# Patient Record
Sex: Male | Born: 1960 | Race: White | Hispanic: No | Marital: Married | Smoking: Never smoker
Health system: Southern US, Community
[De-identification: ages and names within clinical notes are randomized; demographics above are authoritative.]

## PROBLEM LIST (undated history)

## (undated) DIAGNOSIS — M8700 Idiopathic aseptic necrosis of unspecified bone: Secondary | ICD-10-CM

## (undated) DIAGNOSIS — M1990 Unspecified osteoarthritis, unspecified site: Secondary | ICD-10-CM

## (undated) DIAGNOSIS — M47816 Spondylosis without myelopathy or radiculopathy, lumbar region: Secondary | ICD-10-CM

## (undated) DIAGNOSIS — S92002A Unspecified fracture of left calcaneus, initial encounter for closed fracture: Secondary | ICD-10-CM

## (undated) DIAGNOSIS — C801 Malignant (primary) neoplasm, unspecified: Secondary | ICD-10-CM

## (undated) DIAGNOSIS — K56609 Unspecified intestinal obstruction, unspecified as to partial versus complete obstruction: Secondary | ICD-10-CM

## (undated) DIAGNOSIS — K219 Gastro-esophageal reflux disease without esophagitis: Secondary | ICD-10-CM

## (undated) DIAGNOSIS — T7840XA Allergy, unspecified, initial encounter: Secondary | ICD-10-CM

## (undated) HISTORY — PX: VASECTOMY: SHX75

## (undated) HISTORY — PX: HAND SURGERY: SHX662

## (undated) HISTORY — PX: WRIST SURGERY: SHX841

## (undated) HISTORY — DX: Unspecified osteoarthritis, unspecified site: M19.90

## (undated) HISTORY — DX: Allergy, unspecified, initial encounter: T78.40XA

## (undated) HISTORY — PX: FOOT SURGERY: SHX648

---

## 2002-10-10 ENCOUNTER — Encounter: Payer: Self-pay | Admitting: Emergency Medicine

## 2002-10-10 ENCOUNTER — Observation Stay (HOSPITAL_COMMUNITY): Admission: EM | Admit: 2002-10-10 | Discharge: 2002-10-12 | Payer: Self-pay | Admitting: Emergency Medicine

## 2003-09-30 ENCOUNTER — Emergency Department (HOSPITAL_COMMUNITY): Admission: EM | Admit: 2003-09-30 | Discharge: 2003-09-30 | Payer: Self-pay | Admitting: Emergency Medicine

## 2003-11-11 ENCOUNTER — Emergency Department (HOSPITAL_COMMUNITY): Admission: EM | Admit: 2003-11-11 | Discharge: 2003-11-12 | Payer: Self-pay | Admitting: *Deleted

## 2003-11-12 ENCOUNTER — Ambulatory Visit (HOSPITAL_COMMUNITY): Admission: RE | Admit: 2003-11-12 | Discharge: 2003-11-12 | Payer: Self-pay | Admitting: Family Medicine

## 2003-12-03 ENCOUNTER — Inpatient Hospital Stay (HOSPITAL_COMMUNITY): Admission: EM | Admit: 2003-12-03 | Discharge: 2003-12-05 | Payer: Self-pay | Admitting: Internal Medicine

## 2003-12-10 ENCOUNTER — Ambulatory Visit (HOSPITAL_COMMUNITY): Admission: RE | Admit: 2003-12-10 | Discharge: 2003-12-10 | Payer: Self-pay | Admitting: Orthopaedic Surgery

## 2005-07-25 ENCOUNTER — Emergency Department (HOSPITAL_COMMUNITY): Admission: EM | Admit: 2005-07-25 | Discharge: 2005-07-25 | Payer: Self-pay | Admitting: Emergency Medicine

## 2006-05-11 ENCOUNTER — Ambulatory Visit (HOSPITAL_COMMUNITY): Admission: RE | Admit: 2006-05-11 | Discharge: 2006-05-11 | Payer: Self-pay | Admitting: Family Medicine

## 2006-08-20 ENCOUNTER — Inpatient Hospital Stay (HOSPITAL_COMMUNITY): Admission: AD | Admit: 2006-08-20 | Discharge: 2006-08-23 | Payer: Self-pay | Admitting: Family Medicine

## 2007-05-22 ENCOUNTER — Ambulatory Visit (HOSPITAL_COMMUNITY): Admission: RE | Admit: 2007-05-22 | Discharge: 2007-05-22 | Payer: Self-pay | Admitting: Family Medicine

## 2007-07-13 ENCOUNTER — Ambulatory Visit (HOSPITAL_COMMUNITY): Admission: RE | Admit: 2007-07-13 | Discharge: 2007-07-13 | Payer: Self-pay | Admitting: Family Medicine

## 2008-07-03 ENCOUNTER — Ambulatory Visit (HOSPITAL_BASED_OUTPATIENT_CLINIC_OR_DEPARTMENT_OTHER): Admission: RE | Admit: 2008-07-03 | Discharge: 2008-07-04 | Payer: Self-pay | Admitting: *Deleted

## 2009-01-20 ENCOUNTER — Ambulatory Visit (HOSPITAL_COMMUNITY): Admission: RE | Admit: 2009-01-20 | Discharge: 2009-01-20 | Payer: Self-pay | Admitting: Family Medicine

## 2010-01-03 ENCOUNTER — Observation Stay (HOSPITAL_COMMUNITY): Admission: EM | Admit: 2010-01-03 | Discharge: 2010-01-05 | Payer: Self-pay | Admitting: Emergency Medicine

## 2010-06-22 ENCOUNTER — Ambulatory Visit (HOSPITAL_COMMUNITY): Admission: RE | Admit: 2010-06-22 | Discharge: 2010-06-22 | Payer: Self-pay | Admitting: Family Medicine

## 2010-07-06 ENCOUNTER — Ambulatory Visit (HOSPITAL_COMMUNITY): Admission: RE | Admit: 2010-07-06 | Discharge: 2010-07-06 | Payer: Self-pay | Admitting: Family Medicine

## 2010-08-07 ENCOUNTER — Encounter: Admission: RE | Admit: 2010-08-07 | Discharge: 2010-08-07 | Payer: Self-pay | Admitting: Family Medicine

## 2010-09-01 ENCOUNTER — Emergency Department (HOSPITAL_COMMUNITY): Admission: EM | Admit: 2010-09-01 | Discharge: 2010-09-02 | Payer: Self-pay | Admitting: Emergency Medicine

## 2010-09-02 ENCOUNTER — Ambulatory Visit (HOSPITAL_COMMUNITY): Admission: RE | Admit: 2010-09-02 | Discharge: 2010-09-02 | Payer: Self-pay | Admitting: Internal Medicine

## 2010-09-02 ENCOUNTER — Ambulatory Visit: Payer: Self-pay | Admitting: Internal Medicine

## 2010-09-08 ENCOUNTER — Encounter: Payer: Self-pay | Admitting: Internal Medicine

## 2010-09-10 ENCOUNTER — Encounter: Payer: Self-pay | Admitting: Internal Medicine

## 2010-09-11 ENCOUNTER — Encounter: Payer: Self-pay | Admitting: Internal Medicine

## 2010-09-29 ENCOUNTER — Ambulatory Visit (HOSPITAL_COMMUNITY): Admission: RE | Admit: 2010-09-29 | Discharge: 2010-09-29 | Payer: Self-pay | Admitting: Internal Medicine

## 2010-09-29 ENCOUNTER — Ambulatory Visit: Payer: Self-pay | Admitting: Internal Medicine

## 2010-10-04 ENCOUNTER — Encounter: Payer: Self-pay | Admitting: Internal Medicine

## 2011-01-05 NOTE — Letter (Signed)
Summary: Patient Notice, Colon Biopsy Results  [facility]  [address]   [place], [place] [address]   Phone: [phone]  Fax: [phone]       October 04, 2010   [name] [address] [place], [place]  [address] Aug 23, 1961    Dear [name],  I am pleased to inform you that the biopsies taken during your recent colonoscopy did not show any evidence of cancer upon pathologic examination.  Additional information/recommendations:  You should have a repeat colonoscopy examination  in 5 years.  Please call us if you are having persistent problems or have questions about your condition that have not been fully answered at this time.  Sincerely,    R. Roetta Sessions MD, FACP [facility] Ph: [phone]    Fax: [phone]   Appended Document: Patient Notice, Colon Biopsy Results Letter mailed to pt.  Appended Document: Patient Notice, Colon Biopsy Results reminder in computer

## 2011-01-05 NOTE — Letter (Signed)
Summary: TRIAGE ORDER  TRIAGE ORDER   Imported By: Ave Filter 09/11/2010 12:43:33  _____________________________________________________________________  External Attachment:    Type:   Image     Comment:   External Document

## 2011-01-05 NOTE — Letter (Signed)
Summary: Internal Other Domingo Dimes  Internal Other Domingo Dimes   Imported By: Cloria Spring LPN 09/81/1914 78:29:56  _____________________________________________________________________  External Attachment:    Type:   Image     Comment:   External Document

## 2011-01-05 NOTE — Letter (Signed)
Summary: Patient Notice, Endo Biopsy Results  [facility]  [address]   [place], [place] [address]   Phone: [phone]  Fax: [phone]       September 10, 2010   [name] [address] [place], [place]  [address] [birth year]    Dear [name],  I am pleased to inform you that the biopsies taken during your recent endoscopic examination did not show any evidence of cancer, inflammation or infection upon pathologic examination.  Additional information/recommendations:  We will plan for a colonoscopy in the next few weeks.  Please call us if you are having persistent problems or have questions about your condition that have not been fully answered at this time.  Sincerely,    R. Roetta Sessions MD, FACP [facility] Ph: [phone]   Fax: [phone]   Appended Document: Patient Notice, Endo Biopsy Results Lm for pt to call back to schedule tcs.

## 2011-02-18 LAB — URINALYSIS, ROUTINE W REFLEX MICROSCOPIC
Nitrite: NEGATIVE
Specific Gravity, Urine: 1.02 (ref 1.005–1.030)
Urobilinogen, UA: 0.2 mg/dL (ref 0.0–1.0)

## 2011-02-18 LAB — URINE MICROSCOPIC-ADD ON

## 2011-02-18 LAB — BASIC METABOLIC PANEL
BUN: 10 mg/dL (ref 6–23)
CO2: 29 mEq/L (ref 19–32)
Calcium: 9.2 mg/dL (ref 8.4–10.5)
Chloride: 102 mEq/L (ref 96–112)
Creatinine, Ser: 0.94 mg/dL (ref 0.4–1.5)
GFR calc Af Amer: >60 mL/min (ref >60)
GFR calc non Af Amer: >60 mL/min (ref >60)
Glucose, Bld: 143 mg/dL — ABNORMAL HIGH (ref 70–99)
Potassium: 2.8 mEq/L — ABNORMAL LOW (ref 3.5–5.1)
Sodium: 140 mEq/L (ref 135–145)

## 2011-02-18 LAB — CBC
HCT: 49.6 % (ref 39.0–52.0)
Hemoglobin: 17 g/dL (ref 13.0–17.0)
MCH: 33.7 pg (ref 26.0–34.0)
MCHC: 34.2 g/dL (ref 30.0–36.0)
MCV: 98.4 fL (ref 78.0–100.0)
Platelets: 266 10*3/uL (ref 150–400)
RBC: 5.04 MIL/uL (ref 4.22–5.81)
RDW: 12.8 % (ref 11.5–15.5)
WBC: 11.5 10*3/uL — ABNORMAL HIGH (ref 4.0–10.5)

## 2011-02-18 LAB — DIFFERENTIAL
Basophils Absolute: 0.1 10*3/uL (ref 0.0–0.1)
Lymphocytes Relative: 13 % (ref 12–46)
Neutro Abs: 9.1 10*3/uL — ABNORMAL HIGH (ref 1.7–7.7)

## 2011-02-18 LAB — LIPASE, BLOOD: Lipase: 28 U/L (ref 11–59)

## 2011-02-21 LAB — DIFFERENTIAL
Basophils Absolute: 0.1 10*3/uL (ref 0.0–0.1)
Eosinophils Relative: 1 % (ref 0–5)
Lymphocytes Relative: 24 % (ref 12–46)

## 2011-02-21 LAB — CBC
HCT: 46.9 % (ref 39.0–52.0)
Platelets: 286 10*3/uL (ref 150–400)
RDW: 12.6 % (ref 11.5–15.5)

## 2011-02-21 LAB — BASIC METABOLIC PANEL
BUN: 11 mg/dL (ref 6–23)
Calcium: 9.3 mg/dL (ref 8.4–10.5)
GFR calc non Af Amer: >60 mL/min (ref >60)
Glucose, Bld: 108 mg/dL — ABNORMAL HIGH (ref 70–99)
Potassium: 4.2 mEq/L (ref 3.5–5.1)

## 2011-04-20 NOTE — Op Note (Signed)
NAME:  KIREN, MCISAAC NO.:  [id number]   MEDICAL RECORD NO.:  [id number]          PATIENT TYPE:  AMB   LOCATION:  DSC                          FACILITY:  [facility]   PHYSICIAN:  Tennis Must Meyerdierks, M.D.DATE OF BIRTH:  1961/08/26   DATE OF PROCEDURE:  07/03/2008  DATE OF DISCHARGE:                               OPERATIVE REPORT   PREOPERATIVE DIAGNOSIS:  Fracture, right distal radius.   POSTOPERATIVE DIAGNOSIS:  Fracture, right distal radius.   PROCEDURE:  Open reduction and internal fixation, right distal radius  fracture.   SURGEON:  Lowell Bouton, MD.   ANESTHESIA:  Axillary block.   OPERATIVE FINDINGS:  The patient had a comminuted distal radius fracture  that did not appear to extend into the joint.   PROCEDURE:  Under axillary block anesthesia with a tourniquet on the  right arm, the right hand was prepped and draped in the usual fashion.  After  exsanguinating the limb, the tourniquet was inflated to 250 mmHg.  Longitudinal traction of 10 pounds was applied across the end of the arm  board using finger traps on the index and long fingers.  A longitudinal  incision was made over the volar aspect of the distal forearm in line  with the FCR tendon.  It was carried down through the subcutaneous  tissues, and bleeding points were coagulated.  Sharp dissection was  carried down to the FCR sheath and this was incised with a scissors.  The FCR tendon was then retracted ulnarly, and the floor of the sheath  was incised with the scissors.  Blunt dissection was carried down just  radial to the FPL and this was retracted ulnarly.  The pronator  quadratus was identified and was sharply released from its attachment to  the radius.  A Freer elevator was used to elevate the pronator quadratus  and to identify the fracture site.  Weitlaner retractors were inserted.  Care was taken to protect the median nerve.  The fracture site was then  explored, and  there was muscle in the fracture site.  This was removed  with a rongeur.  A Freer elevator was used to elevate the fracture site  and cleaned it out with irrigation.  The fracture was then reduced and  held in position with the elevator while a DVR plate, medium length was  inserted.  A right-sided plate was placed and the 3.5 mm sliding screw  was inserted.  X-rays showed good position of the plate and the  fracture.  Multiple threaded and non-threaded pegs were inserted  distally using a total of 7 pegs.  Another cortical screw was placed  proximally and did not have good cortical fixation dorsally, so it was  removed.  Two more screws were inserted proximally, so there were 3  cortical screws in the longitudinal portion of the plate and 7 pegs in  the transverse buttress portion of the plate.  X-rays showed good  alignment and traction had been released prior to inserting all the  screws.  The wound was irrigated copiously with saline.  The pronator  quadratus  was reattached with 4-0 Vicryl.  A TLS drain was inserted for  drainage.  Subcutaneous tissue was closed with 4-0 Vicryl.  Skin was closed with 3-  0 subcuticular Prolene.  Steri-Strips were applied followed by sterile  dressings and volar wrist splint.  Tourniquet was released with good  circulation of the hand.  The patient went to the recovery room awake  and stable and in good condition.      Lowell Bouton, M.D.  Electronically Signed     EMM/MEDQ  D:  07/03/2008  T:  07/04/2008  Job:  [id number]   cc:   Teola Bradley, M.D.

## 2011-04-23 NOTE — H&P (Signed)
NAME:  [name], [name]                         ACCOUNT NO.:  [id number]   MEDICAL RECORD NO.:  [id number]                   PATIENT TYPE:  EMS   LOCATION:  ED                                   FACILITY:  [facility]   PHYSICIAN:  Burnice Logan, M.D.               DATE OF BIRTH:  01/11/1961   DATE OF ADMISSION:  10/10/2002  DATE OF DISCHARGE:                                HISTORY & PHYSICAL   CHIEF COMPLAINT:  Chest pain.   HISTORY OF PRESENT ILLNESS:  This is a 50 year old white gentleman who  started experiencing precordial chest pain at about 12 noon today.  The pain  was associated with shortness of breath and diaphoresis.  The pain lingered  until about 9 p.m. or 10 p.m. this evening.  The paramedics were called to  bring him to the emergency room and he received nitro in the ambulance en  route to the emergency room.  In the emergency room the patient was given a  shot of morphine and shortly after was given a dose of Benadryl because he  reported previous allergic reaction to MORPHINE.  He currently is pain free  and has no complaints.   PAST MEDICAL HISTORY:  Gastroesophageal reflux disease.   CURRENT MEDICATIONS:  Prevacid 30 mg p.o. q.d.   MEDICATION ALLERGIES:  MORPHINE and IBUPROFEN.  He breaks out in hives when  he takes these medications.   FAMILY HISTORY:  The patient's sister is due for open heart surgery.  His  daddy has a heart murmur.  There are no premature deaths from coronary  artery disease in the family.   SOCIAL HISTORY:  The patient is single and works in a [employer].  He does  not smoke and does not drink alcohol.   REVIEW OF SYSTEMS:  The patient stated that he has been well and healthy and  has not seen his primary doctor in a while.  He does not know his  cholesterol status.  He denies any previous surgeries.  He has had no fever  or chills.  Denies any weight loss.  NEUROLOGIC:  Has no headaches, no  visual disturbances.  ENT:  No sore throat,  no earache.  CARDIORESPIRATORY:  Chest pain as stated above.  The patient also has occasional nonproductive  cough.  GASTROINTESTINAL:  No abdominal pain, nausea, or vomiting.  GENITOURINARY:  No dysuria or hematuria.  ENDOCRINE:  Negative for diabetes  mellitus.  MUSCULOSKELETAL:  No complaints.  All other systems reviewed are  negative.   PHYSICAL EXAMINATION:  GENERAL:  This is a young white gentleman of average  build in no acute distress.  VITAL SIGNS:  Temperature 98.2, blood pressure 119/70, heart rate 89 beats  per minute, respiratory rate 20 per minute, O2 saturation 97% on room air.  HEENT:  Normocephalic and atraumatic.  Pupils are round and equal in size,  reactive to light.  Oropharynx exam is unremarkable.  NECK:  Supple.  No distended veins.  LUNGS:  Clear to auscultation.  Breathing is unlabored.  CARDIOVASCULAR:  Heart sounds 1 and 2 regular.  ABDOMEN:  Soft and nontender, normal bowel sounds.  NEUROLOGIC:  No focal deficits.  Cranial nerves II-XII are normal.  EXTREMITIES:  No pedal edema.   LABORATORY DATA:  EKG:  Sinus rhythm at 87 beats per minute; no ST segment  changes.   CK 64, MB fraction 1.6, troponin less than 0.01.  Hemoglobin 15.8,  hematocrit 44.6.  Potassium 3.2, BUN 6, creatinine 0.9.   ASSESSMENT AND PLAN:  Chest pain.  The patient will be admitted to the  telemetry unit for observation.  He will have serial cardiac enzymes done.  Will consult cardiology to schedule a stress test for tomorrow morning.  The  patient will have a fasting lipid panel checked and he will get potassium  supplementation for his mild hypokalemia.   Dr. Renard Matter will assume care of the patient in the morning.                                                Burnice Logan, M.D.    ES/MEDQ  D:  10/11/2002  T:  10/11/2002  Job:  [id number]

## 2011-04-23 NOTE — Discharge Summary (Signed)
NAME:  OLSEN, MCCUTCHAN               ACCOUNT NO.:  [id number]   MEDICAL RECORD NO.:  [id number]          PATIENT TYPE:  INP   LOCATION:  A330                          FACILITY:  [facility]   PHYSICIAN:  Angus G. Renard Matter, MD   DATE OF BIRTH:  1961/05/18   DATE OF ADMISSION:  08/19/2006  DATE OF DISCHARGE:  09/18/2007LH                                 DISCHARGE SUMMARY   DIAGNOSIS:  Small bowel obstruction, improved at the time of discharge.   This 50 year old white male presented to the office on the day of admission  with chief complaint being severe epigastric and upper abdominal pain  accompanied by episodes of vomiting and nausea.  He stated this had been  present since the previous evening and was rated at 8 to 10 in severity.  He  does have a history of hiatal hernia. He was given Mepergan in the office 2  mL and admitted to the hospital for further evaluation.   PHYSICAL EXAMINATION:  An uncomfortable white male complaining of abdominal  pain.  VITAL SIGNS:  Blood pressure 120/80, pulse 60, respirations 18, temperature  98.  HEENT: Eyes: PERRLA.  TM's negative.  Oropharynx benign.  NECK:  Supple.  No JVD or thyroid abnormalities.  HEART:  Regular rhythm.  No murmurs.  LUNGS:  Clear to P&A.  ABDOMEN:  Tenderness in epigastrium.  No palpable organs or masses.  EXTREMITIES:  Free of edema.   LABORATORY DATA:  Admission CBC; WBC 13,600 with hemoglobin 17.4, crit 50.3.  Subsequent CBC on August 21, 2006; WBC 11,100 with hemoglobin 16.8,  hematocrit 47.7.  Chemistries; sodium 138, potassium 3.7, chloride 100, CO2  28, glucose 107, BUN 10, creatinine 0.8, calcium 9.0.  Subsequent  chemistries on August 21, 2006; sodium 140, potassium 4.3, chloride 104,  CO2 31, glucose 111, BUN 10, creatinine one, calcium 9.0.  Liver enzymes;  SGOT 28, SGPT 48, alkaline phosphatase 74, bilirubin 1.3, amylase 60, lipase  17.  Urinalysis negative.   X-RAYS:  CT of pelvis:  Distended loops of small  bowel in the upper pelvis  compatible with small-bowel obstruction.  Chest x-ray and abdominal film;  atelectasis and scarring of right lung, chronic bronchitic changes, air  filled loops of small bowel in mid abdomen.  Electrocardiogram; normal sinus  rhythm, normal EKG.   HOSPITAL COURSE:  The patient at the time of his admission and was  complaining of severe epigastric pain.  He had x-ray evidence of small-bowel  obstruction.  He was placed on intravenous fluids, half-normal saline at 100  mL an hour.  Vital signs were monitored q.i.d. [facility] standing orders. IV  Protonix was started 40 mg every 12 hours.  He was given IV Dilaudid 1-2 mg  every 3-4 hours p.r.n. for pain, IV Zofran 4 mg every 8 hours p.r.n. for  nausea and started on Levbid one b.i.d.  He was seen in consultation by Dr.  Lovell Sheehan. NG tube was inserted to moderate suction.  Levbid was discontinued.  The patient gradually improved with conservative treatment.  Ice chips were  started.  His diet was progressed.  His x-rays improved and the patient  improved.  His diet was advanced.  Dr. Lovell Sheehan did not  feel that he needed surgical intervention.  The patient gradually improved  during his hospital stay which was 3 days and he was able be discharged on  the third day to be followed as an outpatient.  He was instructed to  continue Prevacid 30 mg b.i.d. and Maalox 1 tablespoon p.r.n. and instructed  to return to the physician's office for follow-up.      Angus G. Renard Matter, MD  Electronically Signed     AGM/MEDQ  D:  09/26/2006  T:  09/26/2006  Job:  [id number]

## 2011-04-23 NOTE — Group Therapy Note (Signed)
NAME:  [name], [name]               ACCOUNT NO.:  [id number]   MEDICAL RECORD NO.:  [id number]          PATIENT TYPE:  INP   LOCATION:  A330                          FACILITY:  [facility]   PHYSICIAN:  Angus G. Renard Matter, MD   DATE OF BIRTH:  1961/05/28   DATE OF PROCEDURE:  DATE OF DISCHARGE:                                   PROGRESS NOTE   This patient was admitted with severe epigastric and upper abdominal pain.  He had evidence of small bowel obstruction by CT with distended loops of  small bowel at upper pelvis.  Nondistended colon and lower small bowel.  He  remains on IV fluids.  He remains on NG suction.   OBJECTIVE:  VITAL SIGNS:  Blood pressure 124/82, respirations 20, pulse 88,  temp 98;.2.  Lab shows white count 11,100.  Chemistry is sodium 140, potassium 4.3,  chloride 104, glucose 111, BUN 10, creatinine 1.  LUNGS:  Clear.  HEART:  Regular rhythm.  ABDOMEN:  Slight tenderness in upper abdomen.   ASSESSMENT:  Patient was admitted with a small bowel obstruction.  Plan to  continue current regimen.  Patient will be seen again today by surgery, Dr.  Lovell Sheehan, who will decided if NG suction should be discontinued.      Angus G. Renard Matter, MD  Electronically Signed     AGM/MEDQ  D:  08/21/2006  T:  08/22/2006  Job:  [id number]

## 2011-04-23 NOTE — Group Therapy Note (Signed)
   NAME:  [name], [name] NO.:  [id number]   MEDICAL RECORD NO.:  [id number]                  PATIENT TYPE:   LOCATION:                                       FACILITY:   PHYSICIAN:  Angus G. Renard Matter, M.D.              DATE OF BIRTH:   DATE OF PROCEDURE:  10/11/2002  DATE OF DISCHARGE:                                   PROGRESS NOTE   SUBJECTIVE:  This patient was admitted with left chest pain.  His cardiac  enzymes today have been normal.   OBJECTIVE:  VITAL SIGNS: Blood pressure 110/59, respirations 24, pulse 73,  temperature 98.  LUNGS: Clear to percussion and auscultation.  HEART:  Regular rhythm.  ABDOMEN: No palpable organs or masses.   ASSESSMENT:  The patient was admitted with left chest pain, to rule out  coronary artery disease.   PLAN:  Plan to obtain cardiology consult today for possible stress test.                                               Angus G. Renard Matter, M.D.    AGM/MEDQ  D:  10/11/2002  T:  10/12/2002  Job:  [id number]

## 2011-04-23 NOTE — Group Therapy Note (Signed)
   NAME:  [name], [name]                           ACCOUNT NO.:  [id number]   MEDICAL RECORD NO.:  [id number]                  PATIENT TYPE:   LOCATION:                                       FACILITY:   PHYSICIAN:  Angus G. Renard Matter, M.D.              DATE OF BIRTH:   DATE OF PROCEDURE:  10/12/2002  DATE OF DISCHARGE:                                   PROGRESS NOTE   SUBJECTIVE:  This patient was admitted with chest pain.  His cardiac  enzymes, CPK, CPK/MB, and troponin remain normal.   OBJECTIVE:  VITAL SIGNS: Blood pressure 114/73, respirations 20, pulse 68,  temperature 97.4.  LUNGS: Clear to percussion and auscultation. HEART:  Regular rhythm.   ASSESSMENT:  The patient was admitted with chest pain to rule out coronary  artery disease.   PLAN:  Plan is to proceed with stress test today.                                               Angus G. Renard Matter, M.D.    AGM/MEDQ  D:  10/12/2002  T:  10/12/2002  Job:  [id number]

## 2011-04-23 NOTE — Procedures (Signed)
   NAME:  [name], [name]                         ACCOUNT NO.:  [id number]   MEDICAL RECORD NO.:  [id number]                   PATIENT TYPE:  OBV   LOCATION:  A202                                 FACILITY:  [facility]   PHYSICIAN:  [place] Bing, M.D. [facility]           DATE OF BIRTH:  1961/03/25   DATE OF PROCEDURE:  10/12/2002  DATE OF DISCHARGE:                                    STRESS TEST   EXERCISE CARDIOLITE:   BRIEF HISTORY:  The patient is a pleasant 50 year old male who was admitted  to [facility] on October 10, 2002 for evaluation of chest pain.  He  had had prolonged chest pain prior to admission however, his cardiac enzymes  were negative.  He has a history of gastroesophageal reflux disease but  otherwise has been healthy.  He was noted to be hypokalemic at time of  admission; this was supplemented and resolved.  The patient was scheduled  for an exercise Cardiolite to further evaluate his symptoms.   Prior to the study, the patient had no complaints.  His baseline EKG showed  normal sinus rhythm, rate 72 beats per minute without any abnormal changes,  his blood pressure was 112/74, his target heart rate was 153 beats per  minute.   The patient was able to exercise 12 minutes reaching a maximum heart rate of  188 beats per minute.  He was injected with the Cardiolite at 5 minutes and  10 seconds into the study at which time his heart rate was 152.   The patient experienced no chest pain.  He did have some shortness of  breath.  There were no EKG changes.  He had excellent exercise tolerance.  The final images are pending at time of this dictation.     Delton See, P.A. [facility]                  [place] Bing, M.D. [facility]    DR/MEDQ  D:  10/12/2002  T:  10/13/2002  Job:  [id number]   cc:   Angus G. Renard Matter, M.D.

## 2011-04-23 NOTE — Discharge Summary (Signed)
NAMEESTEN, DOLLAR                         ACCOUNT NO.:  [id number]   MEDICAL RECORD NO.:  [id number]                   PATIENT TYPE:  INP   LOCATION:  A339                                 FACILITY:  [facility]   PHYSICIAN:  Candace Cruise, P.A.                  DATE OF BIRTH:  1961/08/23   DATE OF ADMISSION:  12/03/2003  DATE OF DISCHARGE:  12/05/2003                                 DISCHARGE SUMMARY   DISCHARGE DIAGNOSIS:  Compression fracture of L1, acute.   OPERATION:  None.   DISPOSITION:  The patient was discharged home, is to follow up in the office  on December 09, 2003, at 9 a.m., x-rays of his back.   CONDITION ON DISCHARGE:  Improved.   PROGNOSIS:  Good.   DISCHARGE MEDICATIONS:  Tylox one tablet q.4h. p.r.n. pain.   BRIEF HISTORY AND PHYSICAL:  Please refer to the type written History and  Physical on patient's chart.   HOSPITAL COURSE:  The patient was admitted to this institution on December 03, 2003, after falling some 15 feet and injuring his back.  Initially, it  was felt that he did not have an acute fracture by the CT of the lumbar  spine.  However, a bone scan was ordered, and it did show an acute fracture  of L1.  He was treated by means of physical therapy and pain control.  He  was placed in a CASH brace and given instructions how to use this.   While hospitalized, he remained afebrile in stable condition.  As his pain  decreased, he was then discharged home on December 05, 2003, afebrile, in  satisfactory condition.   DISCHARGE INSTRUCTIONS:  He is to continue the CASH brace while up and  about.  No bending or stooping.  Call with any neurovascular changes of his  lower extremities.     ___________________________________________                                         Candace Cruise, P.A.   BB/MEDQ  D:  12/19/2003  T:  12/19/2003  Job:  [id number]

## 2011-04-23 NOTE — Group Therapy Note (Signed)
NAME:  [name], [name]               ACCOUNT NO.:  [id number]   MEDICAL RECORD NO.:  [id number]          PATIENT TYPE:  INP   LOCATION:  A330                          FACILITY:  [facility]   PHYSICIAN:  Angus G. Renard Matter, MD   DATE OF BIRTH:  Sep 21, 1961   DATE OF PROCEDURE:  08/22/2006  DATE OF DISCHARGE:                                   PROGRESS NOTE   This patient was admitted to the hospital with moderate upper abdominal  pain.  On CT she was noted to have evidence of small-bowel obstruction.  She  is being seen by Dr. Lovell Sheehan.  His NG tube was removed and he was placed on  a full-liquid diet.   OBJECTIVE:  VITAL SIGNS:  Blood pressure 103/64, respirations 20, pulse 90,  temperature 98.5. The patient continues to have cramping intermittent  abdominal pain.  He is tolerating the liquids in a satisfactory manner.  His  chemistries remain normal.  LUNGS:  Clear. ABDOMEN:  No palpable organs.  HEART:  Regular rhythm.   ASSESSMENT:  The patient was admitted with abdominal pain, partial small  bowel obstruction.   PLAN:  To continue current regimen.  Will obtain a KUB and erect.      Angus G. Renard Matter, MD  Electronically Signed     AGM/MEDQ  D:  08/22/2006  T:  08/22/2006  Job:  [id number]

## 2011-04-23 NOTE — Discharge Summary (Signed)
NAME:  [name], [name]                         ACCOUNT NO.:  [id number]   MEDICAL RECORD NO.:  [id number]                   PATIENT TYPE:  OBV   LOCATION:  A202                                 FACILITY:  [facility]   PHYSICIAN:  Angus G. Renard Matter, M.D.              DATE OF BIRTH:  08-29-1961   DATE OF ADMISSION:  10/10/2002  DATE OF DISCHARGE:  10/12/2002                                 DISCHARGE SUMMARY   DIAGNOSES:  1. Precordial chest pain.  2. Bronchial asthma.   CONDITION ON DISCHARGE:  Condition stable and improved at time of discharge.   SUBJECTIVE:  A 50 year old male developed precordial chest pain associated  with shortness of breath and diaphoresis.  Paramedics were called.  He was  brought to the emergency room and received nitroglycerin en route.  Given  morphine in the emergency room.   OBJECTIVE:  VITAL SIGNS:  Blood pressure 119/70, pulse 89, respirations 20.  HEENT:  Eyes:  PERRLA.  TMs negative.  Oropharynx benign.  NECK:  Supple.  No JVD or thyroid abnormality.  LUNGS:  Clear to P&A.  HEART:  Regular rhythm.  ABDOMEN:  No palpable organs or masses.  NEUROLOGIC:  No focal deficits.  EXTREMITIES:  Free of edema.   LABORATORY DATA:  Admission CBC:  WBC 9300, hemoglobin 15.8, hematocrit  44.6.  Chemistries:  Sodium 138, potassium 3.2, chloride 107, CO2 30,  glucose 117, BUN 6, creatinine 0.9, calcium 8.7, protein 5.9, albumin 3.4.  Subsequent chemistries on October 12, 2002:  Sodium 133, potassium 3.6,  chloride 101, CO2 26.  Liver enzymes:  SGOT 19, SGPT 26, alkaline  phosphatase 73, bilirubin 0.6.  CPK on admission 64, CPK-MB 1.6, troponin  less than 0.01.  Subsequent CPK October 11, 2002:  CK 51, CK-MB 1.  Lipid  profile:  Cholesterol 177, triglycerides 133, HDL 34, LDL cholesterol 116.  X-ray of chest:  No acute abnormality.  Stress Cardiolite negative for  ischemia or infarction.   HOSPITAL COURSE:  The patient at the time of admission was brought to the  emergency room, given nitroglycerin en route.  Was admitted with chest pain  to rule out coronary artery disease.  His cardiac enzymes remained normal.  He was seen in consultation by [facility] who doubted that there was  coronary artery disease.  His cardiac stress test subsequently was negative.  The patient was placed on aspirin 325 mg daily, K-Dur 20 mEq.  Showed  progressive improvement.  Was able to be discharged at the end of two days'  hospitalization to follow as an outpatient.  Home medicines:  Aspirin 81 mg  daily.                                              Angus G. McInnis,  M.D.   AGM/MEDQ  D:  12/04/2002  T:  12/04/2002  Job:  [id number]

## 2011-04-23 NOTE — Group Therapy Note (Signed)
NAME:  [name], [name]               ACCOUNT NO.:  [id number]   MEDICAL RECORD NO.:  [id number]          PATIENT TYPE:  INP   LOCATION:  A330                          FACILITY:  [facility]   PHYSICIAN:  Angus G. Renard Matter, MD   DATE OF BIRTH:  10-15-1961   DATE OF PROCEDURE:  DATE OF DISCHARGE:                                   PROGRESS NOTE   This patient was admitted with severe epigastric pain and nausea.  He had an  abdominal CT which showed evidence of small bowel obstruction with mild to  moderately distended loops of small bowel in the upper abdomen compared to  normal caliber of small bowel loops in the pelvis.  The patient was put on  intravenous fluids and NG suction and is more comfortable.   OBJECTIVE:  VITAL SIGNS:  Blood pressure 118/73, respirations 20, pulse 94,  temperature 97.5.  LUNGS:  Clear.  ABDOMEN:  Some slight tenderness in upper abdomen.  HEART:  Regular rhythm.   LABORATORY DATA:  Lab data shows a white count 13,600, 91 neutrophils.  Chemistries remain normal with the exception of slightly elevated glucose of  107.  SGOT 48, bilirubin 1.3.   ASSESSMENT:  The patient admitted with upper abdominal pain, probable small  bowel obstruction.   PLAN:  Plan to continue current IV fluids.  Will monitor electrolytes,  continue NG suction.  The patient will be seen today by Dr. Lovell Sheehan as a  surgical consult.      Angus G. Renard Matter, MD  Electronically Signed     AGM/MEDQ  D:  08/20/2006  T:  08/22/2006  Job:  [id number]

## 2011-04-23 NOTE — H&P (Signed)
NAME:  [name], [name] NO.:  [id number]   MEDICAL RECORD NO.:  [id number]          PATIENT TYPE:   LOCATION:                                 FACILITY:   PHYSICIAN:  Angus G. Renard Matter, MD   DATE OF BIRTH:  [birth year]   DATE OF ADMISSION:  08/19/2006  DATE OF DISCHARGE:  LH                                HISTORY & PHYSICAL   HISTORY:  This 50 year old white male presented to the office on day of  admission with the chief complaint being severe epigastric and upper  abdominal pain accompanied by episodes of vomiting and nausea.  He states  that the pain had been present since last evening and was rated at 8-10 in  severity.  The patient states he has a history of hiatal hernia.  He was  given Mepergan 2 mL in the office; and subsequently admitted to observation  at [facility].   SOCIAL HISTORY:  The patient does not smoke.  He drinks occasionally  socially.   FAMILY HISTORY:  Positive for heart disease.  No diabetes.   PAST MEDICAL HISTORY:  1. The patient has history of hiatal hernia.  2. He was admitted several years ago to rule out coronary artery disease.   ALLERGIES:  He has no known allergies   MEDICATIONS:  He takes no medication on a regular basis.   REVIEW OF SYSTEMS:  HEENT: Negative.  CARDIOPULMONARY: No cough or dyspnea.  GI: Bouts of vomiting and nausea, epigastric pain, burning type pain which  radiates posteriorly.  GU: No dysuria, hematuria.   PHYSICAL EXAMINATION:  GENERAL:  An uncomfortable white male complaining of  abdominal pain.  VITAL SIGNS:  Blood pressure 120/80, pulse 60, respirations 18, temperature  98.  HEENT:  Eyes: PERRLA.  TMs negative.  Oropharynx benign.  NECK:  Supple.  No JVD or thyroid abnormalities.  HEART:  Regular rhythm.  No murmurs.  LUNGS:  Clear to P&A.  ABDOMEN:  Tenderness in the epigastrium.  No palpable organs or masses.  GENITALIA:  Normal  EXTREMITIES:  Free of edema.  NEUROLOGIC:  No focal  deficit.   DIAGNOSIS:  Epigastric pain of undetermined etiology.  Possible acute peptic  ulcer versus acute cholecystitis.   PLAN:  To admit the patient to the hospital for intravenous fluids, IV  medication for pain; and will obtain further lab work, CBC, amylase, lipase  and BMET, and also will obtain CT of the abdomen.      Angus G. Renard Matter, MD  Electronically Signed     AGM/MEDQ  D:  08/19/2006  T:  08/19/2006  Job:  [id number]

## 2011-04-23 NOTE — H&P (Signed)
[name], PARENTEAU                         ACCOUNT NO.:  [id number]   MEDICAL RECORD NO.:  [id number]                   PATIENT TYPE:  INP   LOCATION:  A339                                 FACILITY:  [facility]   PHYSICIAN:  J. Darreld Mclean, M.D.              DATE OF BIRTH:  01/04/1961   DATE OF ADMISSION:  12/03/2003  DATE OF DISCHARGE:                                HISTORY & PHYSICAL   CHIEF COMPLAINT:  I fell and hurt my back.   HISTORY OF PRESENT ILLNESS:  The patient fell over 15 feet and hurt his back  tonight.  Landed on his heels.  He has significant back pain.  X-rays show a  compression fracture of L1.  No other injuries sustained.  CT scan is  pending.   PAST MEDICAL HISTORY:  None.  Denies any other serious medical problems.   PAST SURGICAL HISTORY:  None.   ALLERGIES:  IBUPROFEN makes his eyes go shut and MORPHINE for which he has  difficulty breathing.   CURRENT MEDICATIONS:  Prevacid.   SOCIAL HISTORY:  Patient lives in [place] and is accompanied tonight by  his father and his uncle.  His uncle saw him fall.   PHYSICAL EXAMINATION:  VITAL SIGNS:  Normal limits.  HEENT:  Negative.  NECK:  Full range of motion.  CHEST:  Clear.  HEART:  Regular rhythm, no murmur.  ABDOMEN:  Soft, no masses.  BACK:  Some tenderness in the midback around L1.  No spasm but he is tight,  very painful, very tender.  NEUROLOGIC:  Intact.  Reflexes normal.  Sensation intact.  CNS intact.  SKIN:  Intact.  EXTREMITIES:  Negative.  There is no tattoo on his left arm.   IMPRESSION:  Compression fracture, L1.   PLAN:  Bed rest, clear liquids, IV and then cast brace in the morning,  physical therapy.  The patient advanced to a regular diet assuming he does  not develop an ileus.  Pain control.    ___________________________________________                                         Teola Bradley, M.D.   JWK/MEDQ  D:  12/03/2003  T:  12/03/2003  Job:  [id number]

## 2011-09-03 LAB — POCT HEMOGLOBIN-HEMACUE: Hemoglobin: 17.2 — ABNORMAL HIGH

## 2012-06-30 ENCOUNTER — Emergency Department (HOSPITAL_COMMUNITY)
Admission: EM | Admit: 2012-06-30 | Discharge: 2012-07-01 | Disposition: A | Discharge status: Home / Self Care | Payer: BC Managed Care – PPO | Attending: Emergency Medicine | Admitting: Emergency Medicine

## 2012-06-30 ENCOUNTER — Emergency Department (HOSPITAL_COMMUNITY): Payer: BC Managed Care – PPO

## 2012-06-30 ENCOUNTER — Encounter (HOSPITAL_COMMUNITY): Payer: Self-pay | Admitting: Emergency Medicine

## 2012-06-30 DIAGNOSIS — R209 Unspecified disturbances of skin sensation: Secondary | ICD-10-CM | POA: Insufficient documentation

## 2012-06-30 DIAGNOSIS — IMO0002 Reserved for concepts with insufficient information to code with codable children: Secondary | ICD-10-CM | POA: Insufficient documentation

## 2012-06-30 DIAGNOSIS — K219 Gastro-esophageal reflux disease without esophagitis: Secondary | ICD-10-CM | POA: Insufficient documentation

## 2012-06-30 DIAGNOSIS — M542 Cervicalgia: Secondary | ICD-10-CM | POA: Insufficient documentation

## 2012-06-30 HISTORY — DX: Gastro-esophageal reflux disease without esophagitis: K21.9

## 2012-06-30 MED ORDER — METHYLPREDNISOLONE SODIUM SUCC 125 MG IJ SOLR
125.0000 mg | Freq: Once | INTRAMUSCULAR | Status: AC
  Administered 2012-07-01: 125 mg via INTRAVENOUS
  Filled 2012-06-30: qty 2

## 2012-06-30 NOTE — ED Notes (Signed)
Patient complaining of neck pain with history of same. States has been evaluated and diagnosed with a degenerating disc. States at times his hand or feet will go numb.

## 2012-07-01 MED ORDER — LORAZEPAM 1 MG PO TABS: ORAL_TABLET | ORAL | Status: DC

## 2012-07-01 MED ORDER — PREDNISONE 10 MG PO TABS: 20.0000 mg | ORAL_TABLET | Freq: Every day | ORAL | Status: DC

## 2012-07-01 NOTE — ED Notes (Signed)
Discharge instructions given and reviewed with patient.  Prescriptions given for Prednisone and Ativan x1 (for MRI on Monday); effects and use explained.  Patient verbalized understanding to take medication as prescribed and to return on Monday for MRI.  Patient given time for MRI by radiology tech.  Patient ambulatory; discharged home in good condition.  Friend accompanied discharge to drive home.

## 2012-07-01 NOTE — ED Provider Notes (Signed)
History     CSN: [id number]  Arrival date & time 06/30/12  2227   First MD Initiated Contact with Patient 06/30/12 2341      Chief Complaint  Patient presents with  . Neck Pain    (Consider location/radiation/quality/duration/timing/severity/associated sxs/prior treatment) Patient is a 51 y.o. male presenting with neck pain. The history is provided by the patient (the pt complains of neck pain and numbness in left foot and shoulder). No language interpreter was used.  Neck Pain  This is a new problem. The current episode started 3 to 5 hours ago. The problem occurs constantly. The problem has not changed since onset.The pain is associated with nothing. There has been no fever. The pain is present in the generalized neck. The quality of the pain is described as stabbing. The pain radiates to the left shoulder. The pain is at a severity of 2/10. The pain is mild. Exacerbated by: nothing. Associated symptoms include numbness. Pertinent negatives include no chest pain and no headaches.    Past Medical History  Diagnosis Date  . Degenerative disc disease   . GERD (gastroesophageal reflux disease)     Past Surgical History  Procedure Date  . Wrist surgery     History reviewed. No pertinent family history.  History  Substance Use Topics  . Smoking status: Never Smoker   . Smokeless tobacco: Not on file  . Alcohol Use: No      Review of Systems  Constitutional: Negative for fatigue.  HENT: Positive for neck pain. Negative for congestion, sinus pressure and ear discharge.   Eyes: Negative for discharge.  Respiratory: Negative for cough.   Cardiovascular: Negative for chest pain.  Gastrointestinal: Negative for abdominal pain and diarrhea.  Genitourinary: Negative for frequency and hematuria.  Musculoskeletal: Negative for back pain.  Skin: Negative for rash.  Neurological: Positive for numbness. Negative for seizures and headaches.  Hematological: Negative.     Psychiatric/Behavioral: Negative for hallucinations.    Allergies  Ibuprofen and Morphine and related  Home Medications   Current Outpatient Rx  Name Route Sig Dispense Refill  . PREDNISONE 10 MG PO TABS Oral Take 2 tablets (20 mg total) by mouth daily. 15 tablet 0    BP 138/87  Pulse 92  Temp 97.7 F (36.5 C) (Oral)  Resp 14  Ht 5\' 5"  (1.651 m)  Wt 180 lb (81.647 kg)  BMI 29.95 kg/m2  SpO2 98%  Physical Exam  Constitutional: He is oriented to person, place, and time. He appears well-developed.  HENT:  Head: Normocephalic and atraumatic.  Eyes: Conjunctivae and EOM are normal. No scleral icterus.  Neck: Neck supple. No thyromegaly present.  Cardiovascular: Normal rate and regular rhythm.  Exam reveals no gallop and no friction rub.   No murmur heard. Pulmonary/Chest: No stridor. He has no wheezes. He has no rales. He exhibits no tenderness.  Abdominal: He exhibits no distension. There is no tenderness. There is no rebound.  Musculoskeletal: Normal range of motion. He exhibits no edema.  Lymphadenopathy:    He has no cervical adenopathy.  Neurological: He is oriented to person, place, and time. Coordination normal.       Mild decrease sensation left foot  Skin: No rash noted. No erythema.  Psychiatric: He has a normal mood and affect. His behavior is normal.    ED Course  Procedures (including critical care time)  Labs Reviewed - No data to display Ct Head Wo Contrast  07/01/2012  *RADIOLOGY REPORT*  Clinical Data:  Neck pain  CT HEAD WITHOUT CONTRAST,CT CERVICAL SPINE WITHOUT CONTRAST  Technique:  Contiguous axial images were obtained from the base of the skull through the vertex without contrast.,Technique: Multidetector CT imaging of the cervical spine was performed. Multiplanar CT image reconstructions were also generated.  Comparison: 07/06/2010 MRI  Findings:  Head: Mild prominence of the sulci, cisterns, and ventricles, in keeping with diffuse cerebral volume  loss. There is no evidence for acute hemorrhage, hydrocephalus, mass lesion, or abnormal extra- axial fluid collection.  No definite CT evidence for acute infarction.  The visualized paranasal sinuses and mastoid air cells are predominately clear.  Cervical spine: Loss of normal cervical lordosis is similar to comparison MRI. Degenerative disc disease at C6-7 with disc osteophyte complex resulting in mild central canal narrowing and moderate left neural foraminal narrowing This appearance is similar to the comparison MRI. No acute fracture or dislocation.  No aggressive osseous lesion.  Lung apices are clear.  Paravertebral soft tissues within normal limits.  IMPRESSION: No acute intracranial abnormality.  C6-7 degenerative disc disease is similar to prior.  No acute osseous finding.  Original Report Authenticated By: Waneta Martins, M.D.   Ct Cervical Spine Wo Contrast  07/01/2012  *RADIOLOGY REPORT*  Clinical Data: Neck pain  CT HEAD WITHOUT CONTRAST,CT CERVICAL SPINE WITHOUT CONTRAST  Technique:  Contiguous axial images were obtained from the base of the skull through the vertex without contrast.,Technique: Multidetector CT imaging of the cervical spine was performed. Multiplanar CT image reconstructions were also generated.  Comparison: 07/06/2010 MRI  Findings:  Head: Mild prominence of the sulci, cisterns, and ventricles, in keeping with diffuse cerebral volume loss. There is no evidence for acute hemorrhage, hydrocephalus, mass lesion, or abnormal extra- axial fluid collection.  No definite CT evidence for acute infarction.  The visualized paranasal sinuses and mastoid air cells are predominately clear.  Cervical spine: Loss of normal cervical lordosis is similar to comparison MRI. Degenerative disc disease at C6-7 with disc osteophyte complex resulting in mild central canal narrowing and moderate left neural foraminal narrowing This appearance is similar to the comparison MRI. No acute fracture or  dislocation.  No aggressive osseous lesion.  Lung apices are clear.  Paravertebral soft tissues within normal limits.  IMPRESSION: No acute intracranial abnormality.  C6-7 degenerative disc disease is similar to prior.  No acute osseous finding.  Original Report Authenticated By: Waneta Martins, M.D.     1. Neck pain     Pt to get mri monday  MDM          [name], MD 07/01/12 [phone]

## 2012-07-03 ENCOUNTER — Inpatient Hospital Stay (HOSPITAL_COMMUNITY): Admit: 2012-07-03 | Payer: BC Managed Care – PPO

## 2013-01-03 ENCOUNTER — Encounter (HOSPITAL_COMMUNITY): Payer: Self-pay | Admitting: Emergency Medicine

## 2013-01-03 ENCOUNTER — Emergency Department (HOSPITAL_COMMUNITY): Payer: BC Managed Care – PPO

## 2013-01-03 ENCOUNTER — Emergency Department (HOSPITAL_COMMUNITY)
Admission: EM | Admit: 2013-01-03 | Discharge: 2013-01-03 | Disposition: A | Discharge status: Home / Self Care | Payer: BC Managed Care – PPO | Attending: Emergency Medicine | Admitting: Emergency Medicine

## 2013-01-03 DIAGNOSIS — Z79899 Other long term (current) drug therapy: Secondary | ICD-10-CM | POA: Insufficient documentation

## 2013-01-03 DIAGNOSIS — K219 Gastro-esophageal reflux disease without esophagitis: Secondary | ICD-10-CM | POA: Insufficient documentation

## 2013-01-03 DIAGNOSIS — N201 Calculus of ureter: Secondary | ICD-10-CM

## 2013-01-03 LAB — CBC WITH DIFFERENTIAL/PLATELET
Basophils Absolute: 0 10*3/uL (ref 0.0–0.1)
Basophils Relative: 0 % (ref 0–1)
Hemoglobin: 17.9 g/dL — ABNORMAL HIGH (ref 13.0–17.0)
MCHC: 36.1 g/dL — ABNORMAL HIGH (ref 30.0–36.0)
Neutro Abs: 7.1 10*3/uL (ref 1.7–7.7)
Neutrophils Relative %: 79 % — ABNORMAL HIGH (ref 43–77)
Platelets: 243 10*3/uL (ref 150–400)
RDW: 12.2 % (ref 11.5–15.5)

## 2013-01-03 LAB — URINALYSIS, ROUTINE W REFLEX MICROSCOPIC
Glucose, UA: NEGATIVE mg/dL
Ketones, ur: NEGATIVE mg/dL
Protein, ur: 30 mg/dL — AB
pH: 5.5 (ref 5.0–8.0)

## 2013-01-03 LAB — COMPREHENSIVE METABOLIC PANEL
AST: 30 U/L (ref 0–37)
Albumin: 4.1 g/dL (ref 3.5–5.2)
Alkaline Phosphatase: 78 U/L (ref 39–117)
Chloride: 102 mEq/L (ref 96–112)
Potassium: 3.4 mEq/L — ABNORMAL LOW (ref 3.5–5.1)
Sodium: 139 mEq/L (ref 135–145)
Total Bilirubin: 0.5 mg/dL (ref 0.3–1.2)

## 2013-01-03 LAB — URINE MICROSCOPIC-ADD ON

## 2013-01-03 MED ORDER — SODIUM CHLORIDE 0.9 % IV SOLN
INTRAVENOUS | Status: DC
  Administered 2013-01-03: 18:00:00 via INTRAVENOUS

## 2013-01-03 MED ORDER — HYDROMORPHONE HCL PF 1 MG/ML IJ SOLN
1.0000 mg | Freq: Once | INTRAMUSCULAR | Status: AC
  Administered 2013-01-03: 1 mg via INTRAVENOUS
  Filled 2013-01-03: qty 1

## 2013-01-03 MED ORDER — FENTANYL CITRATE 0.05 MG/ML IJ SOLN
50.0000 ug | Freq: Once | INTRAMUSCULAR | Status: AC
  Administered 2013-01-03: 50 ug via INTRAVENOUS
  Filled 2013-01-03: qty 2

## 2013-01-03 MED ORDER — TAMSULOSIN HCL 0.4 MG PO CAPS
0.4000 mg | ORAL_CAPSULE | Freq: Once | ORAL | Status: AC
  Administered 2013-01-03: 0.4 mg via ORAL
  Filled 2013-01-03: qty 1

## 2013-01-03 MED ORDER — IOHEXOL 300 MG/ML  SOLN
100.0000 mL | Freq: Once | INTRAMUSCULAR | Status: AC | PRN
  Administered 2013-01-03: 100 mL via INTRAVENOUS

## 2013-01-03 MED ORDER — OXYCODONE-ACETAMINOPHEN 5-325 MG PO TABS: ORAL_TABLET | ORAL | Status: DC

## 2013-01-03 MED ORDER — TAMSULOSIN HCL 0.4 MG PO CAPS
ORAL_CAPSULE | ORAL | Status: AC
  Filled 2013-01-03: qty 1

## 2013-01-03 MED ORDER — ONDANSETRON 8 MG PO TBDP: 8.0000 mg | ORAL_TABLET | Freq: Three times a day (TID) | ORAL | Status: DC | PRN

## 2013-01-03 MED ORDER — TAMSULOSIN HCL 0.4 MG PO CAPS: ORAL_CAPSULE | ORAL | Status: DC

## 2013-01-03 MED ORDER — IOHEXOL 300 MG/ML  SOLN
50.0000 mL | Freq: Once | INTRAMUSCULAR | Status: AC | PRN
  Administered 2013-01-03: 50 mL via ORAL

## 2013-01-03 MED ORDER — ONDANSETRON HCL 4 MG/2ML IJ SOLN
4.0000 mg | Freq: Once | INTRAMUSCULAR | Status: AC
  Administered 2013-01-03: 4 mg via INTRAVENOUS
  Filled 2013-01-03: qty 2

## 2013-01-03 MED ORDER — PERCOCET 5-325 MG PO TABS: ORAL_TABLET | ORAL | Status: DC

## 2013-01-03 NOTE — ED Notes (Signed)
Occult blood card screening was completed and results were negative. Results have not crossed over from machine to computer system. Lynelle Doctor, MD has been notified of results to screening due to technical failure.

## 2013-01-03 NOTE — ED Notes (Signed)
LLQ cramping since yesterday. States last normal bm today. Normal color. States did have bm yesterday and was dark black. Denies n/v/d.

## 2013-01-03 NOTE — ED Notes (Addendum)
Color wnl.  Pt states it feels like he has to urinate but cant

## 2013-01-03 NOTE — ED Provider Notes (Addendum)
History     CSN: [id number]  Arrival date & time 01/03/13  1702   First MD Initiated Contact with Patient 01/03/13 1711      Chief Complaint  Patient presents with  . Abdominal Cramping    (Consider location/radiation/quality/duration/timing/severity/associated sxs/prior treatment) HPI  Patient states yesterday he started getting a constant left lower quadrant cramping abdominal pain that sometimes is stabbing. He states he feels like he needs to have diarrhea but does. He states his last bowel movement was today and he had 2 normal bowel movements. He denies having constipation or hard stools. He states his stools were black yesterday. He states he did take Pepto-Bismol today.he denies nausea, vomiting or diarrhea. He states nothing he does makes it feel worse, nothing he does makes it feel better. He denies fever. He states he's had a normal appetite. He states he had similar pain before with "twisted intestine" at that time however he was vomiting blood.  PCP Dr Renard Matter  Past Medical History  Diagnosis Date  . GERD (gastroesophageal reflux disease)     Past Surgical History  Procedure Date  . Wrist surgery     History reviewed. No pertinent family history.  History  Substance Use Topics  . Smoking status: Never Smoker   . Smokeless tobacco: Not on file  . Alcohol Use: Yes     Comment: occ   Employed   Review of Systems  Genitourinary: Negative for flank pain.  All other systems reviewed and are negative.    Allergies  Ibuprofen and Morphine and related  Home Medications   Current Outpatient Rx  Name  Route  Sig  Dispense  Refill  . ESOMEPRAZOLE MAGNESIUM 40 MG PO CPDR   Oral   Take 40 mg by mouth daily.           BP 128/94  Pulse 80  Temp 97.8 F (36.6 C) (Oral)  Resp 18  Ht 5\' 5"  (1.651 m)  Wt 189 lb 7 oz (85.928 kg)  BMI 31.52 kg/m2  SpO2 94%  Vital signs normal    Physical Exam  Nursing note and vitals reviewed. Constitutional: He  is oriented to person, place, and time. He appears well-developed and well-nourished.  Non-toxic appearance. He does not appear ill. No distress.  HENT:  Head: Normocephalic and atraumatic.  Right Ear: External ear normal.  Left Ear: External ear normal.  Nose: Nose normal. No mucosal edema or rhinorrhea.  Mouth/Throat: Oropharynx is clear and moist and mucous membranes are normal. No dental abscesses or uvula swelling.  Eyes: Conjunctivae normal and EOM are normal. Pupils are equal, round, and reactive to light.  Neck: Normal range of motion and full passive range of motion without pain. Neck supple.  Cardiovascular: Normal rate, regular rhythm and normal heart sounds.  Exam reveals no gallop and no friction rub.   No murmur heard. Pulmonary/Chest: Effort normal and breath sounds normal. No respiratory distress. He has no wheezes. He has no rhonchi. He has no rales. He exhibits no tenderness and no crepitus.  Abdominal: Soft. Normal appearance and bowel sounds are normal. He exhibits no distension. There is no tenderness. There is no rebound and no guarding.    Genitourinary:       Normal rectum, no hemorrhoids, minimal yellow/brown stool present  Musculoskeletal: Normal range of motion. He exhibits no edema and no tenderness.       Moves all extremities well.   Neurological: He is alert and oriented to person, place, and  time. He has normal strength. No cranial nerve deficit.  Skin: Skin is warm, dry and intact. No rash noted. No erythema. No pallor.  Psychiatric: He has a normal mood and affect. His speech is normal and behavior is normal. His mood appears not anxious.    ED Course  Procedures (including critical care time)   Medications  Tamsulosin HCl (FLOMAX) 0.4 MG CAPS (not administered)  PERCOCET 5-325 MG per tablet (not administered)  ondansetron (ZOFRAN ODT) 8 MG disintegrating tablet (not administered)  oxyCODONE-acetaminophen (PERCOCET/ROXICET) 5-325 MG per tablet (not  administered)  ondansetron (ZOFRAN) injection 4 mg (4 mg Intravenous Given 01/03/13 1751)  fentaNYL (SUBLIMAZE) injection 50 mcg (50 mcg Intravenous Given 01/03/13 1752)  iohexol (OMNIPAQUE) 300 MG/ML solution 50 mL (50 mL Oral Contrast Given 01/03/13 1839)  iohexol (OMNIPAQUE) 300 MG/ML solution 100 mL (100 mL Intravenous Contrast Given 01/03/13 1843)  fentaNYL (SUBLIMAZE) injection 50 mcg (50 mcg Intravenous Given 01/03/13 1921)  HYDROmorphone (DILAUDID) injection 1 mg (1 mg Intravenous Given 01/03/13 2010)  Tamsulosin HCl (FLOMAX) capsule 0.4 mg (0.4 mg Oral Given 01/03/13 2102)   Pt given results of his CT scan. Denies having any flank pain, states he has chronic back pain and "wouldn't be able to tell".   Results for orders placed during the hospital encounter of 01/03/13  CBC WITH DIFFERENTIAL      Component Value Range   WBC 9.0  4.0 - 10.5 K/uL   RBC 5.25  4.22 - 5.81 MIL/uL   Hemoglobin 17.9 (*) 13.0 - 17.0 g/dL   HCT 95.6  21.3 - 08.6 %   MCV 94.5  78.0 - 100.0 fL   MCH 34.1 (*) 26.0 - 34.0 pg   MCHC 36.1 (*) 30.0 - 36.0 g/dL   RDW 57.8  46.9 - 62.9 %   Platelets 243  150 - 400 K/uL   Neutrophils Relative 79 (*) 43 - 77 %   Neutro Abs 7.1  1.7 - 7.7 K/uL   Lymphocytes Relative 16  12 - 46 %   Lymphs Abs 1.4  0.7 - 4.0 K/uL   Monocytes Relative 5  3 - 12 %   Monocytes Absolute 0.5  0.1 - 1.0 K/uL   Eosinophils Relative 0  0 - 5 %   Eosinophils Absolute 0.0  0.0 - 0.7 K/uL   Basophils Relative 0  0 - 1 %   Basophils Absolute 0.0  0.0 - 0.1 K/uL  COMPREHENSIVE METABOLIC PANEL      Component Value Range   Sodium 139  135 - 145 mEq/L   Potassium 3.4 (*) 3.5 - 5.1 mEq/L   Chloride 102  96 - 112 mEq/L   CO2 26  19 - 32 mEq/L   Glucose, Bld 135 (*) 70 - 99 mg/dL   BUN 9  6 - 23 mg/dL   Creatinine, Ser 5.28  0.50 - 1.35 mg/dL   Calcium 9.4  8.4 - 41.3 mg/dL   Total Protein 7.6  6.0 - 8.3 g/dL   Albumin 4.1  3.5 - 5.2 g/dL   AST 30  0 - 37 U/L   ALT 55 (*) 0 - 53 U/L    Alkaline Phosphatase 78  39 - 117 U/L   Total Bilirubin 0.5  0.3 - 1.2 mg/dL   GFR calc non Af Amer >90  >90 mL/min   GFR calc Af Amer >90  >90 mL/min  URINALYSIS, ROUTINE W REFLEX MICROSCOPIC      Component Value Range  Color, Urine YELLOW  YELLOW   APPearance CLEAR  CLEAR   Specific Gravity, Urine >1.030 (*) 1.005 - 1.030   pH 5.5  5.0 - 8.0   Glucose, UA NEGATIVE  NEGATIVE mg/dL   Hgb urine dipstick LARGE (*) NEGATIVE   Bilirubin Urine NEGATIVE  NEGATIVE   Ketones, ur NEGATIVE  NEGATIVE mg/dL   Protein, ur 30 (*) NEGATIVE mg/dL   Urobilinogen, UA 0.2  0.0 - 1.0 mg/dL   Nitrite NEGATIVE  NEGATIVE   Leukocytes, UA NEGATIVE  NEGATIVE  URINE MICROSCOPIC-ADD ON      Component Value Range   Squamous Epithelial / LPF RARE  RARE   WBC, UA 0-2  <3 WBC/hpf   RBC / HPF 21-50  <3 RBC/hpf   Urine-Other MUCOUS PRESENT     Laboratory interpretation all normal except mild hypokalemia, concentrated urine, hematuria  Hemoccult neg  Ct Abdomen Pelvis W Contrast  01/03/2013  *RADIOLOGY REPORT*  Clinical Data: 52 year old male with left abdominal and pelvic pain.  CT ABDOMEN AND PELVIS WITH CONTRAST  Technique:  Multidetector CT imaging of the abdomen and pelvis was performed following the standard protocol during bolus administration of intravenous contrast.  Contrast: OMNIPAQUE IOHEXOL 300 MG/ML  SOLN  Comparison: 09/02/2010 CT  Findings: Mild fatty infiltration of the liver is noted. The gallbladder, spleen, pancreas, and adrenal glands are unremarkable.  A 4 mm left UVJ calculus causes mild left hydronephrosis. Bilateral renal cysts are present.  No free fluid, enlarged lymph nodes, biliary dilation or abdominal aortic aneurysm identified.  A few scattered colonic diverticula are present without diverticulitis. The appendix is normal.  Moderate bilateral inguinal hernias containing fat are noted.  No acute or suspicious bony abnormalities are identified.  IMPRESSION: 4 mm left UVJ calculus  causing mild left hydronephrosis.  Mild fatty infiltration of the liver.  Moderate bilateral inguinal hernias containing fat.   Original Report Authenticated By: Harmon Pier, M.D.      1. Ureteral stone    New Prescriptions   ONDANSETRON (ZOFRAN ODT) 8 MG DISINTEGRATING TABLET    Take 1 tablet (8 mg total) by mouth every 8 (eight) hours as needed for nausea.   OXYCODONE-ACETAMINOPHEN (PERCOCET/ROXICET) 5-325 MG PER TABLET    Take 1 or 2 po Q 6hrs for pain   PERCOCET 5-325 MG PER TABLET    Take 1 or 2 po Q 6hrs for pain   TAMSULOSIN HCL (FLOMAX) 0.4 MG CAPS    Take 1 po QD until you pass the stone.    Plan discharge  Devoria Albe, MD, FACEP    MDM          Ward Givens, MD 01/03/13 2111  Ward Givens, MD 01/03/13 2156

## 2013-01-04 LAB — OCCULT BLOOD, POC DEVICE: Fecal Occult Bld: NEGATIVE

## 2013-01-08 MED FILL — Oxycodone w/ Acetaminophen Tab 5-325 MG: ORAL | Qty: 6 | Status: AC

## 2013-06-04 ENCOUNTER — Encounter (HOSPITAL_COMMUNITY): Payer: Self-pay | Admitting: *Deleted

## 2013-06-04 ENCOUNTER — Emergency Department (HOSPITAL_COMMUNITY)
Admission: EM | Admit: 2013-06-04 | Discharge: 2013-06-04 | Disposition: A | Discharge status: Home / Self Care | Payer: BC Managed Care – PPO | Attending: Emergency Medicine | Admitting: Emergency Medicine

## 2013-06-04 DIAGNOSIS — T7840XA Allergy, unspecified, initial encounter: Secondary | ICD-10-CM

## 2013-06-04 DIAGNOSIS — R220 Localized swelling, mass and lump, head: Secondary | ICD-10-CM | POA: Insufficient documentation

## 2013-06-04 DIAGNOSIS — T63461A Toxic effect of venom of wasps, accidental (unintentional), initial encounter: Secondary | ICD-10-CM | POA: Insufficient documentation

## 2013-06-04 DIAGNOSIS — L299 Pruritus, unspecified: Secondary | ICD-10-CM | POA: Insufficient documentation

## 2013-06-04 DIAGNOSIS — Y939 Activity, unspecified: Secondary | ICD-10-CM | POA: Insufficient documentation

## 2013-06-04 DIAGNOSIS — K219 Gastro-esophageal reflux disease without esophagitis: Secondary | ICD-10-CM | POA: Insufficient documentation

## 2013-06-04 DIAGNOSIS — Z79899 Other long term (current) drug therapy: Secondary | ICD-10-CM | POA: Insufficient documentation

## 2013-06-04 DIAGNOSIS — R4182 Altered mental status, unspecified: Secondary | ICD-10-CM | POA: Insufficient documentation

## 2013-06-04 DIAGNOSIS — R42 Dizziness and giddiness: Secondary | ICD-10-CM | POA: Insufficient documentation

## 2013-06-04 DIAGNOSIS — Y929 Unspecified place or not applicable: Secondary | ICD-10-CM | POA: Insufficient documentation

## 2013-06-04 DIAGNOSIS — T6391XA Toxic effect of contact with unspecified venomous animal, accidental (unintentional), initial encounter: Secondary | ICD-10-CM | POA: Insufficient documentation

## 2013-06-04 DIAGNOSIS — R221 Localized swelling, mass and lump, neck: Secondary | ICD-10-CM | POA: Insufficient documentation

## 2013-06-04 NOTE — ED Notes (Addendum)
Patient allergic to bee stings, stung by yellow jacket on bottom L lop 30 minutes ago.  Reports slight difficltly breathing.  States bee stings make him break out in rash.  Swelling noted to bottom lip.  Lungs clear bilaterally, BP stable.

## 2013-06-04 NOTE — ED Provider Notes (Signed)
History     This chart was scribed for [name], MD, MD by Smitty Pluck, ED Scribe. The patient was seen in room APA05/APA05 and the patient's care was started at 4:19 PM.  CSN: [id number] Arrival date & time 06/04/13  1531  Chief Complaint  Patient presents with  . Allergic Reaction  . Shortness of Breath    Patient is a 52 y.o. male presenting with allergic reaction and shortness of breath. The history is provided by the patient and medical records. No language interpreter was used.  Allergic Reaction Presenting symptoms: itching and swelling   Presenting symptoms: no difficulty swallowing and no rash   Itching:    Location:  Mouth   Severity:  Mild   Onset quality:  Sudden   Duration:  1 hour   Timing:  Constant   Progression:  Resolved Swelling:    Location:  Mouth   Onset quality:  Sudden   Duration:  1 hour   Timing:  Constant   Progression:  Resolved   Chronicity:  New Severity:  Mild Shortness of Breath Associated symptoms: no abdominal pain, no chest pain, no cough, no headaches and no rash    HPI Comments: [name] is a 52 y.o. male who presents to the Emergency Department complaining of bee sting to lower lip causing mild swelling of lower lip and itching sensation to face onset today. Pt reports that he had mild SOB, mild weakness and dizziness but symptoms have subsided. Pt took benadryl with minor relief. 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 Pt denies tongue swelling, fever, chills, nausea, vomiting, diarrhea, cough, SOB and any other pain.   Past Medical History  Diagnosis Date  . GERD (gastroesophageal reflux disease)    Past Surgical History  Procedure Laterality Date  . Wrist surgery     History reviewed. No pertinent family history. History  Substance Use Topics  . Smoking status: Never Smoker   . Smokeless tobacco: Not on file  . Alcohol Use: Yes     Comment: occ    Review of Systems  Constitutional: Negative for  appetite change and fatigue.  HENT: Positive for facial swelling. Negative for congestion, trouble swallowing, voice change, sinus pressure and ear discharge.   Eyes: Negative for discharge.  Respiratory: Positive for shortness of breath. Negative for cough.   Cardiovascular: Negative for chest pain.  Gastrointestinal: Negative for abdominal pain and diarrhea.  Genitourinary: Negative for frequency and hematuria.  Musculoskeletal: Negative for back pain.  Skin: Positive for itching. Negative for rash.  Neurological: Positive for dizziness and weakness. Negative for seizures and headaches.  Psychiatric/Behavioral: Negative for hallucinations.    Allergies  Ibuprofen; Morphine and related; and Bee venom  Home Medications   Current Outpatient Rx  Name  Route  Sig  Dispense  Refill  . diphenhydrAMINE (BENADRYL) 25 MG tablet   Oral   Take 25 mg by mouth every 6 (six) hours as needed for itching.         . esomeprazole (NEXIUM) 40 MG capsule   Oral   Take 40 mg by mouth daily.          BP 147/91  Pulse 95  Temp(Src) 97.7 F (36.5 C) (Oral)  Wt 189 lb (85.73 kg)  BMI 31.45 kg/m2  SpO2 95% Physical Exam  Nursing note and vitals reviewed. Constitutional: He is oriented to person, place, and time. He appears well-developed.  HENT:  Head: Normocephalic.  Eyes: Conjunctivae  and EOM are normal. No scleral icterus.  Neck: Neck supple. No thyromegaly present.  Cardiovascular: Normal rate and regular rhythm.  Exam reveals no gallop and no friction rub.   No murmur heard. Pulmonary/Chest: No stridor. He has no wheezes. He has no rales. He exhibits no tenderness.  Abdominal: He exhibits no distension. There is no tenderness. There is no rebound.  Musculoskeletal: Normal range of motion. He exhibits no edema.  Lymphadenopathy:    He has no cervical adenopathy.  Neurological: He is oriented to person, place, and time. Coordination normal.  Skin: No rash noted. No erythema.   Psychiatric: He has a normal mood and affect. His behavior is normal.    ED Course  Procedures (including critical care time) DIAGNOSTIC STUDIES: Oxygen Saturation is 95% on room air, adequate by my interpretation.    COORDINATION OF CARE: 4:21 PM Discussed ED treatment with pt and pt agrees.       Labs Reviewed - No data to display No results found. No diagnosis found.  MDM     The chart was scribed for me under my direct supervision.  I personally performed the history, physical, and medical decision making and all procedures in the evaluation of this patient.[name], MD 06/04/13 [phone]

## 2013-06-04 NOTE — ED Notes (Signed)
Lip swelling has gone down.  Patient states his breathing is fine.

## 2013-11-16 ENCOUNTER — Emergency Department (HOSPITAL_COMMUNITY)
Admission: EM | Admit: 2013-11-16 | Discharge: 2013-11-16 | Disposition: A | Discharge status: Home / Self Care | Payer: BC Managed Care – PPO | Attending: Emergency Medicine | Admitting: Emergency Medicine

## 2013-11-16 ENCOUNTER — Encounter (HOSPITAL_COMMUNITY): Payer: Self-pay | Admitting: Emergency Medicine

## 2013-11-16 DIAGNOSIS — K219 Gastro-esophageal reflux disease without esophagitis: Secondary | ICD-10-CM | POA: Insufficient documentation

## 2013-11-16 DIAGNOSIS — Z79899 Other long term (current) drug therapy: Secondary | ICD-10-CM | POA: Insufficient documentation

## 2013-11-16 DIAGNOSIS — K4090 Unilateral inguinal hernia, without obstruction or gangrene, not specified as recurrent: Secondary | ICD-10-CM | POA: Insufficient documentation

## 2013-11-16 MED ORDER — OXYCODONE-ACETAMINOPHEN 5-325 MG PO TABS: 1.0000 | ORAL_TABLET | ORAL | Status: DC | PRN

## 2013-11-16 NOTE — ED Notes (Signed)
Patient asking for something for "the burning pain"  Dr Rosalia Hammers informed

## 2013-11-16 NOTE — ED Notes (Signed)
Swollen area rt groin for 3 weeks , painful

## 2013-11-16 NOTE — ED Provider Notes (Signed)
CSN: [id number]     Arrival date & time 11/16/13  2115 History  This chart was scribed for Nathan Quarry, MD by Quintella Reichert, ED scribe.  This patient was seen in room APA03/APA03 and the patient's care was started at 9:40 PM.   No chief complaint on file.   Patient is a 52 y.o. male presenting with groin pain. The history is provided by the patient. No language interpreter was used.  Groin Pain This is a new problem. The current episode started more than 1 week ago. Episode frequency: Intermittently. The problem has been gradually worsening. Pertinent negatives include no chest pain, no abdominal pain, no headaches and no shortness of breath. Associated symptoms comments: swelling. Exacerbated by: lifting. Nothing relieves the symptoms. He has tried nothing for the symptoms.    HPI Comments: [name] is a 52 y.o. male who presents to the Emergency Department complaining of a swollen "knot" to the right groin that he first noticed 2 weeks ago.  Pt states that the knot "comes and goes" particularly with lifting.  Yesterday with activity it came on again with associated burning pain.  It has persisted since then.  Pt admits to h/o GERD and medicates regularly with Nexium.  He denies any other chronic medical conditions or regular medication usage.  He does not smoke and drinks alcohol on occasion.    Past Medical History  Diagnosis Date  . GERD (gastroesophageal reflux disease)     Past Surgical History  Procedure Laterality Date  . Wrist surgery    . Hand surgery      History reviewed. No pertinent family history.   History  Substance Use Topics  . Smoking status: Never Smoker   . Smokeless tobacco: Not on file  . Alcohol Use: Yes     Comment: occ     Review of Systems  Respiratory: Negative for shortness of breath.   Cardiovascular: Negative for chest pain.  Gastrointestinal: Negative for abdominal pain.  Genitourinary:       Swollen painful knot to right  groin  Neurological: Negative for headaches.  All other systems reviewed and are negative.     Allergies  Ibuprofen; Morphine and related; and Bee venom  Home Medications   Current Outpatient Rx  Name  Route  Sig  Dispense  Refill  . diphenhydrAMINE (BENADRYL) 25 MG tablet   Oral   Take 25 mg by mouth every 6 (six) hours as needed for itching.         . esomeprazole (NEXIUM) 40 MG capsule   Oral   Take 40 mg by mouth daily.          BP 140/93  Pulse 95  Temp(Src) 98.2 F (36.8 C) (Oral)  Resp 20  Ht 5\' 5"  (1.651 m)  Wt 185 lb (83.915 kg)  BMI 30.79 kg/m2  SpO2 100%  Physical Exam  Nursing note and vitals reviewed. Constitutional: He is oriented to person, place, and time. He appears well-developed and well-nourished.  HENT:  Head: Normocephalic and atraumatic.  Right Ear: External ear normal.  Left Ear: External ear normal.  Nose: Nose normal.  Mouth/Throat: Oropharynx is clear and moist.  Eyes: Conjunctivae and EOM are normal. Pupils are equal, round, and reactive to light.  Neck: Normal range of motion. Neck supple.  Cardiovascular: Normal rate, regular rhythm, normal heart sounds and intact distal pulses.   Pulmonary/Chest: Effort normal and breath sounds normal. No respiratory distress. He has no wheezes. He exhibits  no tenderness.  Abdominal: Soft. Bowel sounds are normal. He exhibits mass. He exhibits no distension. There is no tenderness. There is no guarding.  Right inguinal defect palpated with some swelling.  Easily reduced.  Musculoskeletal: Normal range of motion.  Neurological: He is alert and oriented to person, place, and time. He has normal reflexes. He exhibits normal muscle tone. Coordination normal.  Skin: Skin is warm and dry.  Psychiatric: He has a normal mood and affect. His behavior is normal. Judgment and thought content normal.    ED Course  Procedures (including critical care time)  DIAGNOSTIC STUDIES: Oxygen Saturation is 100%  on room air, normal by my interpretation.    COORDINATION OF CARE: 9:45 PM-Informed pt that symptoms are likely due to a hernia and do not indicate urgent medical intervention.  Discussed treatment plan which includes rest referral to surgery for repair if needed.  Advised return precautions.  Pt expressed understanding and agreed with plan.    Labs Review Labs Reviewed - No data to display  Imaging Review No results found.  EKG Interpretation   None       MDM  No diagnosis found. History and physical exam c.w. Right inguinal hernia.  No obstruction noted.  Patient advised to follow up with surgeon and return precautions given.   I personally performed the services described in this documentation, which was scribed in my presence. The recorded information has been reviewed and considered.    Nathan Quarry, MD 11/17/13 [phone]

## 2013-11-19 ENCOUNTER — Encounter (HOSPITAL_COMMUNITY): Payer: Self-pay | Admitting: Pharmacy Technician

## 2013-11-20 MED FILL — Oxycodone w/ Acetaminophen Tab 5-325 MG: ORAL | Qty: 6 | Status: AC

## 2013-11-20 NOTE — Patient Instructions (Signed)
Your procedure is scheduled on: 11/23/2013  Report to [facility] at  6:15   AM.  Call this number if you have problems the morning of surgery: [phone]   Remember:   Do not drink or eat food:After Midnight.  :  Take these medicines the morning of surgery with A SIP OF WATER: Nexium   Do not wear jewelry, make-up or nail polish.  Do not wear lotions, powders, or perfumes. You may wear deodorant.  Do not shave 48 hours prior to surgery. Men may shave face and neck.  Do not bring valuables to the hospital.  Contacts, dentures or bridgework may not be worn into surgery.  Leave suitcase in the car. After surgery it may be brought to your room.  For patients admitted to the hospital, checkout time is 11:00 AM the day of discharge.   Patients discharged the day of surgery will not be allowed to drive home.    Special Instructions: Shower using CHG 2 nights before surgery and the night before surgery.  If you shower the day of surgery use CHG.  Use special wash - you have one bottle of CHG for all showers.  You should use approximately 1/3 of the bottle for each shower.   Please read over the following fact sheets that you were given: Pain Booklet, MRSA Information, Surgical Site Infection Prevention and Care and Recovery After Surgery  Hernia Repair Care After These instructions give you information on caring for yourself after your procedure. Your doctor may also give you more specific instructions. Call your doctor if you have any problems or questions after your procedure. HOME CARE   You may have changes in your poops (bowel movements).  You may have loose or watery poop (diarrhea).  You may be not able to poop.  Your bowels will slowly get back to normal.  Do not eat any food that makes you sick to your stomach (nauseous). Eat small meals 4 to 6 times a day instead of 3 large ones.  Do not drink pop. It will give you gas.  Do not drink alcohol.  Do not lift anything heavier  than 10 pounds. This is about the weight of a gallon of milk.  Do not do anything that makes you very tired for at least 6 weeks.  Do not get your wound wet for 2 days.  You may take a sponge bath during this time.  After 2 days you may take a shower. Gently pat your surgical cut (incision) dry with a towel. Do not rub it.  For men: You may have been given an athletic supporter (scrotal support) before you left the hospital. It holds your scrotum and testicles closer to your body so there is no strain on your wound. Wear the supporter until your doctor tells you that you do not need it anymore. GET HELP RIGHT AWAY IF:  You have watery poop, or cannot poop for more than 3 days.  You feel sick to your stomach or throw up (vomit) more than 2 or 3 times.  You have temperature by mouth above 102 F (38.9 C).  You see redness or puffiness (swelling) around your wound.  You see yellowish white fluid (pus) coming from your wound.  You see a bulge or bump in your lower belly (abdomen) or near your groin.  You develop a rash, trouble breathing, or any other symptoms from medicines taken. MAKE SURE YOU:  Understand these instructions.  Will watch your condition.  Will get help right away if your are not doing well or get worse. Document Released: 11/04/2008 Document Revised: 02/14/2012 Document Reviewed: 11/04/2008 [facility] Patient Information 2014 [place], [place].

## 2013-11-20 NOTE — Consult Note (Signed)
NAME:  [name], [name] NO.:  [id number]  MEDICAL RECORD NO.:  [id number]  LOCATION:  PERIO                         FACILITY:  [facility]  PHYSICIAN:  Barbaraann Barthel, M.D. DATE OF BIRTH:  02/16/1961  DATE OF CONSULTATION:  11/19/2013 DATE OF DISCHARGE:                                CONSULTATION   NOTE:  This is a 52 year old white male who was referred for a right inguinal hernia.  He was seen recently in the emergency room by Dr. Rosalia Hammers. He was complaining of burning and pain in his right groin area.  He has had this hernia for at least he thinks at least 5 years.  Other medical problems include GERD for which he takes Nexium.  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  He did suffer a fall in 2005, this did not require surgery but he has had chronic back pain since then.  MEDICATIONS:  See medication list.  ALLERGIES:  He is allergic to MORPHINE and IBUPROFEN.  SOCIAL HISTORY:  He does not drink or smoke.  PHYSICAL EXAMINATION:  VITAL SIGNS:  He is 5 feet 8 inches, weighs 185 pounds.  His temperature is 99, pulse rate is 96, respirations 12, blood pressure 120/80, O2 sat is 98%. HEENT:  Head is normocephalic.  Eyes, extraocular movements are intact. Pupils are round and reactive to light and accommodation.  There is no conjunctival, pallor or scleral injection.  The sclera has a normal tincture.  Nose and oral mucosa are moist. NECK:  Supple and cylindrical without jugular vein distention, thyromegaly, tracheal deviation, or cervical adenopathy, or thyromegaly. No bruits are appreciated. CHEST:  Clear both anterior and posterior auscultation. HEART:  Regular rhythm. ABDOMEN:  Soft.  The patient has an obvious right inguinal hernia, this is reducible.  This is quite large in size but reducible.  He does not have a left inguinal hernia  that is appreciated. RECTAL:  Deferred. EXTREMITIES:  Within normal limits without clubbing, cyanosis, or varicosities.  REVIEW OF SYSTEMS:  NEURO:  No history of migraines or seizures.  He has no lateralizing neurological findings.  ENDOCRINE:  No history of diabetes, thyroid disease, or adrenal problems. CARDIOPULMONARY:  Within normal limits.  MUSCULOSKELETAL:  Chronic aches and pains regarding his right upper extremity from previous surgery and chronic back pain. GI:  No history of hepatitis, constipation, diarrhea, bright red rectal bleeding, melena or history of inflammatory bowel disease or irritable bowel syndrome.  No unexplained weight loss, past colonoscopy was in 2012.  GU:  No history of frequency, dysuria.  He has however had a history of kidney stones.  Review of history and physical therefore Mr. [name] is a 52 year old white male who has had a long time right inguinal hernia.  We will plan to do elective repair of this.  I discussed complications not limited to but including bleeding, infection, and recurrence and informed consent was obtained.  We have made arrangements with the outpatient department for surgery.     Barbaraann Barthel, M.D.     WB/MEDQ  D:  11/19/2013  T:  11/20/2013  Job:  [id number]  cc:   Angus G. Renard Matter, MD Fax: [phone]  Margarita Grizzle, MD Fax: [phone]

## 2013-11-21 ENCOUNTER — Encounter (HOSPITAL_COMMUNITY): Payer: Self-pay

## 2013-11-21 ENCOUNTER — Encounter (HOSPITAL_COMMUNITY)
Admission: RE | Admit: 2013-11-21 | Discharge: 2013-11-21 | Disposition: A | Discharge status: Home / Self Care | Payer: BC Managed Care – PPO | Source: Ambulatory Visit | Attending: Adult Health | Admitting: Adult Health

## 2013-11-21 ENCOUNTER — Other Ambulatory Visit: Payer: Self-pay

## 2013-11-21 LAB — CBC WITH DIFFERENTIAL/PLATELET
Basophils Absolute: 0 10*3/uL (ref 0.0–0.1)
Eosinophils Relative: 1 % (ref 0–5)
HCT: 47.5 % (ref 39.0–52.0)
Hemoglobin: 16.9 g/dL (ref 13.0–17.0)
Lymphocytes Relative: 25 % (ref 12–46)
Lymphs Abs: 1.9 10*3/uL (ref 0.7–4.0)
MCV: 95.4 fL (ref 78.0–100.0)
Monocytes Absolute: 0.5 10*3/uL (ref 0.1–1.0)
Monocytes Relative: 7 % (ref 3–12)
Neutro Abs: 4.9 10*3/uL (ref 1.7–7.7)
RBC: 4.98 MIL/uL (ref 4.22–5.81)
RDW: 12.1 % (ref 11.5–15.5)
WBC: 7.4 10*3/uL (ref 4.0–10.5)

## 2013-11-21 LAB — BASIC METABOLIC PANEL
BUN: 10 mg/dL (ref 6–23)
CO2: 25 mEq/L (ref 19–32)
Calcium: 9.1 mg/dL (ref 8.4–10.5)
Chloride: 102 mEq/L (ref 96–112)
Creatinine, Ser: 0.84 mg/dL (ref 0.50–1.35)
Glucose, Bld: 129 mg/dL — ABNORMAL HIGH (ref 70–99)

## 2013-11-23 ENCOUNTER — Encounter (HOSPITAL_COMMUNITY)
Admission: RE | Disposition: A | Discharge status: Home / Self Care | Payer: Self-pay | Source: Ambulatory Visit | Attending: General Surgery

## 2013-11-23 ENCOUNTER — Ambulatory Visit (HOSPITAL_COMMUNITY)
Admission: RE | Admit: 2013-11-23 | Discharge: 2013-11-24 | Disposition: A | Discharge status: Home / Self Care | Payer: BC Managed Care – PPO | Source: Ambulatory Visit | Attending: General Surgery | Admitting: General Surgery

## 2013-11-23 ENCOUNTER — Encounter (HOSPITAL_COMMUNITY): Payer: BC Managed Care – PPO | Admitting: Anesthesiology

## 2013-11-23 ENCOUNTER — Ambulatory Visit (HOSPITAL_COMMUNITY): Payer: BC Managed Care – PPO | Admitting: Anesthesiology

## 2013-11-23 ENCOUNTER — Encounter (HOSPITAL_COMMUNITY): Payer: Self-pay | Admitting: *Deleted

## 2013-11-23 DIAGNOSIS — Z01812 Encounter for preprocedural laboratory examination: Secondary | ICD-10-CM | POA: Insufficient documentation

## 2013-11-23 DIAGNOSIS — K4090 Unilateral inguinal hernia, without obstruction or gangrene, not specified as recurrent: Secondary | ICD-10-CM | POA: Diagnosis present

## 2013-11-23 HISTORY — PX: INGUINAL HERNIA REPAIR: SHX194

## 2013-11-23 SURGERY — REPAIR, HERNIA, INGUINAL, ADULT
Anesthesia: General | Laterality: Right

## 2013-11-23 MED ORDER — GLYCOPYRROLATE 0.2 MG/ML IJ SOLN
INTRAMUSCULAR | Status: DC | PRN
  Administered 2013-11-23: .6 mg via INTRAVENOUS

## 2013-11-23 MED ORDER — NEOSTIGMINE METHYLSULFATE 1 MG/ML IJ SOLN
INTRAMUSCULAR | Status: DC | PRN
  Administered 2013-11-23: 3 mg via INTRAVENOUS

## 2013-11-23 MED ORDER — BUPIVACAINE HCL (PF) 0.5 % IJ SOLN
INTRAMUSCULAR | Status: AC
  Filled 2013-11-23: qty 30

## 2013-11-23 MED ORDER — GLYCOPYRROLATE 0.2 MG/ML IJ SOLN
INTRAMUSCULAR | Status: AC
  Filled 2013-11-23: qty 2

## 2013-11-23 MED ORDER — MIDAZOLAM HCL 2 MG/2ML IJ SOLN
INTRAMUSCULAR | Status: AC
  Filled 2013-11-23: qty 2

## 2013-11-23 MED ORDER — BUPIVACAINE HCL (PF) 0.5 % IJ SOLN
INTRAMUSCULAR | Status: DC | PRN
  Administered 2013-11-23 (×2): 10 mL

## 2013-11-23 MED ORDER — FENTANYL CITRATE 0.05 MG/ML IJ SOLN
INTRAMUSCULAR | Status: AC
  Filled 2013-11-23: qty 5

## 2013-11-23 MED ORDER — FENTANYL CITRATE 0.05 MG/ML IJ SOLN
INTRAMUSCULAR | Status: DC | PRN
  Administered 2013-11-23 (×5): 50 ug via INTRAVENOUS

## 2013-11-23 MED ORDER — PROPOFOL 10 MG/ML IV BOLUS
INTRAVENOUS | Status: AC
  Filled 2013-11-23: qty 20

## 2013-11-23 MED ORDER — LABETALOL HCL 5 MG/ML IV SOLN
10.0000 mg | Freq: Once | INTRAVENOUS | Status: AC
  Administered 2013-11-23: 10 mg via INTRAVENOUS

## 2013-11-23 MED ORDER — PANTOPRAZOLE SODIUM 40 MG PO TBEC
40.0000 mg | DELAYED_RELEASE_TABLET | Freq: Every day | ORAL | Status: DC
  Administered 2013-11-23 – 2013-11-24 (×2): 40 mg via ORAL
  Filled 2013-11-23 (×2): qty 1

## 2013-11-23 MED ORDER — OXYCODONE-ACETAMINOPHEN 5-325 MG PO TABS
1.0000 | ORAL_TABLET | ORAL | Status: DC | PRN
  Administered 2013-11-23: 1 via ORAL
  Filled 2013-11-23: qty 1

## 2013-11-23 MED ORDER — ONDANSETRON HCL 4 MG/2ML IJ SOLN: 4.0000 mg | Freq: Four times a day (QID) | INTRAMUSCULAR | Status: DC | PRN

## 2013-11-23 MED ORDER — LABETALOL HCL 5 MG/ML IV SOLN
INTRAVENOUS | Status: AC
  Filled 2013-11-23: qty 4

## 2013-11-23 MED ORDER — ONDANSETRON HCL 4 MG PO TABS: 4.0000 mg | ORAL_TABLET | Freq: Four times a day (QID) | ORAL | Status: DC | PRN

## 2013-11-23 MED ORDER — DEXAMETHASONE SODIUM PHOSPHATE 4 MG/ML IJ SOLN
INTRAMUSCULAR | Status: AC
  Filled 2013-11-23: qty 1

## 2013-11-23 MED ORDER — LORAZEPAM 1 MG PO TABS
1.0000 mg | ORAL_TABLET | Freq: Every day | ORAL | Status: DC
  Administered 2013-11-23: 1 mg via ORAL
  Filled 2013-11-23: qty 1

## 2013-11-23 MED ORDER — FENTANYL CITRATE 0.05 MG/ML IJ SOLN
INTRAMUSCULAR | Status: AC
  Filled 2013-11-23: qty 2

## 2013-11-23 MED ORDER — ONDANSETRON HCL 4 MG/2ML IJ SOLN
4.0000 mg | Freq: Once | INTRAMUSCULAR | Status: AC
  Administered 2013-11-23: 4 mg via INTRAVENOUS

## 2013-11-23 MED ORDER — ROCURONIUM BROMIDE 50 MG/5ML IV SOLN
INTRAVENOUS | Status: AC
  Filled 2013-11-23: qty 1

## 2013-11-23 MED ORDER — ROCURONIUM BROMIDE 100 MG/10ML IV SOLN
INTRAVENOUS | Status: DC | PRN
  Administered 2013-11-23: 35 mg via INTRAVENOUS
  Administered 2013-11-23: 5 mg via INTRAVENOUS
  Administered 2013-11-23 (×2): 10 mg via INTRAVENOUS

## 2013-11-23 MED ORDER — 0.9 % SODIUM CHLORIDE (POUR BTL) OPTIME
TOPICAL | Status: DC | PRN
  Administered 2013-11-23: 1000 mL

## 2013-11-23 MED ORDER — FENTANYL CITRATE 0.05 MG/ML IJ SOLN
25.0000 ug | INTRAMUSCULAR | Status: DC | PRN
  Administered 2013-11-23 (×2): 50 ug via INTRAVENOUS

## 2013-11-23 MED ORDER — PROPOFOL 10 MG/ML IV BOLUS
INTRAVENOUS | Status: DC | PRN
  Administered 2013-11-23: 150 mg via INTRAVENOUS

## 2013-11-23 MED ORDER — DEXAMETHASONE SODIUM PHOSPHATE 4 MG/ML IJ SOLN
4.0000 mg | Freq: Once | INTRAMUSCULAR | Status: AC
  Administered 2013-11-23: 4 mg via INTRAVENOUS

## 2013-11-23 MED ORDER — ONDANSETRON HCL 4 MG/2ML IJ SOLN: 4.0000 mg | Freq: Once | INTRAMUSCULAR | Status: DC | PRN

## 2013-11-23 MED ORDER — LACTATED RINGERS IV SOLN
INTRAVENOUS | Status: DC
  Administered 2013-11-23: 1000 mL via INTRAVENOUS
  Administered 2013-11-23: 09:00:00 via INTRAVENOUS

## 2013-11-23 MED ORDER — GLYCOPYRROLATE 0.2 MG/ML IJ SOLN
INTRAMUSCULAR | Status: AC
  Filled 2013-11-23: qty 1

## 2013-11-23 MED ORDER — STERILE WATER FOR IRRIGATION IR SOLN
Status: DC | PRN
  Administered 2013-11-23: 2000 mL

## 2013-11-23 MED ORDER — HYDROMORPHONE HCL PF 1 MG/ML IJ SOLN
1.0000 mg | INTRAMUSCULAR | Status: DC | PRN
  Administered 2013-11-23 (×2): 1 mg via INTRAVENOUS
  Filled 2013-11-23 (×2): qty 1

## 2013-11-23 MED ORDER — MELATONIN 300 MCG PO TABS: 2.0000 | ORAL_TABLET | Freq: Every evening | ORAL | Status: DC | PRN

## 2013-11-23 MED ORDER — POTASSIUM CHLORIDE IN NACL 20-0.9 MEQ/L-% IV SOLN
INTRAVENOUS | Status: DC
  Administered 2013-11-23: 75 mL/h via INTRAVENOUS
  Administered 2013-11-24: 02:00:00 via INTRAVENOUS

## 2013-11-23 MED ORDER — LIDOCAINE HCL 1 % IJ SOLN
INTRAMUSCULAR | Status: DC | PRN
  Administered 2013-11-23: 30 mg via INTRADERMAL

## 2013-11-23 MED ORDER — ONDANSETRON HCL 4 MG/2ML IJ SOLN
INTRAMUSCULAR | Status: AC
  Filled 2013-11-23: qty 2

## 2013-11-23 MED ORDER — DOCUSATE SODIUM 100 MG PO CAPS
100.0000 mg | ORAL_CAPSULE | Freq: Every day | ORAL | Status: DC
  Administered 2013-11-23 – 2013-11-24 (×2): 100 mg via ORAL
  Filled 2013-11-23 (×2): qty 1

## 2013-11-23 MED ORDER — CEFAZOLIN SODIUM-DEXTROSE 2-3 GM-% IV SOLR
2.0000 g | Freq: Once | INTRAVENOUS | Status: AC
  Administered 2013-11-23: 2 g via INTRAVENOUS
  Filled 2013-11-23: qty 50

## 2013-11-23 MED ORDER — MIDAZOLAM HCL 2 MG/2ML IJ SOLN
1.0000 mg | INTRAMUSCULAR | Status: DC | PRN
  Administered 2013-11-23: 2 mg via INTRAVENOUS

## 2013-11-23 MED ORDER — LIDOCAINE HCL (PF) 1 % IJ SOLN
INTRAMUSCULAR | Status: AC
  Filled 2013-11-23: qty 5

## 2013-11-23 SURGICAL SUPPLY — 46 items
ATTRACTOMAT 16X20 MAGNETIC DRP (DRAPES) ×2 IMPLANT
BAG HAMPER (MISCELLANEOUS) ×2 IMPLANT
CLOTH BEACON ORANGE TIMEOUT ST (SAFETY) ×2 IMPLANT
COVER LIGHT HANDLE STERIS (MISCELLANEOUS) ×4 IMPLANT
DECANTER SPIKE VIAL GLASS SM (MISCELLANEOUS) ×2 IMPLANT
DRAIN PENROSE 12X.25 LTX STRL (MISCELLANEOUS) ×2 IMPLANT
DRSG MEPILEX BORDER 4X8 (GAUZE/BANDAGES/DRESSINGS) IMPLANT
ELECT REM PT RETURN 9FT ADLT (ELECTROSURGICAL) ×2
ELECTRODE REM PT RTRN 9FT ADLT (ELECTROSURGICAL) ×1 IMPLANT
FORMALIN 10 PREFIL 120ML (MISCELLANEOUS) ×2 IMPLANT
GLOVE BIOGEL PI IND STRL 7.0 (GLOVE) IMPLANT
GLOVE BIOGEL PI INDICATOR 7.0 (GLOVE) ×2
GLOVE ECLIPSE 6.5 STRL STRAW (GLOVE) ×1 IMPLANT
GLOVE EXAM NITRILE MD LF STRL (GLOVE) ×1 IMPLANT
GLOVE OPTIFIT SS 6.5 STRL BRWN (GLOVE) ×1 IMPLANT
GLOVE SKINSENSE NS SZ7.0 (GLOVE) ×1
GLOVE SKINSENSE STRL SZ7.0 (GLOVE) ×1 IMPLANT
GOWN STRL REIN XL XLG (GOWN DISPOSABLE) ×6 IMPLANT
INST SET MINOR GENERAL (KITS) ×2 IMPLANT
KIT ROOM TURNOVER APOR (KITS) ×2 IMPLANT
MANIFOLD NEPTUNE II (INSTRUMENTS) ×2 IMPLANT
NDL HYPO 25X1 1.5 SAFETY (NEEDLE) ×1 IMPLANT
NEEDLE HYPO 25X1 1.5 SAFETY (NEEDLE) ×2 IMPLANT
NS IRRIG 1000ML POUR BTL (IV SOLUTION) ×2 IMPLANT
PACK MINOR (CUSTOM PROCEDURE TRAY) ×2 IMPLANT
PAD ARMBOARD 7.5X6 YLW CONV (MISCELLANEOUS) ×2 IMPLANT
SET BASIN LINEN APH (SET/KITS/TRAYS/PACK) ×2 IMPLANT
SOL PREP PROV IODINE SCRUB 4OZ (MISCELLANEOUS) ×2 IMPLANT
SPONGE GAUZE 4X4 12PLY (GAUZE/BANDAGES/DRESSINGS) ×2 IMPLANT
SPONGE INTESTINAL PEANUT (DISPOSABLE) ×2 IMPLANT
SPONGE LAP 18X18 X RAY DECT (DISPOSABLE) ×2 IMPLANT
STAPLER VISISTAT 35W (STAPLE) ×2 IMPLANT
SUT NOVA NAB GS-22 2 2-0 T-19 (SUTURE) IMPLANT
SUT NUROLON NAB CT 2 2-0 18IN (SUTURE) ×1 IMPLANT
SUT PROLENE 0 CT 1 CR/8 (SUTURE) IMPLANT
SUT SILK 2 0 (SUTURE) ×2
SUT SILK 2 0 SH (SUTURE) ×1 IMPLANT
SUT SILK 2-0 18XBRD TIE 12 (SUTURE) ×1 IMPLANT
SUT VIC AB 3-0 SH 27 (SUTURE) ×2
SUT VIC AB 3-0 SH 27X BRD (SUTURE) ×1 IMPLANT
SUT VICRYL AB 3 0 TIES (SUTURE) ×2 IMPLANT
SYR BULB IRRIGATION 50ML (SYRINGE) ×2 IMPLANT
SYR CONTROL 10ML LL (SYRINGE) ×2 IMPLANT
TAPE CLOTH SURG 4X10 WHT LF (GAUZE/BANDAGES/DRESSINGS) ×1 IMPLANT
TRAY FOLEY CATH 16FR SILVER (SET/KITS/TRAYS/PACK) ×2 IMPLANT
WATER STERILE IRR 1000ML POUR (IV SOLUTION) ×4 IMPLANT

## 2013-11-23 NOTE — Anesthesia Preprocedure Evaluation (Signed)
Anesthesia Evaluation  Patient identified by MRN, date of birth, ID band Patient awake    Reviewed: Allergy & Precautions, H&P , NPO status , Patient's Chart, lab work & pertinent test results  Airway Mallampati: II TM Distance: >3 FB Neck ROM: Full    Dental  (+) Teeth Intact   Pulmonary neg pulmonary ROS,  breath sounds clear to auscultation        Cardiovascular negative cardio ROS  Rhythm:Regular Rate:Normal     Neuro/Psych    GI/Hepatic GERD-  Medicated and Controlled,  Endo/Other    Renal/GU      Musculoskeletal   Abdominal   Peds  Hematology   Anesthesia Other Findings   Reproductive/Obstetrics                           Anesthesia Physical Anesthesia Plan  ASA: II  Anesthesia Plan: General   Post-op Pain Management:    Induction: Intravenous, Rapid sequence and Cricoid pressure planned  Airway Management Planned: Oral ETT  Additional Equipment:   Intra-op Plan:   Post-operative Plan: Extubation in OR  Informed Consent: I have reviewed the patients History and Physical, chart, labs and discussed the procedure including the risks, benefits and alternatives for the proposed anesthesia with the patient or authorized representative who has indicated his/her understanding and acceptance.     Plan Discussed with:   Anesthesia Plan Comments:         Anesthesia Quick Evaluation

## 2013-11-23 NOTE — Progress Notes (Signed)
Post OP Check  Pt awake and alert.  Dressings dry and in tact. Has voided and has considerable incisional pain, but this is under control with current meds.  Filed Vitals:   11/23/13 1132  BP: 122/81  Pulse: 89  Temp: 98.5 F (36.9 C)  Resp: 18

## 2013-11-23 NOTE — Brief Op Note (Signed)
11/23/2013  9:33 AM  PATIENT:  [name]  52 y.o. male  PRE-OPERATIVE DIAGNOSIS:  right inguinal hernia  POST-OPERATIVE DIAGNOSIS:  right inguinal hernia  PROCEDURE:  Procedure(s) with comments: HERNIA REPAIR INGUINAL ADULT (Right) - site-inguinal area  SURGEON:  Surgeon(s) and Role:    * Marlane Hatcher, MD - Primary  PHYSICIAN ASSISTANT:   ASSISTANTS: none   ANESTHESIA:   general  EBL:  Total I/O In: 1000 [I.V.:1000] Out: -   BLOOD ADMINISTERED:none  DRAINS: none   LOCAL MEDICATIONS USED:  MARCAINE  0.5% ~ 20 cc SPECIMEN:  Source of Specimen:  Right inguinal hernia sac. lipoma of cord.  DISPOSITION OF SPECIMEN:  PATHOLOGY  COUNTS:  YES  TOURNIQUET:  * No tourniquets in log *  DICTATION: .Other Dictation: Dictation Number OR dict. # [id number].  PLAN OF CARE: Admit for overnight observation  PATIENT DISPOSITION:  PACU - hemodynamically stable.   Delay start of Pharmacological VTE agent (>24hrs) due to surgical blood loss or risk of bleeding: not applicable

## 2013-11-23 NOTE — Anesthesia Procedure Notes (Signed)
Procedure Name: Intubation Date/Time: 11/23/2013 7:45 AM Performed by: Glynn Octave E Pre-anesthesia Checklist: Patient identified, Patient being monitored, Timeout performed, Emergency Drugs available and Suction available Patient Re-evaluated:Patient Re-evaluated prior to inductionOxygen Delivery Method: Circle System Utilized Preoxygenation: Pre-oxygenation with 100% oxygen Intubation Type: IV induction, Rapid sequence and Cricoid Pressure applied Ventilation: Mask ventilation without difficulty Laryngoscope Size: Mac and 3 Grade View: Grade I Tube type: Oral Tube size: 7.0 mm Number of attempts: 1 Airway Equipment and Method: stylet Placement Confirmation: ETT inserted through vocal cords under direct vision,  positive ETCO2 and breath sounds checked- equal and bilateral Secured at: 21 cm Tube secured with: Tape Dental Injury: Teeth and Oropharynx as per pre-operative assessment

## 2013-11-23 NOTE — Progress Notes (Signed)

## 2013-11-23 NOTE — Progress Notes (Signed)
Admit 52 yr. Old W. Male for elective repair of right inguinal hernia.  Procedure explained and risks addressed and consent obtained.  Labs reviewed and surgical site marked.  No clinical change since H&P.  Dict. # [id number].  Filed Vitals:   11/23/13 0715  BP: 129/90  Pulse:   Temp:   Resp: 20  pulse 77/min, temp. 98.2, O2 sat 98%.

## 2013-11-23 NOTE — Transfer of Care (Signed)
Immediate Anesthesia Transfer of Care Note  Patient: [name]  Procedure(s) Performed: Procedure(s) with comments: HERNIA REPAIR INGUINAL ADULT (Right) - site-inguinal area  Patient Location: PACU  Anesthesia Type:General  Level of Consciousness: awake  Airway & Oxygen Therapy: Patient Spontanous Breathing and Patient connected to face mask oxygen  Post-op Assessment: Report given to PACU RN  Post vital signs: Reviewed  Complications: No apparent anesthesia complications

## 2013-11-23 NOTE — Anesthesia Postprocedure Evaluation (Signed)
  Anesthesia Post-op Note  Patient: [name]  Procedure(s) Performed: Procedure(s) with comments: HERNIA REPAIR INGUINAL ADULT (Right) - site-inguinal area  Patient Location: PACU  Anesthesia Type:General  Level of Consciousness: awake, alert  and oriented  Airway and Oxygen Therapy: Patient Spontanous Breathing  Post-op Pain: none  Post-op Assessment: Post-op Vital signs reviewed, Patient's Cardiovascular Status Stable, Respiratory Function Stable, Patent Airway and No signs of Nausea or vomiting  Post-op Vital Signs: Reviewed and stable  Complications: No apparent anesthesia complications

## 2013-11-24 MED ORDER — OXYCODONE-ACETAMINOPHEN 5-325 MG PO TABS: 1.0000 | ORAL_TABLET | ORAL | Status: DC | PRN

## 2013-11-24 MED ORDER — DSS 100 MG PO CAPS: 100.0000 mg | ORAL_CAPSULE | Freq: Every day | ORAL | Status: DC

## 2013-11-24 NOTE — Op Note (Signed)
NAMEGASPER, HOPES NO.:  [id number]  MEDICAL RECORD NO.:  [id number]  LOCATION:  A305                          FACILITY:  [facility]  PHYSICIAN:  Barbaraann Barthel, M.D. DATE OF BIRTH:  [birth year]  DATE OF PROCEDURE:  11/23/2013 DATE OF DISCHARGE:                              OPERATIVE REPORT   DIAGNOSIS:  Right inguinal hernia (direct and indirect component).  PROCEDURE:  Right inguinal herniorrhaphy without mesh using (modified McVay repair).  NOTE:  This is a 52 year old white male who presented with a long-term groin discomfort that had been increasing in size and clinically he had a rather large, non-incarcerated right inguinal hernia.  We discussed the need for repair, discussing complications, not limited to, but including bleeding, infection, and recurrence.  Informed consent was obtained.  SPECIMEN:  Right inguinal hernia sac with lipomatous tissue from the cord and pro-peritoneum.  TECHNIQUE:  The patient was placed in the supine position after the adequate administration of general anesthesia via endotracheal intubation, his entire abdomen was prepped with Betadine solution and draped in the usual manner.  Prior to this, a Foley catheter was aseptically inserted.  An incision was carried out between the anterior- superior iliac spine and the pubic tubercle through skin, subcutaneous tissue, down to the external oblique, which was opened through the external ring.  The cord structures were dissected free from attenuated hernia sac with a great deal of fatty inclusions.  This was dissected free.  The hernias sac was closed with a purse-string suture and oversewn with 2-0 silk as well.  The direct defect was then closed with suturing transversus abdominis and transversalis fascia to Cooper ligament and Poupart ligament with interrupted 2-0 Bralon sutures.  A relaxing incision was carried out prior to cinching these tightly. After this was accomplished,  we then used approximately 20 mL to help with postoperative discomfort with 0.5% Sensorcaine.  The cord structures were returned to their anatomic position, and the external oblique was repaired over them using 3-0 Polysorb.  The skin was approximated with stapling device.  Prior to closure, all sponge, needle, and instrument counts found to be correct.  Estimated blood loss was minimal.  The patient received about 1100 mL of crystalloids intraoperatively.  There were no complications.     Barbaraann Barthel, M.D.     WB/MEDQ  D:  11/23/2013  T:  11/24/2013  Job:  [id number]  cc:   Angus G. Renard Matter, MD Fax: [phone]

## 2013-11-24 NOTE — Progress Notes (Signed)
POD #1  Filed Vitals:   11/24/13 0604  BP: 131/87  Pulse: 79  Temp: 98.2 F (36.8 C)  Resp: 18   Wound clean and redressed.  Pt with some incisional pain but controled well.  Voiding without dysuria.  No leg pain or shortness of breath or chest pain.  Discharge and follow up arranged.  Discharge dict. # [id number].

## 2013-11-24 NOTE — Anesthesia Postprocedure Evaluation (Signed)
  Anesthesia Post-op Note  Patient: [name]  Procedure(s) Performed: Procedure(s) with comments: HERNIA REPAIR INGUINAL ADULT (Right) - site-inguinal area  Patient Location: room 305  Anesthesia Type:General  Level of Consciousness: awake, alert , oriented and patient cooperative  Airway and Oxygen Therapy: Patient Spontanous Breathing  Post-op Pain: none  Post-op Assessment: Post-op Vital signs reviewed, Patient's Cardiovascular Status Stable, Respiratory Function Stable, Patent Airway and Pain level controlled  Post-op Vital Signs: Reviewed and stable  Complications: No apparent anesthesia complications

## 2013-11-24 NOTE — Progress Notes (Signed)
Pt discharged with instructions, prescriptions, and care notes.  The patient verbalized understanding.  The pt left the floor via w/c with staff and family in stable condition.

## 2013-11-25 NOTE — Discharge Summary (Signed)
NAMEALVESTER, EADS NO.:  [id number]  MEDICAL RECORD NO.:  [id number]  LOCATION:  A305                          FACILITY:  [facility]  PHYSICIAN:  Barbaraann Barthel, M.D. DATE OF BIRTH:  03-Dec-1961  DATE OF ADMISSION:  11/23/2013 DATE OF DISCHARGE:  12/20/2014LH                              DISCHARGE SUMMARY   DIAGNOSIS:  Right inguinal hernia.  PROCEDURE:  On November 23, 2013, right inguinal herniorrhaphy (modified McVay repair without mesh).  NOTE:  This is a 52 year old white male who was admitted via the outpatient department for an elective right inguinal herniorrhaphy. This was carried out uneventfully via the outpatient department.  This was quite a large defect, so we kept him overnight to make sure he was able to void and had no problems with his incision or any other problems and his hospital course was otherwise uneventful.  He was discharged on the first postoperative day at which time, his wound was clean.  He was voiding without dysuria.  He had no leg pain shortness of breath or chest pain.  We made followup arrangements to see him perioperatively after which he is to return to Dr. Renard Matter for any problems he may have in the future.     Barbaraann Barthel, M.D.     WB/MEDQ  D:  11/24/2013  T:  11/25/2013  Job:  [id number]  cc:   Angus G. Renard Matter, MD Fax: [phone]

## 2013-11-26 ENCOUNTER — Encounter (HOSPITAL_COMMUNITY): Payer: Self-pay | Admitting: General Surgery

## 2014-01-11 ENCOUNTER — Encounter (HOSPITAL_COMMUNITY): Payer: Self-pay | Admitting: General Surgery

## 2014-06-02 ENCOUNTER — Encounter (HOSPITAL_COMMUNITY): Payer: Self-pay | Admitting: Emergency Medicine

## 2014-06-02 ENCOUNTER — Emergency Department (HOSPITAL_COMMUNITY)
Admission: EM | Admit: 2014-06-02 | Discharge: 2014-06-02 | Disposition: A | Discharge status: Home / Self Care | Payer: BC Managed Care – PPO | Attending: Emergency Medicine | Admitting: Emergency Medicine

## 2014-06-02 DIAGNOSIS — Z79899 Other long term (current) drug therapy: Secondary | ICD-10-CM | POA: Insufficient documentation

## 2014-06-02 DIAGNOSIS — R42 Dizziness and giddiness: Secondary | ICD-10-CM

## 2014-06-02 DIAGNOSIS — K219 Gastro-esophageal reflux disease without esophagitis: Secondary | ICD-10-CM | POA: Insufficient documentation

## 2014-06-02 LAB — CBC WITH DIFFERENTIAL/PLATELET
BASOS PCT: 0 % (ref 0–1)
Basophils Absolute: 0 10*3/uL (ref 0.0–0.1)
Eosinophils Absolute: 0 10*3/uL (ref 0.0–0.7)
Eosinophils Relative: 0 % (ref 0–5)
HEMATOCRIT: 48.6 % (ref 39.0–52.0)
HEMOGLOBIN: 17.6 g/dL — AB (ref 13.0–17.0)
LYMPHS PCT: 19 % (ref 12–46)
Lymphs Abs: 1.5 10*3/uL (ref 0.7–4.0)
MCH: 34.4 pg — ABNORMAL HIGH (ref 26.0–34.0)
MCHC: 36.2 g/dL — AB (ref 30.0–36.0)
MCV: 94.9 fL (ref 78.0–100.0)
MONO ABS: 0.5 10*3/uL (ref 0.1–1.0)
Monocytes Relative: 6 % (ref 3–12)
NEUTROS ABS: 5.6 10*3/uL (ref 1.7–7.7)
NEUTROS PCT: 75 % (ref 43–77)
Platelets: 215 10*3/uL (ref 150–400)
RBC: 5.12 MIL/uL (ref 4.22–5.81)
RDW: 12.1 % (ref 11.5–15.5)
WBC: 7.5 10*3/uL (ref 4.0–10.5)

## 2014-06-02 LAB — COMPREHENSIVE METABOLIC PANEL
ALBUMIN: 4 g/dL (ref 3.5–5.2)
ALK PHOS: 86 U/L (ref 39–117)
ALT: 29 U/L (ref 0–53)
AST: 26 U/L (ref 0–37)
BILIRUBIN TOTAL: 0.8 mg/dL (ref 0.3–1.2)
BUN: 11 mg/dL (ref 6–23)
CHLORIDE: 98 meq/L (ref 96–112)
CO2: 27 meq/L (ref 19–32)
CREATININE: 0.78 mg/dL (ref 0.50–1.35)
Calcium: 9.1 mg/dL (ref 8.4–10.5)
GFR calc Af Amer: >90 mL/min (ref >90)
Glucose, Bld: 99 mg/dL (ref 70–99)
POTASSIUM: 3.8 meq/L (ref 3.7–5.3)
Sodium: 137 mEq/L (ref 137–147)
Total Protein: 7.3 g/dL (ref 6.0–8.3)

## 2014-06-02 MED ORDER — MECLIZINE HCL 12.5 MG PO TABS
25.0000 mg | ORAL_TABLET | Freq: Once | ORAL | Status: AC
  Administered 2014-06-02: 25 mg via ORAL
  Filled 2014-06-02: qty 2

## 2014-06-02 MED ORDER — SODIUM CHLORIDE 0.9 % IV BOLUS (SEPSIS)
1000.0000 mL | Freq: Once | INTRAVENOUS | Status: AC
  Administered 2014-06-02: 1000 mL via INTRAVENOUS

## 2014-06-02 MED ORDER — LORAZEPAM 2 MG/ML IJ SOLN
0.5000 mg | Freq: Once | INTRAMUSCULAR | Status: AC
  Administered 2014-06-02: 0.5 mg via INTRAVENOUS
  Filled 2014-06-02: qty 1

## 2014-06-02 MED ORDER — LORAZEPAM 1 MG PO TABS: 1.0000 mg | ORAL_TABLET | Freq: Three times a day (TID) | ORAL | Status: DC | PRN

## 2014-06-02 MED ORDER — MECLIZINE HCL 25 MG PO TABS: 25.0000 mg | ORAL_TABLET | Freq: Three times a day (TID) | ORAL | Status: AC

## 2014-06-02 NOTE — ED Notes (Signed)
Pt states that when he bends forward or stands up he feels dizzy or light headed.

## 2014-06-02 NOTE — ED Provider Notes (Signed)
CSN: [id number]     Arrival date & time 06/02/14  1919 History   First MD Initiated Contact with Patient 06/02/14 1947     Chief Complaint  Patient presents with  . Dizziness     (Consider location/radiation/quality/duration/timing/severity/associated sxs/prior Treatment) HPI Patient presents with new dizziness. Symptoms began without clear precipitant approximately 4 hours prior to my evaluation.  Patient notes the sudden onset of disequilibrium, nausea, with no vomiting, no falling, no confusion, disorientation, asymmetric weakness. Patient is over the past few days he has had mild sensation of disequilibrium, though nothing as severe as today. Patient denies ongoing pain, near syncope, syncope, chest pain.  Past Medical History  Diagnosis Date  . GERD (gastroesophageal reflux disease)    Past Surgical History  Procedure Laterality Date  . Wrist surgery Right     otif  . Hand surgery Right     contracture of hand  . Inguinal hernia repair Right 11/23/2013    Procedure: HERNIA REPAIR INGUINAL ADULT;  Surgeon: Scherry Ran, MD;  Location: [facility];  Service: General;  Laterality: Right;  site-inguinal area   History reviewed. No pertinent family history. History  Substance Use Topics  . Smoking status: Never Smoker   . Smokeless tobacco: Not on file  . Alcohol Use: Yes     Comment: occ    Review of Systems  Constitutional:       Per HPI, otherwise negative  HENT:       Per HPI, otherwise negative  Respiratory:       Per HPI, otherwise negative  Cardiovascular:       Per HPI, otherwise negative  Gastrointestinal: Negative for vomiting.  Endocrine:       Negative aside from HPI  Genitourinary:       Neg aside from HPI   Musculoskeletal:       Per HPI, otherwise negative  Skin: Negative.   Neurological: Negative for syncope.      Allergies  Ibuprofen; Morphine and related; and Bee venom  Home Medications   Prior to Admission medications   Medication  Sig Start Date End Date Taking? Authorizing Sreeja Spies  docusate sodium 100 MG CAPS Take 100 mg by mouth daily. 11/24/13   Scherry Ran, MD  esomeprazole (NEXIUM) 40 MG capsule Take 40 mg by mouth daily.    Historical Alegandra Sommers, MD  Melatonin 300 MCG TABS Take 2 tablets by mouth at bedtime as needed (sleep).     Historical Kailana Benninger, MD  oxyCODONE-acetaminophen (PERCOCET/ROXICET) 5-325 MG per tablet Take 1 tablet by mouth every 4 (four) hours as needed for severe pain. 11/24/13   Scherry Ran, MD   BP 127/84  Pulse 72  Temp(Src) 98 F (36.7 C)  Resp 20  Ht 5\' 5"  (1.651 m)  Wt 180 lb (81.647 kg)  BMI 29.95 kg/m2  SpO2 95% Physical Exam  Nursing note and vitals reviewed. Constitutional: He is oriented to person, place, and time. He appears well-developed. No distress.  HENT:  Head: Normocephalic and atraumatic.  Eyes: Conjunctivae and EOM are normal.  Cardiovascular: Normal rate and regular rhythm.   Pulmonary/Chest: Effort normal. No stridor. No respiratory distress.  Abdominal: He exhibits no distension.  Musculoskeletal: He exhibits no edema.  Neurological: He is alert and oriented to person, place, and time. No cranial nerve deficit. He exhibits normal muscle tone. Coordination normal.  No cerebellar deficits, no nystagmus, no facial asymmetry, speech deficits.  Skin: Skin is warm and dry.  Psychiatric: He has  a normal mood and affect.    ED Course  Procedures (including critical care time) Labs Review All labs reviewed  9:35 PM Patient states " I feel like it's passing."   No new complaints, no new concerns.  Return precautions, follow up instructions discussed MDM     Patient presents with dizziness, no neurologic deficits, no other focal neurologic complaints, and on exam he is awake, alert, hemodynamically stable, and in no distress. Patient's no evidence of central nervous system pathology. With return of labs, improvement of symptoms, he was discharged  to follow up with primary care after initiation of therapy.  Carmin Muskrat, MD 06/02/14 2135

## 2014-06-02 NOTE — Discharge Instructions (Signed)
As discussed, your symptoms are likely due to vertigo.  It is important to take all medication as directed, and follow up with her primary care physician for appropriate ongoing evaluation and management of your condition.  Return here for concerning changes in your condition.   Dizziness Dizziness is a common problem. It is a feeling of unsteadiness or light-headedness. You may feel like you are about to faint. Dizziness can lead to injury if you stumble or fall. A person of any age group can suffer from dizziness, but dizziness is more common in older adults. CAUSES  Dizziness can be caused by many different things, including:  Middle ear problems.  Standing for too long.  Infections.  An allergic reaction.  Aging.  An emotional response to something, such as the sight of blood.  Side effects of medicines.  Tiredness.  Problems with circulation or blood pressure.  Excessive use of alcohol or medicines, or illegal drug use.  Breathing too fast (hyperventilation).  An irregular heart rhythm (arrhythmia).  A low red blood cell count (anemia).  Pregnancy.  Vomiting, diarrhea, fever, or other illnesses that cause body fluid loss (dehydration).  Diseases or conditions such as Parkinson's disease, high blood pressure (hypertension), diabetes, and thyroid problems.  Exposure to extreme heat. DIAGNOSIS  Your health care provider will ask about your symptoms, perform a physical exam, and perform an electrocardiogram (ECG) to record the electrical activity of your heart. Your health care provider may also perform other heart or blood tests to determine the cause of your dizziness. These may include:  Transthoracic echocardiogram (TTE). During echocardiography, sound waves are used to evaluate how blood flows through your heart.  Transesophageal echocardiogram (TEE).  Cardiac monitoring. This allows your health care provider to monitor your heart rate and rhythm in real  time.  Holter monitor. This is a portable device that records your heartbeat and can help diagnose heart arrhythmias. It allows your health care provider to track your heart activity for several days if needed.  Stress tests by exercise or by giving medicine that makes the heart beat faster. TREATMENT  Treatment of dizziness depends on the cause of your symptoms and can vary greatly. HOME CARE INSTRUCTIONS   Drink enough fluids to keep your urine clear or pale yellow. This is especially important in very hot weather. In older adults, it is also important in cold weather.  Take your medicine exactly as directed if your dizziness is caused by medicines. When taking blood pressure medicines, it is especially important to get up slowly.  Rise slowly from chairs and steady yourself until you feel okay.  In the morning, first sit up on the side of the bed. When you feel okay, stand slowly while holding onto something until you know your balance is fine.  Move your legs often if you need to stand in one place for a long time. Tighten and relax your muscles in your legs while standing.  Have someone stay with you for 1-2 days if dizziness continues to be a problem. Do this until you feel you are well enough to stay alone. Have the person call your health care provider if he or she notices changes in you that are concerning.  Do not drive or use heavy machinery if you feel dizzy.  Do not drink alcohol. SEEK IMMEDIATE MEDICAL CARE IF:   Your dizziness or light-headedness gets worse.  You feel nauseous or vomit.  You have problems talking, walking, or using your arms, hands, or  legs.  You feel weak.  You are not thinking clearly or you have trouble forming sentences. It may take a friend or family member to notice this.  You have chest pain, abdominal pain, shortness of breath, or sweating.  Your vision changes.  You notice any bleeding.  You have side effects from medicine that seems  to be getting worse rather than better. MAKE SURE YOU:   Understand these instructions.  Will watch your condition.  Will get help right away if you are not doing well or get worse. Document Released: 05/18/2001 Document Revised: 11/27/2013 Document Reviewed: 06/11/2011 [facility] Patient Information 2015 [place], [place]. This information is not intended to replace advice given to you by your health care provider. Make sure you discuss any questions you have with your health care provider.

## 2014-06-05 ENCOUNTER — Emergency Department (HOSPITAL_COMMUNITY): Payer: BC Managed Care – PPO

## 2014-06-05 ENCOUNTER — Encounter (HOSPITAL_COMMUNITY): Payer: Self-pay | Admitting: Emergency Medicine

## 2014-06-05 ENCOUNTER — Emergency Department (HOSPITAL_COMMUNITY)
Admission: EM | Admit: 2014-06-05 | Discharge: 2014-06-06 | Disposition: A | Discharge status: Home / Self Care | Payer: BC Managed Care – PPO | Attending: Emergency Medicine | Admitting: Emergency Medicine

## 2014-06-05 DIAGNOSIS — Z7982 Long term (current) use of aspirin: Secondary | ICD-10-CM | POA: Insufficient documentation

## 2014-06-05 DIAGNOSIS — R55 Syncope and collapse: Secondary | ICD-10-CM | POA: Insufficient documentation

## 2014-06-05 DIAGNOSIS — K219 Gastro-esophageal reflux disease without esophagitis: Secondary | ICD-10-CM | POA: Insufficient documentation

## 2014-06-05 DIAGNOSIS — M6281 Muscle weakness (generalized): Secondary | ICD-10-CM | POA: Insufficient documentation

## 2014-06-05 DIAGNOSIS — Z79899 Other long term (current) drug therapy: Secondary | ICD-10-CM | POA: Insufficient documentation

## 2014-06-05 DIAGNOSIS — R29898 Other symptoms and signs involving the musculoskeletal system: Secondary | ICD-10-CM

## 2014-06-05 LAB — CBC WITH DIFFERENTIAL/PLATELET
Basophils Absolute: 0 10*3/uL (ref 0.0–0.1)
Basophils Relative: 0 % (ref 0–1)
Eosinophils Absolute: 0 10*3/uL (ref 0.0–0.7)
Eosinophils Relative: 1 % (ref 0–5)
HCT: 47.5 % (ref 39.0–52.0)
HEMOGLOBIN: 17.3 g/dL — AB (ref 13.0–17.0)
LYMPHS ABS: 2.4 10*3/uL (ref 0.7–4.0)
Lymphocytes Relative: 27 % (ref 12–46)
MCH: 34.4 pg — ABNORMAL HIGH (ref 26.0–34.0)
MCHC: 36.4 g/dL — AB (ref 30.0–36.0)
MCV: 94.4 fL (ref 78.0–100.0)
MONOS PCT: 7 % (ref 3–12)
Monocytes Absolute: 0.6 10*3/uL (ref 0.1–1.0)
NEUTROS ABS: 5.7 10*3/uL (ref 1.7–7.7)
Neutrophils Relative %: 65 % (ref 43–77)
Platelets: 227 10*3/uL (ref 150–400)
RBC: 5.03 MIL/uL (ref 4.22–5.81)
RDW: 12.3 % (ref 11.5–15.5)
WBC: 8.7 10*3/uL (ref 4.0–10.5)

## 2014-06-05 NOTE — ED Notes (Signed)
Pt c/o having syncopal episode at work tonight passing out at his [employer]. Pt states he was recently diagnosed with vertigo.

## 2014-06-05 NOTE — ED Provider Notes (Signed)
CSN: [id number]     Arrival date & time 06/05/14  2302 History  This chart was scribed for Hoy Morn, MD by Lowella Petties, ED Scribe. The patient was seen in room APA14/AP[facility]14. Patient's care was started at 11:15 PM.     Chief Complaint  Patient presents with  . Loss of Consciousness   The history is provided by the patient. No language interpreter was used.   HPI Comments: [name] is a 53 y.o. male who presents to the Emergency Department complaining of a loss of consciousness earlier tonight. He states that he went to work feeling normal, but he experienced gradually worsening neck pain until he fell to the floor. He states that he awoke on his back. He denies memory of the fall. He reports that he is still experiencing neck pain, and reports weakness in his left upper and lower extremeites. He reports numbness in his LLE onset 7pm. He repots a history of these symptoms and was seen here for similar symptoms in 2013. He reports being seen here for dizziness 4 days ago. He denies HA, chest pain, nausea, vomiting, and diarrhea. He denies history of stroke, DM, high cholesterol, HTN. He reports having an epidural for pain at [facility] in 2011.  Past Medical History  Diagnosis Date  . GERD (gastroesophageal reflux disease)    Past Surgical History  Procedure Laterality Date  . Wrist surgery Right     otif  . Hand surgery Right     contracture of hand  . Inguinal hernia repair Right 11/23/2013    Procedure: HERNIA REPAIR INGUINAL ADULT;  Surgeon: Scherry Ran, MD;  Location: [facility];  Service: General;  Laterality: Right;  site-inguinal area   No family history on file. History  Substance Use Topics  . Smoking status: Never Smoker   . Smokeless tobacco: Not on file  . Alcohol Use: Yes     Comment: occ    Review of Systems A complete 10 system review of systems was obtained and all systems are negative except as noted in the HPI and PMH.     Allergies   Ibuprofen; Morphine and related; and Bee venom  Home Medications   Prior to Admission medications   Medication Sig Start Date End Date Taking? Authorizing Provider  aspirin 81 MG chewable tablet Chew 1 tablet (81 mg total) by mouth once. 06/06/14   Hoy Morn, MD  esomeprazole (NEXIUM) 40 MG capsule Take 40 mg by mouth every morning.     Historical Provider, MD  LORazepam (ATIVAN) 1 MG tablet Take 1 tablet (1 mg total) by mouth 3 (three) times daily as needed (nausea / vomiting / dizziness). 06/02/14   Carmin Muskrat, MD  meclizine (ANTIVERT) 25 MG tablet Take 1 tablet (25 mg total) by mouth 3 (three) times daily. 06/02/14 06/09/14  Carmin Muskrat, MD   Triage Vitals: BP 126/95  Pulse 94  Temp(Src) 98.1 F (36.7 C) (Oral)  Resp 18  Ht 5\' 5"  (1.651 m)  Wt 180 lb (81.647 kg)  BMI 29.95 kg/m2  SpO2 95% Physical Exam  Nursing note and vitals reviewed. Constitutional: He is oriented to person, place, and time. He appears well-developed and well-nourished.  HENT:  Head: Normocephalic and atraumatic.  Eyes: EOM are normal.  Neck: Normal range of motion.  Cardiovascular: Normal rate, regular rhythm, normal heart sounds and intact distal pulses.   Pulmonary/Chest: Effort normal and breath sounds normal. No respiratory distress.  Abdominal: Soft. He  exhibits no distension. There is no tenderness.  Musculoskeletal: Normal range of motion.  Neurological: He is alert and oriented to person, place, and time.  5/5 strength bilateral upper extremity major muscle groups.   5-/5 left lower extremity major muscle groups.  5/5 right lower extremity major muscle groups.  Norma finger to nose bilaterally.   Skin: Skin is warm and dry.  Psychiatric: He has a normal mood and affect. Judgment normal.    ED Course  Procedures (including critical care time) Labs Review DIAGNOSTIC STUDIES: Oxygen Saturation is 95% on room air, adequate by my interpretation.    COORDINATION OF CARE: 11:20  PM-Discussed treatment plan which includes CT scan with pt at bedside and pt agreed to plan.   Labs Reviewed  CBC WITH DIFFERENTIAL - Abnormal; Notable for the following:    Hemoglobin 17.3 (*)    MCH 34.4 (*)    MCHC 36.4 (*)    All other components within normal limits  BASIC METABOLIC PANEL  TROPONIN I    Imaging Review Ct Head Wo Contrast  06/06/2014   CLINICAL DATA:  Neck pain.  EXAM: CT HEAD WITHOUT CONTRAST  TECHNIQUE: Contiguous axial images were obtained from the base of the skull through the vertex without intravenous contrast.  COMPARISON:  07/01/2012  FINDINGS: Prominence of the sulci and ventricles identified compatible with brain atrophy. No acute cortical infarct, hemorrhage, or mass lesion ispresent. Ventricles are of normal size. No significant extra-axial fluid collection is present. The paranasal sinuses andmastoid air cells are clear. The osseous skull is intact.  IMPRESSION: 1. No acute intracranial abnormalities. 2. Brain atrophy.   Electronically Signed   By: Kerby Moors M.D.   On: 06/06/2014 01:30     EKG Interpretation   Date/Time:  Wednesday June 05 2014 23:47:27 EDT Ventricular Rate:  84 PR Interval:  135 QRS Duration: 94 QT Interval:  371 QTC Calculation: 438 R Axis:   33 Text Interpretation:  Sinus rhythm Abnormal R-wave progression, early  transition No significant change was found Confirmed by Yenny Kosa  MD, Cataleya Cristina  ([id number]) on 06/06/2014 1:08:08 AM      MDM   Final diagnoses:  Syncope, unspecified syncope type  Transient weakness of left leg    Resolution of left leg weakness.  This could represent TIA.  The rather bizarre presentation given the fact that his major issue on arrival with syncope.  My suspicion for malignant arrhythmia is low.  Patient was placed on the monitor in the emergency department and his telemetry was reviewed.  His had no ectopy.  Labs, EKG normal.  CT head negative.  Patient later on emergency department by difficulty.  He  states his mild numbness or weakness of his left leg resolved.  Patient be placed on 81 mg aspirin.  He'll followup with his primary care physician and with neurology.  Patient understands to return to the ER for new or worsening symptoms  I personally performed the services described in this documentation, which was scribed in my presence. The recorded information has been reviewed and is accurate.      Hoy Morn, MD 06/06/14 [phone]

## 2014-06-06 ENCOUNTER — Encounter (HOSPITAL_COMMUNITY): Payer: Self-pay | Admitting: Emergency Medicine

## 2014-06-06 LAB — TROPONIN I

## 2014-06-06 LAB — BASIC METABOLIC PANEL
Anion gap: 14 (ref 5–15)
BUN: 7 mg/dL (ref 6–23)
CHLORIDE: 101 meq/L (ref 96–112)
CO2: 25 mEq/L (ref 19–32)
Calcium: 9.1 mg/dL (ref 8.4–10.5)
Creatinine, Ser: 0.77 mg/dL (ref 0.50–1.35)
GFR calc Af Amer: >90 mL/min (ref >90)
GFR calc non Af Amer: >90 mL/min (ref >90)
Glucose, Bld: 91 mg/dL (ref 70–99)
Potassium: 3.8 mEq/L (ref 3.7–5.3)
SODIUM: 140 meq/L (ref 137–147)

## 2014-06-06 MED ORDER — ASPIRIN 81 MG PO CHEW
81.0000 mg | CHEWABLE_TABLET | Freq: Once | ORAL | Status: AC
  Administered 2014-06-06: 81 mg via ORAL
  Filled 2014-06-06: qty 1

## 2014-06-06 MED ORDER — ASPIRIN 81 MG PO CHEW: 81.0000 mg | CHEWABLE_TABLET | Freq: Once | ORAL | Status: DC

## 2014-06-06 NOTE — ED Notes (Signed)
Ambulated easily. Denies any sob,dizziness,chest pain or unsteady gait

## 2014-06-06 NOTE — Discharge Instructions (Signed)
Syncope °Syncope is a medical term for fainting or passing out. This means you lose consciousness and drop to the ground. People are generally unconscious for less than 5 minutes. You may have some muscle twitches for up to 15 seconds before waking up and returning to normal. Syncope occurs more often in older adults, but it can happen to anyone. While most causes of syncope are not dangerous, syncope can be a sign of a serious medical problem. It is important to seek medical care.  °CAUSES  °Syncope is caused by a sudden drop in blood flow to the brain. The specific cause is often not determined. Factors that can bring on syncope include: °· Taking medicines that lower blood pressure. °· Sudden changes in posture, such as standing up quickly. °· Taking more medicine than prescribed. °· Standing in one place for too long. °· Seizure disorders. °· Dehydration and excessive exposure to heat. °· Low blood sugar (hypoglycemia). °· Straining to have a bowel movement. °· Heart disease, irregular heartbeat, or other circulatory problems. °· Fear, emotional distress, seeing blood, or severe pain. °SYMPTOMS  °Right before fainting, you may: °· Feel dizzy or light-headed. °· Feel nauseous. °· See all white or all black in your field of vision. °· Have cold, clammy skin. °DIAGNOSIS  °Your health care provider will ask about your symptoms, perform a physical exam, and perform an electrocardiogram (ECG) to record the electrical activity of your heart. Your health care provider may also perform other heart or blood tests to determine the cause of your syncope which may include: °· Transthoracic echocardiogram (TTE). During echocardiography, sound waves are used to evaluate how blood flows through your heart. °· Transesophageal echocardiogram (TEE). °· Cardiac monitoring. This allows your health care provider to monitor your heart rate and rhythm in real time. °· Holter monitor. This is a portable device that records your  heartbeat and can help diagnose heart arrhythmias. It allows your health care provider to track your heart activity for several days, if needed. °· Stress tests by exercise or by giving medicine that makes the heart beat faster. °TREATMENT  °In most cases, no treatment is needed. Depending on the cause of your syncope, your health care provider may recommend changing or stopping some of your medicines. °HOME CARE INSTRUCTIONS °· Have someone stay with you until you feel stable. °· Do not drive, use machinery, or play sports until your health care provider says it is okay. °· Keep all follow-up appointments as directed by your health care provider. °· Lie down right away if you start feeling like you might faint. Breathe deeply and steadily. Wait until all the symptoms have passed. °· Drink enough fluids to keep your urine clear or pale yellow. °· If you are taking blood pressure or heart medicine, get up slowly and take several minutes to sit and then stand. This can reduce dizziness. °SEEK IMMEDIATE MEDICAL CARE IF:  °· You have a severe headache. °· You have unusual pain in the chest, abdomen, or back. °· You are bleeding from your mouth or rectum, or you have black or tarry stool. °· You have an irregular or very fast heartbeat. °· You have pain with breathing. °· You have repeated fainting or seizure-like jerking during an episode. °· You faint when sitting or lying down. °· You have confusion. °· You have trouble walking. °· You have severe weakness. °· You have vision problems. °If you fainted, call your local emergency services (911 in [place] Do not drive   yourself to the hospital.  °MAKE SURE YOU: °· Understand these instructions. °· Will watch your condition. °· Will get help right away if you are not doing well or get worse. °Document Released: 11/22/2005 Document Revised: 11/27/2013 Document Reviewed: 01/21/2012 °ExitCare® Patient Information ©2015 [employer]. This information is not intended to replace  advice given to you by your health care provider. Make sure you discuss any questions you have with your health care provider. ° °

## 2014-09-18 ENCOUNTER — Ambulatory Visit (HOSPITAL_COMMUNITY)
Admission: RE | Admit: 2014-09-18 | Discharge: 2014-09-18 | Disposition: A | Discharge status: Home / Self Care | Payer: BC Managed Care – PPO | Source: Ambulatory Visit | Attending: Family Medicine | Admitting: Family Medicine

## 2014-09-18 ENCOUNTER — Other Ambulatory Visit (HOSPITAL_COMMUNITY): Payer: Self-pay | Admitting: Family Medicine

## 2014-09-18 DIAGNOSIS — G8929 Other chronic pain: Secondary | ICD-10-CM

## 2014-09-18 DIAGNOSIS — M25461 Effusion, right knee: Secondary | ICD-10-CM | POA: Insufficient documentation

## 2014-09-18 DIAGNOSIS — M25561 Pain in right knee: Secondary | ICD-10-CM | POA: Diagnosis present

## 2014-09-23 ENCOUNTER — Other Ambulatory Visit (HOSPITAL_COMMUNITY): Payer: Self-pay | Admitting: Family Medicine

## 2014-09-23 DIAGNOSIS — G8929 Other chronic pain: Secondary | ICD-10-CM

## 2014-09-27 ENCOUNTER — Ambulatory Visit (HOSPITAL_COMMUNITY)
Admission: RE | Admit: 2014-09-27 | Discharge: 2014-09-27 | Disposition: A | Discharge status: Home / Self Care | Payer: BC Managed Care – PPO | Source: Ambulatory Visit | Attending: Family Medicine | Admitting: Family Medicine

## 2014-09-27 ENCOUNTER — Encounter (HOSPITAL_COMMUNITY): Payer: Self-pay

## 2014-09-27 DIAGNOSIS — M7121 Synovial cyst of popliteal space [Baker], right knee: Secondary | ICD-10-CM | POA: Diagnosis not present

## 2014-09-27 DIAGNOSIS — M25461 Effusion, right knee: Secondary | ICD-10-CM | POA: Insufficient documentation

## 2014-09-27 DIAGNOSIS — M25561 Pain in right knee: Secondary | ICD-10-CM | POA: Insufficient documentation

## 2014-09-27 DIAGNOSIS — G8929 Other chronic pain: Secondary | ICD-10-CM

## 2014-09-27 DIAGNOSIS — M94261 Chondromalacia, right knee: Secondary | ICD-10-CM | POA: Insufficient documentation

## 2014-10-22 ENCOUNTER — Encounter: Payer: Self-pay | Admitting: Orthopedic Surgery

## 2014-10-22 ENCOUNTER — Ambulatory Visit (INDEPENDENT_AMBULATORY_CARE_PROVIDER_SITE_OTHER): Payer: BC Managed Care – PPO | Admitting: Orthopedic Surgery

## 2014-10-22 VITALS — BP 153/84 | Ht 65.0 in | Wt 180.0 lb

## 2014-10-22 DIAGNOSIS — M1711 Unilateral primary osteoarthritis, right knee: Secondary | ICD-10-CM | POA: Insufficient documentation

## 2014-10-22 NOTE — Progress Notes (Signed)
Patient ID: [name], male   DOB: [birth year], 53 y.o.   MRN: [id number] Chief Complaint  Patient presents with  . Knee Pain    Right knee pain s/p fall 2 + months ago, referred by [facility]   Patient ID: [name], male   DOB: May 17, 1961, 53 y.o.   MRN: [id number]  Chief Complaint  Patient presents with  . Knee Pain    Right knee pain s/p fall 2 + months ago, referred by [facility]    HPI [name] is a 53 y.o. male.53 year old male had remote trauma to his knee falling off of a roof every years ago no significant surgical treatment required. [place] off of a ladder couple months ago complains of pain swelling despite being on Modicon Ultracet. His pain is 2 out of 10 occasionally reaching 8 out of 10 never reaches 10 out of 10 no instability or mechanical symptoms noted. He had an MRI of his knee and it showed basically chondromalacia medial femoral condyle effusion with Baker's cyst some edema in the soft tissues linear fluid signal in the posterior superior periphery of the posterior horn of the medial meniscus without advancing to the level of significant tear. Plain films are noted as well and a small effusion was noted no evidence of significant joint space loss but mild tibial spine spurring and minimal patella spurring indicating arthritis.    HPI  Past Medical History  Diagnosis Date  . GERD (gastroesophageal reflux disease)     Past Surgical History  Procedure Laterality Date  . Wrist surgery Right     otif  . Hand surgery Right     contracture of hand  . Inguinal hernia repair Right 11/23/2013    Procedure: HERNIA REPAIR INGUINAL ADULT;  Surgeon: Scherry Ran, MD;  Location: [facility];  Service: General;  Laterality: Right;  site-inguinal area    No family history on file.  Social History History  Substance Use Topics  . Smoking status: Never Smoker   . Smokeless tobacco: Not on file  . Alcohol Use: Yes     Comment: occ    Allergies  Allergen  Reactions  . Ibuprofen Swelling  . Morphine And Related Swelling  . Bee Venom Rash    Current Outpatient Prescriptions  Medication Sig Dispense Refill  . esomeprazole (NEXIUM) 40 MG capsule Take 40 mg by mouth every morning.     Marland Kitchen aspirin 81 MG chewable tablet Chew 1 tablet (81 mg total) by mouth once. 30 tablet 0  . LORazepam (ATIVAN) 1 MG tablet Take 1 tablet (1 mg total) by mouth 3 (three) times daily as needed (nausea / vomiting / dizziness). 15 tablet 0   No current facility-administered medications for this visit.    Review of Systems Review of Systems Review of systems he reports some night sweats ringing in his ears hearing loss vision problems, sinus problems numbness tingling burning pain in his legs dizziness joint pain limb pain back pain otherwise all systems are reviewed were negative   Blood pressure 153/84, height 5\' 5"  (1.651 m), weight 180 lb (81.647 kg).  Physical Exam Physical Exam He is well-developed nourished grooming and hygiene are normal is oriented 3 his mood and affect is normal his gait and station are normal. He has no palpable tenderness on either side of the joint range of motion full ligament stable motor exam normal skin intact McMurray sign negative normal distal pulses normal sensation in the right lower extremity  Left  lower extremity normal as well Data Reviewed Independent interpretation of his knee x-ray shows mild insignificant degenerative changes. MRI interpretation no meniscal tear old anterior cruciate ligament degenerative type signal without tear  Assessment    Encounter Diagnosis  Name Primary?  . Primary osteoarthritis of right knee Yes        Plan    I recommend we continue Modic and inject his knee.  Procedure note right knee injection verbal consent was obtained to inject right knee joint  Timeout was completed to confirm the site of injection  The medications used were 40 mg of Depo-Medrol and 1% lidocaine 3  cc  Anesthesia was provided by ethyl chloride and the skin was prepped with alcohol.  After cleaning the skin with alcohol a 20-gauge needle was used to inject the right knee joint. There were no complications. A sterile bandage was applied.          Arther Abbott 10/22/2014, 9:47 AM

## 2014-10-29 ENCOUNTER — Encounter (HOSPITAL_COMMUNITY): Payer: Self-pay | Admitting: Emergency Medicine

## 2014-10-29 ENCOUNTER — Emergency Department (HOSPITAL_COMMUNITY): Payer: BC Managed Care – PPO

## 2014-10-29 ENCOUNTER — Inpatient Hospital Stay (HOSPITAL_COMMUNITY)
Admission: EM | Admit: 2014-10-29 | Discharge: 2014-10-31 | DRG: 390 | Disposition: A | Discharge status: Home / Self Care | Payer: BC Managed Care – PPO | Attending: Family Medicine | Admitting: Family Medicine

## 2014-10-29 DIAGNOSIS — Z79899 Other long term (current) drug therapy: Secondary | ICD-10-CM | POA: Diagnosis not present

## 2014-10-29 DIAGNOSIS — K5669 Other intestinal obstruction: Secondary | ICD-10-CM

## 2014-10-29 DIAGNOSIS — K219 Gastro-esophageal reflux disease without esophagitis: Secondary | ICD-10-CM

## 2014-10-29 DIAGNOSIS — M549 Dorsalgia, unspecified: Secondary | ICD-10-CM

## 2014-10-29 DIAGNOSIS — K5660 Unspecified intestinal obstruction: Secondary | ICD-10-CM | POA: Diagnosis present

## 2014-10-29 DIAGNOSIS — K56609 Unspecified intestinal obstruction, unspecified as to partial versus complete obstruction: Secondary | ICD-10-CM | POA: Diagnosis present

## 2014-10-29 DIAGNOSIS — M545 Low back pain: Secondary | ICD-10-CM | POA: Diagnosis present

## 2014-10-29 LAB — COMPREHENSIVE METABOLIC PANEL
ALT: 39 U/L (ref 0–53)
AST: 28 U/L (ref 0–37)
Albumin: 4.7 g/dL (ref 3.5–5.2)
Alkaline Phosphatase: 93 U/L (ref 39–117)
Anion gap: 15 (ref 5–15)
BUN: 11 mg/dL (ref 6–23)
CALCIUM: 9.9 mg/dL (ref 8.4–10.5)
CHLORIDE: 100 meq/L (ref 96–112)
CO2: 27 mEq/L (ref 19–32)
Creatinine, Ser: 0.84 mg/dL (ref 0.50–1.35)
GFR calc Af Amer: >90 mL/min (ref >90)
GFR calc non Af Amer: >90 mL/min (ref >90)
GLUCOSE: 104 mg/dL — AB (ref 70–99)
POTASSIUM: 3.7 meq/L (ref 3.7–5.3)
SODIUM: 142 meq/L (ref 137–147)
Total Bilirubin: 1.1 mg/dL (ref 0.3–1.2)
Total Protein: 8.2 g/dL (ref 6.0–8.3)

## 2014-10-29 LAB — URINALYSIS, ROUTINE W REFLEX MICROSCOPIC
GLUCOSE, UA: NEGATIVE mg/dL
Hgb urine dipstick: NEGATIVE
Leukocytes, UA: NEGATIVE
Nitrite: NEGATIVE
PROTEIN: 30 mg/dL — AB
Specific Gravity, Urine: 1.02 (ref 1.005–1.030)
Urobilinogen, UA: 1 mg/dL (ref 0.0–1.0)
pH: 7.5 (ref 5.0–8.0)

## 2014-10-29 LAB — CBC WITH DIFFERENTIAL/PLATELET
BASOS ABS: 0 10*3/uL (ref 0.0–0.1)
Basophils Relative: 0 % (ref 0–1)
EOS PCT: 0 % (ref 0–5)
Eosinophils Absolute: 0 10*3/uL (ref 0.0–0.7)
HCT: 52 % (ref 39.0–52.0)
Hemoglobin: 18.5 g/dL — ABNORMAL HIGH (ref 13.0–17.0)
LYMPHS PCT: 11 % — AB (ref 12–46)
Lymphs Abs: 1.3 10*3/uL (ref 0.7–4.0)
MCH: 33.9 pg (ref 26.0–34.0)
MCHC: 35.6 g/dL (ref 30.0–36.0)
MCV: 95.2 fL (ref 78.0–100.0)
Monocytes Absolute: 0.9 10*3/uL (ref 0.1–1.0)
Monocytes Relative: 7 % (ref 3–12)
NEUTROS ABS: 10.5 10*3/uL — AB (ref 1.7–7.7)
NEUTROS PCT: 82 % — AB (ref 43–77)
Platelets: 255 10*3/uL (ref 150–400)
RBC: 5.46 MIL/uL (ref 4.22–5.81)
RDW: 12.5 % (ref 11.5–15.5)
WBC: 12.7 10*3/uL — AB (ref 4.0–10.5)

## 2014-10-29 LAB — URINE MICROSCOPIC-ADD ON

## 2014-10-29 LAB — LACTIC ACID, PLASMA: LACTIC ACID, VENOUS: 1.2 mmol/L (ref 0.5–2.2)

## 2014-10-29 LAB — LIPASE, BLOOD: LIPASE: 15 U/L (ref 11–59)

## 2014-10-29 MED ORDER — HYDROMORPHONE HCL 1 MG/ML IJ SOLN: 1.0000 mg | INTRAMUSCULAR | Status: DC | PRN

## 2014-10-29 MED ORDER — ONDANSETRON HCL 4 MG/2ML IJ SOLN
4.0000 mg | Freq: Four times a day (QID) | INTRAMUSCULAR | Status: DC | PRN
  Administered 2014-10-29: 4 mg via INTRAVENOUS
  Filled 2014-10-29: qty 2

## 2014-10-29 MED ORDER — ONDANSETRON 8 MG PO TBDP
8.0000 mg | ORAL_TABLET | Freq: Once | ORAL | Status: AC
  Administered 2014-10-29: 8 mg via ORAL
  Filled 2014-10-29: qty 1

## 2014-10-29 MED ORDER — PANTOPRAZOLE SODIUM 40 MG IV SOLR
80.0000 mg | Freq: Every day | INTRAVENOUS | Status: DC
  Administered 2014-10-30 (×2): 80 mg via INTRAVENOUS
  Filled 2014-10-29 (×3): qty 80

## 2014-10-29 MED ORDER — ONDANSETRON HCL 4 MG/2ML IJ SOLN: 4.0000 mg | Freq: Three times a day (TID) | INTRAMUSCULAR | Status: DC | PRN

## 2014-10-29 MED ORDER — HYDROMORPHONE HCL 1 MG/ML IJ SOLN
0.5000 mg | INTRAMUSCULAR | Status: DC | PRN
  Administered 2014-10-29 – 2014-10-30 (×3): 0.5 mg via INTRAVENOUS
  Filled 2014-10-29 (×3): qty 1

## 2014-10-29 MED ORDER — SODIUM CHLORIDE 0.45 % IV SOLN
INTRAVENOUS | Status: DC
  Administered 2014-10-29 – 2014-10-30 (×2): via INTRAVENOUS

## 2014-10-29 MED ORDER — ONDANSETRON HCL 4 MG PO TABS: 4.0000 mg | ORAL_TABLET | Freq: Four times a day (QID) | ORAL | Status: DC | PRN

## 2014-10-29 MED ORDER — HEPARIN SODIUM (PORCINE) 5000 UNIT/ML IJ SOLN
5000.0000 [IU] | Freq: Three times a day (TID) | INTRAMUSCULAR | Status: DC
  Administered 2014-10-29 – 2014-10-31 (×6): 5000 [IU] via SUBCUTANEOUS
  Filled 2014-10-29 (×6): qty 1

## 2014-10-29 MED ORDER — HYDROMORPHONE HCL 1 MG/ML IJ SOLN
1.0000 mg | Freq: Once | INTRAMUSCULAR | Status: AC
  Administered 2014-10-29: 1 mg via INTRAMUSCULAR
  Filled 2014-10-29: qty 1

## 2014-10-29 MED ORDER — SENNA 8.6 MG PO TABS
1.0000 | ORAL_TABLET | Freq: Two times a day (BID) | ORAL | Status: DC
  Administered 2014-10-29 – 2014-10-31 (×4): 8.6 mg via ORAL
  Filled 2014-10-29 (×4): qty 1

## 2014-10-29 NOTE — ED Notes (Signed)
Pt c/o lower back pain x 2 weeks. Pt denies any injury.

## 2014-10-29 NOTE — ED Provider Notes (Signed)
CSN: [id number]     Arrival date & time 10/29/14  1858 History   First MD Initiated Contact with Patient 10/29/14 1934     Chief Complaint  Patient presents with  . Back Pain     (Consider location/radiation/quality/duration/timing/severity/associated sxs/prior Treatment) The history is provided by the patient and the spouse.   [name] is a 53 y.o. male presenting with a 2 week history of left lower back pain which has been intermittent, sometimes sharp, sometimes aching pain along with increased urinary frequency but no hematuria, dysuria or urgency.  He reports a history of kidney stones, last occuring 2 years ago and was passed spontaneously.  Today he has also gradually developed upper abdominal pain along with 2 episodes of vomiting.  He denies fevers or chills, diarrhea or constipation, stating his last bm was last night and normal.  He has had no medicines for his symptoms prior to arrival and has found no alleviators.  His back pain is worsened with movement and palpation.  He denies injury, but does have occasional low back pain.  He denies weakness, dizziness, chest pain and shortness of breath.  Past medical history is significant for GERD and he had an inguinal hernia repair last year.   Past Medical History  Diagnosis Date  . GERD (gastroesophageal reflux disease)    Past Surgical History  Procedure Laterality Date  . Wrist surgery Right     otif  . Hand surgery Right     contracture of hand  . Inguinal hernia repair Right 11/23/2013    Procedure: HERNIA REPAIR INGUINAL ADULT;  Surgeon: Scherry Ran, MD;  Location: [facility];  Service: General;  Laterality: Right;  site-inguinal area   History reviewed. No pertinent family history. History  Substance Use Topics  . Smoking status: Never Smoker   . Smokeless tobacco: Not on file  . Alcohol Use: Yes     Comment: occ    Review of Systems  Constitutional: Negative for fever and chills.  HENT: Negative for  congestion and sore throat.   Eyes: Negative.   Respiratory: Negative for chest tightness and shortness of breath.   Cardiovascular: Negative for chest pain.  Gastrointestinal: Positive for nausea, vomiting and abdominal pain. Negative for diarrhea and constipation.  Genitourinary: Positive for frequency. Negative for dysuria and urgency.  Musculoskeletal: Positive for back pain. Negative for joint swelling, arthralgias and neck pain.  Skin: Negative.  Negative for rash and wound.  Neurological: Negative for dizziness, weakness, light-headedness, numbness and headaches.  Psychiatric/Behavioral: Negative.       Allergies  Ibuprofen; Morphine and related; and Bee venom  Home Medications   Prior to Admission medications   Medication Sig Start Date End Date Taking? Authorizing Provider  esomeprazole (NEXIUM) 40 MG capsule Take 40 mg by mouth every morning.    Yes Historical Provider, MD  aspirin 81 MG chewable tablet Chew 1 tablet (81 mg total) by mouth once. Patient not taking: Reported on 10/29/2014 06/06/14   Hoy Morn, MD  LORazepam (ATIVAN) 1 MG tablet Take 1 tablet (1 mg total) by mouth 3 (three) times daily as needed (nausea / vomiting / dizziness). Patient not taking: Reported on 10/29/2014 06/02/14   Carmin Muskrat, MD   BP 139/97 mmHg  Pulse 87  Temp(Src) 99 F (37.2 C) (Oral)  Resp 18  Ht 5\' 5"  (1.651 m)  Wt 179 lb 7.3 oz (81.4 kg)  BMI 29.86 kg/m2  SpO2 95% Physical Exam  Constitutional: He  appears well-developed and well-nourished.  HENT:  Head: Normocephalic and atraumatic.  Eyes: Conjunctivae are normal.  Neck: Normal range of motion.  Cardiovascular: Normal rate, regular rhythm, normal heart sounds and intact distal pulses.   Pulmonary/Chest: Effort normal and breath sounds normal. He has no wheezes. He exhibits no tenderness.  Abdominal: Soft. Bowel sounds are normal. He exhibits distension. There is tenderness. There is CVA tenderness. There is no rebound  and no guarding.  Increased tympany to percussion.  BS normal.  Left cva ttp.  Musculoskeletal: Normal range of motion.  Neurological: He is alert.  Skin: Skin is warm and dry.  Psychiatric: He has a normal mood and affect.  Nursing note and vitals reviewed.   ED Course  Procedures (including critical care time) Labs Review Labs Reviewed  CBC WITH DIFFERENTIAL - Abnormal; Notable for the following:    WBC 12.7 (*)    Hemoglobin 18.5 (*)    Neutrophils Relative % 82 (*)    Neutro Abs 10.5 (*)    Lymphocytes Relative 11 (*)    All other components within normal limits  COMPREHENSIVE METABOLIC PANEL - Abnormal; Notable for the following:    Glucose, Bld 104 (*)    All other components within normal limits  URINALYSIS, ROUTINE W REFLEX MICROSCOPIC - Abnormal; Notable for the following:    Bilirubin Urine SMALL (*)    Ketones, ur TRACE (*)    Protein, ur 30 (*)    All other components within normal limits  URINE MICROSCOPIC-ADD ON - Abnormal; Notable for the following:    Squamous Epithelial / LPF FEW (*)    All other components within normal limits  LACTIC ACID, PLASMA  LIPASE, BLOOD  COMPREHENSIVE METABOLIC PANEL    Imaging Review Ct Abdomen Pelvis Wo Contrast  10/29/2014   CLINICAL DATA:  Low back pain for 2 weeks, history of renal stones  EXAM: CT ABDOMEN AND PELVIS WITHOUT CONTRAST  TECHNIQUE: Multidetector CT imaging of the abdomen and pelvis was performed following the standard protocol without IV contrast.  COMPARISON:  None.  FINDINGS: Study is markedly limited without IV and oral contrast. Small hiatal hernia is noted. Sagittal images of the spine shows degenerative changes lower thoracic and upper lumbar spine. Stable chronic mild compression deformity of L1 vertebral body.  There is significant gastric distension with fluid suspicious for ileus or gastroparesis. There is no evidence of gastric outlet obstruction.  Lung bases are unremarkable.  No calcified gallstones are  noted within gallbladder. Unenhanced liver, pancreas, spleen and adrenal glands are unremarkable. Unenhanced kidneys are symmetrical in size. No nephrolithiasis. No aortic aneurysm. No hydronephrosis or hydroureter. No calcified ureteral calculi are noted.  There is a low lying cecum. Abundant stool noted within cecum. No pericecal inflammation. Normal appendix is noted in mid upper posterior pelvis.  There are abnormal distended small bowel loops in left abdomen and left lower quadrant. Fecal like material noted in small bowel loops in left abdomen axial image 47 and 48. In axial image 37 there is transition point in caliber of small bowel in left lower quadrant. Findings are highly suspicious for partial small bowel obstruction. Less likely ileus or enteritis.  There is mild stranding of the mesenteric fat adjacent to left abdominal distended small bowel loops see coronal image 38. Findings suspicious for mild adjacent mesenteric inflammation or edema.  The distal small bowel is small caliber. There is a left inguinal scrotal canal hernia containing fat measures 3.6 cm without evidence of acute complication.  The transverse colon left colon and proximal sigmoid colon are empty collapsed. Few colonic diverticula are noted in sigmoid colon. No evidence of acute diverticulitis.  Prostate gland and seminal vesicles are unremarkable. No calcified calculi are noted within urinary bladder.  IMPRESSION: 1. There is abnormal segmental dilatation of small bowel in left lower abdomen with fecal like material and some air-fluid levels. Subtle mild stranding of surrounding mesenteric fat suspicious for mesenteric edema or inflammation. There is transition in small bowel caliber with distal small bowel being small caliber. Findings are highly suspicious for small bowel obstruction. Less likely ileus or enteritis 2. There is a low lying cecum with tip in mid pelvis just above the urinary bladder. Abundant stool noted within  cecum. Normal appendix. No pericecal inflammation. 3. Gastric distension with fluid suspicious for gastroparesis or gastric ileus. No evidence of gastric outlet obstruction. 4. No nephrolithiasis. No hydronephrosis or hydroureter. No calcified ureteral calculi. 5. No urinary bladder calcified calculi. 6. Left inguinal scrotal canal hernia containing fat measures 3.6 cm without evidence of acute complication.   Electronically Signed   By: Lahoma Crocker M.D.   On: 10/29/2014 21:13     EKG Interpretation None      MDM   Final diagnoses:  Back pain  Small bowel obstruction    Patients labs and/or radiological studies were viewed and considered during the medical decision making and disposition process.  Discussed findings with patient and wife.  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  He states this resulted in a sbo.   Pt has had no vomiting since arrival here.  Doubt need for ng tube at this time.  Discussed case with Dr Arnoldo Morale who will see pt in am.  In the interim,  Npo except for ice chips.  Will call hospitalists for admission.    Evalee Jefferson, PA-C 10/30/14 [place], MD 10/30/14 2033

## 2014-10-29 NOTE — H&P (Signed)
[facility] History and Physical  [name] ATF:[id number] DOB: 1961-08-21 DOA: 10/29/2014  Referring physician: Evalee Jefferson PA - [facility] PCP: Lanette Hampshire, MD   Chief Complaint: back pain:   HPI: [name] is a 53 y.o. male  Developed L lower back pain 2 wks ago. Intermittent. Sharp and achy in nature. Associated w/ frequency but no dysuria or urgency. H/o kidney stones. Today, pain now becoming more epigastric in location w/ 2 episodes of nausea adn non-bloody emesis.  meds  Inguinal hernia repair last year.  H/o SBO x2 ni the past    Review of Systems:  Constitutional: No weight loss, night sweats, Fevers, chills, fatigue.  HEENT:  No headaches, Difficulty swallowing,Tooth/dental problems,Sore throat,  No sneezing, itching, ear ache, nasal congestion, post nasal drip,  Cardio-vascular:  No chest pain, Orthopnea, PND, swelling in lower extremities, anasarca, dizziness, palpitations  GI:  Per hpi Resp:  No shortness of breath with exertion or at rest. No excess mucus, no productive cough, No non-productive cough, No coughing up of blood.No change in color of mucus.No wheezing.No chest wall deformity  Skin:  no rash or lesions.  GU:  Per hpi Musculoskeletal:  No joint pain or swelling. No decreased range of motion. No back pain.  Psych:  No change in mood or affect. No depression or anxiety. No memory loss.   Past Medical History  Diagnosis Date  . GERD (gastroesophageal reflux disease)    Past Surgical History  Procedure Laterality Date  . Wrist surgery Right     otif  . Hand surgery Right     contracture of hand  . Inguinal hernia repair Right 11/23/2013    Procedure: HERNIA REPAIR INGUINAL ADULT;  Surgeon: Scherry Ran, MD;  Location: [facility];  Service: General;  Laterality: Right;  site-inguinal area   Social History:  reports that he has never smoked. He does not have any smokeless tobacco history on file. He reports that he drinks  alcohol. He reports that he does not use illicit drugs.  Allergies  Allergen Reactions  . Ibuprofen Swelling  . Morphine And Related Swelling  . Bee Venom Rash    History reviewed. No pertinent family history.   Prior to Admission medications   Medication Sig Start Date End Date Taking? Authorizing Provider  esomeprazole (NEXIUM) 40 MG capsule Take 40 mg by mouth every morning.    Yes Historical Provider, MD  aspirin 81 MG chewable tablet Chew 1 tablet (81 mg total) by mouth once. Patient not taking: Reported on 10/29/2014 06/06/14   Hoy Morn, MD  LORazepam (ATIVAN) 1 MG tablet Take 1 tablet (1 mg total) by mouth 3 (three) times daily as needed (nausea / vomiting / dizziness). Patient not taking: Reported on 10/29/2014 06/02/14   Carmin Muskrat, MD   Physical Exam: Filed Vitals:   10/29/14 1920 10/29/14 2254 10/29/14 2307  BP: 129/95 139/97   Pulse: 92 87   Temp: 98 F (36.7 C) 99 F (37.2 C)   TempSrc: Oral Oral Oral  Resp: 18 18   Height: 5\' 5"  (1.651 m) 5\' 5"  (1.651 m)   Weight: 81.647 kg (180 lb) 81.4 kg (179 lb 7.3 oz)   SpO2: 96% 95%     Wt Readings from Last 3 Encounters:  10/29/14 81.4 kg (179 lb 7.3 oz)  10/22/14 81.647 kg (180 lb)  06/05/14 81.647 kg (180 lb)    General: Appears calm and comfortable Eyes:  PERRL, normal lids, irises & conjunctiva  ENT:  grossly normal hearing, lips & tongue Neck:  no LAD, masses or thyromegaly Cardiovascular:  RRR, no m/r/g. No LE edema. Telemetry:  SR, no arrhythmias  Respiratory:  CTA bilaterally, no w/r/r. Normal respiratory effort. Abdomen: hypoactive BS, soft, ntnd Skin:  no rash or induration seen on limited exam Musculoskeletal:  grossly normal tone BUE/BLE Psychiatric:  grossly normal mood and affect, speech fluent and appropriate Neurologic:  grossly non-focal.          Labs on Admission:  Basic Metabolic Panel:  Recent Labs Lab 10/29/14 2023  NA 142  K 3.7  CL 100  CO2 27  GLUCOSE 104*  BUN 11    CREATININE 0.84  CALCIUM 9.9   Liver Function Tests:  Recent Labs Lab 10/29/14 2023  AST 28  ALT 39  ALKPHOS 93  BILITOT 1.1  PROT 8.2  ALBUMIN 4.7    Recent Labs Lab 10/29/14 2204  LIPASE 15   No results for input(s): AMMONIA in the last 168 hours. CBC:  Recent Labs Lab 10/29/14 2023  WBC 12.7*  NEUTROABS 10.5*  HGB 18.5*  HCT 52.0  MCV 95.2  PLT 255   Cardiac Enzymes: No results for input(s): CKTOTAL, CKMB, CKMBINDEX, TROPONINI in the last 168 hours.  BNP (last 3 results) No results for input(s): PROBNP in the last 8760 hours. CBG: No results for input(s): GLUCAP in the last 168 hours.  Radiological Exams on Admission: Ct Abdomen Pelvis Wo Contrast  10/29/2014   CLINICAL DATA:  Low back pain for 2 weeks, history of renal stones  EXAM: CT ABDOMEN AND PELVIS WITHOUT CONTRAST  TECHNIQUE: Multidetector CT imaging of the abdomen and pelvis was performed following the standard protocol without IV contrast.  COMPARISON:  None.  FINDINGS: Study is markedly limited without IV and oral contrast. Small hiatal hernia is noted. Sagittal images of the spine shows degenerative changes lower thoracic and upper lumbar spine. Stable chronic mild compression deformity of L1 vertebral body.  There is significant gastric distension with fluid suspicious for ileus or gastroparesis. There is no evidence of gastric outlet obstruction.  Lung bases are unremarkable.  No calcified gallstones are noted within gallbladder. Unenhanced liver, pancreas, spleen and adrenal glands are unremarkable. Unenhanced kidneys are symmetrical in size. No nephrolithiasis. No aortic aneurysm. No hydronephrosis or hydroureter. No calcified ureteral calculi are noted.  There is a low lying cecum. Abundant stool noted within cecum. No pericecal inflammation. Normal appendix is noted in mid upper posterior pelvis.  There are abnormal distended small bowel loops in left abdomen and left lower quadrant. Fecal like  material noted in small bowel loops in left abdomen axial image 47 and 48. In axial image 37 there is transition point in caliber of small bowel in left lower quadrant. Findings are highly suspicious for partial small bowel obstruction. Less likely ileus or enteritis.  There is mild stranding of the mesenteric fat adjacent to left abdominal distended small bowel loops see coronal image 38. Findings suspicious for mild adjacent mesenteric inflammation or edema.  The distal small bowel is small caliber. There is a left inguinal scrotal canal hernia containing fat measures 3.6 cm without evidence of acute complication.  The transverse colon left colon and proximal sigmoid colon are empty collapsed. Few colonic diverticula are noted in sigmoid colon. No evidence of acute diverticulitis.  Prostate gland and seminal vesicles are unremarkable. No calcified calculi are noted within urinary bladder.  IMPRESSION: 1. There is abnormal segmental dilatation of small bowel in  left lower abdomen with fecal like material and some air-fluid levels. Subtle mild stranding of surrounding mesenteric fat suspicious for mesenteric edema or inflammation. There is transition in small bowel caliber with distal small bowel being small caliber. Findings are highly suspicious for small bowel obstruction. Less likely ileus or enteritis 2. There is a low lying cecum with tip in mid pelvis just above the urinary bladder. Abundant stool noted within cecum. Normal appendix. No pericecal inflammation. 3. Gastric distension with fluid suspicious for gastroparesis or gastric ileus. No evidence of gastric outlet obstruction. 4. No nephrolithiasis. No hydronephrosis or hydroureter. No calcified ureteral calculi. 5. No urinary bladder calcified calculi. 6. Left inguinal scrotal canal hernia containing fat measures 3.6 cm without evidence of acute complication.   Electronically Signed   By: Lahoma Crocker M.D.   On: 10/29/2014 21:13    EKG: Independently  reviewed. Not performed  Assessment/Plan Principal Problem:   Small bowel obstruction Active Problems:   Esophageal reflux  Small bowel obstruction: mild. 2 previous episodes. Per pt previous episodes started by food poisoning. Abd surgery less than 1 year ago. Pt refusing NG tube at this time. Informed that if emesi continues, will need to have the tube placed. ED consulted Dr. Arnoldo Morale who wants pt admitted. WBC >12 likely stress related from SBO vs early infection. - Admit obs - Dr Arnoldo Morale to see in the am - Insert NG tube if develops further emesis - 1/2NS 144ml/hr - Zofran  GERD:  Continue home PPI  Code Status: FULL DVT Prophylaxis: Hep Family Communication: none Disposition Plan: pending evaluation by Dr. Arnoldo Morale and clinical improvement   Time spent: >50 min in direct pt care and coordination  [place] [place] [facility] [email or web address] Password [email or web address]

## 2014-10-30 LAB — COMPREHENSIVE METABOLIC PANEL
ALBUMIN: 3.9 g/dL (ref 3.5–5.2)
ALT: 30 U/L (ref 0–53)
ANION GAP: 12 (ref 5–15)
AST: 20 U/L (ref 0–37)
Alkaline Phosphatase: 78 U/L (ref 39–117)
BUN: 16 mg/dL (ref 6–23)
CALCIUM: 9 mg/dL (ref 8.4–10.5)
CO2: 28 mEq/L (ref 19–32)
CREATININE: 0.98 mg/dL (ref 0.50–1.35)
Chloride: 100 mEq/L (ref 96–112)
GFR calc non Af Amer: >90 mL/min (ref >90)
Glucose, Bld: 172 mg/dL — ABNORMAL HIGH (ref 70–99)
Potassium: 3.8 mEq/L (ref 3.7–5.3)
Sodium: 140 mEq/L (ref 137–147)
TOTAL PROTEIN: 7.1 g/dL (ref 6.0–8.3)
Total Bilirubin: 1 mg/dL (ref 0.3–1.2)

## 2014-10-30 MED ORDER — PANTOPRAZOLE SODIUM 40 MG IV SOLR
INTRAVENOUS | Status: AC
  Filled 2014-10-30: qty 80

## 2014-10-30 MED ORDER — MILK AND MOLASSES ENEMA
1.0000 | Freq: Once | RECTAL | Status: AC
  Administered 2014-10-30: 250 mL via RECTAL

## 2014-10-30 MED ORDER — KCL IN DEXTROSE-NACL 20-5-0.45 MEQ/L-%-% IV SOLN
INTRAVENOUS | Status: DC
  Administered 2014-10-30 – 2014-10-31 (×2): via INTRAVENOUS

## 2014-10-30 MED ORDER — PROMETHAZINE HCL 25 MG/ML IJ SOLN
12.5000 mg | Freq: Four times a day (QID) | INTRAMUSCULAR | Status: DC | PRN
Start: 2014-10-30 — End: 2014-10-31
  Administered 2014-10-30: 12.5 mg via INTRAVENOUS
  Filled 2014-10-30: qty 1

## 2014-10-30 NOTE — Plan of Care (Signed)
Problem: Phase I Progression Outcomes Goal: Pain controlled with appropriate interventions Outcome: Completed/Met Date Met:  10/30/14     

## 2014-10-30 NOTE — Progress Notes (Signed)
UR chart review completed.  

## 2014-10-30 NOTE — Progress Notes (Signed)
[facility] PROGRESS NOTE  [name] YCX:[id number] DOB: December 14, 1960 DOA: 10/29/2014 PCP: Lanette Hampshire, MD  Assessment/Plan:  Principal Problem:   Small bowel obstruction - Gen. surgery on board - Continue nothing by mouth status - Place patient on maintenance IV fluids  Active Problems:    Esophageal reflux - Pt to continue Protonix, stable  Code Status: Full Family Communication: Discussed with patient and spouse at bedside  Disposition Plan: With improvement in condition   Consultants:  General surgery  Procedures:  None  Antibiotics:  None  HPI/Subjective: Pt has no new complaints.  Objective: Filed Vitals:   10/30/14 0610  BP:   Pulse: 83  Temp: 98.5 F (36.9 C)  Resp: 18    Intake/Output Summary (Last 24 hours) at 10/30/14 1433 Last data filed at 10/30/14 1200  Gross per 24 hour  Intake 823.33 ml  Output      0 ml  Net 823.33 ml   Filed Weights   10/29/14 1920 10/29/14 2254  Weight: 81.647 kg (180 lb) 81.4 kg (179 lb 7.3 oz)    Exam:   General:  Pt in nad, alert awake  Cardiovascular: rrr, no mrg  Respiratory: cta bl, no wheezes  Abdomen: soft, NT, no guarding  Musculoskeletal: no cyanosis   Data Reviewed: Basic Metabolic Panel:  Recent Labs Lab 10/29/14 2023 10/30/14 0535  NA 142 140  K 3.7 3.8  CL 100 100  CO2 27 28  GLUCOSE 104* 172*  BUN 11 16  CREATININE 0.84 0.98  CALCIUM 9.9 9.0   Liver Function Tests:  Recent Labs Lab 10/29/14 2023 10/30/14 0535  AST 28 20  ALT 39 30  ALKPHOS 93 78  BILITOT 1.1 1.0  PROT 8.2 7.1  ALBUMIN 4.7 3.9    Recent Labs Lab 10/29/14 2204  LIPASE 15   No results for input(s): AMMONIA in the last 168 hours. CBC:  Recent Labs Lab 10/29/14 2023  WBC 12.7*  NEUTROABS 10.5*  HGB 18.5*  HCT 52.0  MCV 95.2  PLT 255   Cardiac Enzymes: No results for input(s): CKTOTAL, CKMB, CKMBINDEX, TROPONINI in the last 168 hours. BNP (last 3 results) No results for  input(s): PROBNP in the last 8760 hours. CBG: No results for input(s): GLUCAP in the last 168 hours.  No results found for this or any previous visit (from the past 240 hour(s)).   Studies: Ct Abdomen Pelvis Wo Contrast  10/29/2014   CLINICAL DATA:  Low back pain for 2 weeks, history of renal stones  EXAM: CT ABDOMEN AND PELVIS WITHOUT CONTRAST  TECHNIQUE: Multidetector CT imaging of the abdomen and pelvis was performed following the standard protocol without IV contrast.  COMPARISON:  None.  FINDINGS: Study is markedly limited without IV and oral contrast. Small hiatal hernia is noted. Sagittal images of the spine shows degenerative changes lower thoracic and upper lumbar spine. Stable chronic mild compression deformity of L1 vertebral body.  There is significant gastric distension with fluid suspicious for ileus or gastroparesis. There is no evidence of gastric outlet obstruction.  Lung bases are unremarkable.  No calcified gallstones are noted within gallbladder. Unenhanced liver, pancreas, spleen and adrenal glands are unremarkable. Unenhanced kidneys are symmetrical in size. No nephrolithiasis. No aortic aneurysm. No hydronephrosis or hydroureter. No calcified ureteral calculi are noted.  There is a low lying cecum. Abundant stool noted within cecum. No pericecal inflammation. Normal appendix is noted in mid upper posterior pelvis.  There are abnormal distended small bowel loops in  left abdomen and left lower quadrant. Fecal like material noted in small bowel loops in left abdomen axial image 47 and 48. In axial image 37 there is transition point in caliber of small bowel in left lower quadrant. Findings are highly suspicious for partial small bowel obstruction. Less likely ileus or enteritis.  There is mild stranding of the mesenteric fat adjacent to left abdominal distended small bowel loops see coronal image 38. Findings suspicious for mild adjacent mesenteric inflammation or edema.  The distal  small bowel is small caliber. There is a left inguinal scrotal canal hernia containing fat measures 3.6 cm without evidence of acute complication.  The transverse colon left colon and proximal sigmoid colon are empty collapsed. Few colonic diverticula are noted in sigmoid colon. No evidence of acute diverticulitis.  Prostate gland and seminal vesicles are unremarkable. No calcified calculi are noted within urinary bladder.  IMPRESSION: 1. There is abnormal segmental dilatation of small bowel in left lower abdomen with fecal like material and some air-fluid levels. Subtle mild stranding of surrounding mesenteric fat suspicious for mesenteric edema or inflammation. There is transition in small bowel caliber with distal small bowel being small caliber. Findings are highly suspicious for small bowel obstruction. Less likely ileus or enteritis 2. There is a low lying cecum with tip in mid pelvis just above the urinary bladder. Abundant stool noted within cecum. Normal appendix. No pericecal inflammation. 3. Gastric distension with fluid suspicious for gastroparesis or gastric ileus. No evidence of gastric outlet obstruction. 4. No nephrolithiasis. No hydronephrosis or hydroureter. No calcified ureteral calculi. 5. No urinary bladder calcified calculi. 6. Left inguinal scrotal canal hernia containing fat measures 3.6 cm without evidence of acute complication.   Electronically Signed   By: Lahoma Crocker M.D.   On: 10/29/2014 21:13    Scheduled Meds: . heparin  5,000 Units Subcutaneous 3 times per day  . pantoprazole (PROTONIX) IV  80 mg Intravenous QHS  . senna  1 tablet Oral BID   Continuous Infusions: . sodium chloride 100 mL/hr at 10/30/14 1230     Time spent: > 35 minutes    Velvet Bathe  [facility] Pager [phone] If 7PM-7AM, please contact night-coverage at [email or web address], password [email or web address] 10/30/2014, 2:33 PM  LOS: 1 day

## 2014-10-30 NOTE — Progress Notes (Signed)
Pt given milk and molasses enema. Pt tolerated well. Enema was successful.

## 2014-10-30 NOTE — Care Management Note (Signed)
    Page 1 of 1   10/30/2014     12:39:30 PM CARE MANAGEMENT NOTE 10/30/2014  Patient:  [name], [name]   Account Number:  [id number]  Date Initiated:  10/30/2014  Documentation initiated by:  Theophilus Kinds  Subjective/Objective Assessment:   Pt admitted from home with SBO. Pt lives with his wife and will return home at discharge. Pt is independent with ADL's.     Action/Plan:   No CM needs noted.   Anticipated DC Date:  11/04/2014   Anticipated DC Plan:  [place]  CM consult      Choice offered to / List presented to:             Status of service:  Completed, signed off Medicare Important Message given?   (If response is "NO", the following Medicare IM given date fields will be blank) Date Medicare IM given:   Medicare IM given by:   Date Additional Medicare IM given:   Additional Medicare IM given by:    Discharge Disposition:  HOME/SELF CARE  Per UR Regulation:    If discussed at Long Length of Stay Meetings, dates discussed:    Comments:  10/30/14 [place], RN BSN CM

## 2014-10-30 NOTE — Consult Note (Signed)
Reason for Consult: Bowel obstruction Referring Physician: Ruthvik Barnaby is an 53 y.o. male.  HPI: Patient is a 53 year old white male who presented with a 24-hour history of worsening abdominal cramping, nausea, vomiting. He states he has never had this in the past. No one else is sick at home. He is passing flatus but has not had a bowel movement in the past 24-48 hrs. He denies any intra-abdominal surgery. Currently, he states his abdomen feels slightly better.  Past Medical History  Diagnosis Date  . GERD (gastroesophageal reflux disease)     Past Surgical History  Procedure Laterality Date  . Wrist surgery Right     otif  . Hand surgery Right     contracture of hand  . Inguinal hernia repair Right 11/23/2013    Procedure: HERNIA REPAIR INGUINAL ADULT;  Surgeon: Scherry Ran, MD;  Location: [facility];  Service: General;  Laterality: Right;  site-inguinal area    History reviewed. No pertinent family history.  Social History:  reports that he has never smoked. He does not have any smokeless tobacco history on file. He reports that he drinks alcohol. He reports that he does not use illicit drugs.  Allergies:  Allergies  Allergen Reactions  . Ibuprofen Swelling  . Morphine And Related Swelling  . Bee Venom Rash    Medications: I have reviewed the patient's current medications.  Results for orders placed or performed during the hospital encounter of 10/29/14 (from the past 48 hour(s))  CBC with Differential     Status: Abnormal   Collection Time: 10/29/14  8:23 PM  Result Value Ref Range   WBC 12.7 (H) 4.0 - 10.5 K/uL   RBC 5.46 4.22 - 5.81 MIL/uL   Hemoglobin 18.5 (H) 13.0 - 17.0 g/dL   HCT 52.0 39.0 - 52.0 %   MCV 95.2 78.0 - 100.0 fL   MCH 33.9 26.0 - 34.0 pg   MCHC 35.6 30.0 - 36.0 g/dL   RDW 12.5 11.5 - 15.5 %   Platelets 255 150 - 400 K/uL   Neutrophils Relative % 82 (H) 43 - 77 %   Neutro Abs 10.5 (H) 1.7 - 7.7 K/uL   Lymphocytes Relative 11 (L)  12 - 46 %   Lymphs Abs 1.3 0.7 - 4.0 K/uL   Monocytes Relative 7 3 - 12 %   Monocytes Absolute 0.9 0.1 - 1.0 K/uL   Eosinophils Relative 0 0 - 5 %   Eosinophils Absolute 0.0 0.0 - 0.7 K/uL   Basophils Relative 0 0 - 1 %   Basophils Absolute 0.0 0.0 - 0.1 K/uL  Comprehensive metabolic panel     Status: Abnormal   Collection Time: 10/29/14  8:23 PM  Result Value Ref Range   Sodium 142 137 - 147 mEq/L   Potassium 3.7 3.7 - 5.3 mEq/L   Chloride 100 96 - 112 mEq/L   CO2 27 19 - 32 mEq/L   Glucose, Bld 104 (H) 70 - 99 mg/dL   BUN 11 6 - 23 mg/dL   Creatinine, Ser 0.84 0.50 - 1.35 mg/dL   Calcium 9.9 8.4 - 10.5 mg/dL   Total Protein 8.2 6.0 - 8.3 g/dL   Albumin 4.7 3.5 - 5.2 g/dL   AST 28 0 - 37 U/L   ALT 39 0 - 53 U/L   Alkaline Phosphatase 93 39 - 117 U/L   Total Bilirubin 1.1 0.3 - 1.2 mg/dL   GFR calc non Af Amer >90 >90  mL/min   GFR calc Af Amer >90 >90 mL/min    Comment: (NOTE) The eGFR has been calculated using the CKD EPI equation. This calculation has not been validated in all clinical situations. eGFR's persistently <90 mL/min signify possible Chronic Kidney Disease.    Anion gap 15 5 - 15  Urinalysis, Routine w reflex microscopic     Status: Abnormal   Collection Time: 10/29/14  9:38 PM  Result Value Ref Range   Color, Urine YELLOW YELLOW   APPearance CLEAR CLEAR   Specific Gravity, Urine 1.020 1.005 - 1.030   pH 7.5 5.0 - 8.0   Glucose, UA NEGATIVE NEGATIVE mg/dL   Hgb urine dipstick NEGATIVE NEGATIVE   Bilirubin Urine SMALL (A) NEGATIVE   Ketones, ur TRACE (A) NEGATIVE mg/dL   Protein, ur 30 (A) NEGATIVE mg/dL   Urobilinogen, UA 1.0 0.0 - 1.0 mg/dL   Nitrite NEGATIVE NEGATIVE   Leukocytes, UA NEGATIVE NEGATIVE  Urine microscopic-add on     Status: Abnormal   Collection Time: 10/29/14  9:38 PM  Result Value Ref Range   Squamous Epithelial / LPF FEW (A) RARE   WBC, UA 0-2 <3 WBC/hpf   RBC / HPF 0-2 <3 RBC/hpf   Bacteria, UA RARE RARE  Lipase, blood      Status: None   Collection Time: 10/29/14 10:04 PM  Result Value Ref Range   Lipase 15 11 - 59 U/L  Lactic acid, plasma     Status: None   Collection Time: 10/29/14 10:06 PM  Result Value Ref Range   Lactic Acid, Venous 1.2 0.5 - 2.2 mmol/L  Comprehensive metabolic panel     Status: Abnormal   Collection Time: 10/30/14  5:35 AM  Result Value Ref Range   Sodium 140 137 - 147 mEq/L   Potassium 3.8 3.7 - 5.3 mEq/L   Chloride 100 96 - 112 mEq/L   CO2 28 19 - 32 mEq/L   Glucose, Bld 172 (H) 70 - 99 mg/dL   BUN 16 6 - 23 mg/dL   Creatinine, Ser 0.98 0.50 - 1.35 mg/dL   Calcium 9.0 8.4 - 10.5 mg/dL   Total Protein 7.1 6.0 - 8.3 g/dL   Albumin 3.9 3.5 - 5.2 g/dL   AST 20 0 - 37 U/L   ALT 30 0 - 53 U/L   Alkaline Phosphatase 78 39 - 117 U/L   Total Bilirubin 1.0 0.3 - 1.2 mg/dL   GFR calc non Af Amer >90 >90 mL/min   GFR calc Af Amer >90 >90 mL/min    Comment: (NOTE) The eGFR has been calculated using the CKD EPI equation. This calculation has not been validated in all clinical situations. eGFR's persistently <90 mL/min signify possible Chronic Kidney Disease.    Anion gap 12 5 - 15    Ct Abdomen Pelvis Wo Contrast  10/29/2014   CLINICAL DATA:  Low back pain for 2 weeks, history of renal stones  EXAM: CT ABDOMEN AND PELVIS WITHOUT CONTRAST  TECHNIQUE: Multidetector CT imaging of the abdomen and pelvis was performed following the standard protocol without IV contrast.  COMPARISON:  None.  FINDINGS: Study is markedly limited without IV and oral contrast. Small hiatal hernia is noted. Sagittal images of the spine shows degenerative changes lower thoracic and upper lumbar spine. Stable chronic mild compression deformity of L1 vertebral body.  There is significant gastric distension with fluid suspicious for ileus or gastroparesis. There is no evidence of gastric outlet obstruction.  Lung bases are  unremarkable.  No calcified gallstones are noted within gallbladder. Unenhanced liver, pancreas,  spleen and adrenal glands are unremarkable. Unenhanced kidneys are symmetrical in size. No nephrolithiasis. No aortic aneurysm. No hydronephrosis or hydroureter. No calcified ureteral calculi are noted.  There is a low lying cecum. Abundant stool noted within cecum. No pericecal inflammation. Normal appendix is noted in mid upper posterior pelvis.  There are abnormal distended small bowel loops in left abdomen and left lower quadrant. Fecal like material noted in small bowel loops in left abdomen axial image 47 and 48. In axial image 37 there is transition point in caliber of small bowel in left lower quadrant. Findings are highly suspicious for partial small bowel obstruction. Less likely ileus or enteritis.  There is mild stranding of the mesenteric fat adjacent to left abdominal distended small bowel loops see coronal image 38. Findings suspicious for mild adjacent mesenteric inflammation or edema.  The distal small bowel is small caliber. There is a left inguinal scrotal canal hernia containing fat measures 3.6 cm without evidence of acute complication.  The transverse colon left colon and proximal sigmoid colon are empty collapsed. Few colonic diverticula are noted in sigmoid colon. No evidence of acute diverticulitis.  Prostate gland and seminal vesicles are unremarkable. No calcified calculi are noted within urinary bladder.  IMPRESSION: 1. There is abnormal segmental dilatation of small bowel in left lower abdomen with fecal like material and some air-fluid levels. Subtle mild stranding of surrounding mesenteric fat suspicious for mesenteric edema or inflammation. There is transition in small bowel caliber with distal small bowel being small caliber. Findings are highly suspicious for small bowel obstruction. Less likely ileus or enteritis 2. There is a low lying cecum with tip in mid pelvis just above the urinary bladder. Abundant stool noted within cecum. Normal appendix. No pericecal inflammation. 3.  Gastric distension with fluid suspicious for gastroparesis or gastric ileus. No evidence of gastric outlet obstruction. 4. No nephrolithiasis. No hydronephrosis or hydroureter. No calcified ureteral calculi. 5. No urinary bladder calcified calculi. 6. Left inguinal scrotal canal hernia containing fat measures 3.6 cm without evidence of acute complication.   Electronically Signed   By: Lahoma Crocker M.D.   On: 10/29/2014 21:13    ROS: See chart Blood pressure 139/97, pulse 83, temperature 98.5 F (36.9 C), temperature source Oral, resp. rate 18, height $RemoveBe'5\' 5"'SzbQjkyka$  (1.651 m), weight 81.4 kg (179 lb 7.3 oz), SpO2 95 %. Physical Exam: Pleasant white male in no acute distress. Abdomen is soft with minimal distention noted. No rigidity is noted. No hepatosplenomegaly or masses noted. Rectal examination was deferred at this time.  Assessment/Plan: Impression: Bowel obstruction of unknown etiology versus ileus Plan: Will order enema to stool load in right-sided colon. If patient develops multiple episodes of emesis, NG tube would be indicated. We'll follow with you. No need for acute surgical intervention at this time.  Cornie Herrington A 10/30/2014, 8:54 AM

## 2014-10-30 NOTE — Progress Notes (Signed)
Subjective: The patient is fairly comfortable this morning. His had no further vomiting and minimal abdominal pain. He states he has not had a bowel movement in approximately 3 days. He presented to the emergency department with a history of nausea and vomiting and epigastric pain. CT of the abdomen does show evidence of small bowel obstruction  Objective: Vital signs in last 24 hours: Temp:  [98 F (36.7 C)-99 F (37.2 C)] 98.5 F (36.9 C) (11/25 0610) Pulse Rate:  [83-92] 83 (11/25 0610) Resp:  [18] 18 (11/25 0610) BP: (129-139)/(95-97) 139/97 mmHg (11/24 2254) SpO2:  [95 %-96 %] 95 % (11/25 0610) Weight:  [81.4 kg (179 lb 7.3 oz)-81.647 kg (180 lb)] 81.4 kg (179 lb 7.3 oz) (11/24 2254) Weight change:  Last BM Date: 10/29/14  Intake/Output from previous day:   Intake/Output this shift:    Physical Exam: Gen. appearance patient is alert and comfortable  HEENT negative  Neck supple no JVD or thyroid abnormalities  Heart regular rhythm no murmurs  Lungs clear to P&A  Abdomen no palpable organs or masses no tenderness   Skin warm and dry   Recent Labs  10/29/14 2023  WBC 12.7*  HGB 18.5*  HCT 52.0  PLT 255   BMET  Recent Labs  10/29/14 2023 10/30/14 0535  NA 142 140  K 3.7 3.8  CL 100 100  CO2 27 28  GLUCOSE 104* 172*  BUN 11 16  CREATININE 0.84 0.98  CALCIUM 9.9 9.0    Studies/Results: Ct Abdomen Pelvis Wo Contrast  10/29/2014   CLINICAL DATA:  Low back pain for 2 weeks, history of renal stones  EXAM: CT ABDOMEN AND PELVIS WITHOUT CONTRAST  TECHNIQUE: Multidetector CT imaging of the abdomen and pelvis was performed following the standard protocol without IV contrast.  COMPARISON:  None.  FINDINGS: Study is markedly limited without IV and oral contrast. Small hiatal hernia is noted. Sagittal images of the spine shows degenerative changes lower thoracic and upper lumbar spine. Stable chronic mild compression deformity of L1 vertebral body.  There is  significant gastric distension with fluid suspicious for ileus or gastroparesis. There is no evidence of gastric outlet obstruction.  Lung bases are unremarkable.  No calcified gallstones are noted within gallbladder. Unenhanced liver, pancreas, spleen and adrenal glands are unremarkable. Unenhanced kidneys are symmetrical in size. No nephrolithiasis. No aortic aneurysm. No hydronephrosis or hydroureter. No calcified ureteral calculi are noted.  There is a low lying cecum. Abundant stool noted within cecum. No pericecal inflammation. Normal appendix is noted in mid upper posterior pelvis.  There are abnormal distended small bowel loops in left abdomen and left lower quadrant. Fecal like material noted in small bowel loops in left abdomen axial image 47 and 48. In axial image 37 there is transition point in caliber of small bowel in left lower quadrant. Findings are highly suspicious for partial small bowel obstruction. Less likely ileus or enteritis.  There is mild stranding of the mesenteric fat adjacent to left abdominal distended small bowel loops see coronal image 38. Findings suspicious for mild adjacent mesenteric inflammation or edema.  The distal small bowel is small caliber. There is a left inguinal scrotal canal hernia containing fat measures 3.6 cm without evidence of acute complication.  The transverse colon left colon and proximal sigmoid colon are empty collapsed. Few colonic diverticula are noted in sigmoid colon. No evidence of acute diverticulitis.  Prostate gland and seminal vesicles are unremarkable. No calcified calculi are noted within urinary bladder.  IMPRESSION: 1. There is abnormal segmental dilatation of small bowel in left lower abdomen with fecal like material and some air-fluid levels. Subtle mild stranding of surrounding mesenteric fat suspicious for mesenteric edema or inflammation. There is transition in small bowel caliber with distal small bowel being small caliber. Findings are  highly suspicious for small bowel obstruction. Less likely ileus or enteritis 2. There is a low lying cecum with tip in mid pelvis just above the urinary bladder. Abundant stool noted within cecum. Normal appendix. No pericecal inflammation. 3. Gastric distension with fluid suspicious for gastroparesis or gastric ileus. No evidence of gastric outlet obstruction. 4. No nephrolithiasis. No hydronephrosis or hydroureter. No calcified ureteral calculi. 5. No urinary bladder calcified calculi. 6. Left inguinal scrotal canal hernia containing fat measures 3.6 cm without evidence of acute complication.   Electronically Signed   By: Lahoma Crocker M.D.   On: 10/29/2014 21:13    Medications:  . heparin  5,000 Units Subcutaneous 3 times per day  . pantoprazole (PROTONIX) IV  80 mg Intravenous QHS  . senna  1 tablet Oral BID    . sodium chloride 100 mL/hr at 10/29/14 2353     Assessment/Plan: 1. Patient admitted with small bowel obstruction plan to continue IV fluids-patient will be seen by surgery today   LOS: 1 day   Ad Guttman G 10/30/2014, 6:39 AM

## 2014-10-31 NOTE — Discharge Summary (Signed)
Physician Discharge Summary  [name] HTD:[id number] DOB: 06/16/1961 DOA: 10/29/2014  PCP: Lanette Hampshire, MD  Admit date: 10/29/2014 Discharge date: 10/31/2014     Discharge Diagnoses:  1. 1. Small bowel obstruction 2. Gastroesophageal reflux 3.   Discharge Condition: Improved Disposition: Home  Diet recommendation: Soft diet  Filed Weights   10/29/14 1920 10/29/14 2254  Weight: 81.647 kg (180 lb) 81.4 kg (179 lb 7.3 oz)    History of present illness:  The patient was admitted with a history of low back pain which been present approximately 2 weeks. This was intermittent and sharp he did have also epigastric pain and 2 episodes of nausea and presented in this fashion to the ED. Extremities showed evidence of small bowel obstruction mild. The patient was subsequently admitted Hospital Course:  The patient was started on intravenous fluids half-normal saline 100 mL an hour. He refused NG tube but agreed to it if he had further episodes of nausea which he did not. White blood count was slightly elevated. He was seen by surgical service Dr. Arnoldo Morale. It was his opinion that he did not need surgery and that diet could be continued and advanced to soft diet. He continued on this regimen had 1 bowel movement. It was felt he could be discharged and followed as an outpatient. He was continued on Nexium 40 mg daily.   Discharge Instructions It was recommended he advance his diet and continue medications listed below.  It is also recommended he follow-up with his primary care physician in approximately 1 week or sooner if needed    Medication List    STOP taking these medications        aspirin 81 MG chewable tablet      TAKE these medications        esomeprazole 40 MG capsule  Commonly known as:  NEXIUM  Take 40 mg by mouth every morning.     LORazepam 1 MG tablet  Commonly known as:  ATIVAN  Take 1 tablet (1 mg total) by mouth 3 (three) times daily as needed (nausea  / vomiting / dizziness).       Allergies  Allergen Reactions  . Ibuprofen Swelling  . Morphine And Related Swelling  . Bee Venom Rash    The results of significant diagnostics from this hospitalization (including imaging, microbiology, ancillary and laboratory) are listed below for reference.    Significant Diagnostic Studies: Ct Abdomen Pelvis Wo Contrast  10/29/2014   CLINICAL DATA:  Low back pain for 2 weeks, history of renal stones  EXAM: CT ABDOMEN AND PELVIS WITHOUT CONTRAST  TECHNIQUE: Multidetector CT imaging of the abdomen and pelvis was performed following the standard protocol without IV contrast.  COMPARISON:  None.  FINDINGS: Study is markedly limited without IV and oral contrast. Small hiatal hernia is noted. Sagittal images of the spine shows degenerative changes lower thoracic and upper lumbar spine. Stable chronic mild compression deformity of L1 vertebral body.  There is significant gastric distension with fluid suspicious for ileus or gastroparesis. There is no evidence of gastric outlet obstruction.  Lung bases are unremarkable.  No calcified gallstones are noted within gallbladder. Unenhanced liver, pancreas, spleen and adrenal glands are unremarkable. Unenhanced kidneys are symmetrical in size. No nephrolithiasis. No aortic aneurysm. No hydronephrosis or hydroureter. No calcified ureteral calculi are noted.  There is a low lying cecum. Abundant stool noted within cecum. No pericecal inflammation. Normal appendix is noted in mid upper posterior pelvis.  There are abnormal distended  small bowel loops in left abdomen and left lower quadrant. Fecal like material noted in small bowel loops in left abdomen axial image 47 and 48. In axial image 37 there is transition point in caliber of small bowel in left lower quadrant. Findings are highly suspicious for partial small bowel obstruction. Less likely ileus or enteritis.  There is mild stranding of the mesenteric fat adjacent to left  abdominal distended small bowel loops see coronal image 38. Findings suspicious for mild adjacent mesenteric inflammation or edema.  The distal small bowel is small caliber. There is a left inguinal scrotal canal hernia containing fat measures 3.6 cm without evidence of acute complication.  The transverse colon left colon and proximal sigmoid colon are empty collapsed. Few colonic diverticula are noted in sigmoid colon. No evidence of acute diverticulitis.  Prostate gland and seminal vesicles are unremarkable. No calcified calculi are noted within urinary bladder.  IMPRESSION: 1. There is abnormal segmental dilatation of small bowel in left lower abdomen with fecal like material and some air-fluid levels. Subtle mild stranding of surrounding mesenteric fat suspicious for mesenteric edema or inflammation. There is transition in small bowel caliber with distal small bowel being small caliber. Findings are highly suspicious for small bowel obstruction. Less likely ileus or enteritis 2. There is a low lying cecum with tip in mid pelvis just above the urinary bladder. Abundant stool noted within cecum. Normal appendix. No pericecal inflammation. 3. Gastric distension with fluid suspicious for gastroparesis or gastric ileus. No evidence of gastric outlet obstruction. 4. No nephrolithiasis. No hydronephrosis or hydroureter. No calcified ureteral calculi. 5. No urinary bladder calcified calculi. 6. Left inguinal scrotal canal hernia containing fat measures 3.6 cm without evidence of acute complication.   Electronically Signed   By: Lahoma Crocker M.D.   On: 10/29/2014 21:13    Microbiology: No results found for this or any previous visit (from the past 240 hour(s)).   Labs: Basic Metabolic Panel:  Recent Labs Lab 10/29/14 2023 10/30/14 0535  NA 142 140  K 3.7 3.8  CL 100 100  CO2 27 28  GLUCOSE 104* 172*  BUN 11 16  CREATININE 0.84 0.98  CALCIUM 9.9 9.0   Liver Function Tests:  Recent Labs Lab  10/29/14 2023 10/30/14 0535  AST 28 20  ALT 39 30  ALKPHOS 93 78  BILITOT 1.1 1.0  PROT 8.2 7.1  ALBUMIN 4.7 3.9    Recent Labs Lab 10/29/14 2204  LIPASE 15   No results for input(s): AMMONIA in the last 168 hours. CBC:  Recent Labs Lab 10/29/14 2023  WBC 12.7*  NEUTROABS 10.5*  HGB 18.5*  HCT 52.0  MCV 95.2  PLT 255   Cardiac Enzymes: No results for input(s): CKTOTAL, CKMB, CKMBINDEX, TROPONINI in the last 168 hours. BNP: BNP (last 3 results) No results for input(s): PROBNP in the last 8760 hours. CBG: No results for input(s): GLUCAP in the last 168 hours.  Principal Problem:   Small bowel obstruction Active Problems:   Esophageal reflux   Time coordinating discharge: 30 minutes Signed:  Marjean Donna, MD 10/31/2014, 3:32 PM

## 2014-10-31 NOTE — Plan of Care (Signed)
Problem: Phase I Progression Outcomes Goal: OOB as tolerated unless otherwise ordered Outcome: Completed/Met Date Met:  10/31/14 Goal: Voiding-avoid urinary catheter unless indicated Outcome: Completed/Met Date Met:  10/31/14  Problem: Phase II Progression Outcomes Goal: Progress activity as tolerated unless otherwise ordered Outcome: Completed/Met Date Met:  10/31/14 Goal: Vital signs remain stable Outcome: Completed/Met Date Met:  10/31/14 Goal: Obtain order to discontinue catheter if appropriate Outcome: Not Applicable Date Met:  03/83/33

## 2014-10-31 NOTE — Progress Notes (Signed)
Subjective: The patient is fairly comfortable this morning. He did have one bowel movement yesterday. Has minimal pain and no nausea. He was admitted with small bowel obstruction  Objective: Vital signs in last 24 hours: Temp:  [98.3 F (36.8 C)-98.7 F (37.1 C)] 98.3 F (36.8 C) (11/26 0610) Pulse Rate:  [65-70] 68 (11/26 0610) Resp:  [18] 18 (11/26 0610) BP: (109-120)/(68-77) 109/68 mmHg (11/26 0610) SpO2:  [97 %] 97 % (11/26 0610) Weight change:  Last BM Date: 10/30/14  Intake/Output from previous day: 11/25 0701 - 11/26 0700 In: 2927.1 [I.V.:2727.1; IV Piggyback:200] Out: -  Intake/Output this shift:    Physical Exam: Gen. appearance the patient is alert and comfortable  HEENT negative  Neck supple no JVD or thyroid abnormalities  Heart regular rhythm no murmurs  Lungs clear to P&A  Abdomen no palpable organs or masses no tenderness no active bowel sounds  Skin warm and dry   Recent Labs  10/29/14 2023  WBC 12.7*  HGB 18.5*  HCT 52.0  PLT 255   BMET  Recent Labs  10/29/14 2023 10/30/14 0535  NA 142 140  K 3.7 3.8  CL 100 100  CO2 27 28  GLUCOSE 104* 172*  BUN 11 16  CREATININE 0.84 0.98  CALCIUM 9.9 9.0    Studies/Results: Ct Abdomen Pelvis Wo Contrast  10/29/2014   CLINICAL DATA:  Low back pain for 2 weeks, history of renal stones  EXAM: CT ABDOMEN AND PELVIS WITHOUT CONTRAST  TECHNIQUE: Multidetector CT imaging of the abdomen and pelvis was performed following the standard protocol without IV contrast.  COMPARISON:  None.  FINDINGS: Study is markedly limited without IV and oral contrast. Small hiatal hernia is noted. Sagittal images of the spine shows degenerative changes lower thoracic and upper lumbar spine. Stable chronic mild compression deformity of L1 vertebral body.  There is significant gastric distension with fluid suspicious for ileus or gastroparesis. There is no evidence of gastric outlet obstruction.  Lung bases are unremarkable.   No calcified gallstones are noted within gallbladder. Unenhanced liver, pancreas, spleen and adrenal glands are unremarkable. Unenhanced kidneys are symmetrical in size. No nephrolithiasis. No aortic aneurysm. No hydronephrosis or hydroureter. No calcified ureteral calculi are noted.  There is a low lying cecum. Abundant stool noted within cecum. No pericecal inflammation. Normal appendix is noted in mid upper posterior pelvis.  There are abnormal distended small bowel loops in left abdomen and left lower quadrant. Fecal like material noted in small bowel loops in left abdomen axial image 47 and 48. In axial image 37 there is transition point in caliber of small bowel in left lower quadrant. Findings are highly suspicious for partial small bowel obstruction. Less likely ileus or enteritis.  There is mild stranding of the mesenteric fat adjacent to left abdominal distended small bowel loops see coronal image 38. Findings suspicious for mild adjacent mesenteric inflammation or edema.  The distal small bowel is small caliber. There is a left inguinal scrotal canal hernia containing fat measures 3.6 cm without evidence of acute complication.  The transverse colon left colon and proximal sigmoid colon are empty collapsed. Few colonic diverticula are noted in sigmoid colon. No evidence of acute diverticulitis.  Prostate gland and seminal vesicles are unremarkable. No calcified calculi are noted within urinary bladder.  IMPRESSION: 1. There is abnormal segmental dilatation of small bowel in left lower abdomen with fecal like material and some air-fluid levels. Subtle mild stranding of surrounding mesenteric fat suspicious for mesenteric edema  or inflammation. There is transition in small bowel caliber with distal small bowel being small caliber. Findings are highly suspicious for small bowel obstruction. Less likely ileus or enteritis 2. There is a low lying cecum with tip in mid pelvis just above the urinary bladder.  Abundant stool noted within cecum. Normal appendix. No pericecal inflammation. 3. Gastric distension with fluid suspicious for gastroparesis or gastric ileus. No evidence of gastric outlet obstruction. 4. No nephrolithiasis. No hydronephrosis or hydroureter. No calcified ureteral calculi. 5. No urinary bladder calcified calculi. 6. Left inguinal scrotal canal hernia containing fat measures 3.6 cm without evidence of acute complication.   Electronically Signed   By: Lahoma Crocker M.D.   On: 10/29/2014 21:13    Medications:  . heparin  5,000 Units Subcutaneous 3 times per day  . pantoprazole (PROTONIX) IV  80 mg Intravenous QHS  . senna  1 tablet Oral BID    . dextrose 5 % and 0.45 % NaCl with KCl 20 mEq/L 75 mL/hr at 10/31/14 0508     Assessment/Plan: 1. Patient was admitted with small bowel obstruction to continue IV fluids and probably advance diet today of no surgery anticipated   LOS: 2 days   Hazelee Harbold G 10/31/2014, 7:15 AM

## 2014-10-31 NOTE — Progress Notes (Signed)
Subjective: Feels better. No nausea or vomiting noted. No abdominal pain.  Objective: Vital signs in last 24 hours: Temp:  [98.3 F (36.8 C)-98.7 F (37.1 C)] 98.3 F (36.8 C) (11/26 0610) Pulse Rate:  [65-70] 68 (11/26 0610) Resp:  [18] 18 (11/26 0610) BP: (109-120)/(68-77) 109/68 mmHg (11/26 0610) SpO2:  [97 %] 97 % (11/26 0610) Last BM Date: 10/30/14  Intake/Output from previous day: 11/25 0701 - 11/26 0700 In: 2927.1 [I.V.:2727.1; IV Piggyback:200] Out: -  Intake/Output this shift:    General appearance: alert, cooperative and no distress GI: soft, non-tender; bowel sounds normal; no masses,  no organomegaly  Lab Results:   Recent Labs  10/29/14 2023  WBC 12.7*  HGB 18.5*  HCT 52.0  PLT 255   BMET  Recent Labs  10/29/14 2023 10/30/14 0535  NA 142 140  K 3.7 3.8  CL 100 100  CO2 27 28  GLUCOSE 104* 172*  BUN 11 16  CREATININE 0.84 0.98  CALCIUM 9.9 9.0   PT/INR No results for input(s): LABPROT, INR in the last 72 hours.  Studies/Results: Ct Abdomen Pelvis Wo Contrast  10/29/2014   CLINICAL DATA:  Low back pain for 2 weeks, history of renal stones  EXAM: CT ABDOMEN AND PELVIS WITHOUT CONTRAST  TECHNIQUE: Multidetector CT imaging of the abdomen and pelvis was performed following the standard protocol without IV contrast.  COMPARISON:  None.  FINDINGS: Study is markedly limited without IV and oral contrast. Small hiatal hernia is noted. Sagittal images of the spine shows degenerative changes lower thoracic and upper lumbar spine. Stable chronic mild compression deformity of L1 vertebral body.  There is significant gastric distension with fluid suspicious for ileus or gastroparesis. There is no evidence of gastric outlet obstruction.  Lung bases are unremarkable.  No calcified gallstones are noted within gallbladder. Unenhanced liver, pancreas, spleen and adrenal glands are unremarkable. Unenhanced kidneys are symmetrical in size. No nephrolithiasis. No  aortic aneurysm. No hydronephrosis or hydroureter. No calcified ureteral calculi are noted.  There is a low lying cecum. Abundant stool noted within cecum. No pericecal inflammation. Normal appendix is noted in mid upper posterior pelvis.  There are abnormal distended small bowel loops in left abdomen and left lower quadrant. Fecal like material noted in small bowel loops in left abdomen axial image 47 and 48. In axial image 37 there is transition point in caliber of small bowel in left lower quadrant. Findings are highly suspicious for partial small bowel obstruction. Less likely ileus or enteritis.  There is mild stranding of the mesenteric fat adjacent to left abdominal distended small bowel loops see coronal image 38. Findings suspicious for mild adjacent mesenteric inflammation or edema.  The distal small bowel is small caliber. There is a left inguinal scrotal canal hernia containing fat measures 3.6 cm without evidence of acute complication.  The transverse colon left colon and proximal sigmoid colon are empty collapsed. Few colonic diverticula are noted in sigmoid colon. No evidence of acute diverticulitis.  Prostate gland and seminal vesicles are unremarkable. No calcified calculi are noted within urinary bladder.  IMPRESSION: 1. There is abnormal segmental dilatation of small bowel in left lower abdomen with fecal like material and some air-fluid levels. Subtle mild stranding of surrounding mesenteric fat suspicious for mesenteric edema or inflammation. There is transition in small bowel caliber with distal small bowel being small caliber. Findings are highly suspicious for small bowel obstruction. Less likely ileus or enteritis 2. There is a low lying cecum with  tip in mid pelvis just above the urinary bladder. Abundant stool noted within cecum. Normal appendix. No pericecal inflammation. 3. Gastric distension with fluid suspicious for gastroparesis or gastric ileus. No evidence of gastric outlet  obstruction. 4. No nephrolithiasis. No hydronephrosis or hydroureter. No calcified ureteral calculi. 5. No urinary bladder calcified calculi. 6. Left inguinal scrotal canal hernia containing fat measures 3.6 cm without evidence of acute complication.   Electronically Signed   By: Lahoma Crocker M.D.   On: 10/29/2014 21:13    Anti-infectives: Anti-infectives    None      Assessment: Impression: Small bowel obstruction of unknown etiology, resolving Plan: Advance to full liquid diet. No need for acute surgical intervention at this time.  LOS: 2 days    [name] A 10/31/2014

## 2014-10-31 NOTE — Progress Notes (Signed)
Dr. Everette Rank currently managing patient medically.  Will sign off and all questions regarding further management should be addressed to this physician.  We are available should any questions arise.  Velvet Bathe  MD

## 2014-11-06 ENCOUNTER — Emergency Department (HOSPITAL_COMMUNITY): Payer: BC Managed Care – PPO

## 2014-11-06 ENCOUNTER — Emergency Department (HOSPITAL_COMMUNITY)
Admission: EM | Admit: 2014-11-06 | Discharge: 2014-11-07 | Disposition: A | Discharge status: Home / Self Care | Payer: BC Managed Care – PPO | Attending: Emergency Medicine | Admitting: Emergency Medicine

## 2014-11-06 ENCOUNTER — Encounter (HOSPITAL_COMMUNITY): Payer: Self-pay

## 2014-11-06 DIAGNOSIS — K219 Gastro-esophageal reflux disease without esophagitis: Secondary | ICD-10-CM | POA: Insufficient documentation

## 2014-11-06 DIAGNOSIS — R1031 Right lower quadrant pain: Secondary | ICD-10-CM

## 2014-11-06 DIAGNOSIS — R109 Unspecified abdominal pain: Secondary | ICD-10-CM

## 2014-11-06 DIAGNOSIS — Z79899 Other long term (current) drug therapy: Secondary | ICD-10-CM | POA: Diagnosis not present

## 2014-11-06 DIAGNOSIS — N281 Cyst of kidney, acquired: Secondary | ICD-10-CM | POA: Diagnosis not present

## 2014-11-06 HISTORY — DX: Unspecified intestinal obstruction, unspecified as to partial versus complete obstruction: K56.609

## 2014-11-06 LAB — COMPREHENSIVE METABOLIC PANEL
ALBUMIN: 4.2 g/dL (ref 3.5–5.2)
ALT: 33 U/L (ref 0–53)
ANION GAP: 16 — AB (ref 5–15)
AST: 20 U/L (ref 0–37)
Alkaline Phosphatase: 78 U/L (ref 39–117)
BILIRUBIN TOTAL: 0.5 mg/dL (ref 0.3–1.2)
BUN: 8 mg/dL (ref 6–23)
CHLORIDE: 99 meq/L (ref 96–112)
CO2: 25 mEq/L (ref 19–32)
Calcium: 9.4 mg/dL (ref 8.4–10.5)
Creatinine, Ser: 0.84 mg/dL (ref 0.50–1.35)
GFR calc Af Amer: >90 mL/min (ref >90)
Glucose, Bld: 88 mg/dL (ref 70–99)
Potassium: 3.3 mEq/L — ABNORMAL LOW (ref 3.7–5.3)
Sodium: 140 mEq/L (ref 137–147)
Total Protein: 7.5 g/dL (ref 6.0–8.3)

## 2014-11-06 LAB — URINALYSIS, ROUTINE W REFLEX MICROSCOPIC
BILIRUBIN URINE: NEGATIVE
Glucose, UA: NEGATIVE mg/dL
KETONES UR: NEGATIVE mg/dL
Leukocytes, UA: NEGATIVE
NITRITE: NEGATIVE
Protein, ur: NEGATIVE mg/dL
SPECIFIC GRAVITY, URINE: 1.01 (ref 1.005–1.030)
UROBILINOGEN UA: 0.2 mg/dL (ref 0.0–1.0)
pH: 7 (ref 5.0–8.0)

## 2014-11-06 LAB — CBC WITH DIFFERENTIAL/PLATELET
Basophils Absolute: 0 10*3/uL (ref 0.0–0.1)
Basophils Relative: 0 % (ref 0–1)
Eosinophils Absolute: 0 10*3/uL (ref 0.0–0.7)
Eosinophils Relative: 0 % (ref 0–5)
HCT: 46.4 % (ref 39.0–52.0)
HEMOGLOBIN: 16.9 g/dL (ref 13.0–17.0)
Lymphocytes Relative: 24 % (ref 12–46)
Lymphs Abs: 3.1 10*3/uL (ref 0.7–4.0)
MCH: 34 pg (ref 26.0–34.0)
MCHC: 36.4 g/dL — ABNORMAL HIGH (ref 30.0–36.0)
MCV: 93.4 fL (ref 78.0–100.0)
MONO ABS: 0.8 10*3/uL (ref 0.1–1.0)
Monocytes Relative: 6 % (ref 3–12)
NEUTROS ABS: 8.9 10*3/uL — AB (ref 1.7–7.7)
Neutrophils Relative %: 70 % (ref 43–77)
Platelets: 287 10*3/uL (ref 150–400)
RBC: 4.97 MIL/uL (ref 4.22–5.81)
RDW: 12.3 % (ref 11.5–15.5)
WBC: 12.8 10*3/uL — ABNORMAL HIGH (ref 4.0–10.5)

## 2014-11-06 LAB — LIPASE, BLOOD: Lipase: 40 U/L (ref 11–59)

## 2014-11-06 LAB — URINE MICROSCOPIC-ADD ON

## 2014-11-06 MED ORDER — FENTANYL CITRATE 0.05 MG/ML IJ SOLN
100.0000 ug | Freq: Once | INTRAMUSCULAR | Status: AC
  Administered 2014-11-06: 100 ug via INTRAVENOUS
  Filled 2014-11-06: qty 2

## 2014-11-06 MED ORDER — FENTANYL CITRATE 0.05 MG/ML IJ SOLN
50.0000 ug | Freq: Once | INTRAMUSCULAR | Status: AC
  Administered 2014-11-06: 50 ug via INTRAVENOUS
  Filled 2014-11-06: qty 2

## 2014-11-06 NOTE — ED Notes (Signed)
It is the same back pain as I had last week with the blockage.

## 2014-11-06 NOTE — ED Notes (Signed)
I was over here for back pain last Tuesday. It is even worse today per pt. They did a CT scan and it showed that I have a bowel blockage.

## 2014-11-06 NOTE — ED Provider Notes (Signed)
CSN: [id number]     Arrival date & time 11/06/14  2102 History  This chart was scribed for Carmin Muskrat, MD by Tula Nakayama, ED Scribe. This patient was seen in room [facility] and the patient's care was started at 9:48 PM.    Chief Complaint  Patient presents with  . Back Pain   The history is provided by the patient. No language interpreter was used.   HPI Comments: [name] is a 53 y.o. male who presents to the Emergency Department complaining of episodes of sharp back pain that are accompanied by sharp, lower abdominal pains and started earlier today. He states difficulty breathing as an associated symptom. Pt has a history of similar symptoms which occurred last week when he was hospitalized for bowel obstruction. Pt states that he was administered enema and fluids in the hospital with relief to symptoms. He had a bowel movement today, but notes straining. Prior to last week, pt had history of bowel obstruction 8 years ago. He also has history of hernia repair surgery. Pt notes that he works in a [employer], [employer]. He denies vomiting, CP and fever as associated symptoms.    Past Medical History  Diagnosis Date  . GERD (gastroesophageal reflux disease)   . Small bowel obstruction    Past Surgical History  Procedure Laterality Date  . Wrist surgery Right     otif  . Hand surgery Right     contracture of hand  . Inguinal hernia repair Right 11/23/2013    Procedure: HERNIA REPAIR INGUINAL ADULT;  Surgeon: Scherry Ran, MD;  Location: [facility];  Service: General;  Laterality: Right;  site-inguinal area   No family history on file. History  Substance Use Topics  . Smoking status: Never Smoker   . Smokeless tobacco: Not on file  . Alcohol Use: Yes     Comment: occ    Review of Systems  Constitutional: Negative for fever.       Per HPI, otherwise negative  HENT:       Per HPI, otherwise negative  Respiratory: Negative for shortness of breath.        Per HPI,  otherwise negative  Cardiovascular: Negative for chest pain.       Per HPI, otherwise negative  Gastrointestinal: Positive for abdominal pain. Negative for vomiting.  Endocrine:       Negative aside from HPI  Genitourinary:       Neg aside from HPI   Musculoskeletal: Positive for back pain.       Per HPI, otherwise negative  Skin: Negative.   Neurological: Negative for syncope.      Allergies  Ibuprofen; Morphine and related; and Bee venom  Home Medications   Prior to Admission medications   Medication Sig Start Date End Date Taking? Authorizing Provider  esomeprazole (NEXIUM) 40 MG capsule Take 40 mg by mouth every morning.    Yes Historical Provider, MD  LORazepam (ATIVAN) 1 MG tablet Take 1 tablet (1 mg total) by mouth 3 (three) times daily as needed (nausea / vomiting / dizziness). Patient not taking: Reported on 10/29/2014 06/02/14   Carmin Muskrat, MD  traMADol (ULTRAM) 50 MG tablet Take 1 tablet (50 mg total) by mouth every 6 (six) hours as needed. 11/07/14   Carmin Muskrat, MD   BP 124/85 mmHg  Pulse 74  Temp(Src) 98.1 F (36.7 C) (Oral)  Resp 15  Ht 5\' 5"  (1.651 m)  Wt 180 lb (81.647 kg)  BMI 29.95 kg/m2  SpO2 93% Physical Exam  Constitutional: He is oriented to person, place, and time. He appears well-developed. No distress.  HENT:  Head: Normocephalic and atraumatic.  Eyes: Conjunctivae and EOM are normal.  Cardiovascular: Normal rate and regular rhythm.   Pulmonary/Chest: Effort normal. No stridor. No respiratory distress.  Abdominal: He exhibits no distension.  Musculoskeletal: He exhibits no edema.  Neurological: He is alert and oriented to person, place, and time.  Skin: Skin is warm and dry.  Psychiatric: He has a normal mood and affect.  Nursing note and vitals reviewed.   ED Course  Procedures (including critical care time)  COORDINATION OF CARE: 12:11 AM Discussed treatment plan with pt at bedside and pt agreed to plan.   Labs Review Labs  Reviewed  CBC WITH DIFFERENTIAL - Abnormal; Notable for the following:    WBC 12.8 (*)    MCHC 36.4 (*)    Neutro Abs 8.9 (*)    All other components within normal limits  COMPREHENSIVE METABOLIC PANEL - Abnormal; Notable for the following:    Potassium 3.3 (*)    Anion gap 16 (*)    All other components within normal limits  URINALYSIS, ROUTINE W REFLEX MICROSCOPIC - Abnormal; Notable for the following:    Color, Urine STRAW (*)    Hgb urine dipstick TRACE (*)    All other components within normal limits  LIPASE, BLOOD  URINE MICROSCOPIC-ADD ON    Imaging Review Ct Abdomen Pelvis Wo Contrast  11/06/2014   CLINICAL DATA:  Acute onset of sharp lower abdominal pain and sharp back pain. Initial encounter.  EXAM: CT ABDOMEN AND PELVIS WITHOUT CONTRAST  TECHNIQUE: Multidetector CT imaging of the abdomen and pelvis was performed following the standard protocol without IV contrast.  COMPARISON:  CT abdomen and pelvis performed from 10/29/2014  FINDINGS: The visualized lung bases are clear.  The liver and spleen are unremarkable in appearance. The gallbladder is within normal limits. The pancreas and adrenal glands are unremarkable.  Minimal nonspecific perinephric stranding is noted bilaterally. A 1.2 cm cyst is suggested at the interpole region of the right kidney. The kidneys are otherwise unremarkable. There is no evidence of hydronephrosis. No renal or ureteral stones are seen.  No free fluid is identified. The small bowel is unremarkable in appearance. The stomach is within normal limits. No acute vascular abnormalities are seen.  The appendix is normal in caliber and contains air, without evidence for appendicitis. The colon is unremarkable in appearance.  The bladder is mildly distended and grossly unremarkable. The prostate remains normal in size. A small left inguinal hernia is seen, containing only fat. No inguinal lymphadenopathy is seen.  No acute osseous abnormalities are identified.   IMPRESSION: 1. No acute abnormality seen within the abdomen or pelvis. 2. Small left inguinal hernia, containing only fat. 3. Small right renal cyst seen.   Electronically Signed   By: Garald Balding M.D.   On: 11/06/2014 23:54   12:11 AM On repeat exam the patient appears substantially more comfortable, with no ongoing complaints. I discussed all findings, including CT results, with possible renal cyst with the patient and his wife. He will follow-up with urology, primary care. MDM   Final diagnoses:  Right lower quadrant abdominal pain  Flank pain  Renal cyst     I personally performed the services described in this documentation, which was scribed in my presence. The recorded information has been reviewed and is accurate.   Patient presents several weeks after bowel  obstruction, now with back pain, right inguinal pain reminiscent to the patient of his bowel obstruction. Patient had pain resolution with analgesia here, and labs are actually reassuring, as is the patient's CT scan. Finding of a renal cyst may account for some of his pain, and there is no evidence for peritonitis, bacteremia, sepsis, urinary obstruction. Patient's pain resolved substantially, and he was discharged in stable condition to follow-up as an outpatient.  Carmin Muskrat, MD 11/07/14 [phone]

## 2014-11-06 NOTE — ED Notes (Signed)
EDP at bedside  

## 2014-11-07 MED ORDER — TRAMADOL HCL 50 MG PO TABS
50.0000 mg | ORAL_TABLET | Freq: Once | ORAL | Status: AC
  Administered 2014-11-07: 50 mg via ORAL
  Filled 2014-11-07: qty 1

## 2014-11-07 MED ORDER — TRAMADOL HCL 50 MG PO TABS: 50.0000 mg | ORAL_TABLET | Freq: Four times a day (QID) | ORAL | Status: DC | PRN

## 2014-11-07 NOTE — Discharge Instructions (Signed)
As discussed, your evaluation today has been largely reassuring.  But, it is important that you monitor your condition carefully, and do not hesitate to return to the ED if you develop new, or concerning changes in your condition. ? ?Otherwise, please follow-up with your physician for appropriate ongoing care. ? ?

## 2014-11-08 ENCOUNTER — Ambulatory Visit (HOSPITAL_COMMUNITY)
Admission: RE | Admit: 2014-11-08 | Discharge: 2014-11-08 | Disposition: A | Discharge status: Home / Self Care | Payer: BC Managed Care – PPO | Source: Ambulatory Visit | Attending: Family Medicine | Admitting: Family Medicine

## 2014-11-08 ENCOUNTER — Other Ambulatory Visit (HOSPITAL_COMMUNITY): Payer: Self-pay | Admitting: Family Medicine

## 2014-11-08 DIAGNOSIS — M545 Low back pain, unspecified: Secondary | ICD-10-CM

## 2014-11-12 ENCOUNTER — Other Ambulatory Visit (HOSPITAL_COMMUNITY): Payer: Self-pay | Admitting: Family Medicine

## 2014-11-12 DIAGNOSIS — M545 Low back pain: Secondary | ICD-10-CM

## 2014-11-18 ENCOUNTER — Ambulatory Visit (HOSPITAL_COMMUNITY)
Admission: RE | Admit: 2014-11-18 | Discharge: 2014-11-18 | Disposition: A | Discharge status: Home / Self Care | Payer: BC Managed Care – PPO | Source: Ambulatory Visit | Attending: Family Medicine | Admitting: Family Medicine

## 2014-11-18 DIAGNOSIS — M545 Low back pain: Secondary | ICD-10-CM | POA: Insufficient documentation

## 2014-11-18 DIAGNOSIS — M4856XG Collapsed vertebra, not elsewhere classified, lumbar region, subsequent encounter for fracture with delayed healing: Secondary | ICD-10-CM | POA: Diagnosis not present

## 2014-11-18 DIAGNOSIS — M5136 Other intervertebral disc degeneration, lumbar region: Secondary | ICD-10-CM | POA: Insufficient documentation

## 2014-11-18 DIAGNOSIS — M8938 Hypertrophy of bone, other site: Secondary | ICD-10-CM | POA: Diagnosis not present

## 2014-11-18 DIAGNOSIS — M5144 Schmorl's nodes, thoracic region: Secondary | ICD-10-CM | POA: Insufficient documentation

## 2014-11-18 DIAGNOSIS — M5126 Other intervertebral disc displacement, lumbar region: Secondary | ICD-10-CM | POA: Diagnosis not present

## 2014-11-18 DIAGNOSIS — G8929 Other chronic pain: Secondary | ICD-10-CM | POA: Insufficient documentation

## 2014-11-18 DIAGNOSIS — M5146 Schmorl's nodes, lumbar region: Secondary | ICD-10-CM | POA: Insufficient documentation

## 2014-12-03 ENCOUNTER — Ambulatory Visit (INDEPENDENT_AMBULATORY_CARE_PROVIDER_SITE_OTHER): Payer: BC Managed Care – PPO | Admitting: Orthopedic Surgery

## 2014-12-03 ENCOUNTER — Encounter: Payer: Self-pay | Admitting: Orthopedic Surgery

## 2014-12-03 VITALS — BP 132/75 | Ht 65.0 in | Wt 180.0 lb

## 2014-12-03 DIAGNOSIS — M25561 Pain in right knee: Secondary | ICD-10-CM

## 2014-12-03 DIAGNOSIS — M1711 Unilateral primary osteoarthritis, right knee: Secondary | ICD-10-CM

## 2014-12-03 MED ORDER — MELOXICAM 7.5 MG PO TABS: 7.5000 mg | ORAL_TABLET | Freq: Every day | ORAL | Status: DC

## 2014-12-03 NOTE — Progress Notes (Signed)
Patient ID: [name], male   DOB: 29-Dec-1960, 53 y.o.   MRN: [id number] Chief Complaint  Patient presents with  . Follow-up    6 week recheck right knee    Status post injection right knee for osteoarthritis. MRI showed no evidence of meniscal tear  Patient says knees been doing well.  Review of systems occasional swelling right knee joint. He says it is aching today but the mobility seems to have helped along with the injection.  He is ambulatory without a limp has no palpable tenderness full range of motion ligament stable [place]ray is negative motor exam normal skin intact good distal pulses  Impression osteoarthritis controlled with anti-inflammatory  Continue anti-inflammatory prescription written Follow-up as needed  Meds ordered this encounter  Medications  . DISCONTD: meloxicam (MOBIC) 7.5 MG tablet    Sig: Take 1 tablet (7.5 mg total) by mouth daily.    Dispense:  60 tablet    Refill:  5  . meloxicam (MOBIC) 7.5 MG tablet    Sig: Take 1 tablet (7.5 mg total) by mouth daily.    Dispense:  60 tablet    Refill:  5

## 2014-12-03 NOTE — Patient Instructions (Signed)
Continue mobic

## 2015-09-18 ENCOUNTER — Encounter: Payer: Self-pay | Admitting: Internal Medicine

## 2015-10-05 ENCOUNTER — Emergency Department (HOSPITAL_COMMUNITY): Payer: BLUE CROSS/BLUE SHIELD

## 2015-10-05 ENCOUNTER — Emergency Department (HOSPITAL_COMMUNITY)
Admission: EM | Admit: 2015-10-05 | Discharge: 2015-10-05 | Disposition: A | Discharge status: Home / Self Care | Payer: BLUE CROSS/BLUE SHIELD | Attending: Emergency Medicine | Admitting: Emergency Medicine

## 2015-10-05 ENCOUNTER — Encounter (HOSPITAL_COMMUNITY): Payer: Self-pay | Admitting: Emergency Medicine

## 2015-10-05 DIAGNOSIS — R0600 Dyspnea, unspecified: Secondary | ICD-10-CM | POA: Diagnosis not present

## 2015-10-05 DIAGNOSIS — Z79899 Other long term (current) drug therapy: Secondary | ICD-10-CM | POA: Insufficient documentation

## 2015-10-05 DIAGNOSIS — K219 Gastro-esophageal reflux disease without esophagitis: Secondary | ICD-10-CM | POA: Insufficient documentation

## 2015-10-05 DIAGNOSIS — R000 Tachycardia, unspecified: Secondary | ICD-10-CM | POA: Insufficient documentation

## 2015-10-05 DIAGNOSIS — R05 Cough: Secondary | ICD-10-CM | POA: Diagnosis not present

## 2015-10-05 DIAGNOSIS — R0981 Nasal congestion: Secondary | ICD-10-CM | POA: Diagnosis not present

## 2015-10-05 DIAGNOSIS — R0602 Shortness of breath: Secondary | ICD-10-CM | POA: Diagnosis present

## 2015-10-05 LAB — COMPREHENSIVE METABOLIC PANEL
ALBUMIN: 4.3 g/dL (ref 3.5–5.0)
ALK PHOS: 74 U/L (ref 38–126)
ALT: 49 U/L (ref 17–63)
AST: 38 U/L (ref 15–41)
Anion gap: 10 (ref 5–15)
BILIRUBIN TOTAL: 1 mg/dL (ref 0.3–1.2)
BUN: 12 mg/dL (ref 6–20)
CALCIUM: 9.3 mg/dL (ref 8.9–10.3)
CO2: 24 mmol/L (ref 22–32)
Chloride: 104 mmol/L (ref 101–111)
Creatinine, Ser: 0.78 mg/dL (ref 0.61–1.24)
GFR calc Af Amer: >60 mL/min (ref >60)
GFR calc non Af Amer: >60 mL/min (ref >60)
GLUCOSE: 161 mg/dL — AB (ref 65–99)
POTASSIUM: 3 mmol/L — AB (ref 3.5–5.1)
Sodium: 138 mmol/L (ref 135–145)
Total Protein: 7.5 g/dL (ref 6.5–8.1)

## 2015-10-05 LAB — CBC WITH DIFFERENTIAL/PLATELET
BASOS ABS: 0 10*3/uL (ref 0.0–0.1)
Basophils Relative: 0 %
Eosinophils Absolute: 0 10*3/uL (ref 0.0–0.7)
Eosinophils Relative: 0 %
HEMATOCRIT: 47.9 % (ref 39.0–52.0)
HEMOGLOBIN: 17.2 g/dL — AB (ref 13.0–17.0)
Lymphocytes Relative: 17 %
Lymphs Abs: 1.7 10*3/uL (ref 0.7–4.0)
MCH: 34.6 pg — ABNORMAL HIGH (ref 26.0–34.0)
MCHC: 35.9 g/dL (ref 30.0–36.0)
MCV: 96.4 fL (ref 78.0–100.0)
Monocytes Absolute: 0.8 10*3/uL (ref 0.1–1.0)
Monocytes Relative: 8 %
NEUTROS ABS: 7.3 10*3/uL (ref 1.7–7.7)
NEUTROS PCT: 75 %
Platelets: 242 10*3/uL (ref 150–400)
RBC: 4.97 MIL/uL (ref 4.22–5.81)
RDW: 12.4 % (ref 11.5–15.5)
WBC: 9.8 10*3/uL (ref 4.0–10.5)

## 2015-10-05 LAB — TROPONIN I: Troponin I: <0.03 ng/mL (ref <0.031)

## 2015-10-05 MED ORDER — AMOXICILLIN 500 MG PO CAPS: 500.0000 mg | ORAL_CAPSULE | Freq: Three times a day (TID) | ORAL | Status: DC

## 2015-10-05 MED ORDER — FLUTICASONE PROPIONATE 50 MCG/ACT NA SUSP
NASAL | Status: DC
Start: 2015-10-05 — End: 2016-08-13

## 2015-10-05 NOTE — ED Provider Notes (Signed)
CSN: [id number]     Arrival date & time 10/05/15  1831 History   First MD Initiated Contact with Patient 10/05/15 1849     Chief Complaint  Patient presents with  . Shortness of Breath      HPI  Patient presents for evaluation with a chief complaint of "my sinuses keep plugging up".  He relates his symptoms to a week ago when his mother-in-law passed away. He and his wife slept at her house for 2 nights. He states he was sleeping on the couch. During the middle the night his "sinuses closed off". He went into another bedroom and states they opened up and he slept. This happened tonight 0. On the second night the carbon monoxide alarm beat but did not continue to alarm. He was asymptomatic. He is not headaches or nausea. He has not slept in that house for over 5 days now. He states he continues to have plugging of the sinuses. When he walks or ambulates he feels like he can't breathe through his nose. He gets a bit of a dry cough with this as well.  He denies chest pain. He had his mother-in-law's nebulizer at the house because he brought it home from her house after she passed away. He tried using it today. He did an albuterol treatment he cannot tell me how much. He states afterwards his head hurt and his heart was going fast and he felt short of breath and he presents here.  His no chest pain. Had no fevers. He is had no swelling of his extremities.  Past Medical History  Diagnosis Date  . GERD (gastroesophageal reflux disease)   . Small bowel obstruction [facility])    Past Surgical History  Procedure Laterality Date  . Wrist surgery Right     otif  . Hand surgery Right     contracture of hand  . Inguinal hernia repair Right 11/23/2013    Procedure: HERNIA REPAIR INGUINAL ADULT;  Surgeon: Scherry Ran, MD;  Location: [facility];  Service: General;  Laterality: Right;  site-inguinal area   History reviewed. No pertinent family history. Social History  Substance Use Topics  .  Smoking status: Never Smoker   . Smokeless tobacco: None  . Alcohol Use: Yes     Comment: occ    Review of Systems  Constitutional: Negative for fever, chills, diaphoresis, appetite change and fatigue.  HENT: Positive for congestion. Negative for mouth sores, sore throat and trouble swallowing.   Eyes: Negative for visual disturbance.  Respiratory: Positive for cough and shortness of breath. Negative for chest tightness and wheezing.   Cardiovascular: Negative for chest pain.  Gastrointestinal: Negative for nausea, vomiting, abdominal pain, diarrhea and abdominal distention.  Endocrine: Negative for polydipsia, polyphagia and polyuria.  Genitourinary: Negative for dysuria, frequency and hematuria.  Musculoskeletal: Negative for gait problem.  Skin: Negative for color change, pallor and rash.  Neurological: Negative for dizziness, syncope, light-headedness and headaches.  Hematological: Does not bruise/bleed easily.  Psychiatric/Behavioral: Negative for behavioral problems and confusion.      Allergies  Ibuprofen; Morphine and related; and Bee venom  Home Medications   Prior to Admission medications   Medication Sig Start Date End Date Taking? Authorizing Provider  esomeprazole (NEXIUM) 40 MG capsule Take 40 mg by mouth every morning.    Yes Historical Provider, MD  amoxicillin (AMOXIL) 500 MG capsule Take 1 capsule (500 mg total) by mouth 3 (three) times daily. 10/05/15   Tanna Furry, MD  fluticasone (  FLONASE) 50 MCG/ACT nasal spray 1 spray each nares bid 10/05/15   Tanna Furry, MD   BP 159/127 mmHg  Pulse 114  Temp(Src) 98.2 F (36.8 C) (Oral)  Resp 24  Ht 5\' 5"  (1.651 m)  Wt 180 lb (81.647 kg)  BMI 29.95 kg/m2  SpO2 98% Physical Exam  Constitutional: He is oriented to person, place, and time. He appears well-developed and well-nourished. No distress.  HENT:  Head: Normocephalic.  Nasal congestion. Normal pharynx.  Eyes: Conjunctivae are normal. Pupils are equal,  round, and reactive to light. No scleral icterus.  Neck: Normal range of motion. Neck supple. No thyromegaly present.  Cardiovascular: Normal rate, regular rhythm and S1 normal.  Exam reveals no gallop and no friction rub.   No murmur heard. Sinus tachycardia 105. No JVD. No S3 or 4 gallop. No dependent edema.  Pulmonary/Chest: Effort normal and breath sounds normal. No respiratory distress. He has no wheezes. He has no rales.  Abdominal: Soft. Bowel sounds are normal. He exhibits no distension. There is no tenderness. There is no rebound.  Musculoskeletal: Normal range of motion.  Neurological: He is alert and oriented to person, place, and time.  Skin: Skin is warm and dry. No rash noted.  Psychiatric: He has a normal mood and affect. His behavior is normal.    ED Course  Procedures (including critical care time) Labs Review Labs Reviewed  CBC WITH DIFFERENTIAL/PLATELET - Abnormal; Notable for the following:    Hemoglobin 17.2 (*)    MCH 34.6 (*)    All other components within normal limits  COMPREHENSIVE METABOLIC PANEL - Abnormal; Notable for the following:    Potassium 3.0 (*)    Glucose, Bld 161 (*)    All other components within normal limits  TROPONIN I    Imaging Review Dg Chest 2 View  10/05/2015  CLINICAL DATA:  Shortness of breath on exertion for 1 week. EXAM: CHEST  2 VIEW COMPARISON:  Chest radiograph October 09, 2011 FINDINGS: Cardiomediastinal silhouette is normal. Mild chronic bronchitic changes. The lungs are clear without pleural effusions or focal consolidations. Trachea projects midline and there is no pneumothorax. Soft tissue planes and included osseous structures are non-suspicious. Moderate chronic approximate L2 compression fracture. IMPRESSION: Mild chronic bronchitic changes.  No focal consolidation. Electronically Signed   By: Elon Alas M.D.   On: 10/05/2015 19:42   I have personally reviewed and evaluated these images and lab results as part of  my medical decision-making.   EKG Interpretation None      MDM   Final diagnoses:  Dyspnea  Sinus congestion    His symptoms improved. Heart rate improves his blood pressure is improved as well. He remains well oxygenated at 94% on room air. He complains of some sinus pressure. Prescription for Flonase and amoxicillin. Primary care follow-up.  EKG without changes. Normal troponin. Normal x-ray. Afebrile. No risks for pulmonary embolus. No clinical or radiographic pneumonia.    Tanna Furry, MD 10/05/15 2038

## 2015-10-05 NOTE — Discharge Instructions (Signed)
Prescriptions as directed.  Recheck with your primary care physician.  ER with any changes.

## 2015-10-05 NOTE — ED Notes (Signed)
Pt was 94% on room air, placed on 2L 02

## 2015-10-05 NOTE — ED Notes (Signed)
Discharge instructions given, pt demonstrated teach back and verbal understanding. No concerns voiced.  

## 2015-10-05 NOTE — ED Notes (Signed)
PT c/o SOB on exertion worsening x1 week. PT states his carbon monoxide detector went off a week ago and states SOB and cough since then.

## 2016-03-31 DIAGNOSIS — M722 Plantar fascial fibromatosis: Secondary | ICD-10-CM | POA: Diagnosis not present

## 2016-03-31 DIAGNOSIS — M79672 Pain in left foot: Secondary | ICD-10-CM | POA: Diagnosis not present

## 2016-08-13 ENCOUNTER — Encounter (INDEPENDENT_AMBULATORY_CARE_PROVIDER_SITE_OTHER): Payer: Self-pay

## 2016-08-13 ENCOUNTER — Encounter: Payer: Self-pay | Admitting: Family Medicine

## 2016-08-13 ENCOUNTER — Ambulatory Visit (INDEPENDENT_AMBULATORY_CARE_PROVIDER_SITE_OTHER): Payer: BLUE CROSS/BLUE SHIELD | Admitting: Family Medicine

## 2016-08-13 VITALS — BP 127/82 | HR 77 | Temp 98.0°F | Ht 65.0 in | Wt 190.2 lb

## 2016-08-13 DIAGNOSIS — R5383 Other fatigue: Secondary | ICD-10-CM

## 2016-08-13 DIAGNOSIS — Z13220 Encounter for screening for lipoid disorders: Secondary | ICD-10-CM

## 2016-08-13 DIAGNOSIS — J209 Acute bronchitis, unspecified: Secondary | ICD-10-CM | POA: Diagnosis not present

## 2016-08-13 DIAGNOSIS — Z23 Encounter for immunization: Secondary | ICD-10-CM

## 2016-08-13 DIAGNOSIS — Z131 Encounter for screening for diabetes mellitus: Secondary | ICD-10-CM

## 2016-08-13 DIAGNOSIS — K219 Gastro-esophageal reflux disease without esophagitis: Secondary | ICD-10-CM

## 2016-08-13 MED ORDER — PANTOPRAZOLE SODIUM 40 MG PO TBEC: 40.0000 mg | DELAYED_RELEASE_TABLET | Freq: Every day | ORAL | 5 refills | Status: DC

## 2016-08-13 MED ORDER — ALBUTEROL SULFATE HFA 108 (90 BASE) MCG/ACT IN AERS: 2.0000 | INHALATION_SPRAY | Freq: Four times a day (QID) | RESPIRATORY_TRACT | 5 refills | Status: DC | PRN

## 2016-08-13 NOTE — Progress Notes (Signed)
Patient left today without receiving Tdap vaccine.  Ewell Poe to delete charges so patient will not be charged.

## 2016-08-13 NOTE — Progress Notes (Signed)
BP 127/82   Pulse 77   Temp 98 F (36.7 C) (Oral)   Ht '5\' 5"'$  (1.651 m)   Wt 190 lb 3.2 oz (86.3 kg)   BMI 31.65 kg/m    Subjective:    Patient ID: [name], male    DOB: [birth year], 55 y.o.   MRN: [id number]  HPI: [name] is a 55 y.o. male presenting on 08/13/2016 for Establish Care   HPI GERD Patient is coming in to establish care with our practice. He has known GERD and has been on Protonix 40 mg for quite some time. He says does very well for a denies any major issues with it. He does not have any abdominal pain or heartburn acid reflux today and has not for quite some time while on the medication. He denies any blood in his stool.  Acute bronchitis About once a year this time of year patient has an upper respiratory infection that develops into acute bronchitis and is down into his chest. He has an albuterol inhaler that he uses just for these couple weeks when he gets like this that helps him get through it. He denies any fevers or chills or shortness of breath but just has coughing spells and some chest congestion that the albuterol helps greatly with. He has never been a smoker and has never been exposed to any major contaminants in the workplace. He just gets this once a year and then is gone and he is greatly rest the year.  Fatigue Patient complains of fatigue and lack of energy and feeling tired all the time. He does admit that he does not sleep well at night and mostly tender in the week he only sleeps 4-5 hours a night. He cannot pinpoint exactly why he can't sleep at night but he just wakes up and then he can go back to sleep. He does admit that he snores loud enough that his wife makes him sleep in a separate room most of the time. He does not recall gasping for air or stopping breathing and since his wife sleeps in another room he does not know if she has heard it either. He denies any palpitations or diarrhea or constipation or issues with hot or cold. He  denies any bleeding or blood in his stool.  Relevant past medical, surgical, family and social history reviewed and updated as indicated. Interim medical history since our last visit reviewed. Allergies and medications reviewed and updated.  Review of Systems  Constitutional: Positive for fatigue. Negative for chills and fever.  HENT: Positive for congestion. Negative for postnasal drip, rhinorrhea, sinus pressure, sneezing and sore throat.   Eyes: Negative for discharge.  Respiratory: Positive for cough. Negative for chest tightness, shortness of breath and wheezing.   Cardiovascular: Negative for chest pain, palpitations and leg swelling.  Endocrine: Negative for cold intolerance, heat intolerance, polydipsia and polyuria.  Musculoskeletal: Negative for back pain and gait problem.  Skin: Negative for rash.  Psychiatric/Behavioral: Positive for sleep disturbance. Negative for dysphoric mood, self-injury and suicidal ideas.  All other systems reviewed and are negative.   Per HPI unless specifically indicated above  Social History   Social History  . Marital status: Married    Spouse name: N/A  . Number of children: N/A  . Years of education: N/A   Occupational History  . Not on file.   Social History Main Topics  . Smoking status: Never Smoker  . Smokeless tobacco: Never Used  .  Alcohol use 2.4 oz/week    4 Cans of beer per week     Comment: occ  . Drug use: No  . Sexual activity: Yes    Birth control/ protection: None     Comment: married 2005   Other Topics Concern  . Not on file   Social History Narrative  . No narrative on file    Past Surgical History:  Procedure Laterality Date  . HAND SURGERY Right    contracture of hand  . INGUINAL HERNIA REPAIR Right 11/23/2013   Procedure: HERNIA REPAIR INGUINAL ADULT;  Surgeon: Scherry Ran, MD;  Location: [facility];  Service: General;  Laterality: Right;  site-inguinal area  . VASECTOMY    . WRIST SURGERY Right      otif    Family History  Problem Relation Age of Onset  . Stroke Mother   . Cancer Mother     colon  . Alzheimer's disease Father     74 years  . Heart disease Father     atrial fibri  . Heart disease Sister   . Early death Brother   . Cancer Maternal Grandmother       Medication List       Accurate as of 08/13/16  9:33 AM. Always use your most recent med list.          albuterol 108 (90 Base) MCG/ACT inhaler Commonly known as:  PROAIR HFA Inhale 2 puffs into the lungs every 6 (six) hours as needed for wheezing or shortness of breath.   pantoprazole 40 MG tablet Commonly known as:  PROTONIX Take 1 tablet (40 mg total) by mouth daily.          Objective:    BP 127/82   Pulse 77   Temp 98 F (36.7 C) (Oral)   Ht '5\' 5"'$  (1.651 m)   Wt 190 lb 3.2 oz (86.3 kg)   BMI 31.65 kg/m   Wt Readings from Last 3 Encounters:  08/13/16 190 lb 3.2 oz (86.3 kg)  10/05/15 180 lb (81.6 kg)  12/03/14 180 lb (81.6 kg)    Physical Exam  Constitutional: He is oriented to person, place, and time. He appears well-developed and well-nourished. No distress.  Eyes: Conjunctivae are normal. Right eye exhibits no discharge. No scleral icterus.  Neck: Neck supple. No thyromegaly present.  Cardiovascular: Normal rate, regular rhythm, normal heart sounds and intact distal pulses.   No murmur heard. Pulmonary/Chest: Effort normal and breath sounds normal. No respiratory distress. He has no wheezes. He has no rales.  Abdominal: Soft. Bowel sounds are normal. He exhibits no distension. There is no tenderness. There is no rebound.  Musculoskeletal: Normal range of motion. He exhibits no edema.  Lymphadenopathy:    He has no cervical adenopathy.  Neurological: He is alert and oriented to person, place, and time. Coordination normal.  Skin: Skin is warm and dry. No rash noted. He is not diaphoretic.  Psychiatric: He has a normal mood and affect. His behavior is normal.  Nursing note and  vitals reviewed.     Assessment & Plan:   Problem List Items Addressed This Visit      Digestive   Esophageal reflux   Relevant Medications   pantoprazole (PROTONIX) 40 MG tablet    Other Visit Diagnoses    Need for prophylactic vaccination with combined diphtheria-tetanus-pertussis (DTP) vaccine    -  Primary   Relevant Orders   Tdap vaccine greater than or equal to 7yo  IM (Completed)   Acute bronchitis, unspecified organism       Relevant Medications   albuterol (PROAIR HFA) 108 (90 Base) MCG/ACT inhaler   Screening for diabetes mellitus       Relevant Orders   CMP14+EGFR   Screening for cholesterol level       Relevant Orders   Lipid panel   Other fatigue       Relevant Orders   CBC with Differential/Platelet   Thyroid Panel With TSH      Follow up plan: Return in about 1 year (around 08/13/2017), or if symptoms worsen or fail to improve.  Caryl Pina, MD [facility] 08/13/2016, 9:33 AM

## 2016-08-14 LAB — CBC WITH DIFFERENTIAL/PLATELET
Basophils Absolute: 0 10*3/uL (ref 0.0–0.2)
Basos: 0 %
EOS (ABSOLUTE): 0.1 10*3/uL (ref 0.0–0.4)
Eos: 1 %
HEMATOCRIT: 51.1 % — AB (ref 37.5–51.0)
HEMOGLOBIN: 17.4 g/dL (ref 12.6–17.7)
IMMATURE GRANS (ABS): 0 10*3/uL (ref 0.0–0.1)
Immature Granulocytes: 0 %
LYMPHS: 27 %
Lymphocytes Absolute: 2.4 10*3/uL (ref 0.7–3.1)
MCH: 33.9 pg — AB (ref 26.6–33.0)
MCHC: 34.1 g/dL (ref 31.5–35.7)
MCV: 99 fL — ABNORMAL HIGH (ref 79–97)
Monocytes Absolute: 0.8 10*3/uL (ref 0.1–0.9)
Monocytes: 9 %
Neutrophils Absolute: 5.5 10*3/uL (ref 1.4–7.0)
Neutrophils: 63 %
Platelets: 297 10*3/uL (ref 150–379)
RBC: 5.14 x10E6/uL (ref 4.14–5.80)
RDW: 12.7 % (ref 12.3–15.4)
WBC: 8.7 10*3/uL (ref 3.4–10.8)

## 2016-08-14 LAB — CMP14+EGFR
A/G RATIO: 1.6 (ref 1.2–2.2)
ALBUMIN: 4.6 g/dL (ref 3.5–5.5)
ALT: 38 IU/L (ref 0–44)
AST: 24 IU/L (ref 0–40)
Alkaline Phosphatase: 91 IU/L (ref 39–117)
BILIRUBIN TOTAL: 0.6 mg/dL (ref 0.0–1.2)
BUN / CREAT RATIO: 12 (ref 9–20)
BUN: 11 mg/dL (ref 6–24)
CHLORIDE: 100 mmol/L (ref 96–106)
CO2: 23 mmol/L (ref 18–29)
Calcium: 9.7 mg/dL (ref 8.7–10.2)
Creatinine, Ser: 0.95 mg/dL (ref 0.76–1.27)
GFR calc Af Amer: 105 mL/min/{1.73_m2} (ref >59)
GFR calc non Af Amer: 91 mL/min/{1.73_m2} (ref >59)
GLOBULIN, TOTAL: 2.8 g/dL (ref 1.5–4.5)
Glucose: 73 mg/dL (ref 65–99)
Potassium: 4.1 mmol/L (ref 3.5–5.2)
Sodium: 143 mmol/L (ref 134–144)
Total Protein: 7.4 g/dL (ref 6.0–8.5)

## 2016-08-14 LAB — THYROID PANEL WITH TSH
Free Thyroxine Index: 2.8 (ref 1.2–4.9)
T3 Uptake Ratio: 26 % (ref 24–39)
T4 TOTAL: 10.7 ug/dL (ref 4.5–12.0)
TSH: 1.13 u[IU]/mL (ref 0.450–4.500)

## 2016-08-14 LAB — LIPID PANEL
CHOLESTEROL TOTAL: 217 mg/dL — AB (ref 100–199)
Chol/HDL Ratio: 5.3 ratio units — ABNORMAL HIGH (ref 0.0–5.0)
HDL: 41 mg/dL (ref >39)
LDL CALC: 145 mg/dL — AB (ref 0–99)
Triglycerides: 153 mg/dL — ABNORMAL HIGH (ref 0–149)
VLDL Cholesterol Cal: 31 mg/dL (ref 5–40)

## 2016-08-18 ENCOUNTER — Telehealth: Payer: Self-pay | Admitting: Family Medicine

## 2016-08-18 NOTE — Telephone Encounter (Signed)
Denied.

## 2016-10-04 ENCOUNTER — Encounter: Payer: Self-pay | Admitting: Pediatrics

## 2016-10-04 ENCOUNTER — Ambulatory Visit (INDEPENDENT_AMBULATORY_CARE_PROVIDER_SITE_OTHER): Payer: BLUE CROSS/BLUE SHIELD | Admitting: Pediatrics

## 2016-10-04 VITALS — BP 136/88 | HR 90 | Temp 97.5°F | Ht 65.0 in | Wt 194.0 lb

## 2016-10-04 DIAGNOSIS — M79641 Pain in right hand: Secondary | ICD-10-CM | POA: Diagnosis not present

## 2016-10-04 NOTE — Progress Notes (Signed)
  Subjective:   Patient ID: [name], male    DOB: 1961-10-15, 55 y.o.   MRN: [id number] CC: Hand Pain (right)  HPI: [name] is a 55 y.o. male presenting for Hand Pain (right)  Had surgery a couple of years ago in [place] at [employer] [facility] in [employer] R hand with knot that is getting progressively bigger over past 2 mo Weakness in 4th and 5th fingers R hand getting worse over the same time period                                          Relevant past medical, surgical, family and social history reviewed. Allergies and medications reviewed and updated. History  Smoking Status  . Never Smoker  Smokeless Tobacco  . Never Used   ROS: Per HPI   Objective:    BP 136/88   Pulse 90   Temp 97.5 F (36.4 C) (Oral)   Ht 5\' 5"  (1.651 m)   Wt 194 lb (88 kg)   BMI 32.28 kg/m   Wt Readings from Last 3 Encounters:  10/04/16 194 lb (88 kg)  08/13/16 190 lb 3.2 oz (86.3 kg)  10/05/15 180 lb (81.6 kg)    Gen: NAD, alert, cooperative with exam, NCAT EYES: EOMI, no conjunctival injection, or no icterus CV: NRRR, normal S1/S2, radial pulse 2+ R side Resp: CTABL, no wheezes, normal WOB Ext: No edema, warm Neuro: Alert and oriented, hand grip 4/5 R hand, 5/5 L hand. Sensation intact b/l hands MSK: flexor tendon 4th finger with nodule near MCP joint, smooth flexion of finger, no triggering, 4th, 5th finger contracts/flexes when relaxed. Callus formation over nodule  Assessment & Plan:  [name] was seen today for hand pain.  Diagnoses and all orders for this visit:  Right hand pain Nodule over flexor tendon 4th finger R hand, getting bigger, more bothersome Similar over 3rd finger 3 years ago that was removed Now affecting his work at [employer] refer to ortho. Previous surgery by Antionette Char  Follow up plan: As needed Assunta Found, MD [place]

## 2016-10-19 ENCOUNTER — Ambulatory Visit (INDEPENDENT_AMBULATORY_CARE_PROVIDER_SITE_OTHER): Payer: Self-pay

## 2016-10-19 ENCOUNTER — Ambulatory Visit (INDEPENDENT_AMBULATORY_CARE_PROVIDER_SITE_OTHER): Payer: BLUE CROSS/BLUE SHIELD | Admitting: Orthopaedic Surgery

## 2016-10-19 ENCOUNTER — Encounter (INDEPENDENT_AMBULATORY_CARE_PROVIDER_SITE_OTHER): Payer: Self-pay | Admitting: Orthopaedic Surgery

## 2016-10-19 DIAGNOSIS — M79641 Pain in right hand: Secondary | ICD-10-CM

## 2016-10-19 NOTE — Progress Notes (Signed)
Office Visit Note   Patient: [name]           Date of Birth: 10/29/1961           MRN: [id number] Visit Date: 10/19/2016              Requested by: Worthy Rancher, MD [address] [place], [place] [address] PCP: [name] Dettinger, MD   Assessment & Plan: Visit Diagnoses:  1. Pain in right hand     Plan: Referral to Dr. Burney Gauze for evaluation of Dupuytren's contracture. Follow-up as needed  Follow-Up Instructions: Return if symptoms worsen or fail to improve.   Orders:  Orders Placed This Encounter  Procedures  . XR Hand Complete Right  . Ambulatory referral to Orthopedic Surgery   No orders of the defined types were placed in this encounter.     Procedures: No procedures performed   Clinical Data: No additional findings.   Subjective: Chief Complaint  Patient presents with  . Right Hand - Cyst    HPI Patient is 55 year old gentleman with history of right hand Dupuytren's contracture status post release back in 2013 comes in with worsening pain over the palmar region of the fifth metacarpal head.  He endorses that the pain is posterior with burning numbness tingling and has not gotten any better. He has trouble using his hand due to pain. Denies any signs or symptoms of infection. The prominence of his hand makes it difficult for him to use the hand. Review of Systems  Negative except for history of present illness. Objective: Vital Signs: There were no vitals taken for this visit.  Physical Exam Well-developed well-nourished no acute distress alert and oriented 3 nonlabored breathing normal judgments affect Ortho Exam Exam of the right hand shows status post previous surgery for palmar fascia release. He has hypertrophic skin changes in his palm in line with the fifth digit. He does not have any fixed contractures. I do not appreciate any infection. There is no triggering. Specialty Comments:  No specialty comments available.  Imaging: Xr  Hand Complete Right  Result Date: 10/19/2016 Negative for acute findings    PMFS History: Patient Active Problem List   Diagnosis Date Noted  . Small bowel obstruction 10/29/2014  . Esophageal reflux 10/29/2014  . Primary osteoarthritis of right knee 10/22/2014  . Inguinal hernia 11/23/2013   Past Medical History:  Diagnosis Date  . Allergy   . Arthritis   . GERD (gastroesophageal reflux disease)   . Small bowel obstruction     Family History  Problem Relation Age of Onset  . Stroke Mother   . Cancer Mother     colon  . Alzheimer's disease Father     59 years  . Heart disease Father     atrial fibri  . Heart disease Sister   . Early death Brother   . Cancer Maternal Grandmother     Past Surgical History:  Procedure Laterality Date  . HAND SURGERY Right    contracture of hand  . INGUINAL HERNIA REPAIR Right 11/23/2013   Procedure: HERNIA REPAIR INGUINAL ADULT;  Surgeon: Scherry Ran, MD;  Location: [facility];  Service: General;  Laterality: Right;  site-inguinal area  . VASECTOMY    . WRIST SURGERY Right    otif   Social History   Occupational History  . Not on file.   Social History Main Topics  . Smoking status: Never Smoker  . Smokeless tobacco: Never Used  .  Alcohol use 2.4 oz/week    4 Cans of beer per week     Comment: occ  . Drug use: No  . Sexual activity: Yes    Birth control/ protection: None     Comment: married 2005

## 2016-10-22 ENCOUNTER — Telehealth (INDEPENDENT_AMBULATORY_CARE_PROVIDER_SITE_OTHER): Payer: Self-pay | Admitting: Orthopaedic Surgery

## 2016-10-22 NOTE — Telephone Encounter (Signed)
Mr. Forcucci called saying he has an appointment with a hand specialist (Dr. Bertis Ruddy office on [facility]) on November 22nd at 9:30am. According to the Practice, they need a referral ("the papers" as the pt put it) faxed to their location from our office. Please give him a phone call regarding this if needed.  Pt's ph# [phone]  Thank you.

## 2016-10-25 NOTE — Telephone Encounter (Signed)
Faxed office note  to Dr. Bertis Ruddy office on [facility] [place] Pt aware.

## 2016-10-27 DIAGNOSIS — R202 Paresthesia of skin: Secondary | ICD-10-CM | POA: Diagnosis not present

## 2016-10-27 DIAGNOSIS — R200 Anesthesia of skin: Secondary | ICD-10-CM | POA: Insufficient documentation

## 2016-10-27 DIAGNOSIS — M720 Palmar fascial fibromatosis [Dupuytren]: Secondary | ICD-10-CM | POA: Diagnosis not present

## 2017-01-27 ENCOUNTER — Other Ambulatory Visit: Payer: Self-pay | Admitting: Family Medicine

## 2017-01-27 DIAGNOSIS — K219 Gastro-esophageal reflux disease without esophagitis: Secondary | ICD-10-CM

## 2017-03-30 ENCOUNTER — Other Ambulatory Visit: Payer: Self-pay | Admitting: Family Medicine

## 2017-03-30 DIAGNOSIS — K219 Gastro-esophageal reflux disease without esophagitis: Secondary | ICD-10-CM

## 2017-05-11 ENCOUNTER — Encounter: Payer: Self-pay | Admitting: Family Medicine

## 2017-05-11 ENCOUNTER — Ambulatory Visit (INDEPENDENT_AMBULATORY_CARE_PROVIDER_SITE_OTHER): Payer: BLUE CROSS/BLUE SHIELD

## 2017-05-11 ENCOUNTER — Ambulatory Visit (INDEPENDENT_AMBULATORY_CARE_PROVIDER_SITE_OTHER): Payer: BLUE CROSS/BLUE SHIELD | Admitting: Family Medicine

## 2017-05-11 VITALS — BP 132/87 | HR 90 | Temp 99.1°F | Ht 65.0 in | Wt 187.2 lb

## 2017-05-11 DIAGNOSIS — R059 Cough, unspecified: Secondary | ICD-10-CM

## 2017-05-11 DIAGNOSIS — M62838 Other muscle spasm: Secondary | ICD-10-CM | POA: Diagnosis not present

## 2017-05-11 DIAGNOSIS — K219 Gastro-esophageal reflux disease without esophagitis: Secondary | ICD-10-CM | POA: Diagnosis not present

## 2017-05-11 DIAGNOSIS — R05 Cough: Secondary | ICD-10-CM

## 2017-05-11 LAB — CMP14+EGFR
ALT: 28 [IU]/L (ref 0–44)
AST: 18 [IU]/L (ref 0–40)
Albumin/Globulin Ratio: 1.7 (ref 1.2–2.2)
Albumin: 4.8 g/dL (ref 3.5–5.5)
Alkaline Phosphatase: 88 [IU]/L (ref 39–117)
BUN/Creatinine Ratio: 7 — ABNORMAL LOW (ref 9–20)
BUN: 7 mg/dL (ref 6–24)
Bilirubin Total: 0.5 mg/dL (ref 0.0–1.2)
CO2: 24 mmol/L (ref 18–29)
Calcium: 10 mg/dL (ref 8.7–10.2)
Chloride: 98 mmol/L (ref 96–106)
Creatinine, Ser: 1 mg/dL (ref 0.76–1.27)
GFR calc Af Amer: 98 mL/min/{1.73_m2}
GFR calc non Af Amer: 85 mL/min/{1.73_m2}
Globulin, Total: 2.8 g/dL (ref 1.5–4.5)
Glucose: 82 mg/dL (ref 65–99)
Potassium: 4.4 mmol/L (ref 3.5–5.2)
Sodium: 140 mmol/L (ref 134–144)
Total Protein: 7.6 g/dL (ref 6.0–8.5)

## 2017-05-11 LAB — CBC WITH DIFFERENTIAL/PLATELET
Basophils Absolute: 0 10*3/uL (ref 0.0–0.2)
Basos: 0 %
EOS (ABSOLUTE): 0 10*3/uL (ref 0.0–0.4)
Eos: 0 %
Hematocrit: 50.1 % (ref 37.5–51.0)
Hemoglobin: 17.3 g/dL (ref 13.0–17.7)
Immature Grans (Abs): 0 10*3/uL (ref 0.0–0.1)
Immature Granulocytes: 0 %
Lymphocytes Absolute: 2.6 10*3/uL (ref 0.7–3.1)
Lymphs: 29 %
MCH: 33.6 pg — ABNORMAL HIGH (ref 26.6–33.0)
MCHC: 34.5 g/dL (ref 31.5–35.7)
MCV: 97 fL (ref 79–97)
Monocytes Absolute: 0.6 10*3/uL (ref 0.1–0.9)
Monocytes: 7 %
Neutrophils Absolute: 5.6 10*3/uL (ref 1.4–7.0)
Neutrophils: 64 %
Platelets: 287 10*3/uL (ref 150–379)
RBC: 5.15 x10E6/uL (ref 4.14–5.80)
RDW: 12.8 % (ref 12.3–15.4)
WBC: 8.9 10*3/uL (ref 3.4–10.8)

## 2017-05-11 MED ORDER — PANTOPRAZOLE SODIUM 40 MG PO TBEC: DELAYED_RELEASE_TABLET | ORAL | 3 refills | Status: DC

## 2017-05-11 NOTE — Patient Instructions (Signed)
Great to met you!  Try aleve 1-2 pills twice daily for 1 week Come back if your symptoms get worse Please come back if they are still lingering in 3-4 weeks.

## 2017-05-11 NOTE — Progress Notes (Signed)
   HPI  Patient presents today here with dizziness and cough.  Cough 2-3 weeks of intermittent cough, he has left-sided lower thoracic/chest cavity pain with cough. The headaches started after that.  Patient explains that approximately 3 or 4 times this week he has had a 30 minute episode of pain and dizziness in the following sequence. It begins with low back pain which travels up to his neck then spreads to a bandlike pattern headache and causes an unsteadiness type of dizziness. This is moderate in severity, 5/10, and lasts approximately 30 minutes of no aggravating or alleviating factors.  No dysarthria, numbness or to lung, weakness, difficulty thinking, or any other normality is.  He is not a smoker. His cholesterol was previously elevated.  He has GERD with chronic use of PPI, has daily symptoms when he tries to stop. Needs refills.  PMH: Smoking status noted ROS: Per HPI  Objective: BP 132/87   Pulse 90   Temp 99.1 F (37.3 C) (Oral)   Ht _0  (1.651 m)   Wt 187 lb 3.2 oz (84.9 kg)   BMI 31.15 kg/m  Gen: NAD, alert, cooperative with exam HEENT: NCAT, EOMI, PERRL CV: RRR, good S1/S2, no murmur Resp: CTABL, no wheezes, non-labored Ext: No edema, warm Neuro: Alert and oriented, strength 5/5 and sensation intact in all 4 extremities, 2+ patellar tendon reflexes bilaterally, cranial nerves II through XII intact  Assessment and plan:  # Muscle spasm Symptoms most consistent with muscle spasm, however ending in unsteadiness type dizziness is unusual. Reassurance provided Scheduled Aleve 1 pill twice daily 1 week Careful watchful waiting, low threshold for follow-up if symptoms progress or do not improve.  # GERD Refill PPI Has daily symptoms when he tries to stop.  Cough Unclear etiology, mild. With L back pain will eval with CXR    Orders Placed This Encounter  Procedures  . DG Chest 2 View    Standing Status:   Future    Standing Expiration Date:    07/11/2018    Order Specific Question:   Reason for Exam (SYMPTOM  OR DIAGNOSIS REQUIRED)    Answer:   cough, L thoracic back pain    Order Specific Question:   Preferred imaging location?    Answer:   Internal    Order Specific Question:   Radiology Contrast Protocol - do NOT remove file path    Answer:   [email or web address]  . CMP14+EGFR  . CBC with Differential/Platelet    Meds ordered this encounter  Medications  . pantoprazole (PROTONIX) 40 MG tablet    Sig: TAKE ONE (1) TABLET EACH DAY    Dispense:  90 tablet    Refill:  3    Must be seen before next refill    Laroy Apple, MD [facility] 05/11/2017, 8:16 AM

## 2017-11-03 DIAGNOSIS — Z6831 Body mass index (BMI) 31.0-31.9, adult: Secondary | ICD-10-CM | POA: Diagnosis not present

## 2017-11-03 DIAGNOSIS — J40 Bronchitis, not specified as acute or chronic: Secondary | ICD-10-CM | POA: Diagnosis not present

## 2017-11-03 DIAGNOSIS — R05 Cough: Secondary | ICD-10-CM | POA: Diagnosis not present

## 2017-12-21 ENCOUNTER — Encounter: Payer: Self-pay | Admitting: Family Medicine

## 2017-12-21 ENCOUNTER — Ambulatory Visit (INDEPENDENT_AMBULATORY_CARE_PROVIDER_SITE_OTHER): Payer: BLUE CROSS/BLUE SHIELD | Admitting: Family Medicine

## 2017-12-21 VITALS — BP 139/82 | HR 85 | Temp 99.1°F | Ht 65.0 in | Wt 188.0 lb

## 2017-12-21 DIAGNOSIS — Z125 Encounter for screening for malignant neoplasm of prostate: Secondary | ICD-10-CM | POA: Diagnosis not present

## 2017-12-21 DIAGNOSIS — Z0000 Encounter for general adult medical examination without abnormal findings: Secondary | ICD-10-CM | POA: Diagnosis not present

## 2017-12-21 DIAGNOSIS — K219 Gastro-esophageal reflux disease without esophagitis: Secondary | ICD-10-CM

## 2017-12-21 MED ORDER — PANTOPRAZOLE SODIUM 40 MG PO TBEC: DELAYED_RELEASE_TABLET | ORAL | 3 refills | Status: DC

## 2017-12-21 NOTE — Progress Notes (Signed)
BP 139/82   Pulse 85   Temp 99.1 F (37.3 C) (Oral)   Ht _0  (1.651 m)   Wt 188 lb (85.3 kg)   BMI 31.28 kg/m    Subjective:    Patient ID: [name], male    DOB: 1961-03-20, 57 y.o.   MRN: [id number]  HPI: [name] is a 57 y.o. male presenting on 12/21/2017 for Annual Exam   HPI Adult well exam and physical Patient is coming in for adult well exam and physical and refill on his acid reflux medication.  He says he is doing very well on his medication and denies any major issues.  He says the acid reflux medication is working well for him.  He does have a chronic cough from working at the [employer] but he does try to wear a mask as prevention but he still gets a chronic cough from there.  He denies any shortness of breath or wheezing. Patient denies any chest pain, shortness of breath, headaches or vision issues, abdominal complaints, diarrhea, nausea, vomiting, or joint issues.   Relevant past medical, surgical, family and social history reviewed and updated as indicated. Interim medical history since our last visit reviewed. Allergies and medications reviewed and updated.  Review of Systems  Constitutional: Negative for chills and fever.  HENT: Negative for ear pain and tinnitus.   Eyes: Negative for pain and discharge.  Respiratory: Positive for cough. Negative for shortness of breath and wheezing.   Cardiovascular: Negative for chest pain, palpitations and leg swelling.  Gastrointestinal: Negative for abdominal pain, blood in stool, constipation and diarrhea.  Genitourinary: Negative for dysuria and hematuria.  Musculoskeletal: Negative for back pain, gait problem and myalgias.  Skin: Negative for rash.  Neurological: Negative for dizziness, weakness and headaches.  Psychiatric/Behavioral: Negative for suicidal ideas.  All other systems reviewed and are negative.   Per HPI unless specifically indicated above      Objective:    BP 139/82   Pulse 85    Temp 99.1 F (37.3 C) (Oral)   Ht _1  (1.651 m)   Wt 188 lb (85.3 kg)   BMI 31.28 kg/m   Wt Readings from Last 3 Encounters:  12/21/17 188 lb (85.3 kg)  05/11/17 187 lb 3.2 oz (84.9 kg)  10/04/16 194 lb (88 kg)    Physical Exam  Constitutional: He is oriented to person, place, and time. He appears well-developed and well-nourished. No distress.  HENT:  Right Ear: External ear normal.  Left Ear: External ear normal.  Nose: Nose normal.  Mouth/Throat: Oropharynx is clear and moist. No oropharyngeal exudate.  Eyes: Conjunctivae are normal. No scleral icterus.  Neck: Neck supple. No thyromegaly present.  Cardiovascular: Normal rate, regular rhythm, normal heart sounds and intact distal pulses.  No murmur heard. Pulmonary/Chest: Effort normal and breath sounds normal. No respiratory distress. He has no wheezes.  Abdominal: Soft. Bowel sounds are normal. He exhibits no distension. There is no tenderness. There is no rebound and no guarding.  Musculoskeletal: Normal range of motion. He exhibits no edema.  Lymphadenopathy:    He has no cervical adenopathy.  Neurological: He is alert and oriented to person, place, and time. Coordination normal.  Skin: Skin is warm and dry. No rash noted. He is not diaphoretic.  Psychiatric: He has a normal mood and affect. His behavior is normal.  Vitals reviewed.       Assessment & Plan:   Problem List Items Addressed This  Visit      Digestive   Esophageal reflux   Relevant Medications   pantoprazole (PROTONIX) 40 MG tablet   Other Relevant Orders   CBC with Differential/Platelet    Other Visit Diagnoses    Well adult exam    -  Primary   Relevant Orders   CMP14+EGFR   Lipid panel   Prostate cancer screening       Relevant Orders   PSA, total and free       Follow up plan: Return in about 1 year (around 12/21/2018), or if symptoms worsen or fail to improve.  Counseling provided for all of the vaccine components Orders Placed  This Encounter  Procedures  . CMP14+EGFR  . Lipid panel  . CBC with Differential/Platelet    Caryl Pina, MD [facility] 12/21/2017, 2:29 PM

## 2017-12-22 LAB — CMP14+EGFR
ALBUMIN: 4.7 g/dL (ref 3.5–5.5)
ALK PHOS: 80 IU/L (ref 39–117)
ALT: 35 IU/L (ref 0–44)
AST: 27 IU/L (ref 0–40)
Albumin/Globulin Ratio: 1.8 (ref 1.2–2.2)
BILIRUBIN TOTAL: 0.7 mg/dL (ref 0.0–1.2)
BUN / CREAT RATIO: 11 (ref 9–20)
BUN: 12 mg/dL (ref 6–24)
CHLORIDE: 102 mmol/L (ref 96–106)
CO2: 21 mmol/L (ref 20–29)
CREATININE: 1.05 mg/dL (ref 0.76–1.27)
Calcium: 9.4 mg/dL (ref 8.7–10.2)
GFR calc Af Amer: 93 mL/min/{1.73_m2} (ref >59)
GFR calc non Af Amer: 80 mL/min/{1.73_m2} (ref >59)
GLOBULIN, TOTAL: 2.6 g/dL (ref 1.5–4.5)
Glucose: 105 mg/dL — ABNORMAL HIGH (ref 65–99)
Potassium: 3.7 mmol/L (ref 3.5–5.2)
SODIUM: 144 mmol/L (ref 134–144)
Total Protein: 7.3 g/dL (ref 6.0–8.5)

## 2017-12-22 LAB — CBC WITH DIFFERENTIAL/PLATELET
BASOS: 0 %
Basophils Absolute: 0 10*3/uL (ref 0.0–0.2)
EOS (ABSOLUTE): 0.1 10*3/uL (ref 0.0–0.4)
Eos: 1 %
Hematocrit: 49.3 % (ref 37.5–51.0)
Hemoglobin: 17.4 g/dL (ref 13.0–17.7)
IMMATURE GRANS (ABS): 0 10*3/uL (ref 0.0–0.1)
Immature Granulocytes: 0 %
LYMPHS: 24 %
Lymphocytes Absolute: 2.1 10*3/uL (ref 0.7–3.1)
MCH: 33.7 pg — ABNORMAL HIGH (ref 26.6–33.0)
MCHC: 35.3 g/dL (ref 31.5–35.7)
MCV: 95 fL (ref 79–97)
Monocytes Absolute: 0.6 10*3/uL (ref 0.1–0.9)
Monocytes: 7 %
NEUTROS PCT: 68 %
Neutrophils Absolute: 6.2 10*3/uL (ref 1.4–7.0)
PLATELETS: 298 10*3/uL (ref 150–379)
RBC: 5.17 x10E6/uL (ref 4.14–5.80)
RDW: 13.6 % (ref 12.3–15.4)
WBC: 9 10*3/uL (ref 3.4–10.8)

## 2017-12-22 LAB — LIPID PANEL
CHOLESTEROL TOTAL: 203 mg/dL — AB (ref 100–199)
Chol/HDL Ratio: 5.1 ratio — ABNORMAL HIGH (ref 0.0–5.0)
HDL: 40 mg/dL (ref >39)
LDL CALC: 130 mg/dL — AB (ref 0–99)
Triglycerides: 167 mg/dL — ABNORMAL HIGH (ref 0–149)
VLDL Cholesterol Cal: 33 mg/dL (ref 5–40)

## 2017-12-22 LAB — PSA, TOTAL AND FREE
PROSTATE SPECIFIC AG, SERUM: 0.7 ng/mL (ref 0.0–4.0)
PSA FREE: 0.22 ng/mL
PSA, Free Pct: 31.4 %

## 2017-12-24 LAB — SPECIMEN STATUS REPORT

## 2017-12-24 LAB — HGB A1C W/O EAG: Hgb A1c MFr Bld: 5.4 % (ref 4.8–5.6)

## 2018-02-01 ENCOUNTER — Encounter: Payer: Self-pay | Admitting: Family Medicine

## 2018-02-01 ENCOUNTER — Ambulatory Visit: Payer: BLUE CROSS/BLUE SHIELD | Admitting: Family Medicine

## 2018-02-01 VITALS — BP 131/81 | HR 100 | Temp 101.3°F | Ht 65.0 in | Wt 188.0 lb

## 2018-02-01 DIAGNOSIS — R6889 Other general symptoms and signs: Secondary | ICD-10-CM

## 2018-02-01 DIAGNOSIS — J40 Bronchitis, not specified as acute or chronic: Secondary | ICD-10-CM | POA: Diagnosis not present

## 2018-02-01 LAB — VERITOR FLU A/B WAIVED
INFLUENZA B: NEGATIVE
Influenza A: NEGATIVE

## 2018-02-01 MED ORDER — ALBUTEROL SULFATE HFA 108 (90 BASE) MCG/ACT IN AERS: 2.0000 | INHALATION_SPRAY | Freq: Four times a day (QID) | RESPIRATORY_TRACT | 5 refills | Status: DC | PRN

## 2018-02-01 MED ORDER — OSELTAMIVIR PHOSPHATE 75 MG PO CAPS: 75.0000 mg | ORAL_CAPSULE | Freq: Two times a day (BID) | ORAL | 0 refills | Status: DC

## 2018-02-01 MED ORDER — PREDNISONE 20 MG PO TABS
ORAL_TABLET | ORAL | 0 refills | Status: DC
Start: 2018-02-01 — End: 2018-10-12

## 2018-02-01 NOTE — Progress Notes (Signed)
BP 131/81   Pulse 100   Temp (!) 101.3 F (38.5 C) (Oral)   Ht 5\' 5"  (1.651 m)   Wt 188 lb (85.3 kg)   SpO2 95%   BMI 31.28 kg/m    Subjective:    Patient ID: [name], male    DOB: 1961/11/24, 57 y.o.   MRN: [id number]  HPI: TORIAN QUINTERO is a 57 y.o. male presenting on 02/01/2018 for Cough, wheezing, fever, chills (began last night, severe; out of Albuterol inhaler)   HPI Pt presents with cough, wheezing, body aches, and feeling feverish since last night. He has also had nausea and post-tussive emesis several times after coughing spells. Today his fever is 101.76F. He has been taking his albuterol inhaler q4hrs since last night for the wheezing, but ran out this morning. He was first prescribed this about 4 years ago when he started getting bronchitis regularly. States he gets it about 2x/year-- when the seasons change usually. Last time he had bronchitis was October 2018. He works at a [employer] and is exposed to irritants regularly. He does not smoke, does not have asthma and says he used to have allergies but does not know if this is still an issue for him any longer. He is not taking any other medications for this illness. States his wife was sick with the flu about 2 weeks ago, but no one else has been sick that he knows of.   Relevant past medical, surgical, family and social history reviewed and updated as indicated. Interim medical history since our last visit reviewed. Allergies and medications reviewed and updated.  Review of Systems  Constitutional: Positive for chills, diaphoresis, fatigue and fever.  HENT: Positive for congestion and tinnitus. Negative for ear discharge and ear pain.   Eyes: Negative for discharge and itching.  Respiratory: Positive for cough, chest tightness (feels like he can't cough the sputum up) and wheezing. Negative for shortness of breath.   Gastrointestinal: Positive for nausea and vomiting (post tussive emesis). Negative for abdominal  pain and diarrhea.  Skin: Negative for pallor and rash.    Per HPI unless specifically indicated above   Allergies as of 02/01/2018      Reactions   Ibuprofen Swelling   Morphine And Related Swelling   Bee Venom Rash      Medication List        Accurate as of 02/01/18  8:32 AM. Always use your most recent med list.          albuterol 108 (90 Base) MCG/ACT inhaler Commonly known as:  PROAIR HFA Inhale 2 puffs into the lungs every 6 (six) hours as needed for wheezing or shortness of breath.   oseltamivir 75 MG capsule Commonly known as:  TAMIFLU Take 1 capsule (75 mg total) by mouth 2 (two) times daily.   pantoprazole 40 MG tablet Commonly known as:  PROTONIX TAKE ONE (1) TABLET EACH DAY   predniSONE 20 MG tablet Commonly known as:  DELTASONE 2 po at same time daily for 5 days          Objective:    BP 131/81   Pulse 100   Temp (!) 101.3 F (38.5 C) (Oral)   Ht 5\' 5"  (1.651 m)   Wt 188 lb (85.3 kg)   SpO2 95%   BMI 31.28 kg/m   Wt Readings from Last 3 Encounters:  02/01/18 188 lb (85.3 kg)  12/21/17 188 lb (85.3 kg)  05/11/17 187 lb 3.2  oz (84.9 kg)    Physical Exam  Constitutional: He is oriented to person, place, and time. He appears well-developed and well-nourished.  HENT:  Right Ear: Hearing, tympanic membrane, external ear and ear canal normal.  Left Ear: Hearing, tympanic membrane, external ear and ear canal normal.  Nose: Nose normal.  Mouth/Throat: Mucous membranes are normal. Posterior oropharyngeal edema and posterior oropharyngeal erythema present. No oropharyngeal exudate or tonsillar abscesses.  Cardiovascular: Normal rate, regular rhythm and normal heart sounds.  Pulmonary/Chest: Effort normal and breath sounds normal. No respiratory distress. He has no wheezes. He has no rales.  Abdominal: Soft. He exhibits no mass. There is no tenderness.  Neurological: He is alert and oriented to person, place, and time.  Nursing note and vitals  reviewed.   Influenza test negative    Assessment & Plan:   Problem List Items Addressed This Visit    None    Visit Diagnoses    Flu-like symptoms    -  Primary   Relevant Medications   albuterol (PROAIR HFA) 108 (90 Base) MCG/ACT inhaler   predniSONE (DELTASONE) 20 MG tablet   oseltamivir (TAMIFLU) 75 MG capsule   Other Relevant Orders   Veritor Flu A/B Waived   Bronchitis       Relevant Medications   albuterol (PROAIR HFA) 108 (90 Base) MCG/ACT inhaler   predniSONE (DELTASONE) 20 MG tablet   oseltamivir (TAMIFLU) 75 MG capsule      Order albuterol and prednisone for wheezing. Order Tamiflu for flu-like symptoms.  Instruct pt to take Tylenol for fever. May use Flonase and Mucinex to treat symptoms.  Encourage pt to remain well hydrated and rest. Instruct pt to stay off work through Friday and until afebrile for 24 hours.  Follow up plan: Return if symptoms worsen or fail to improve.  Counseling provided for all of the vaccine components Orders Placed This Encounter  Procedures  . Veritor Flu A/B Waived   Patient seen and examined with Chaney Malling PA student, agree with assessment and plan above. Caryl Pina, MD [facility] 02/01/2018, 9:27 AM

## 2018-10-12 ENCOUNTER — Ambulatory Visit (INDEPENDENT_AMBULATORY_CARE_PROVIDER_SITE_OTHER): Payer: BLUE CROSS/BLUE SHIELD

## 2018-10-12 ENCOUNTER — Encounter: Payer: Self-pay | Admitting: Family

## 2018-10-12 ENCOUNTER — Ambulatory Visit: Payer: BLUE CROSS/BLUE SHIELD | Admitting: Family

## 2018-10-12 VITALS — BP 119/77 | HR 81 | Temp 97.6°F | Ht 65.0 in | Wt 189.0 lb

## 2018-10-12 DIAGNOSIS — R059 Cough, unspecified: Secondary | ICD-10-CM

## 2018-10-12 DIAGNOSIS — R05 Cough: Secondary | ICD-10-CM

## 2018-10-12 DIAGNOSIS — R0602 Shortness of breath: Secondary | ICD-10-CM | POA: Diagnosis not present

## 2018-10-12 DIAGNOSIS — J40 Bronchitis, not specified as acute or chronic: Secondary | ICD-10-CM

## 2018-10-12 DIAGNOSIS — Z579 Occupational exposure to unspecified risk factor: Secondary | ICD-10-CM

## 2018-10-12 MED ORDER — CETIRIZINE HCL 10 MG PO TABS: 10.0000 mg | ORAL_TABLET | Freq: Every day | ORAL | 11 refills | Status: DC

## 2018-10-12 MED ORDER — PREDNISONE 10 MG (21) PO TBPK: ORAL_TABLET | ORAL | 0 refills | Status: DC

## 2018-10-12 NOTE — Progress Notes (Signed)
Subjective:    Patient ID: [name], male    DOB: 1961/04/01, 57 y.o.   MRN: [id number]  Chief Complaint  Patient presents with  . Cough    trouble breathing   PT presents to the office today for cough. States he has worked in a [employer] for the last 15 year.  Cough  This is a recurrent problem. The current episode started 1 to 4 weeks ago. The problem has been waxing and waning. The problem occurs every few minutes. The cough is non-productive. Associated symptoms include chills, ear congestion, ear pain, a fever, headaches, myalgias, postnasal drip, rhinorrhea, shortness of breath and wheezing. The symptoms are aggravated by pollens. Risk factors for lung disease include occupational exposure. He has tried rest for the symptoms. The treatment provided mild relief. His past medical history is significant for environmental allergies.      Review of Systems  Constitutional: Positive for chills and fever.  HENT: Positive for ear pain, postnasal drip and rhinorrhea.   Respiratory: Positive for cough, shortness of breath and wheezing.   Musculoskeletal: Positive for myalgias.  Allergic/Immunologic: Positive for environmental allergies.  Neurological: Positive for headaches.  All other systems reviewed and are negative.      Objective:   Physical Exam  Constitutional: He is oriented to person, place, and time. He appears well-developed and well-nourished. No distress.  HENT:  Head: Normocephalic.  Right Ear: External ear normal.  Left Ear: External ear normal.  Mouth/Throat: Oropharynx is clear and moist.  Eyes: Pupils are equal, round, and reactive to light. Right eye exhibits no discharge. Left eye exhibits no discharge.  Neck: Normal range of motion. Neck supple. No thyromegaly present.  Cardiovascular: Normal rate, regular rhythm, normal heart sounds and intact distal pulses.  No murmur heard. Pulmonary/Chest: Effort normal and breath sounds normal. No respiratory  distress. He has no wheezes.  Abdominal: Soft. Bowel sounds are normal. He exhibits no distension. There is no tenderness.  Musculoskeletal: Normal range of motion. He exhibits no edema or tenderness.  Neurological: He is alert and oriented to person, place, and time. He has normal reflexes. No cranial nerve deficit.  Skin: Skin is warm and dry. No rash noted. No erythema.  Psychiatric: He has a normal mood and affect. His behavior is normal. Judgment and thought content normal.  Vitals reviewed.   BP 119/77   Pulse 81   Temp 97.6 F (36.4 C) (Oral)   Ht 5\' 5"  (1.651 m)   Wt 189 lb (85.7 kg)   SpO2 98%   BMI 31.45 kg/m      Assessment & Plan:  [name] comes in today with chief complaint of Cough (trouble breathing)   Diagnosis and orders addressed:  1. Cough - cetirizine (ZYRTEC) 10 MG tablet; Take 1 tablet (10 mg total) by mouth daily.  Dispense: 30 tablet; Refill: 11 - DG Chest 2 View; Future  2. Bronchitis - Take meds as prescribed - Use a cool mist humidifier  -Use saline nose sprays frequently -Force fluids -For any cough or congestion  Use plain Mucinex- regular strength or max strength is fine -For fever or aces or pains- take tylenol or ibuprofen. -Throat lozenges if help -RTO if symptoms worsen or do not improve - cetirizine (ZYRTEC) 10 MG tablet; Take 1 tablet (10 mg total) by mouth daily.  Dispense: 30 tablet; Refill: 11 - predniSONE (STERAPRED UNI-PAK 21 TAB) 10 MG (21) TBPK tablet; Use as directed  Dispense: 21 tablet;  Refill: 0 - DG Chest 2 View; Future  3. Occupational exposure in workplace - DG Chest 2 View; Future     Evelina Dun, FNP

## 2018-10-12 NOTE — Patient Instructions (Signed)

## 2018-11-08 ENCOUNTER — Encounter: Payer: Self-pay | Admitting: Family Medicine

## 2018-11-08 ENCOUNTER — Ambulatory Visit: Payer: BLUE CROSS/BLUE SHIELD | Admitting: Family Medicine

## 2018-11-08 ENCOUNTER — Other Ambulatory Visit: Payer: Self-pay | Admitting: Family Medicine

## 2018-11-08 VITALS — BP 121/79 | HR 72 | Temp 97.4°F | Ht 65.0 in | Wt 191.4 lb

## 2018-11-08 DIAGNOSIS — L989 Disorder of the skin and subcutaneous tissue, unspecified: Secondary | ICD-10-CM

## 2018-11-08 DIAGNOSIS — C44619 Basal cell carcinoma of skin of left upper limb, including shoulder: Secondary | ICD-10-CM | POA: Diagnosis not present

## 2018-11-08 NOTE — Progress Notes (Signed)
BP 121/79   Pulse 72   Temp (!) 97.4 F (36.3 C) (Oral)   Ht 5\' 5"  (1.651 m)   Wt 191 lb 6.4 oz (86.8 kg)   BMI 31.85 kg/m    Subjective:    Patient ID: [name], male    DOB: 12-09-1960, 57 y.o.   MRN: [id number]  HPI: [name] is a 57 y.o. male presenting on 11/08/2018 for scab (Left arm x 6 months. Patient states that it was started as a scratch and now a scab that will not go away.)   HPI Left arm nonhealing skin lesion that is growing Patient has a nonhealing skin lesion on his left upper arm anteriorly that has been increasing in size and started as a small scratch but now it is a large scab and it just will not go away.  He has been covering it and moisturizing it and it still seems to be getting larger on him.  He denies any similar lesions anywhere else.  He denies any pain or drainage with it.  He denies any redness or warmth.  Relevant past medical, surgical, family and social history reviewed and updated as indicated. Interim medical history since our last visit reviewed. Allergies and medications reviewed and updated.  Review of Systems  Constitutional: Negative for chills and fever.  Eyes: Negative for visual disturbance.  Respiratory: Negative for shortness of breath and wheezing.   Cardiovascular: Negative for chest pain and leg swelling.  Musculoskeletal: Negative for back pain and gait problem.  Skin: Negative for color change and rash.  All other systems reviewed and are negative.   Per HPI unless specifically indicated above   Allergies as of 11/08/2018      Reactions   Ibuprofen Swelling   Morphine And Related Swelling   Bee Venom Rash      Medication List        Accurate as of 11/08/18  3:44 PM. Always use your most recent med list.          albuterol 108 (90 Base) MCG/ACT inhaler Commonly known as:  PROVENTIL HFA;VENTOLIN HFA Inhale 2 puffs into the lungs every 6 (six) hours as needed for wheezing or shortness of breath.     cetirizine 10 MG tablet Commonly known as:  ZYRTEC Take 1 tablet (10 mg total) by mouth daily.   pantoprazole 40 MG tablet Commonly known as:  PROTONIX TAKE ONE (1) TABLET EACH DAY          Objective:    BP 121/79   Pulse 72   Temp (!) 97.4 F (36.3 C) (Oral)   Ht 5\' 5"  (1.651 m)   Wt 191 lb 6.4 oz (86.8 kg)   BMI 31.85 kg/m   Wt Readings from Last 3 Encounters:  11/08/18 191 lb 6.4 oz (86.8 kg)  10/12/18 189 lb (85.7 kg)  02/01/18 188 lb (85.3 kg)    Physical Exam  Constitutional: He is oriented to person, place, and time. He appears well-developed and well-nourished. No distress.  Eyes: Pupils are equal, round, and reactive to light. Conjunctivae are normal. No scleral icterus.  Neurological: He is alert and oriented to person, place, and time.  Skin: Skin is warm and dry. He is not diaphoretic.     Psychiatric: He has a normal mood and affect. His behavior is normal.  Nursing note and vitals reviewed.   Skin lesion removal: Verbal consent was obtained.  Betadine was used for cleansing.  3cc of  2% lidocaine without epinephrine was used for anesthesia.  Shave biopsy was performed with good margins.  Silver nitrate was used to achieve hemostasis.  Topical antibiotic was used and then it was covered by 3x 3 and pressure dressing in place. Procedure was tolerated well.  Bleeding was minimal   Pressure dressing was placed and patient was instructed to keep on for at least 8 hours and then may remove and use simple antibiotic ointment and a bandage    Assessment & Plan:   Problem List Items Addressed This Visit    None    Visit Diagnoses    Non-healing skin lesion    -  Primary   Left upper arm, concern for possible squamous cell   Relevant Orders   Pathology      Will send for pathology Follow up plan: Return if symptoms worsen or fail to improve.  Counseling provided for all of the vaccine components No orders of the defined types were placed in this  encounter.   Caryl Pina, MD [facility] 11/08/2018, 3:44 PM

## 2018-11-10 LAB — PATHOLOGY

## 2018-11-14 ENCOUNTER — Telehealth: Payer: Self-pay | Admitting: Family Medicine

## 2018-11-14 DIAGNOSIS — C44619 Basal cell carcinoma of skin of left upper limb, including shoulder: Secondary | ICD-10-CM

## 2018-11-14 NOTE — Telephone Encounter (Signed)
Spoke with patient about results of basal cell carcinoma and have referred him to dermatology.

## 2018-11-16 DIAGNOSIS — S86312A Strain of muscle(s) and tendon(s) of peroneal muscle group at lower leg level, left leg, initial encounter: Secondary | ICD-10-CM | POA: Diagnosis not present

## 2018-11-16 DIAGNOSIS — S9305XA Dislocation of left ankle joint, initial encounter: Secondary | ICD-10-CM | POA: Diagnosis not present

## 2018-11-16 DIAGNOSIS — S92062A Displaced intraarticular fracture of left calcaneus, initial encounter for closed fracture: Secondary | ICD-10-CM | POA: Diagnosis not present

## 2018-11-18 ENCOUNTER — Ambulatory Visit: Payer: BLUE CROSS/BLUE SHIELD | Admitting: Family

## 2018-11-18 ENCOUNTER — Emergency Department (HOSPITAL_COMMUNITY)
Admission: EM | Admit: 2018-11-18 | Discharge: 2018-11-18 | Disposition: A | Discharge status: Home / Self Care | Payer: BLUE CROSS/BLUE SHIELD | Attending: Emergency Medicine | Admitting: Emergency Medicine

## 2018-11-18 ENCOUNTER — Emergency Department (HOSPITAL_COMMUNITY): Payer: BLUE CROSS/BLUE SHIELD

## 2018-11-18 ENCOUNTER — Encounter (HOSPITAL_COMMUNITY): Payer: Self-pay | Admitting: Emergency Medicine

## 2018-11-18 ENCOUNTER — Other Ambulatory Visit: Payer: Self-pay

## 2018-11-18 DIAGNOSIS — S92002A Unspecified fracture of left calcaneus, initial encounter for closed fracture: Secondary | ICD-10-CM | POA: Insufficient documentation

## 2018-11-18 DIAGNOSIS — S59902A Unspecified injury of left elbow, initial encounter: Secondary | ICD-10-CM | POA: Diagnosis not present

## 2018-11-18 DIAGNOSIS — S99912A Unspecified injury of left ankle, initial encounter: Secondary | ICD-10-CM | POA: Diagnosis present

## 2018-11-18 DIAGNOSIS — Y929 Unspecified place or not applicable: Secondary | ICD-10-CM | POA: Diagnosis not present

## 2018-11-18 DIAGNOSIS — S3992XA Unspecified injury of lower back, initial encounter: Secondary | ICD-10-CM | POA: Diagnosis not present

## 2018-11-18 DIAGNOSIS — M25522 Pain in left elbow: Secondary | ICD-10-CM | POA: Diagnosis not present

## 2018-11-18 DIAGNOSIS — Y998 Other external cause status: Secondary | ICD-10-CM | POA: Insufficient documentation

## 2018-11-18 DIAGNOSIS — S4992XA Unspecified injury of left shoulder and upper arm, initial encounter: Secondary | ICD-10-CM | POA: Diagnosis not present

## 2018-11-18 DIAGNOSIS — S82832A Other fracture of upper and lower end of left fibula, initial encounter for closed fracture: Secondary | ICD-10-CM | POA: Diagnosis not present

## 2018-11-18 DIAGNOSIS — M545 Low back pain: Secondary | ICD-10-CM | POA: Diagnosis not present

## 2018-11-18 DIAGNOSIS — W19XXXA Unspecified fall, initial encounter: Secondary | ICD-10-CM

## 2018-11-18 DIAGNOSIS — W11XXXA Fall on and from ladder, initial encounter: Secondary | ICD-10-CM | POA: Diagnosis not present

## 2018-11-18 DIAGNOSIS — Y939 Activity, unspecified: Secondary | ICD-10-CM | POA: Diagnosis not present

## 2018-11-18 DIAGNOSIS — Z79899 Other long term (current) drug therapy: Secondary | ICD-10-CM | POA: Diagnosis not present

## 2018-11-18 DIAGNOSIS — M25512 Pain in left shoulder: Secondary | ICD-10-CM | POA: Diagnosis not present

## 2018-11-18 MED ORDER — OXYCODONE-ACETAMINOPHEN 5-325 MG PO TABS: ORAL_TABLET | ORAL | 0 refills | Status: DC

## 2018-11-18 MED ORDER — OXYCODONE-ACETAMINOPHEN 5-325 MG PO TABS
2.0000 | ORAL_TABLET | Freq: Once | ORAL | Status: AC
  Administered 2018-11-18: 2 via ORAL
  Filled 2018-11-18: qty 2

## 2018-11-18 MED ORDER — FENTANYL CITRATE (PF) 100 MCG/2ML IJ SOLN
50.0000 ug | Freq: Once | INTRAMUSCULAR | Status: AC
  Administered 2018-11-18: 50 ug via INTRAMUSCULAR
  Filled 2018-11-18: qty 2

## 2018-11-18 NOTE — ED Triage Notes (Signed)
Patient reports falling off 10 foot ladder approx 1.5 hours ago. Denies hitting head or LOC. Denies taking any type of anticoagulants. Patient c/o left shoulder and left ankle pain.

## 2018-11-18 NOTE — ED Notes (Signed)
Pt request pain meds   Dr Tonye Becket informed

## 2018-11-18 NOTE — ED Notes (Addendum)
in CT

## 2018-11-18 NOTE — ED Notes (Signed)
From Rad 

## 2018-11-18 NOTE — Discharge Instructions (Addendum)
Your CT showed a compression fracture of the left calcaneus. Take the prescription as directed.  Use the crutches and wear the splint until you are seen in follow up. Apply ice to the area(s) of discomfort, for 15 minutes at a time, several times per day for the next few days.  Do not fall asleep on an ice pack.  Call the Orthopedic doctor on Monday to schedule a follow up appointment in the next 3 days.  Return to the Emergency Department immediately if worsening.

## 2018-11-18 NOTE — ED Notes (Signed)
To CT

## 2018-11-18 NOTE — ED Provider Notes (Signed)
[facility] EMERGENCY DEPARTMENT Provider Note   CSN: [id number] Arrival date & time: 11/18/18  1107     History   Chief Complaint Chief Complaint  Patient presents with  . Fall    HPI [name] is a 57 y.o. male.  HPI  Pt was seen at 1210. Per pt, c/o sudden onset and resolution of one episode of fall that occurred approximately 0930 PTA. Pt states he was up on a 10 foot ladder when it slid out from underneath him. Pt states he fell to the ground, landing on his feet, especially left foot, then falling to his left side. Pt c/o left ankle and shoulder pain. Pt was unable to stand/weight bear on his LLE due to the pain in his ankle. Denies hitting head, no LOC, no AMS, no neck or back pain, no CP/SOB, no abd pain, no N/V/D, no focal motor weakness, no tingling/numbness in extremities.    Past Medical History:  Diagnosis Date  . Allergy   . Arthritis   . GERD (gastroesophageal reflux disease)   . Small bowel obstruction [facility])     Patient Active Problem List   Diagnosis Date Noted  . Small bowel obstruction ([place]) 10/29/2014  . Esophageal reflux 10/29/2014  . Primary osteoarthritis of right knee 10/22/2014  . Inguinal hernia 11/23/2013    Past Surgical History:  Procedure Laterality Date  . HAND SURGERY Right    contracture of hand  . INGUINAL HERNIA REPAIR Right 11/23/2013   Procedure: HERNIA REPAIR INGUINAL ADULT;  Surgeon: Scherry Ran, MD;  Location: [facility];  Service: General;  Laterality: Right;  site-inguinal area  . VASECTOMY    . WRIST SURGERY Right    otif        Home Medications    Prior to Admission medications   Medication Sig Start Date End Date Taking? Authorizing Provider  albuterol (PROAIR HFA) 108 (90 Base) MCG/ACT inhaler Inhale 2 puffs into the lungs every 6 (six) hours as needed for wheezing or shortness of breath. 02/01/18  Yes Dettinger, [name], MD  cetirizine (ZYRTEC) 10 MG tablet Take 1 tablet (10 mg total) by mouth daily.  10/12/18  Yes Hawks, Alyse Low A, FNP  pantoprazole (PROTONIX) 40 MG tablet TAKE ONE (1) TABLET EACH DAY 12/21/17  Yes Dettinger, [name], MD    Family History Family History  Problem Relation Age of Onset  . Stroke Mother   . Cancer Mother        colon  . Alzheimer's disease Father        45 years  . Heart disease Father        atrial fibri  . Heart disease Sister   . Early death Brother   . Cancer Maternal Grandmother     Social History Social History   Tobacco Use  . Smoking status: Never Smoker  . Smokeless tobacco: Never Used  Substance Use Topics  . Alcohol use: Yes    Alcohol/week: 4.0 standard drinks    Types: 4 Cans of beer per week    Comment: occ  . Drug use: No     Allergies   Ibuprofen; Morphine and related; and Bee venom   Review of Systems Review of Systems ROS: Statement: All systems negative except as marked or noted in the HPI; Constitutional: Negative for fever and chills. ; ; Eyes: Negative for eye pain, redness and discharge. ; ; ENMT: Negative for ear pain, hoarseness, nasal congestion, sinus pressure and sore throat. ; ;  Cardiovascular: Negative for chest pain, palpitations, diaphoresis, dyspnea and peripheral edema. ; ; Respiratory: Negative for cough, wheezing and stridor. ; ; Gastrointestinal: Negative for nausea, vomiting, diarrhea, abdominal pain, blood in stool, hematemesis, jaundice and rectal bleeding. . ; ; Genitourinary: Negative for dysuria, flank pain and hematuria. ; ; Musculoskeletal: +left shoulder and ankle pain. Negative for back pain and neck pain. Negative for deformity.; ; Skin: Negative for pruritus, rash, abrasions, blisters, bruising and skin lesion.; ; Neuro: Negative for headache, lightheadedness and neck stiffness. Negative for weakness, altered level of consciousness, altered mental status, extremity weakness, paresthesias, involuntary movement, seizure and syncope.       Physical Exam Updated Vital Signs BP (!) 141/87 (BP  Location: Right Arm)   Pulse 93   Temp 98.4 F (36.9 C) (Oral)   Resp 20   Ht 5\' 5"  (1.651 m)   Wt 86.2 kg   SpO2 95%   BMI 31.62 kg/m    BP 124/81   Pulse 83   Temp 98.4 F (36.9 C) (Oral)   Resp 20   Ht 5\' 5"  (1.651 m)   Wt 86.2 kg   SpO2 95%   BMI 31.62 kg/m    Physical Exam 1215: Physical examination: Vital signs and O2 SAT: Reviewed; Constitutional: Well developed, Well nourished, Well hydrated, In no acute distress; Head and Face: Normocephalic, Atraumatic; Eyes: EOMI, PERRL, No scleral icterus; ENMT: Mouth and pharynx normal, Left TM normal, Right TM normal, Mucous membranes moist; Neck: Supple, Trachea midline. No abrasions or ecchymosis.; Spine: No midline CS, TS, LS tenderness.; Cardiovascular: Regular rate and rhythm, No gallop; Respiratory: Breath sounds clear & equal bilaterally, No wheezes, Normal respiratory effort/excursion; Chest: Nontender, No deformity, Movement normal, No crepitus, No abrasions or ecchymosis.; Abdomen: Soft, Nontender, Nondistended, Normal bowel sounds, No abrasions or ecchymosis.; Genitourinary: No CVA tenderness;; Extremities: +tender to palp left lateral maleolar area w/localized edema, NMS intact left foot, strong pedal pp, LE muscle compartments soft.  No left proximal fibular head tenderness, no knee tenderness, no dorsal foot tenderness, no hip tenderness.  No deformity, no ecchymosis, no erythema, no open wounds.  +plantarflexion of left foot w/calf squeeze.  No palpable gap left Achilles's tendon.  Decreased ROM F/E d/t pain.  Left shoulder w/FROM.  NT to palp entire joint, AC joint, clavicle NT, scapula NT, biceps tendon NT over bicipital groove. +mild proximal humeral tenderness to palp. No deformity, no abrasions or ecchymosis, no edema.  Motor strength at shoulder normal.  Sensation intact over deltoid region, distal NMS intact with left hand having intact and equal sensation and strength in the distribution of the median, radial, and ulnar  nerve function compared to opposite side.  Strong radial pulse.  +FROM left elbow with intact motor strength biceps and triceps muscles to resistance.  NT right hip/knee/ankle/foot. Otherwise, full range of motion major/large joints of bilat UE's and LE's without pain or tenderness to palp, Neurovascularly intact, Pulses normal, No deformity. Pelvis stable; Neuro: AA&Ox3, GCS 15.  Major CN grossly intact. Speech clear. No gross focal motor or sensory deficits in extremities.; Skin: Color normal, Warm, Dry   ED Treatments / Results  Labs (all labs ordered are listed, but only abnormal results are displayed)   EKG None  Radiology   Procedures Procedures (including critical care time)  Medications Ordered in ED Medications - No data to display   Initial Impression / Assessment and Plan / ED Course  I have reviewed the triage vital signs and the nursing notes.  Pertinent labs & imaging results that were available during my care of the patient were reviewed by me and considered in my medical decision making (see chart for details).  MDM Reviewed: previous chart, nursing note and vitals Interpretation: x-ray and CT scan    Dg Lumbar Spine Complete Result Date: 11/18/2018 CLINICAL DATA:  Pain after fall EXAM: LUMBAR SPINE - COMPLETE 4+ VIEW COMPARISON:  CT scan November 06, 2014 FINDINGS: No inter pedicular widening. There is a compression fracture of L1 which is unchanged since December 2015 CT imaging. Irregularity of the in inferior endplate of M57 and minimal anterior wedging of T12 is also unchanged since 2015. No acute fracture. No traumatic malalignment. Mild multilevel degenerative disc disease. IMPRESSION: No acute fracture or traumatic malalignment. Remote compression fracture of L1. Electronically Signed   By: Dorise Bullion III M.D   On: 11/18/2018 13:53   Dg Elbow Complete Left Result Date: 11/18/2018 CLINICAL DATA:  Pain after fall EXAM: LEFT ELBOW - COMPLETE 3+ VIEW  COMPARISON:  None. FINDINGS: There is no evidence of fracture, dislocation, or joint effusion. There is no evidence of arthropathy or other focal bone abnormality. Soft tissues are unremarkable. IMPRESSION: Negative. Electronically Signed   By: Dorise Bullion III M.D   On: 11/18/2018 13:44   Dg Ankle Complete Left Result Date: 11/18/2018 CLINICAL DATA:  Pain after fall EXAM: LEFT ANKLE COMPLETE - 3+ VIEW COMPARISON:  None. FINDINGS: There is a comminuted fracture of the calcaneus. The talus is grossly intact. A soft tissue calcification adjacent to the distal fibula is of uncertain etiology. It is possible that arises from the fibula but no definitive donor site is noted. The distal tibia is intact. The ankle mortise is intact. No other acute abnormalities. IMPRESSION: 1. Comminuted mildly displaced calcaneal fracture. 2. Fracture fragment adjacent to the distal fibula without a definitive donor site identified. It is unclear whether this arises from the fibula, the calcaneus, or another bone. Recommend a CT scan of the ankle for further assessment. Electronically Signed   By: Dorise Bullion III M.D   On: 11/18/2018 13:50   Ct Ankle Left Wo Contrast Result Date: 11/18/2018 CLINICAL DATA:  Left ankle pain after falling from a ladder earlier today. EXAM: CT OF THE LEFT ANKLE WITHOUT CONTRAST TECHNIQUE: Multidetector CT imaging of the left ankle was performed according to the standard protocol. Multiplanar CT image reconstructions were also generated. COMPARISON:  Radiographs same date. FINDINGS: Bones/Joint/Cartilage Comminuted intra-articular compression fracture of the calcaneus is associated with depression of the posterior subtalar facet laterally by approximately 6 mm. There is a complex subtalar joint effusion. Fracture extends into the inferolateral aspect of the calcaneal cuboid articulation. There is mild flattening of Boehler's angle. No other tarsal bone fractures are identified. There is a small  avulsion fracture involving the lateral aspect of the distal fibula. There is no significant widening of the ankle mortise. The distal tibia appears intact. Ligaments Suboptimally assessed by CT. Muscles and Tendons The peroneal tendons are mildly subluxed laterally at the level of the distal fibula, suggesting that the lateral avulsion fracture is manifested by stripping of the peroneal retinaculum. No tendon entrapment or rupture identified. Soft tissues Predominately lateral soft tissue swelling at the ankle and hindfoot without focal fluid collection. IMPRESSION: 1. Comminuted intra-articular centrolateral type compression fracture of the calcaneus as described with depression of the posterior subtalar facet. 2. Avulsion fracture of the distal fibula laterally, likely mediated by stripping of the peroneal retinaculum. 3. No other tarsal  bone fractures identified. 4. No evidence of tendon rupture or entrapment. Electronically Signed   By: Richardean Sale M.D.   On: 11/18/2018 14:51   Dg Shoulder Left Result Date: 11/18/2018 CLINICAL DATA:  Pain after fall EXAM: LEFT SHOULDER - 2+ VIEW COMPARISON:  None. FINDINGS: There is no evidence of fracture or dislocation. There is no evidence of arthropathy or other focal bone abnormality. Soft tissues are unremarkable. IMPRESSION: Negative. Electronically Signed   By: Dorise Bullion III M.D   On: 11/18/2018 13:51   Dg Foot Complete Left Result Date: 11/18/2018 CLINICAL DATA:  Pain after fall EXAM: LEFT FOOT - COMPLETE 3+ VIEW COMPARISON:  None. FINDINGS: There is a comminuted calcaneal fracture. A soft tissue calcification lateral to the fibula on the [facility] view is consistent with a fracture fragment of uncertain source. No fractures in the toes or metatarsals are identified. The cuneiform bones are grossly intact. No obvious talus fracture. IMPRESSION: 1. Comminuted fracture of the calcaneus. 2. Fracture fragment adjacent to the distal fibula of uncertain source.  Recommend attention to this region on the ankle films. 3. No other definitive fractures in the foot. Electronically Signed   By: Dorise Bullion III M.D   On: 11/18/2018 13:47     1510:  Left LE/foot muscles compartments soft, pulses are palp. Remains NT right LE, including foot, as well as NT spine exam. Abd remains benign, resps easy, neuro exam unchanged, NAD, VSS. T/C returned from [place] on call with Dr. Lucia Gaskins, case discussed, including:  HPI, pertinent PM/SHx, VS/PE, dx testing, ED course and treatment:  requests to place in posterior splint with plenty of padding, crutches, pain control, f/u office on Monday. Dx and testing, as well as d/w Ortho APP, d/w pt and family.  Questions answered.  Verb understanding, agreeable to d/c home with outpt f/u.    Final Clinical Impressions(s) / ED Diagnoses   Final diagnoses:  Fall from ladder    ED Discharge Orders    None       Francine Graven, DO 11/22/18 1304

## 2018-11-18 NOTE — ED Notes (Signed)
Return to rad for lumbar films

## 2018-11-18 NOTE — ED Notes (Signed)
Dr McM in to assess 

## 2018-11-18 NOTE — ED Notes (Signed)
Up on a ladder  fell onto Left foot which is now tender at lateral ankle area  Then on to L shoulder which is tender in the humeral head/shoulder area

## 2018-11-20 DIAGNOSIS — S92002A Unspecified fracture of left calcaneus, initial encounter for closed fracture: Secondary | ICD-10-CM | POA: Diagnosis not present

## 2018-11-22 DIAGNOSIS — L570 Actinic keratosis: Secondary | ICD-10-CM | POA: Diagnosis not present

## 2018-11-22 DIAGNOSIS — C44619 Basal cell carcinoma of skin of left upper limb, including shoulder: Secondary | ICD-10-CM | POA: Diagnosis not present

## 2018-11-22 DIAGNOSIS — X32XXXA Exposure to sunlight, initial encounter: Secondary | ICD-10-CM | POA: Diagnosis not present

## 2018-11-23 ENCOUNTER — Other Ambulatory Visit: Payer: Self-pay | Admitting: Orthopaedic Surgery

## 2018-11-27 NOTE — Pre-Procedure Instructions (Signed)
MEREDITH MELLS  11/27/2018      [facility] - [address], [place] - [address] [place] [address] Phone: [phone] Fax: [phone]    Your procedure is scheduled on November 30, 2018.  Report to [facility] Admitting at 530 AM.  Call this number if you have problems the morning of surgery:  [phone]   Remember:  Do not eat or drink after midnight.   Take these medicines the morning of surgery with A SIP OF WATER  Albuterol inhaler-if needed-bring inhaler with you Cetirizine (zyrtec)-if needed Oxycodone-if needed for pain Pantoprazole (protonix)  7 days prior to surgery STOP taking any Aspirin (unless otherwise instructed by your surgeon), Aleve, Naproxen, Ibuprofen, Motrin, Advil, Goody's, BC's, all herbal medications, fish oil, and all vitamins   Do not wear jewelry  Do not wear lotions, powders, or colgones, or deodorant.  Men may shave face and neck.  Do not bring valuables to the hospital.  [facility] is not responsible for any belongings or valuables.  Contacts, dentures or bridgework may not be worn into surgery.  Leave your suitcase in the car.  After surgery it may be brought to your room.  For patients admitted to the hospital, discharge time will be determined by your treatment team.  Patients discharged the day of surgery will not be allowed to drive home.    [facility]- Preparing For Surgery  Before surgery, you can play an important role. Because skin is not sterile, your skin needs to be as free of germs as possible. You can reduce the number of germs on your skin by washing with CHG (chlorahexidine gluconate) Soap before surgery.  CHG is an antiseptic cleaner which kills germs and bonds with the skin to continue killing germs even after washing.    Oral Hygiene is also important to reduce your risk of infection.  Remember - BRUSH YOUR TEETH THE MORNING OF SURGERY WITH YOUR REGULAR TOOTHPASTE  Please do  not use if you have an allergy to CHG or antibacterial soaps. If your skin becomes reddened/irritated stop using the CHG.  Do not shave (including legs and underarms) for at least 48 hours prior to first CHG shower. It is OK to shave your face.  Please follow these instructions carefully.   1. Shower the NIGHT BEFORE SURGERY and the MORNING OF SURGERY with CHG.   2. If you chose to wash your hair, wash your hair first as usual with your normal shampoo.  3. After you shampoo, rinse your hair and body thoroughly to remove the shampoo.  4. Use CHG as you would any other liquid soap. You can apply CHG directly to the skin and wash gently with a scrungie or a clean washcloth.   5. Apply the CHG Soap to your body ONLY FROM THE NECK DOWN.  Do not use on open wounds or open sores. Avoid contact with your eyes, ears, mouth and genitals (private parts). Wash Face and genitals (private parts)  with your normal soap.  6. Wash thoroughly, paying special attention to the area where your surgery will be performed.  7. Thoroughly rinse your body with warm water from the neck down.  8. DO NOT shower/wash with your normal soap after using and rinsing off the CHG Soap.  9. Pat yourself dry with a CLEAN TOWEL.  10. Wear CLEAN PAJAMAS to bed the night before surgery, wear comfortable clothes the morning of surgery  11.  Place CLEAN SHEETS on your bed the night of your first shower and DO NOT SLEEP WITH PETS.  Day of Surgery:  Do not apply any deodorants/lotions.  Please wear clean clothes to the hospital/surgery center.   Remember to brush your teeth WITH YOUR REGULAR TOOTHPASTE.  Please read over the following fact sheets that you were given.

## 2018-11-28 ENCOUNTER — Encounter (HOSPITAL_COMMUNITY): Payer: Self-pay

## 2018-11-28 ENCOUNTER — Other Ambulatory Visit: Payer: Self-pay

## 2018-11-28 ENCOUNTER — Encounter (HOSPITAL_COMMUNITY)
Admission: RE | Admit: 2018-11-28 | Discharge: 2018-11-28 | Disposition: A | Discharge status: Home / Self Care | Payer: BLUE CROSS/BLUE SHIELD | Source: Ambulatory Visit | Attending: Orthopaedic Surgery | Admitting: Orthopaedic Surgery

## 2018-11-28 DIAGNOSIS — X58XXXA Exposure to other specified factors, initial encounter: Secondary | ICD-10-CM | POA: Diagnosis not present

## 2018-11-28 DIAGNOSIS — S92002A Unspecified fracture of left calcaneus, initial encounter for closed fracture: Secondary | ICD-10-CM | POA: Diagnosis not present

## 2018-11-28 DIAGNOSIS — Z01818 Encounter for other preprocedural examination: Secondary | ICD-10-CM | POA: Insufficient documentation

## 2018-11-28 HISTORY — DX: Malignant (primary) neoplasm, unspecified: C80.1

## 2018-11-28 LAB — BASIC METABOLIC PANEL
Anion gap: 10 (ref 5–15)
BUN: 12 mg/dL (ref 6–20)
CALCIUM: 9.2 mg/dL (ref 8.9–10.3)
CO2: 23 mmol/L (ref 22–32)
Chloride: 103 mmol/L (ref 98–111)
Creatinine, Ser: 0.9 mg/dL (ref 0.61–1.24)
GFR calc non Af Amer: >60 mL/min (ref >60)
Glucose, Bld: 104 mg/dL — ABNORMAL HIGH (ref 70–99)
Potassium: 3.9 mmol/L (ref 3.5–5.1)
SODIUM: 136 mmol/L (ref 135–145)

## 2018-11-28 LAB — CBC
HCT: 49.7 % (ref 39.0–52.0)
Hemoglobin: 16.9 g/dL (ref 13.0–17.0)
MCH: 32.6 pg (ref 26.0–34.0)
MCHC: 34 g/dL (ref 30.0–36.0)
MCV: 95.8 fL (ref 80.0–100.0)
NRBC: 0 % (ref 0.0–0.2)
PLATELETS: 319 10*3/uL (ref 150–400)
RBC: 5.19 MIL/uL (ref 4.22–5.81)
RDW: 11.9 % (ref 11.5–15.5)
WBC: 8.5 10*3/uL (ref 4.0–10.5)

## 2018-11-28 NOTE — Progress Notes (Addendum)
PCP: Merrily Pew Dettinger  DM: denies  SA: denies  Pt has on going cough.  Not a new symptom.  Pt denies SOB, fever, chest pain  Pt stated understanding of instructions given for DOS.  Pt has open sore on upper left arm.  Had skin cancer spot removed on Dec. 18th.

## 2018-11-29 NOTE — Anesthesia Preprocedure Evaluation (Addendum)
Anesthesia Evaluation  Patient identified by MRN, date of birth, ID band Patient awake    Reviewed: Allergy & Precautions, NPO status , Patient's Chart, lab work & pertinent test results  History of Anesthesia Complications Negative for: history of anesthetic complications  Airway Mallampati: II  TM Distance: >3 FB Neck ROM: Full    Dental  (+) Missing, Dental Advisory Given   Pulmonary asthma (last inhaler use 3 weeks ago) ,    breath sounds clear to auscultation       Cardiovascular negative cardio ROS   Rhythm:Regular Rate:Normal     Neuro/Psych negative neurological ROS     GI/Hepatic Neg liver ROS, GERD  Medicated and Controlled,  Endo/Other  obese  Renal/GU negative Renal ROS     Musculoskeletal   Abdominal (+) + obese,   Peds  Hematology negative hematology ROS (+)   Anesthesia Other Findings   Reproductive/Obstetrics                            Anesthesia Physical Anesthesia Plan  ASA: II  Anesthesia Plan: General   Post-op Pain Management: GA combined w/ Regional for post-op pain   Induction: Intravenous  PONV Risk Score and Plan: 3 and Ondansetron, Dexamethasone and Scopolamine patch - Pre-op  Airway Management Planned: Oral ETT  Additional Equipment:   Intra-op Plan:   Post-operative Plan: Extubation in OR  Informed Consent: I have reviewed the patients History and Physical, chart, labs and discussed the procedure including the risks, benefits and alternatives for the proposed anesthesia with the patient or authorized representative who has indicated his/her understanding and acceptance.   Dental advisory given  Plan Discussed with: CRNA and Surgeon  Anesthesia Plan Comments: (Plan routine monitors, GETA with popliteal block for post op analgesia)       Anesthesia Quick Evaluation

## 2018-11-30 ENCOUNTER — Ambulatory Visit (HOSPITAL_COMMUNITY): Payer: BLUE CROSS/BLUE SHIELD | Admitting: Vascular Surgery

## 2018-11-30 ENCOUNTER — Encounter (HOSPITAL_COMMUNITY)
Admission: RE | Disposition: A | Discharge status: Home / Self Care | Payer: Self-pay | Source: Home / Self Care | Attending: Orthopaedic Surgery

## 2018-11-30 ENCOUNTER — Ambulatory Visit (HOSPITAL_COMMUNITY)
Admission: RE | Admit: 2018-11-30 | Discharge: 2018-11-30 | Disposition: A | Discharge status: Home / Self Care | Payer: BLUE CROSS/BLUE SHIELD | Attending: Orthopaedic Surgery | Admitting: Orthopaedic Surgery

## 2018-11-30 ENCOUNTER — Encounter (HOSPITAL_COMMUNITY): Payer: Self-pay

## 2018-11-30 ENCOUNTER — Ambulatory Visit (HOSPITAL_COMMUNITY): Payer: BLUE CROSS/BLUE SHIELD

## 2018-11-30 ENCOUNTER — Other Ambulatory Visit: Payer: Self-pay

## 2018-11-30 ENCOUNTER — Ambulatory Visit (HOSPITAL_COMMUNITY): Payer: BLUE CROSS/BLUE SHIELD | Admitting: Certified Registered"

## 2018-11-30 DIAGNOSIS — S86302A Unspecified injury of muscle(s) and tendon(s) of peroneal muscle group at lower leg level, left leg, initial encounter: Secondary | ICD-10-CM | POA: Diagnosis not present

## 2018-11-30 DIAGNOSIS — S92062A Displaced intraarticular fracture of left calcaneus, initial encounter for closed fracture: Secondary | ICD-10-CM | POA: Insufficient documentation

## 2018-11-30 DIAGNOSIS — Z85828 Personal history of other malignant neoplasm of skin: Secondary | ICD-10-CM | POA: Diagnosis not present

## 2018-11-30 DIAGNOSIS — G8918 Other acute postprocedural pain: Secondary | ICD-10-CM | POA: Diagnosis not present

## 2018-11-30 DIAGNOSIS — Z419 Encounter for procedure for purposes other than remedying health state, unspecified: Secondary | ICD-10-CM

## 2018-11-30 DIAGNOSIS — W11XXXA Fall on and from ladder, initial encounter: Secondary | ICD-10-CM | POA: Insufficient documentation

## 2018-11-30 DIAGNOSIS — K219 Gastro-esophageal reflux disease without esophagitis: Secondary | ICD-10-CM | POA: Insufficient documentation

## 2018-11-30 DIAGNOSIS — S86392A Other injury of muscle(s) and tendon(s) of peroneal muscle group at lower leg level, left leg, initial encounter: Secondary | ICD-10-CM | POA: Insufficient documentation

## 2018-11-30 HISTORY — PX: OPEN REDUCTION, INTERNAL FIXATION (ORIF) CALCANEAL FRACTURE WITH FUSION: SHX5994

## 2018-11-30 HISTORY — DX: Unspecified fracture of left calcaneus, initial encounter for closed fracture: S92.002A

## 2018-11-30 SURGERY — OPEN REDUCTION, INTERNAL FIXATION (ORIF) CALCANEAL FRACTURE WITH FUSION
Anesthesia: General | Site: Foot | Laterality: Left

## 2018-11-30 MED ORDER — ONDANSETRON HCL 4 MG/2ML IJ SOLN
INTRAMUSCULAR | Status: AC
  Filled 2018-11-30: qty 2

## 2018-11-30 MED ORDER — ASPIRIN 325 MG PO TABS: 325.0000 mg | ORAL_TABLET | Freq: Every day | ORAL | 11 refills | Status: DC

## 2018-11-30 MED ORDER — BUPIVACAINE-EPINEPHRINE (PF) 0.5% -1:200000 IJ SOLN
INTRAMUSCULAR | Status: DC | PRN
  Administered 2018-11-30: 30 mL via PERINEURAL

## 2018-11-30 MED ORDER — PHENYLEPHRINE 40 MCG/ML (10ML) SYRINGE FOR IV PUSH (FOR BLOOD PRESSURE SUPPORT)
PREFILLED_SYRINGE | INTRAVENOUS | Status: DC | PRN
  Administered 2018-11-30: 80 ug via INTRAVENOUS
  Administered 2018-11-30: 120 ug via INTRAVENOUS

## 2018-11-30 MED ORDER — ROCURONIUM BROMIDE 50 MG/5ML IV SOSY
PREFILLED_SYRINGE | INTRAVENOUS | Status: AC
  Filled 2018-11-30: qty 5

## 2018-11-30 MED ORDER — LIDOCAINE 2% (20 MG/ML) 5 ML SYRINGE
INTRAMUSCULAR | Status: DC | PRN
  Administered 2018-11-30: 40 mg via INTRAVENOUS

## 2018-11-30 MED ORDER — MIDAZOLAM HCL 2 MG/2ML IJ SOLN
INTRAMUSCULAR | Status: AC
  Filled 2018-11-30: qty 2

## 2018-11-30 MED ORDER — SODIUM CHLORIDE 0.9 % IV SOLN
INTRAVENOUS | Status: DC | PRN
  Administered 2018-11-30: 50 ug/min via INTRAVENOUS

## 2018-11-30 MED ORDER — FENTANYL CITRATE (PF) 250 MCG/5ML IJ SOLN
INTRAMUSCULAR | Status: AC
  Filled 2018-11-30: qty 5

## 2018-11-30 MED ORDER — LACTATED RINGERS IV SOLN
INTRAVENOUS | Status: DC | PRN
  Administered 2018-11-30: 07:00:00 via INTRAVENOUS

## 2018-11-30 MED ORDER — LABETALOL HCL 5 MG/ML IV SOLN
INTRAVENOUS | Status: AC
  Filled 2018-11-30: qty 4

## 2018-11-30 MED ORDER — GLYCOPYRROLATE PF 0.2 MG/ML IJ SOSY
PREFILLED_SYRINGE | INTRAMUSCULAR | Status: AC
  Filled 2018-11-30: qty 1

## 2018-11-30 MED ORDER — ROCURONIUM BROMIDE 10 MG/ML (PF) SYRINGE
PREFILLED_SYRINGE | INTRAVENOUS | Status: DC | PRN
  Administered 2018-11-30: 50 mg via INTRAVENOUS

## 2018-11-30 MED ORDER — PROPOFOL 10 MG/ML IV BOLUS
INTRAVENOUS | Status: AC
  Filled 2018-11-30: qty 40

## 2018-11-30 MED ORDER — SCOPOLAMINE 1 MG/3DAYS TD PT72
1.0000 | MEDICATED_PATCH | Freq: Once | TRANSDERMAL | Status: DC
  Administered 2018-11-30: 1.5 mg via TRANSDERMAL
  Filled 2018-11-30: qty 1

## 2018-11-30 MED ORDER — PROPOFOL 10 MG/ML IV BOLUS
INTRAVENOUS | Status: DC | PRN
  Administered 2018-11-30: 150 mg via INTRAVENOUS

## 2018-11-30 MED ORDER — SUGAMMADEX SODIUM 200 MG/2ML IV SOLN
INTRAVENOUS | Status: DC | PRN
  Administered 2018-11-30: 180 mg via INTRAVENOUS

## 2018-11-30 MED ORDER — DEXAMETHASONE SODIUM PHOSPHATE 10 MG/ML IJ SOLN
INTRAMUSCULAR | Status: AC
  Filled 2018-11-30: qty 1

## 2018-11-30 MED ORDER — OXYCODONE HCL 5 MG PO TABS: 5.0000 mg | ORAL_TABLET | ORAL | 0 refills | Status: AC | PRN

## 2018-11-30 MED ORDER — ONDANSETRON HCL 4 MG/2ML IJ SOLN
INTRAMUSCULAR | Status: DC | PRN
  Administered 2018-11-30: 4 mg via INTRAVENOUS

## 2018-11-30 MED ORDER — MIDAZOLAM HCL 2 MG/2ML IJ SOLN: 0.5000 mg | Freq: Once | INTRAMUSCULAR | Status: DC | PRN

## 2018-11-30 MED ORDER — LIDOCAINE 2% (20 MG/ML) 5 ML SYRINGE
INTRAMUSCULAR | Status: AC
  Filled 2018-11-30: qty 5

## 2018-11-30 MED ORDER — POVIDONE-IODINE 10 % EX SWAB: 2.0000 "application " | Freq: Once | CUTANEOUS | Status: DC

## 2018-11-30 MED ORDER — MEPERIDINE HCL 50 MG/ML IJ SOLN: 6.2500 mg | INTRAMUSCULAR | Status: DC | PRN

## 2018-11-30 MED ORDER — PHENYLEPHRINE 40 MCG/ML (10ML) SYRINGE FOR IV PUSH (FOR BLOOD PRESSURE SUPPORT)
PREFILLED_SYRINGE | INTRAVENOUS | Status: AC
  Filled 2018-11-30: qty 10

## 2018-11-30 MED ORDER — MIDAZOLAM HCL 5 MG/5ML IJ SOLN
INTRAMUSCULAR | Status: DC | PRN
  Administered 2018-11-30: 2 mg via INTRAVENOUS

## 2018-11-30 MED ORDER — OXYCODONE HCL 5 MG PO TABS
ORAL_TABLET | ORAL | Status: AC
  Filled 2018-11-30: qty 1

## 2018-11-30 MED ORDER — FENTANYL CITRATE (PF) 100 MCG/2ML IJ SOLN
INTRAMUSCULAR | Status: DC | PRN
  Administered 2018-11-30: 25 ug via INTRAVENOUS
  Administered 2018-11-30: 100 ug via INTRAVENOUS
  Administered 2018-11-30 (×3): 50 ug via INTRAVENOUS
  Administered 2018-11-30: 25 ug via INTRAVENOUS
  Administered 2018-11-30: 50 ug via INTRAVENOUS

## 2018-11-30 MED ORDER — DEXAMETHASONE SODIUM PHOSPHATE 10 MG/ML IJ SOLN
INTRAMUSCULAR | Status: DC | PRN
  Administered 2018-11-30: 10 mg via INTRAVENOUS

## 2018-11-30 MED ORDER — 0.9 % SODIUM CHLORIDE (POUR BTL) OPTIME
TOPICAL | Status: DC | PRN
  Administered 2018-11-30: 1000 mL

## 2018-11-30 MED ORDER — OXYCODONE HCL 5 MG PO TABS
5.0000 mg | ORAL_TABLET | Freq: Once | ORAL | Status: AC
  Administered 2018-11-30: 5 mg via ORAL

## 2018-11-30 MED ORDER — CEFAZOLIN SODIUM-DEXTROSE 2-4 GM/100ML-% IV SOLN
2.0000 g | INTRAVENOUS | Status: AC
  Administered 2018-11-30: 2 g via INTRAVENOUS
  Filled 2018-11-30: qty 100

## 2018-11-30 MED ORDER — PROMETHAZINE HCL 25 MG/ML IJ SOLN: 6.2500 mg | INTRAMUSCULAR | Status: DC | PRN

## 2018-11-30 MED ORDER — HYDROMORPHONE HCL 1 MG/ML IJ SOLN: 0.2500 mg | INTRAMUSCULAR | Status: DC | PRN

## 2018-11-30 MED ORDER — LABETALOL HCL 5 MG/ML IV SOLN
5.0000 mg | INTRAVENOUS | Status: DC | PRN
  Administered 2018-11-30: 5 mg via INTRAVENOUS

## 2018-11-30 SURGICAL SUPPLY — 75 items
ANCH SUT 0 2DMD BSUT TK 14.5X3 (Anchor) ×1 IMPLANT
ANCHOR BIO-SUTURETAK 0 FWIRE (Anchor) ×1 IMPLANT
BANDAGE ESMARK 6X9 LF (GAUZE/BANDAGES/DRESSINGS) ×1 IMPLANT
BIT DRILL 2.5 CANN STRL (BIT) ×1 IMPLANT
BLADE SURG 15 STRL LF DISP TIS (BLADE) ×1 IMPLANT
BLADE SURG 15 STRL SS (BLADE) ×2
BNDG CMPR 9X6 STRL LF SNTH (GAUZE/BANDAGES/DRESSINGS) ×1
BNDG COHESIVE 4X5 TAN STRL (GAUZE/BANDAGES/DRESSINGS) ×1 IMPLANT
BNDG COHESIVE 6X5 TAN STRL LF (GAUZE/BANDAGES/DRESSINGS) ×2 IMPLANT
BNDG ESMARK 6X9 LF (GAUZE/BANDAGES/DRESSINGS) ×2
BONE CANC CHIPS 20CC PCAN1/4 (Bone Implant) ×2 IMPLANT
CANISTER SUCT 3000ML PPV (MISCELLANEOUS) ×2 IMPLANT
CHIPS CANC BONE 20CC PCAN1/4 (Bone Implant) ×1 IMPLANT
CHLORAPREP W/TINT 26ML (MISCELLANEOUS) ×3 IMPLANT
COVER SURGICAL LIGHT HANDLE (MISCELLANEOUS) ×2 IMPLANT
COVER WAND RF STERILE (DRAPES) ×1 IMPLANT
CUFF TOURNIQUET SINGLE 34IN LL (TOURNIQUET CUFF) ×2 IMPLANT
DRAPE C-ARM 42X72 X-RAY (DRAPES) ×1 IMPLANT
DRAPE C-ARMOR (DRAPES) ×1 IMPLANT
DRAPE U-SHAPE 47X51 STRL (DRAPES) ×2 IMPLANT
DRSG MEPITEL 4X7.2 (GAUZE/BANDAGES/DRESSINGS) ×2 IMPLANT
DRSG PAD ABDOMINAL 8X10 ST (GAUZE/BANDAGES/DRESSINGS) ×4 IMPLANT
ELECT REM PT RETURN 9FT ADLT (ELECTROSURGICAL) ×2
ELECTRODE REM PT RTRN 9FT ADLT (ELECTROSURGICAL) ×1 IMPLANT
GAUZE SPONGE 4X4 12PLY STRL (GAUZE/BANDAGES/DRESSINGS) IMPLANT
GAUZE SPONGE 4X4 12PLY STRL LF (GAUZE/BANDAGES/DRESSINGS) ×1 IMPLANT
GAUZE XEROFORM 1X8 LF (GAUZE/BANDAGES/DRESSINGS) ×1 IMPLANT
GLOVE BIO SURGEON STRL SZ7.5 (GLOVE) ×2 IMPLANT
GLOVE BIOGEL PI IND STRL 8 (GLOVE) ×1 IMPLANT
GLOVE BIOGEL PI INDICATOR 8 (GLOVE) ×1
GLOVE ECLIPSE 8.0 STRL XLNG CF (GLOVE) ×1 IMPLANT
GLOVE SURG SS PI 8.0 STRL IVOR (GLOVE) ×2 IMPLANT
GOWN STRL REUS W/ TWL LRG LVL3 (GOWN DISPOSABLE) ×1 IMPLANT
GOWN STRL REUS W/ TWL XL LVL3 (GOWN DISPOSABLE) ×2 IMPLANT
GOWN STRL REUS W/TWL LRG LVL3 (GOWN DISPOSABLE) ×2
GOWN STRL REUS W/TWL XL LVL3 (GOWN DISPOSABLE) ×4
GRAFT BNE CANC CHIPS 1-8 20CC (Bone Implant) IMPLANT
K-WIRE PLA 9 .062 (WIRE) ×1 IMPLANT
K-WIRE TROCAR 1.35 (MISCELLANEOUS) ×4
KIT BASIN OR (CUSTOM PROCEDURE TRAY) ×2 IMPLANT
KIT SUTURETAK 2.4 DRILL BIT (KITS) ×1 IMPLANT
KIT TURNOVER KIT B (KITS) ×2 IMPLANT
KWIRE TROCAR 1.35 (MISCELLANEOUS) IMPLANT
NS IRRIG 1000ML POUR BTL (IV SOLUTION) ×2 IMPLANT
PACK ORTHO EXTREMITY (CUSTOM PROCEDURE TRAY) ×2 IMPLANT
PAD ABD 8X10 STRL (GAUZE/BANDAGES/DRESSINGS) ×1 IMPLANT
PAD ARMBOARD 7.5X6 YLW CONV (MISCELLANEOUS) ×4 IMPLANT
PAD CAST 4YDX4 CTTN HI CHSV (CAST SUPPLIES) ×1 IMPLANT
PADDING CAST COTTON 4X4 STRL (CAST SUPPLIES) ×2
PADDING CAST COTTON 6X4 STRL (CAST SUPPLIES) ×1 IMPLANT
PIN SCHANZ FIXATION SD 5 (EXFIX) ×1 IMPLANT
PIN TROCAR POINT 2MM-1 (PIN) ×1 IMPLANT
PLATE CALC PERIMETER MED RT (Plate) ×1 IMPLANT
SCREW CORT TI FT 3.5X38 (Screw) ×1 IMPLANT
SCREW LOCK T15 FT 30X3.5XST (Screw) IMPLANT
SCREW LOCK TI FT 3.5X34 (Screw) ×2 IMPLANT
SCREW LOCK TI FT 3.5X42 (Screw) ×2 IMPLANT
SCREW LOCKING 3.5X30MM (Screw) ×2 IMPLANT
SCREW LP TIT 3.5X32 (Screw) ×1 IMPLANT
SCREW LP TIT 3.5X36 (Screw) ×1 IMPLANT
SPLINT PLASTER CAST XFAST 5X30 (CAST SUPPLIES) IMPLANT
SPLINT PLASTER XFAST SET 5X30 (CAST SUPPLIES) ×1
SPONGE LAP 18X18 X RAY DECT (DISPOSABLE) ×1 IMPLANT
SUCTION FRAZIER HANDLE 10FR (MISCELLANEOUS) ×1
SUCTION TUBE FRAZIER 10FR DISP (MISCELLANEOUS) ×1 IMPLANT
SUT ETHILON 3 0 PS 1 (SUTURE) ×2 IMPLANT
SUT MNCRL AB 3-0 PS2 18 (SUTURE) IMPLANT
SUT VIC AB 0 CT1 27 (SUTURE) ×2
SUT VIC AB 0 CT1 27XBRD ANBCTR (SUTURE) IMPLANT
SUT VIC AB 2-0 CT1 27 (SUTURE) ×4
SUT VIC AB 2-0 CT1 TAPERPNT 27 (SUTURE) ×2 IMPLANT
TOWEL OR 17X24 6PK STRL BLUE (TOWEL DISPOSABLE) ×2 IMPLANT
TOWEL OR 17X26 10 PK STRL BLUE (TOWEL DISPOSABLE) ×2 IMPLANT
TUBE CONNECTING 12X1/4 (SUCTIONS) ×2 IMPLANT
YANKAUER SUCT BULB TIP NO VENT (SUCTIONS) ×1 IMPLANT

## 2018-11-30 NOTE — Op Note (Signed)
[name] male 57 y.o. 11/30/2018  PreOperative Diagnosis: Left displaced, intra-articular calcaneus fracture Superior peroneal retinaculum avulsion fracture Peroneal tendon subluxation  PostOperative Diagnosis: Same   Procedure(s) and Anesthesia Type:    * OPEN REDUCTION, INTERNAL FIXATION (ORIF) CALCANEAL FRACTURE WITH PERONEAL DEBRIDEMENT & REPAIR OF SUPERIOR PERONEAL RETINACULUM - General  Surgeon: Erle Crocker   Assistants: Larkin Ina, RNFA  Anesthesia: General endotracheal anesthesia, popliteal block  Findings: Displaced, intra-articular fracture of the left calcaneus Superior peroneal avulsion fracture with peroneal tendon subluxation  Implants:  Arthrex calcaneus locking plate Arthrex suture tack  Indications:57 y.o. male fell from a ladder approximately 1 week ago.  He was seen at the emergency department diagnosed with a displaced, intra-articular calcaneus fracture.  He was sent to my clinic for follow-up.  CT scan and x-rays reviewed in my clinic and had significant amount of valgus displacement and tilt of his posterior facet.  We discussed nonoperative and operative treatment.  He opted for operative treatment.  We discussed the risk, benefits and alternatives of surgery which included but were not limited to wound healing complications, infection, malunion, nonunion, continued surgery, stiffness, need for second surgery, less than optimal outcome, damage surrounding structures, we also discussed the perioperative and anesthetic risk which included death.  After weighing these risks he opted to proceed with surgery.  Procedure Detail: Patient was identified in the preoperative holding area.  The left lower extremity was marked on the cell.  The consent was signed myself the patient.  A popliteal nerve block was performed by anesthesia.  He was taken to the operative suite and general anesthesia was induced without difficulty.  He was placed lateral on a  beanbag and all bony prominences were well-padded.  A platform was created using sheets for his left leg.  A thigh tourniquet was placed on the left thigh.  Preoperative antibiotics were given.  The left lower extremity was prepped and draped in the usual sterile fashion.  A surgical pause was performed.  The tourniquet was elevated to 250 mmHg.  I began by making a sinus Tarsi incision from the tip of the fibula down along the fourth ray.  This was taken sharply down through skin and subcutaneous tissue.  Then using blunt dissection I was able to mobilize the subcutaneous tissues and did not identify the sural nerve which was not in the surgical field.  I was able to take this sharply down and identify the peroneal tendons.  There was a large amount of swelling and subluxation the peroneal tendons palpable behind the fibula.  The incision was extended up in the retro-fibular area.  This was taken sharply again down through skin and subcutaneous tissue.  I was able to identify the avulsion fracture of the superior peroneal retinaculum.  This was tagged with a 2-0 Vicryl stitch.  The peroneal tendons were then inspected.  There is no tears in the peroneal tendon but there was significant amount of hematoma and fibrous tissue there and this was debrided back using soft tissue scissors and 15 blade.  Then distally I entered the subtalar joint.  There was significant bony depression of the posterior facet.  There is fracture that extended into the anterior process the calcaneus.  The CC joint was inspected and the cartilaginous structures intact.  I was able to mobilize the fracture fragments.  I did this by sharply performing a periosteal dissection along the lateral wall that had been fractured and was displaced and comminuted.  All the  fracture fragments were mobilized.  There was a large posterior tuberosity piece as well as a large posterior facet piece that was depressed.  There is a large cavity within the  calcaneus due to the compressive nature of the fracture.  The fracture fragments were mobilized and reduced.  They were held in place with a K wire.  The appropriate reduction was confirmed on fluoroscopy.  Then an Arthrex calcaneal locking plate was placed.  This got good fixation and maintained the reduction.  Appropriate position of the subtalar joint and posterior tuberosity was confirmed on axial Harris view and lateral fluoroscopy.  The screw lengths were confirmed and appropriate screw position was confirmed on intraoperative fluoroscopy.  Then using a 3.5 mm Arthrex suture tack the superior peroneal retinaculum was reconstructed.  The tendons were freed glide within the retinaculum and did not subluxate on repeat testing.  Then 20 cc of cancellus chips were placed into the void of the calcaneus.  This was then bone tamped into place.  This filled the void adequately.  The wounds were then irrigated copiously with normal saline.  The K wires were removed and there was good motion of the subtalar joint.  The fracture fragments were well fixed.  The tourniquet was released.  Hemostasis was obtained with Bovie cautery.  Then the deep tissue was closed with a 2-0 PDS suture.  This was done to obtain some tissue between the peroneal tendons and the plate distally.  Then the more superficial deep tissue was again closed with 2-0 PDS.  The subcuticular tissue was closed with 2-0 Monocryl and the skin with 3-0 nylon.  Xeroform, 4 x 4's, sterile she cotton was placed.  He was then placed in a short leg splint.  The counts were correct at the end of the case.  There were no complications.  Post Op Instructions: Nonweightbearing left lower extremity Keep splint dry Take 1 325 mg aspirin a day for DVT prophylaxis Follow-up in 2 weeks for splint removal, suture removal and x-rays.  He will likely be placed into a cast at that time Nonweightbearing a total of 8 weeks  Estimated Blood Loss:  less than 50  mL         Drains: none  Blood Given: none         Specimens: none       Complications:  * No complications entered in OR log *         Disposition: PACU - hemodynamically stable.         Condition: stable

## 2018-11-30 NOTE — Anesthesia Procedure Notes (Signed)
Procedure Name: Intubation Date/Time: 11/30/2018 7:39 AM Performed by: Cleda Daub, CRNA Pre-anesthesia Checklist: Patient identified, Emergency Drugs available, Suction available and Patient being monitored Patient Re-evaluated:Patient Re-evaluated prior to induction Oxygen Delivery Method: Circle system utilized Preoxygenation: Pre-oxygenation with 100% oxygen Induction Type: IV induction Ventilation: Mask ventilation without difficulty and Mask ventilation throughout procedure Laryngoscope Size: Mac and 3 Grade View: Grade I Tube type: Oral Tube size: 8.0 mm Number of attempts: 1 Airway Equipment and Method: Stylet Placement Confirmation: ETT inserted through vocal cords under direct vision,  positive ETCO2 and breath sounds checked- equal and bilateral Secured at: 23 cm Tube secured with: Tape Dental Injury: Teeth and Oropharynx as per pre-operative assessment

## 2018-11-30 NOTE — Transfer of Care (Signed)
Immediate Anesthesia Transfer of Care Note  Patient: KRISTOF NADEEM  Procedure(s) Performed: OPEN REDUCTION, INTERNAL FIXATION (ORIF) CALCANEAL FRACTURE WITH PERONEAL DEBRIDEMENT & REPAIR OF SUPERIOR PERONEAL RETINACULUM (Left Foot)  Patient Location: PACU  Anesthesia Type:GA combined with regional for post-op pain  Level of Consciousness: drowsy  Airway & Oxygen Therapy: Patient Spontanous Breathing and Patient connected to face mask oxygen  Post-op Assessment: Report given to RN and Post -op Vital signs reviewed and stable  Post vital signs: Reviewed and stable  Last Vitals:  Vitals Value Taken Time  BP 126/89 11/30/2018 10:31 AM  Temp    Pulse 104 11/30/2018 10:32 AM  Resp 20 11/30/2018 10:32 AM  SpO2 96 % 11/30/2018 10:32 AM  Vitals shown include unvalidated device data.  Last Pain:  Vitals:   11/30/18 0612  TempSrc:   PainSc: 0-No pain      Patients Stated Pain Goal: 3 (79/15/05 6979)  Complications: No apparent anesthesia complications

## 2018-11-30 NOTE — Anesthesia Postprocedure Evaluation (Signed)
Anesthesia Post Note  Patient: [name]  Procedure(s) Performed: OPEN REDUCTION, INTERNAL FIXATION (ORIF) CALCANEAL FRACTURE WITH PERONEAL DEBRIDEMENT & REPAIR OF SUPERIOR PERONEAL RETINACULUM (Left Foot)     Patient location during evaluation: PACU Anesthesia Type: General and Regional Level of consciousness: awake and alert, patient cooperative and oriented Pain management: pain level controlled Vital Signs Assessment: post-procedure vital signs reviewed and stable Respiratory status: spontaneous breathing, nonlabored ventilation and respiratory function stable Cardiovascular status: blood pressure returned to baseline and stable Postop Assessment: no apparent nausea or vomiting Anesthetic complications: no    Last Vitals:  Vitals:   11/30/18 1200 11/30/18 1215  BP: (!) 124/92 (!) 125/91  Pulse: 95 93  Resp: 17 17  Temp: 36.4 C   SpO2: 94% 93%    Last Pain:  Vitals:   11/30/18 1200  TempSrc:   PainSc: 0-No pain                 Levi Klaiber,E. Fanny Agan

## 2018-11-30 NOTE — H&P (Signed)
[name] is an 57 y.o. male.   Chief Complaint: Left calcaneus fracture, intra-articular and depressed. HPI: Patient fell from a ladder approximately 1 week ago and sustained the above injury.  He had immediate pain and deformity in his foot.  He was seen in my clinic where x-ray and CT scan revealed a depressed, intra-articular calcaneus fracture.  There is the appearance of a fleck sign at the site of the superior peroneal retinaculum as well.  Given his injury we discussed nonoperative versus operative intervention.  He opted for operative intervention.  He is very active at baseline.  He denied any knee pain, low back pain or other joint or extremity pains.  Past Medical History:  Diagnosis Date  . Allergy   . Arthritis   . Calcaneus fracture, left   . Cancer [facility])    Skin cancer Left Arm  . GERD (gastroesophageal reflux disease)   . Small bowel obstruction [facility])     Past Surgical History:  Procedure Laterality Date  . HAND SURGERY Right    contracture of hand  . INGUINAL HERNIA REPAIR Right 11/23/2013   Procedure: HERNIA REPAIR INGUINAL ADULT;  Surgeon: Scherry Ran, MD;  Location: [facility];  Service: General;  Laterality: Right;  site-inguinal area  . VASECTOMY    . WRIST SURGERY Right    otif    Family History  Problem Relation Age of Onset  . Stroke Mother   . Cancer Mother        colon  . Alzheimer's disease Father        84 years  . Heart disease Father        atrial fibri  . Heart disease Sister   . Early death Brother   . Cancer Maternal Grandmother    Social History:  reports that he has never smoked. He has never used smokeless tobacco. He reports current alcohol use of about 4.0 standard drinks of alcohol per week. He reports that he does not use drugs.  Allergies:  Allergies  Allergen Reactions  . Ibuprofen Swelling  . Morphine And Related Swelling  . Bee Venom Rash    Medications Prior to Admission  Medication Sig Dispense Refill  .  albuterol (PROAIR HFA) 108 (90 Base) MCG/ACT inhaler Inhale 2 puffs into the lungs every 6 (six) hours as needed for wheezing or shortness of breath. 1 Inhaler 5  . cetirizine (ZYRTEC) 10 MG tablet Take 1 tablet (10 mg total) by mouth daily. (Patient taking differently: Take 10 mg by mouth daily as needed for allergies. ) 30 tablet 11  . oxyCODONE (OXY IR/ROXICODONE) 5 MG immediate release tablet Take 5 mg by mouth every 6 (six) hours as needed for severe pain.    . pantoprazole (PROTONIX) 40 MG tablet TAKE ONE (1) TABLET EACH DAY (Patient taking differently: Take 40 mg by mouth daily. ) 90 tablet 3  . oxyCODONE-acetaminophen (PERCOCET/ROXICET) 5-325 MG tablet 1 or 2 tabs PO q6h prn pain (Patient not taking: Reported on 11/24/2018) 20 tablet 0    Results for orders placed or performed during the hospital encounter of 11/28/18 (from the past 48 hour(s))  Basic metabolic panel     Status: Abnormal   Collection Time: 11/28/18 11:35 AM  Result Value Ref Range   Sodium 136 135 - 145 mmol/L   Potassium 3.9 3.5 - 5.1 mmol/L   Chloride 103 98 - 111 mmol/L   CO2 23 22 - 32 mmol/L   Glucose, Bld 104 (H)  70 - 99 mg/dL   BUN 12 6 - 20 mg/dL   Creatinine, Ser 0.90 0.61 - 1.24 mg/dL   Calcium 9.2 8.9 - 10.3 mg/dL   GFR calc non Af Amer >60 >60 mL/min   GFR calc Af Amer >60 >60 mL/min   Anion gap 10 5 - 15    Comment: Performed at [facility] [address], [place] [address]  CBC     Status: None   Collection Time: 11/28/18 11:35 AM  Result Value Ref Range   WBC 8.5 4.0 - 10.5 K/uL   RBC 5.19 4.22 - 5.81 MIL/uL   Hemoglobin 16.9 13.0 - 17.0 g/dL   HCT 49.7 39.0 - 52.0 %   MCV 95.8 80.0 - 100.0 fL   MCH 32.6 26.0 - 34.0 pg   MCHC 34.0 30.0 - 36.0 g/dL   RDW 11.9 11.5 - 15.5 %   Platelets 319 150 - 400 K/uL   nRBC 0.0 0.0 - 0.2 %    Comment: Performed at [facility], [place] [address], [place], [place] [address]   No results found.  Review of Systems  Constitutional:  Negative.   HENT: Negative.   Eyes: Negative.   Respiratory: Negative.   Cardiovascular: Negative.   Gastrointestinal: Negative.   Musculoskeletal:       Left foot pain  Skin: Negative.   Neurological: Negative.   Psychiatric/Behavioral: Negative.     Blood pressure (!) 136/98, pulse 76, temperature 98.7 F (37.1 C), temperature source Oral, resp. rate 20, height 5\' 5"  (1.651 m), weight 86.2 kg, SpO2 98 %. Physical Exam  Constitutional: He appears well-developed.  HENT:  Head: Normocephalic.  Eyes: Conjunctivae are normal.  Neck: Neck supple.  Cardiovascular: Normal rate.  Respiratory: Effort normal.  GI: Soft.  Musculoskeletal:     Comments: Left foot in splint. Toes sxposed WWP. Wiggles toes. SILT toes.   Neurological: He is alert.  Skin: Skin is warm.  Psychiatric: He has a normal mood and affect.     Assessment/Plan We will proceed with planned surgical intervention.  This will be open treatment of the left calcaneus fracture and inspection and repair of peroneal tendons and SPR as needed.  He understands the risk, benefits and alternatives to the surgery which are discussed in detail in the clinic.  Risks included but were not limited to wound healing complication, infection, malunion, nonunion, need for second surgery, continued pain, less than optimal outcome, we also discussed the perioperative and anesthetic risk briefly which included death.  Erle Crocker, MD 11/30/2018, 7:14 AM

## 2018-11-30 NOTE — Discharge Instructions (Signed)
DR. Lucia G[facility] SURG[facility] POST-OP INSTRUCTIONS   Pain Management 1. The numbing medicine and your leg will last around 6 hours, take a dose of your pain medicine as soon as you feel it wearing off to avoid rebound pain. 2. Keep your foot elevated above heart level.  Make sure that your heel hangs free ('floats'). 3. Take all prescribed medication as directed. 4. If taking narcotic pain medication you may want to use an over-the-counter stool softener to avoid constipation. 5. You may take over-the-counter NSAIDs (ibuprofen, naproxen, etc.) as well as over-the-counter acetaminophen as directed on the packaging as a supplement for your pain and may also use it to wean away from the prescription medication.  Activity ?  ? Non-weightbearing ? Postoperatively, you will be placed into a splint which stays on for 2 weeks and then will be changed at your first postop visit.  First Postoperative Visit 1. Your first postop visit will be at least 2 weeks after surgery.  This should be scheduled when you schedule surgery. 2. If you do not have a postoperative visit scheduled please call [phone] to schedule an appointment. 3. At the appointment your incision will be evaluated for suture removal, x-rays will be obtained if necessary.  General Instructions 1. Swelling is very common after foot and ankle surgery.  It often takes 3 months for the foot and ankle to begin to feel comfortable.  Some amount of swelling will persist for 6-12 months. 2. DO NOT change the dressing.  If there is a problem with the dressing (too tight, loose, gets wet, etc.) please contact Dr. Pollie Friar office. 3. DO NOT get the dressing wet.  For showers you can use an over-the-counter cast cover or wrap a washcloth around the top of your dressing and then cover it with a plastic bag and tape it to your leg. 4. DO NOT soak the incision (no tubs, pools, bath, etc.) until you have approval from Dr. Lucia Gaskins.  Contact Dr. Huel Cote  office or go to Emergency Room if: 1. Temperature above 101 F. 2. Increasing pain that is unresponsive to pain medication or elevation 3. Excessive redness or swelling in your foot 4. Dressing problems - excessive bloody drainage, looseness or tightness, or if dressing gets wet 5. Develop pain, swelling, warmth, or discoloration of your calf

## 2018-11-30 NOTE — Anesthesia Procedure Notes (Signed)
Anesthesia Regional Block: Popliteal block   Pre-Anesthetic Checklist: ,, timeout performed, Correct Patient, Correct Site, Correct Laterality, Correct Procedure, Correct Position, site marked, Risks and benefits discussed,  Surgical consent,  Pre-op evaluation,  At surgeon's request and post-op pain management  Laterality: Left and Lower  Prep: chloraprep       Needles:  Injection technique: Single-shot  Needle Type: Echogenic Stimulator Needle     Needle Length: 9cm  Needle Gauge: 21     Additional Needles:   Procedures:, nerve stimulator,,, ultrasound used (permanent image in chart),,,,   Nerve Stimulator or Paresthesia:  Response: toe dorsiflexion, 0.4 mA, 0.1 ms,   Additional Responses:   Narrative:  Start time: 11/30/2018 7:11 AM End time: 11/30/2018 7:20 AM Injection made incrementally with aspirations every 5 mL.  Performed by: Personally  Anesthesiologist: Annye Asa, MD  Additional Notes: Pt identified in Holding room.  Monitors applied. Working IV access confirmed. Sterile prep L lateral knee.  #21ga ECHOgenic PNS to toe twitch at 0.47mA threshold with US guidance.  30cc 0.5% Bupivacaine with 1:200k epi injected incrementally after negative test dose.  Patient asymptomatic, VSS, no heme aspirated, tolerated well.  C Graci Hulce,MD

## 2018-12-01 ENCOUNTER — Encounter (HOSPITAL_COMMUNITY): Payer: Self-pay | Admitting: Orthopaedic Surgery

## 2018-12-03 ENCOUNTER — Other Ambulatory Visit: Payer: Self-pay

## 2018-12-03 ENCOUNTER — Emergency Department (HOSPITAL_COMMUNITY)
Admission: EM | Admit: 2018-12-03 | Discharge: 2018-12-04 | Disposition: A | Discharge status: Home / Self Care | Payer: BLUE CROSS/BLUE SHIELD | Attending: Emergency Medicine | Admitting: Emergency Medicine

## 2018-12-03 ENCOUNTER — Encounter (HOSPITAL_COMMUNITY): Payer: Self-pay

## 2018-12-03 ENCOUNTER — Emergency Department (HOSPITAL_COMMUNITY): Payer: BLUE CROSS/BLUE SHIELD

## 2018-12-03 DIAGNOSIS — R0600 Dyspnea, unspecified: Secondary | ICD-10-CM | POA: Diagnosis not present

## 2018-12-03 DIAGNOSIS — R61 Generalized hyperhidrosis: Secondary | ICD-10-CM | POA: Diagnosis not present

## 2018-12-03 DIAGNOSIS — I10 Essential (primary) hypertension: Secondary | ICD-10-CM | POA: Diagnosis not present

## 2018-12-03 DIAGNOSIS — R0602 Shortness of breath: Secondary | ICD-10-CM | POA: Insufficient documentation

## 2018-12-03 DIAGNOSIS — R112 Nausea with vomiting, unspecified: Secondary | ICD-10-CM | POA: Diagnosis not present

## 2018-12-03 DIAGNOSIS — R000 Tachycardia, unspecified: Secondary | ICD-10-CM | POA: Diagnosis not present

## 2018-12-03 DIAGNOSIS — R21 Rash and other nonspecific skin eruption: Secondary | ICD-10-CM | POA: Diagnosis not present

## 2018-12-03 DIAGNOSIS — R1110 Vomiting, unspecified: Secondary | ICD-10-CM | POA: Diagnosis not present

## 2018-12-03 DIAGNOSIS — Z79899 Other long term (current) drug therapy: Secondary | ICD-10-CM | POA: Diagnosis not present

## 2018-12-03 LAB — COMPREHENSIVE METABOLIC PANEL
ALT: 22 U/L (ref 0–44)
AST: 20 U/L (ref 15–41)
Albumin: 4 g/dL (ref 3.5–5.0)
Alkaline Phosphatase: 67 U/L (ref 38–126)
Anion gap: 8 (ref 5–15)
BUN: 16 mg/dL (ref 6–20)
CO2: 25 mmol/L (ref 22–32)
Calcium: 8.8 mg/dL — ABNORMAL LOW (ref 8.9–10.3)
Chloride: 104 mmol/L (ref 98–111)
Creatinine, Ser: 0.89 mg/dL (ref 0.61–1.24)
GFR calc Af Amer: >60 mL/min (ref >60)
GFR calc non Af Amer: >60 mL/min (ref >60)
GLUCOSE: 108 mg/dL — AB (ref 70–99)
Potassium: 3.5 mmol/L (ref 3.5–5.1)
SODIUM: 137 mmol/L (ref 135–145)
Total Bilirubin: 0.6 mg/dL (ref 0.3–1.2)
Total Protein: 7.3 g/dL (ref 6.5–8.1)

## 2018-12-03 LAB — CBC WITH DIFFERENTIAL/PLATELET
Abs Immature Granulocytes: 0.04 10*3/uL (ref 0.00–0.07)
Basophils Absolute: 0 10*3/uL (ref 0.0–0.1)
Basophils Relative: 0 %
Eosinophils Absolute: 0.1 10*3/uL (ref 0.0–0.5)
Eosinophils Relative: 1 %
HEMATOCRIT: 46.6 % (ref 39.0–52.0)
Hemoglobin: 16 g/dL (ref 13.0–17.0)
Immature Granulocytes: 0 %
Lymphocytes Relative: 24 %
Lymphs Abs: 2.4 10*3/uL (ref 0.7–4.0)
MCH: 33.3 pg (ref 26.0–34.0)
MCHC: 34.3 g/dL (ref 30.0–36.0)
MCV: 97.1 fL (ref 80.0–100.0)
Monocytes Absolute: 0.8 10*3/uL (ref 0.1–1.0)
Monocytes Relative: 8 %
Neutro Abs: 6.9 10*3/uL (ref 1.7–7.7)
Neutrophils Relative %: 67 %
Platelets: 311 10*3/uL (ref 150–400)
RBC: 4.8 MIL/uL (ref 4.22–5.81)
RDW: 11.9 % (ref 11.5–15.5)
WBC: 10.2 10*3/uL (ref 4.0–10.5)
nRBC: 0 % (ref 0.0–0.2)

## 2018-12-03 LAB — INFLUENZA PANEL BY PCR (TYPE A & B)
Influenza A By PCR: NEGATIVE
Influenza B By PCR: NEGATIVE

## 2018-12-03 LAB — LIPASE, BLOOD: Lipase: 30 U/L (ref 11–51)

## 2018-12-03 LAB — TROPONIN I: Troponin I: <0.03 ng/mL (ref <0.03)

## 2018-12-03 MED ORDER — IOPAMIDOL (ISOVUE-370) INJECTION 76%
100.0000 mL | Freq: Once | INTRAVENOUS | Status: AC | PRN
  Administered 2018-12-04: 100 mL via INTRAVENOUS

## 2018-12-03 MED ORDER — ALBUTEROL SULFATE (2.5 MG/3ML) 0.083% IN NEBU
5.0000 mg | INHALATION_SOLUTION | Freq: Once | RESPIRATORY_TRACT | Status: AC
  Administered 2018-12-03: 5 mg via RESPIRATORY_TRACT
  Filled 2018-12-03: qty 6

## 2018-12-03 NOTE — ED Provider Notes (Signed)
[facility] EMERGENCY DEPARTMENT Provider Note   CSN: [id number] Arrival date & time: 12/03/18  2132     History   Chief Complaint Chief Complaint  Patient presents with  . Shortness of Breath    HPI [name] is a 57 y.o. male.  He is presenting the emergency department complaining of acute onset of feeling hot and cold, nausea vomiting, shortness of breath.  He had a recent surgery about a week ago on his left heel for fracture.  He also had a biopsy skin on his left upper arm about 2 weeks ago.  He said he was doing well and felt great today and then acutely felt sick tonight.  No headache no sore throat no chest pain no abdominal pain no diarrhea or constipation no urinary symptoms.  No numbness or weakness.  The history is provided by the patient.  Shortness of Breath  This is a recurrent problem. The problem occurs intermittently.The current episode started 3 to 5 hours ago. The problem has not changed since onset.Associated symptoms include vomiting and rash. Pertinent negatives include no fever, no headaches, no rhinorrhea, no sore throat, no neck pain, no cough, no sputum production, no hemoptysis, no wheezing, no chest pain, no syncope, no abdominal pain, no leg pain and no leg swelling. It is unknown what precipitated the problem. Risk factors include recent leg injury. He has tried nothing for the symptoms. The treatment provided no relief.    Past Medical History:  Diagnosis Date  . Allergy   . Arthritis   . Calcaneus fracture, left   . Cancer [facility])    Skin cancer Left Arm  . GERD (gastroesophageal reflux disease)   . Small bowel obstruction [facility])     Patient Active Problem List   Diagnosis Date Noted  . Small bowel obstruction ([place]) 10/29/2014  . Esophageal reflux 10/29/2014  . Primary osteoarthritis of right knee 10/22/2014  . Inguinal hernia 11/23/2013    Past Surgical History:  Procedure Laterality Date  . HAND SURGERY Right    contracture of hand   . INGUINAL HERNIA REPAIR Right 11/23/2013   Procedure: HERNIA REPAIR INGUINAL ADULT;  Surgeon: Scherry Ran, MD;  Location: [facility];  Service: General;  Laterality: Right;  site-inguinal area  . OPEN REDUCTION, INTERNAL FIXATION (ORIF) CALCANEAL FRACTURE WITH FUSION Left 11/30/2018   Procedure: OPEN REDUCTION, INTERNAL FIXATION (ORIF) CALCANEAL FRACTURE WITH PERONEAL DEBRIDEMENT & REPAIR OF SUPERIOR PERONEAL RETINACULUM;  Surgeon: Erle Crocker, MD;  Location: [place];  Service: Orthopedics;  Laterality: Left;  Marland Kitchen VASECTOMY    . WRIST SURGERY Right    otif        Home Medications    Prior to Admission medications   Medication Sig Start Date End Date Taking? Authorizing Provider  albuterol (PROAIR HFA) 108 (90 Base) MCG/ACT inhaler Inhale 2 puffs into the lungs every 6 (six) hours as needed for wheezing or shortness of breath. 02/01/18   Dettinger, [name], MD  aspirin (BAYER ASPIRIN) 325 MG tablet Take 1 tablet (325 mg total) by mouth daily. 11/30/18 11/30/19  Erle Crocker, MD  cetirizine (ZYRTEC) 10 MG tablet Take 1 tablet (10 mg total) by mouth daily. Patient taking differently: Take 10 mg by mouth daily as needed for allergies.  10/12/18   Evelina Dun A, FNP  oxyCODONE (OXY IR/ROXICODONE) 5 MG immediate release tablet Take 1 tablet (5 mg total) by mouth every 4 (four) hours as needed for up to 7 days for  severe pain. 11/30/18 12/07/18  Erle Crocker, MD  pantoprazole (PROTONIX) 40 MG tablet TAKE ONE (1) TABLET EACH DAY Patient taking differently: Take 40 mg by mouth daily.  12/21/17   Dettinger, [name], MD    Family History Family History  Problem Relation Age of Onset  . Stroke Mother   . Cancer Mother        colon  . Alzheimer's disease Father        48 years  . Heart disease Father        atrial fibri  . Heart disease Sister   . Early death Brother   . Cancer Maternal Grandmother     Social History Social History   Tobacco Use  . Smoking  status: Never Smoker  . Smokeless tobacco: Never Used  Substance Use Topics  . Alcohol use: Yes    Alcohol/week: 4.0 standard drinks    Types: 4 Cans of beer per week    Comment: occ  . Drug use: No     Allergies   Ibuprofen; Morphine and related; and Bee venom   Review of Systems Review of Systems  Constitutional: Negative for fever.  HENT: Negative for rhinorrhea and sore throat.   Eyes: Negative for visual disturbance.  Respiratory: Positive for shortness of breath. Negative for cough, hemoptysis, sputum production and wheezing.   Cardiovascular: Negative for chest pain, leg swelling and syncope.  Gastrointestinal: Positive for vomiting. Negative for abdominal pain.  Genitourinary: Negative for dysuria.  Musculoskeletal: Negative for neck pain.  Skin: Positive for rash.  Neurological: Negative for headaches.     Physical Exam Updated Vital Signs BP (!) 159/90 (BP Location: Right Arm)   Pulse 85   Temp 98.6 F (37 C) (Oral)   Resp 20   Ht 5\' 5"  (1.651 m)   Wt 86.2 kg   SpO2 95%   BMI 31.62 kg/m   Physical Exam Vitals signs and nursing note reviewed.  Constitutional:      Appearance: He is well-developed.  HENT:     Head: Normocephalic and atraumatic.  Eyes:     Conjunctiva/sclera: Conjunctivae normal.  Neck:     Musculoskeletal: Neck supple.  Cardiovascular:     Rate and Rhythm: Normal rate and regular rhythm.     Heart sounds: No murmur.  Pulmonary:     Effort: Pulmonary effort is normal. No respiratory distress.     Breath sounds: Normal breath sounds.  Abdominal:     Palpations: Abdomen is soft.     Tenderness: There is no abdominal tenderness.  Musculoskeletal:     Right lower leg: He exhibits no tenderness. No edema.     Left lower leg: He exhibits no tenderness. No edema.     Comments: Left lower leg and short leg cast Left upper arm there is approximately 3 x 2 cm surgical excision with some yellow scab and granulating tissue at the base.   There is no surrounding erythema no discharge.  Skin:    General: Skin is warm.     Capillary Refill: Capillary refill takes less than 2 seconds.     Comments: He is got some diffuse erythema over his back.  He says it is itchy.  Neurological:     General: No focal deficit present.     Mental Status: He is alert and oriented to person, place, and time.     Motor: No weakness.      ED Treatments / Results  Labs (all labs ordered  are listed, but only abnormal results are displayed) Labs Reviewed  COMPREHENSIVE METABOLIC PANEL - Abnormal; Notable for the following components:      Result Value   Glucose, Bld 108 (*)    Calcium 8.8 (*)    All other components within normal limits  AEROBIC CULTURE (SUPERFICIAL SPECIMEN)  CBC WITH DIFFERENTIAL/PLATELET  TROPONIN I  INFLUENZA PANEL BY PCR (TYPE A & B)  LIPASE, BLOOD    EKG EKG Interpretation  Date/Time:  Sunday December 03 2018 21:41:35 EST Ventricular Rate:  103 PR Interval:  134 QRS Duration: 78 QT Interval:  322 QTC Calculation: 421 R Axis:   18 Text Interpretation:  Sinus tachycardia Otherwise normal ECG similar to prior 10/16 Confirmed by Aletta Edouard [phone]) on 12/03/2018 10:12:17 PM   Radiology Dg Chest 2 View  Result Date: 12/03/2018 CLINICAL DATA:  Acute onset of difficulty breathing. Diaphoresis. EXAM: CHEST - 2 VIEW COMPARISON:  Chest radiograph performed 10/12/2018 FINDINGS: The lungs are well-aerated and clear. There is no evidence of focal opacification, pleural effusion or pneumothorax. The heart is normal in size; the mediastinal contour is within normal limits. No acute osseous abnormalities are seen. IMPRESSION: No acute cardiopulmonary process seen. Electronically Signed   By: Garald Balding M.D.   On: 12/03/2018 22:58    Procedures Procedures (including critical care time)  Medications Ordered in ED Medications  iopamidol (ISOVUE-370) 76 % injection 100 mL (has no administration in time range)    albuterol (PROVENTIL) (2.5 MG/3ML) 0.083% nebulizer solution 5 mg (5 mg Nebulization Given 12/03/18 2202)     Initial Impression / Assessment and Plan / ED Course  I have reviewed the triage vital signs and the nursing notes.  Pertinent labs & imaging results that were available during my care of the patient were reviewed by me and considered in my medical decision making (see chart for details).  Clinical Course as of Dec 03 2346  Nancy Fetter Dec 03, 2018  2227 When I was evaluating the patient he was receiving a neb treatment and he said his shortness of breath was improved.   [MB]    Clinical Course User Index [MB] Hayden Rasmussen, MD   Patient was feeling a little bit better.  The fact of his recent surgery and put him in for CT PE.  I think if this is negative and he clinically feels improved he possibly can go home.  Signed out to my partner who will reevaluate once all of his testing is back.  Final Clinical Impressions(s) / ED Diagnoses   Final diagnoses:  None    ED Discharge Orders    None       Hayden Rasmussen, MD 12/03/18 2348

## 2018-12-03 NOTE — ED Triage Notes (Signed)
Pt has surgery on his foot Thursday. To fix broken heel.  Pt called ems for sweats and diff breathing started earlier tonight about an hour- hour and a half ago.  Took his pain med around 730

## 2018-12-03 NOTE — ED Notes (Signed)
Pt also has infected wound from skin cancer removal on left upper arm.

## 2018-12-04 MED ORDER — MUPIROCIN 2 % EX OINT: 1.0000 "application " | TOPICAL_OINTMENT | Freq: Two times a day (BID) | CUTANEOUS | 0 refills | Status: DC

## 2018-12-04 NOTE — ED Provider Notes (Signed)
Patient seen earlier today by Dr. Melina Copa for shortness of breath.  Patient had an episode earlier of onset of diaphoresis, nausea, vomiting and then felt short of breath.  Patient had left foot surgery and is in a cast, therefore PE was considered.  PE study, however, is negative.  All the remainder of his work-up was negative and he is at his current baseline.  Patient reports no current symptoms, feels fine, will therefore discharge.  Patient concerned about skin biopsy site on his left arm.  The area is dry with some slight marginal erythema, no drainage.  Will prescribe Bactroban.   Orpah Greek, MD 12/04/18 [phone]

## 2018-12-06 LAB — AEROBIC CULTURE W GRAM STAIN (SUPERFICIAL SPECIMEN)
Culture: NO GROWTH
Gram Stain: NONE SEEN
Special Requests: NORMAL

## 2018-12-13 DIAGNOSIS — S92002D Unspecified fracture of left calcaneus, subsequent encounter for fracture with routine healing: Secondary | ICD-10-CM | POA: Diagnosis not present

## 2018-12-29 DIAGNOSIS — S92002D Unspecified fracture of left calcaneus, subsequent encounter for fracture with routine healing: Secondary | ICD-10-CM | POA: Diagnosis not present

## 2019-01-24 DIAGNOSIS — S92002D Unspecified fracture of left calcaneus, subsequent encounter for fracture with routine healing: Secondary | ICD-10-CM | POA: Diagnosis not present

## 2019-03-05 ENCOUNTER — Other Ambulatory Visit: Payer: Self-pay | Admitting: Family Medicine

## 2019-03-05 DIAGNOSIS — K219 Gastro-esophageal reflux disease without esophagitis: Secondary | ICD-10-CM

## 2019-03-28 DIAGNOSIS — S92002D Unspecified fracture of left calcaneus, subsequent encounter for fracture with routine healing: Secondary | ICD-10-CM | POA: Diagnosis not present

## 2019-03-28 IMAGING — DX DG LUMBAR SPINE COMPLETE 4+V
5 series · 5 of 5 positions shown · non-contrast
Comparison: CT scan November 06, 2014

CLINICAL DATA: Pain after fall

EXAM:
LUMBAR SPINE - COMPLETE 4+ VIEW

[l-spine ap]
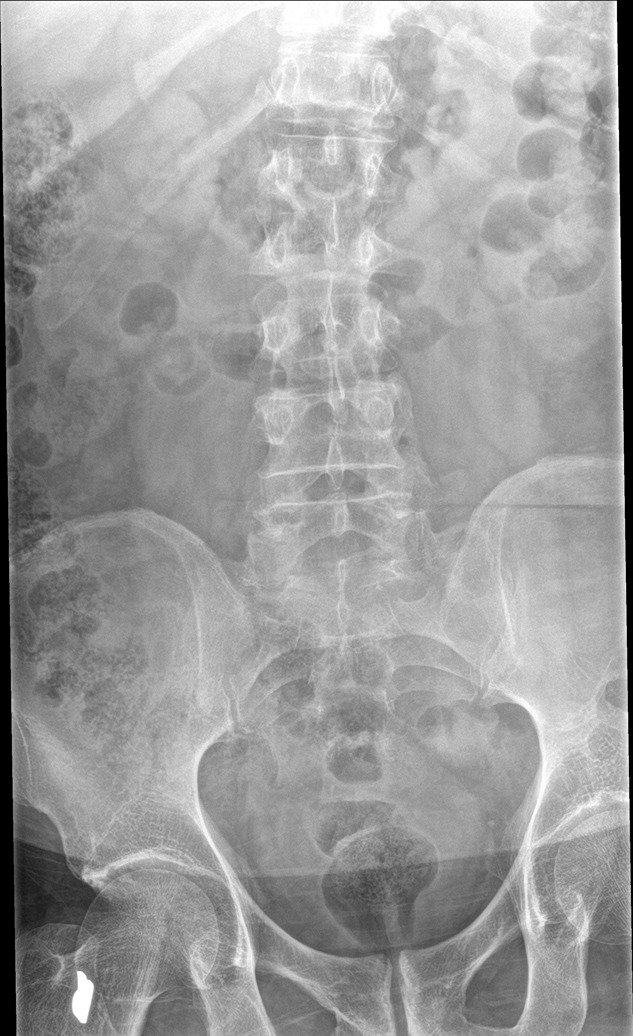

[l-spine obl (1 of 2)]
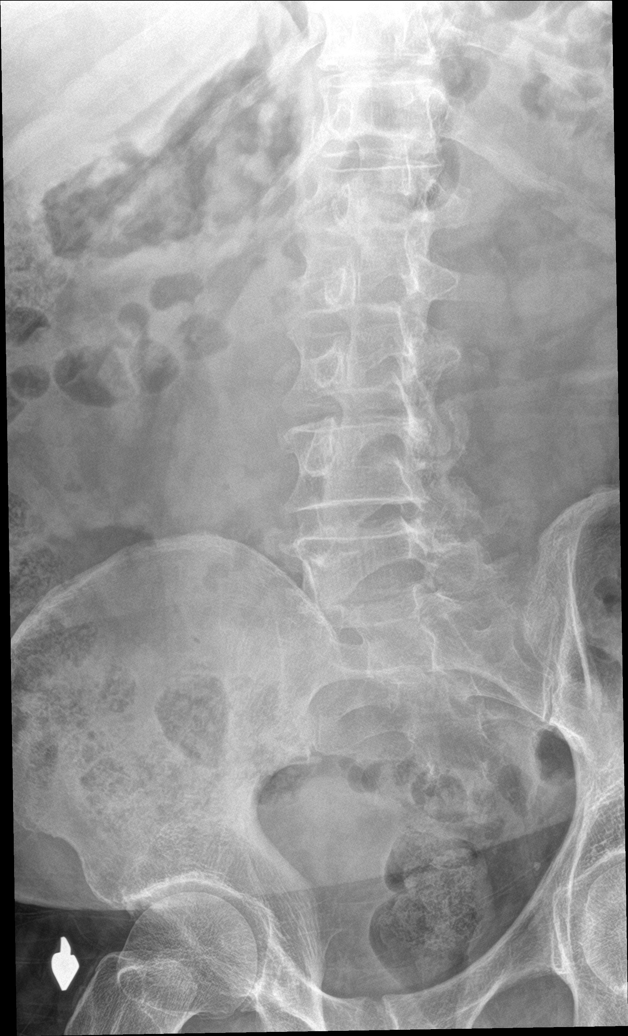

[l-spine obl (2 of 2)]
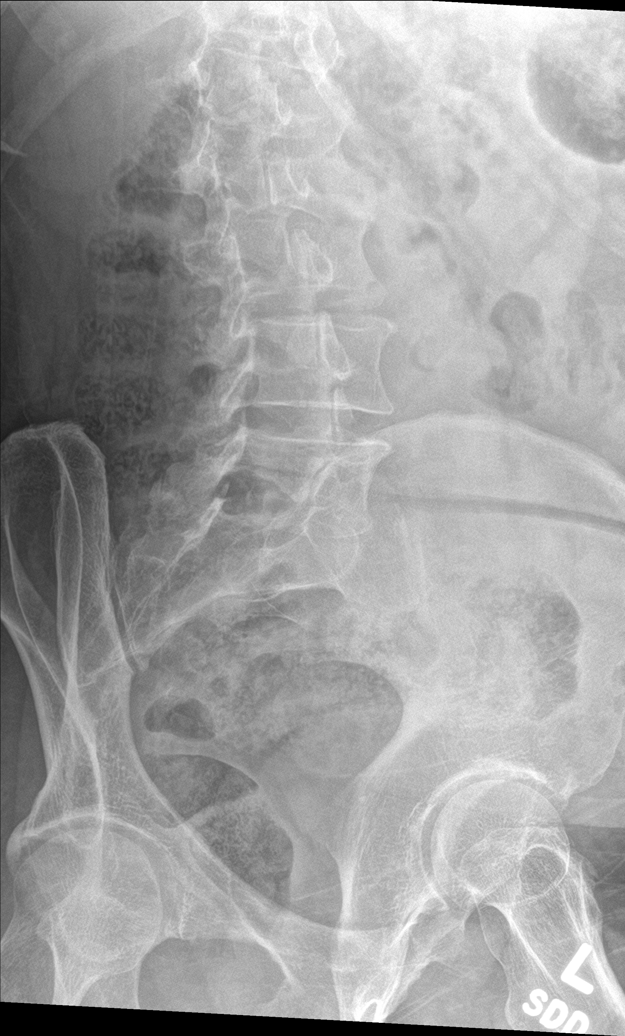

[l-spine spot]
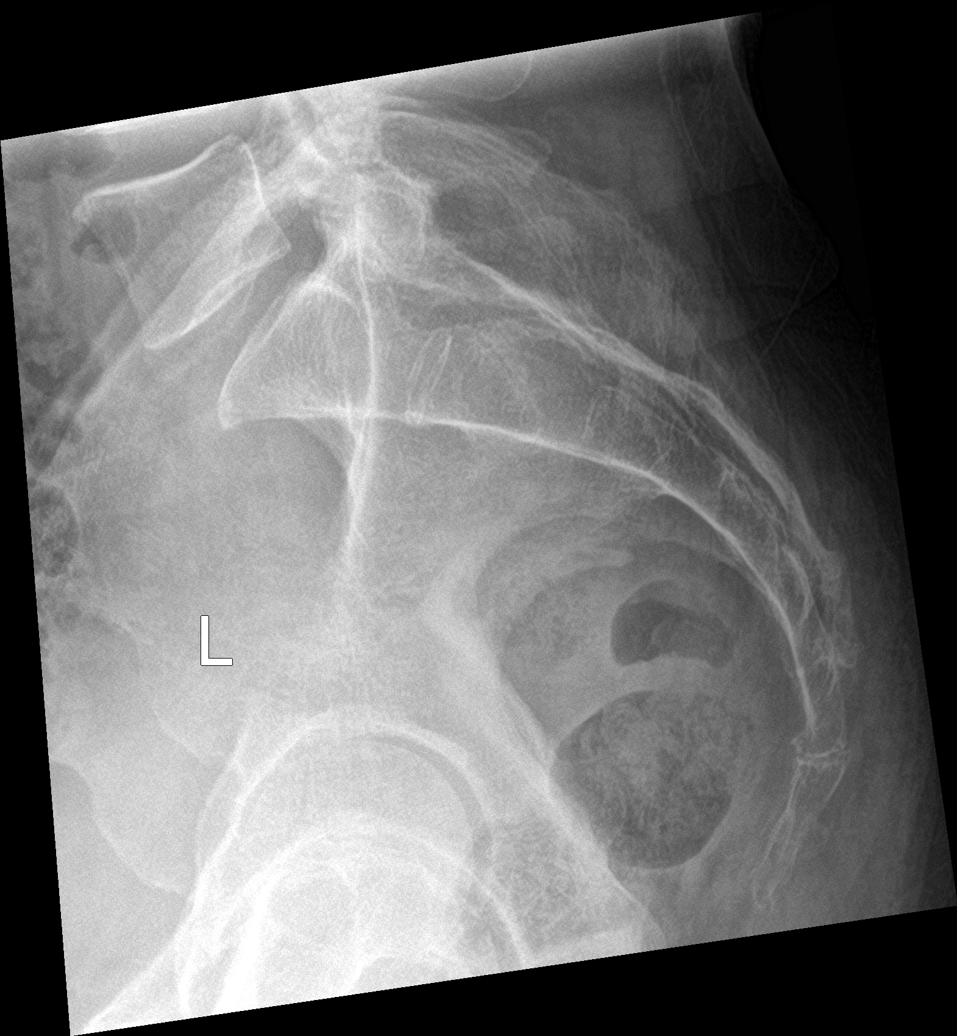

[l-spine lat]
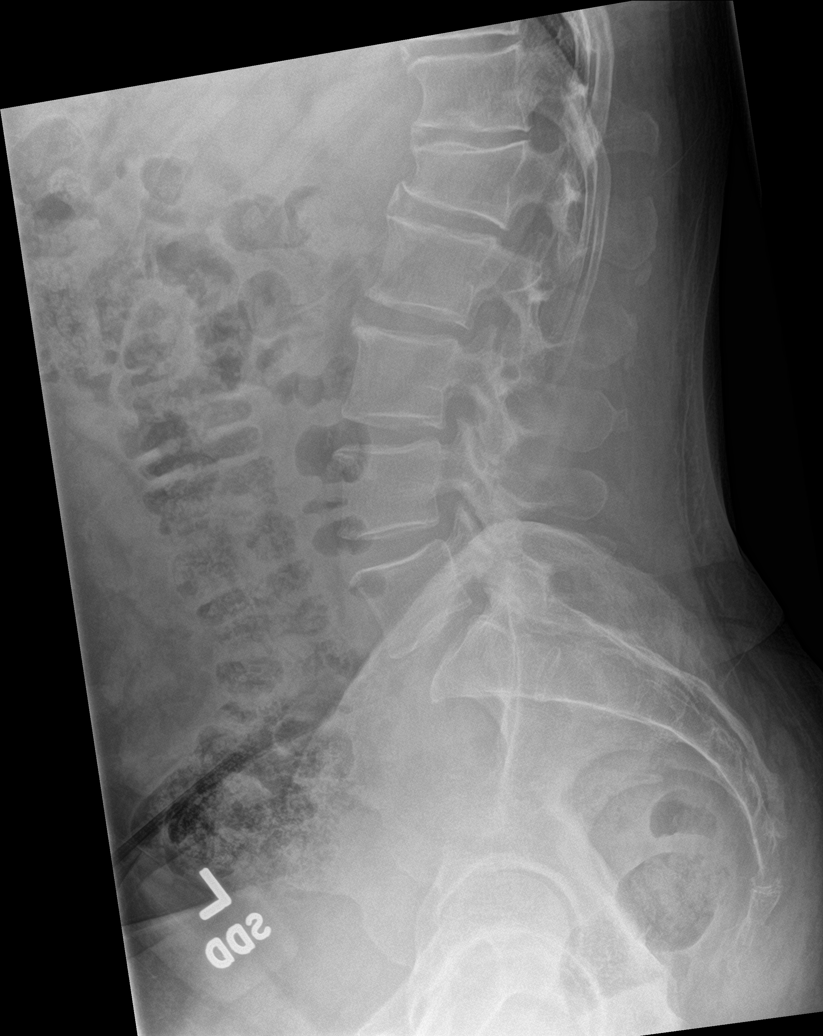

[5 of 5 positions shown; findings below may reference images not displayed]

FINDINGS: No inter pedicular widening. There is a compression fracture of L1
which is unchanged since November 2014 CT imaging. Irregularity of
the in inferior endplate of T12 and minimal anterior wedging of T12
is also unchanged since 3122. No acute fracture. No traumatic
malalignment. Mild multilevel degenerative disc disease.
IMPRESSION: No acute fracture or traumatic malalignment. Remote compression
fracture of L1.

## 2019-03-28 IMAGING — DX DG SHOULDER 2+V*L*
4 series · 4 of 4 positions shown · non-contrast
Comparison: None.

CLINICAL DATA: Pain after fall

EXAM:
LEFT SHOULDER - 2+ VIEW

[shoulder grashey]
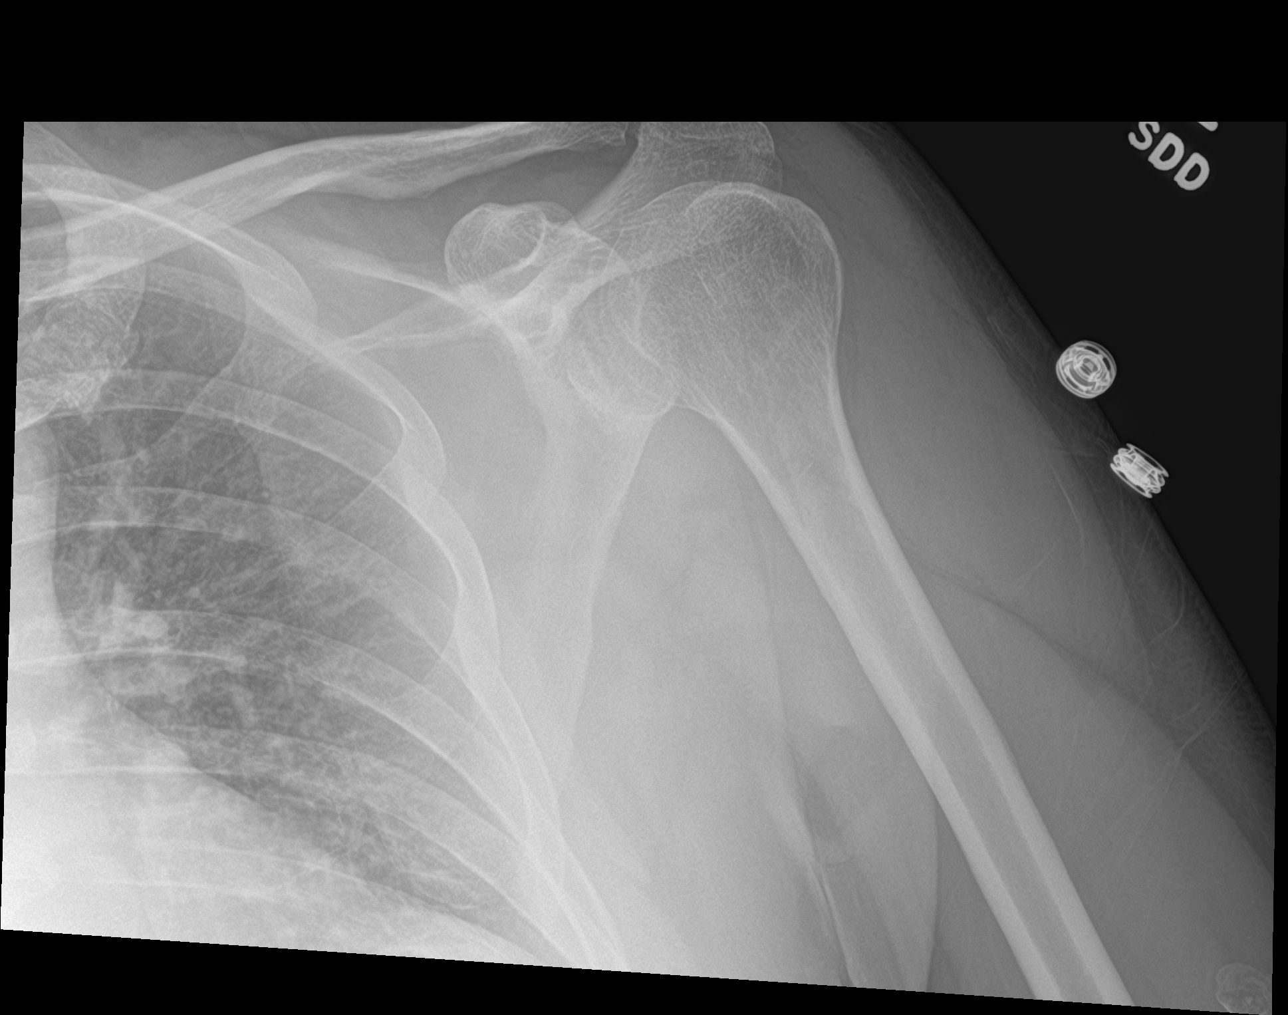

[shoulder y view (1 of 2)]
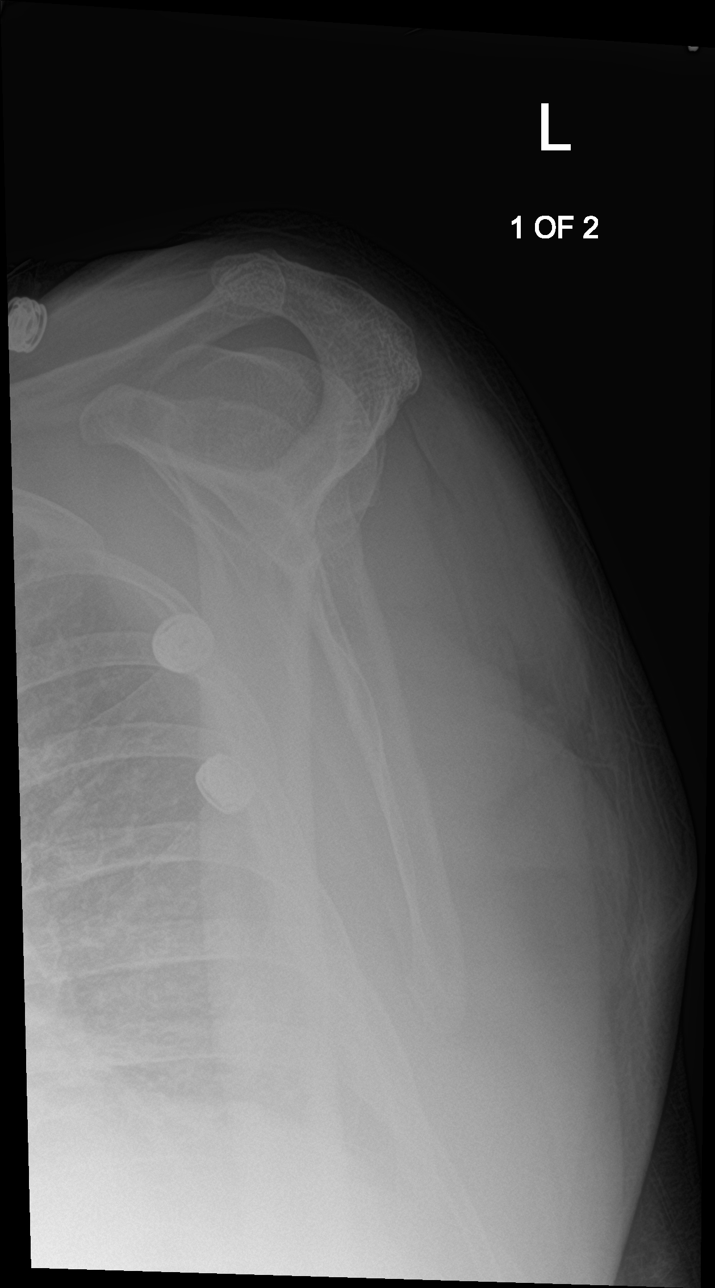

[shoulder axillary]
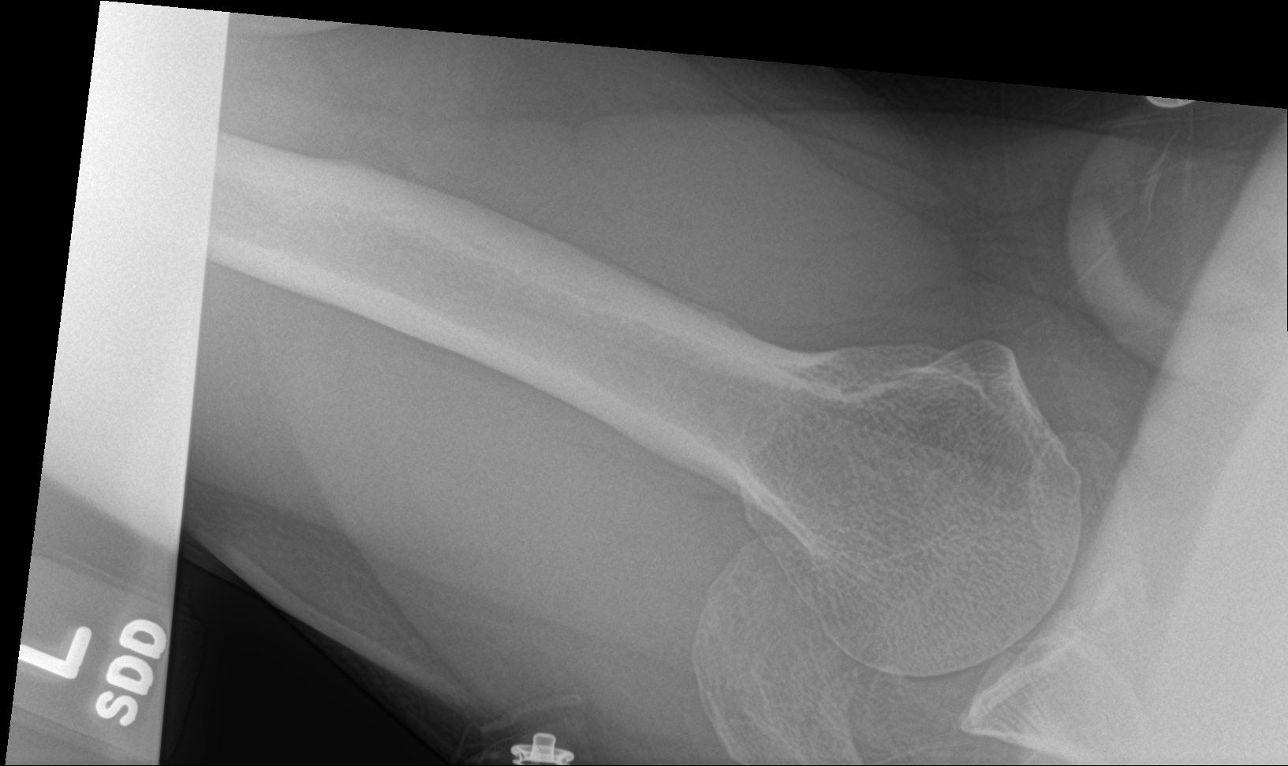

[shoulder y view (2 of 2)]
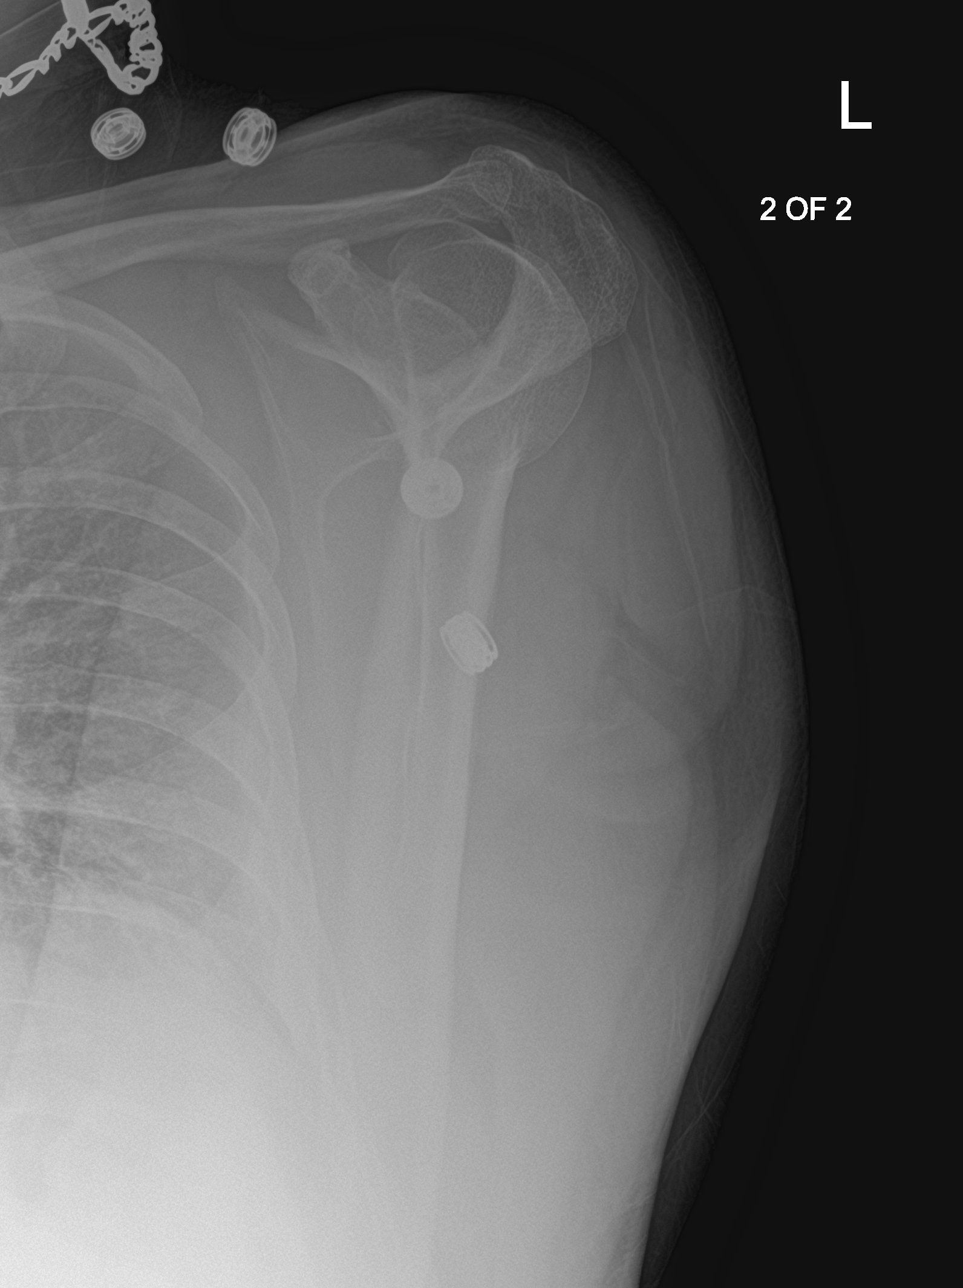

[4 of 4 positions shown; findings below may reference images not displayed]

FINDINGS: There is no evidence of fracture or dislocation. There is no
evidence of arthropathy or other focal bone abnormality. Soft
tissues are unremarkable.
IMPRESSION: Negative.

## 2019-05-31 ENCOUNTER — Other Ambulatory Visit: Payer: Self-pay | Admitting: Family Medicine

## 2019-05-31 DIAGNOSIS — R6889 Other general symptoms and signs: Secondary | ICD-10-CM

## 2019-05-31 DIAGNOSIS — J40 Bronchitis, not specified as acute or chronic: Secondary | ICD-10-CM

## 2019-07-16 DIAGNOSIS — R42 Dizziness and giddiness: Secondary | ICD-10-CM | POA: Diagnosis not present

## 2019-09-29 DIAGNOSIS — J0100 Acute maxillary sinusitis, unspecified: Secondary | ICD-10-CM | POA: Diagnosis not present

## 2019-09-29 DIAGNOSIS — J209 Acute bronchitis, unspecified: Secondary | ICD-10-CM | POA: Diagnosis not present

## 2019-09-29 DIAGNOSIS — R05 Cough: Secondary | ICD-10-CM | POA: Diagnosis not present

## 2019-11-05 DIAGNOSIS — R069 Unspecified abnormalities of breathing: Secondary | ICD-10-CM | POA: Diagnosis not present

## 2019-11-06 DIAGNOSIS — J029 Acute pharyngitis, unspecified: Secondary | ICD-10-CM | POA: Diagnosis not present

## 2019-11-06 DIAGNOSIS — R432 Parageusia: Secondary | ICD-10-CM | POA: Diagnosis not present

## 2019-11-06 DIAGNOSIS — R05 Cough: Secondary | ICD-10-CM | POA: Diagnosis not present

## 2019-11-06 DIAGNOSIS — J988 Other specified respiratory disorders: Secondary | ICD-10-CM | POA: Diagnosis not present

## 2019-11-14 ENCOUNTER — Other Ambulatory Visit: Payer: Self-pay | Admitting: Family Medicine

## 2019-11-14 DIAGNOSIS — K219 Gastro-esophageal reflux disease without esophagitis: Secondary | ICD-10-CM

## 2019-11-14 NOTE — Telephone Encounter (Signed)
Dettinger. NTBS LOV 11/08/18

## 2019-11-15 NOTE — Telephone Encounter (Signed)
lmtcb to schedule appt 

## 2019-11-16 ENCOUNTER — Other Ambulatory Visit: Payer: Self-pay

## 2019-11-16 ENCOUNTER — Ambulatory Visit (INDEPENDENT_AMBULATORY_CARE_PROVIDER_SITE_OTHER): Payer: BC Managed Care – PPO | Admitting: Family

## 2019-11-16 ENCOUNTER — Encounter: Payer: Self-pay | Admitting: Family

## 2019-11-16 DIAGNOSIS — B9689 Other specified bacterial agents as the cause of diseases classified elsewhere: Secondary | ICD-10-CM | POA: Diagnosis not present

## 2019-11-16 DIAGNOSIS — J208 Acute bronchitis due to other specified organisms: Secondary | ICD-10-CM

## 2019-11-16 MED ORDER — DOXYCYCLINE HYCLATE 100 MG PO TABS: 100.0000 mg | ORAL_TABLET | Freq: Two times a day (BID) | ORAL | 0 refills | Status: DC

## 2019-11-16 MED ORDER — BENZONATATE 200 MG PO CAPS: 200.0000 mg | ORAL_CAPSULE | Freq: Three times a day (TID) | ORAL | 1 refills | Status: DC | PRN

## 2019-11-16 NOTE — Progress Notes (Signed)
Virtual Visit via telephone Note Due to COVID-19 pandemic this visit was conducted virtually. This visit type was conducted due to national recommendations for restrictions regarding the COVID-19 Pandemic (e.g. social distancing, sheltering in place) in an effort to limit this patient's exposure and mitigate transmission in our community. All issues noted in this document were discussed and addressed.  A physical exam was not performed with this format.  I connected with [name] on 11/16/19 at 2:58 pm by telephone and verified that I am speaking with the correct person using two identifiers. [name] is currently located at home and no one is currently with him during visit. The provider, Evelina Dun, FNP is located in their office at time of visit.  I discussed the limitations, risks, security and privacy concerns of performing an evaluation and management service by telephone and the availability of in person appointments. I also discussed with the patient that there may be a patient responsible charge related to this service. The patient expressed understanding and agreed to proceed.   History and Present Illness:  Pt calls the office today with recurrent cough that started a month ago. He states he has been tested for COVID twice and was negative. He reports he was given prednisone that helped mildly.  Cough This is a new problem. The current episode started more than 1 month ago. The problem has been waxing and waning. The problem occurs every few minutes. The cough is non-productive. Associated symptoms include chills, a fever ("off and on"), headaches, myalgias, a sore throat, shortness of breath and wheezing. Pertinent negatives include no ear congestion, ear pain, nasal congestion, postnasal drip or rhinorrhea. He has tried OTC inhaler, rest and oral steroids for the symptoms. The treatment provided mild relief.      Review of Systems  Constitutional: Positive for  chills and fever ("off and on").  HENT: Positive for sore throat. Negative for ear pain, postnasal drip and rhinorrhea.   Respiratory: Positive for cough, shortness of breath and wheezing.   Musculoskeletal: Positive for myalgias.  Neurological: Positive for headaches.     Observations/Objective: No SOB or distress noted  Assessment and Plan: 1. Acute bacterial bronchitis - Take meds as prescribed - Use a cool mist humidifier  -Use saline nose sprays frequently -Force fluids -For any cough or congestion  Use plain Mucinex- regular strength or max strength is fine -For fever or aces or pains- take tylenol or ibuprofen. -Throat lozenges if helps RTO if symptoms worsen or do not improve  - doxycycline (VIBRA-TABS) 100 MG tablet; Take 1 tablet (100 mg total) by mouth 2 (two) times daily.  Dispense: 20 tablet; Refill: 0 - benzonatate (TESSALON) 200 MG capsule; Take 1 capsule (200 mg total) by mouth 3 (three) times daily as needed.  Dispense: 30 capsule; Refill: 1    I discussed the assessment and treatment plan with the patient. The patient was provided an opportunity to ask questions and all were answered. The patient agreed with the plan and demonstrated an understanding of the instructions.   The patient was advised to call back or seek an in-person evaluation if the symptoms worsen or if the condition fails to improve as anticipated.  The above assessment and management plan was discussed with the patient. The patient verbalized understanding of and has agreed to the management plan. Patient is aware to call the clinic if symptoms persist or worsen. Patient is aware when to return to the clinic for a follow-up visit.  Patient educated on when it is appropriate to go to the emergency department.   Time call ended:  3:07 pm  I provided 9 minutes of non-face-to-face time during this encounter.    Evelina Dun, FNP

## 2019-11-18 DIAGNOSIS — R509 Fever, unspecified: Secondary | ICD-10-CM | POA: Diagnosis not present

## 2019-11-18 DIAGNOSIS — R0602 Shortness of breath: Secondary | ICD-10-CM | POA: Diagnosis not present

## 2019-11-18 DIAGNOSIS — R110 Nausea: Secondary | ICD-10-CM | POA: Diagnosis not present

## 2019-11-18 DIAGNOSIS — R05 Cough: Secondary | ICD-10-CM | POA: Diagnosis not present

## 2019-11-18 DIAGNOSIS — R000 Tachycardia, unspecified: Secondary | ICD-10-CM | POA: Diagnosis not present

## 2019-11-19 ENCOUNTER — Emergency Department (HOSPITAL_COMMUNITY): Payer: BC Managed Care – PPO

## 2019-11-19 ENCOUNTER — Encounter (HOSPITAL_COMMUNITY): Payer: Self-pay | Admitting: Emergency Medicine

## 2019-11-19 ENCOUNTER — Inpatient Hospital Stay (HOSPITAL_COMMUNITY)
Admission: EM | Admit: 2019-11-19 | Discharge: 2019-11-23 | DRG: 871 | Disposition: A | Discharge status: Home / Self Care | Payer: BC Managed Care – PPO | Attending: Internal Medicine | Admitting: Internal Medicine

## 2019-11-19 ENCOUNTER — Other Ambulatory Visit: Payer: Self-pay

## 2019-11-19 DIAGNOSIS — R7303 Prediabetes: Secondary | ICD-10-CM | POA: Diagnosis present

## 2019-11-19 DIAGNOSIS — J9601 Acute respiratory failure with hypoxia: Secondary | ICD-10-CM | POA: Diagnosis present

## 2019-11-19 DIAGNOSIS — R739 Hyperglycemia, unspecified: Secondary | ICD-10-CM | POA: Diagnosis not present

## 2019-11-19 DIAGNOSIS — Z209 Contact with and (suspected) exposure to unspecified communicable disease: Secondary | ICD-10-CM | POA: Diagnosis not present

## 2019-11-19 DIAGNOSIS — Z85828 Personal history of other malignant neoplasm of skin: Secondary | ICD-10-CM

## 2019-11-19 DIAGNOSIS — Z823 Family history of stroke: Secondary | ICD-10-CM

## 2019-11-19 DIAGNOSIS — R0902 Hypoxemia: Secondary | ICD-10-CM

## 2019-11-19 DIAGNOSIS — J1289 Other viral pneumonia: Secondary | ICD-10-CM | POA: Diagnosis not present

## 2019-11-19 DIAGNOSIS — R0602 Shortness of breath: Secondary | ICD-10-CM | POA: Diagnosis not present

## 2019-11-19 DIAGNOSIS — Z820 Family history of epilepsy and other diseases of the nervous system: Secondary | ICD-10-CM | POA: Diagnosis not present

## 2019-11-19 DIAGNOSIS — R52 Pain, unspecified: Secondary | ICD-10-CM | POA: Diagnosis not present

## 2019-11-19 DIAGNOSIS — G4700 Insomnia, unspecified: Secondary | ICD-10-CM | POA: Diagnosis not present

## 2019-11-19 DIAGNOSIS — M2550 Pain in unspecified joint: Secondary | ICD-10-CM | POA: Diagnosis not present

## 2019-11-19 DIAGNOSIS — Z79899 Other long term (current) drug therapy: Secondary | ICD-10-CM

## 2019-11-19 DIAGNOSIS — J069 Acute upper respiratory infection, unspecified: Secondary | ICD-10-CM | POA: Diagnosis present

## 2019-11-19 DIAGNOSIS — Z8249 Family history of ischemic heart disease and other diseases of the circulatory system: Secondary | ICD-10-CM

## 2019-11-19 DIAGNOSIS — J1282 Pneumonia due to coronavirus disease 2019: Secondary | ICD-10-CM

## 2019-11-19 DIAGNOSIS — R509 Fever, unspecified: Secondary | ICD-10-CM | POA: Diagnosis not present

## 2019-11-19 DIAGNOSIS — R05 Cough: Secondary | ICD-10-CM | POA: Diagnosis not present

## 2019-11-19 DIAGNOSIS — Z7982 Long term (current) use of aspirin: Secondary | ICD-10-CM

## 2019-11-19 DIAGNOSIS — A419 Sepsis, unspecified organism: Secondary | ICD-10-CM | POA: Diagnosis not present

## 2019-11-19 DIAGNOSIS — M1990 Unspecified osteoarthritis, unspecified site: Secondary | ICD-10-CM | POA: Diagnosis present

## 2019-11-19 DIAGNOSIS — T380X5A Adverse effect of glucocorticoids and synthetic analogues, initial encounter: Secondary | ICD-10-CM | POA: Diagnosis not present

## 2019-11-19 DIAGNOSIS — K219 Gastro-esophageal reflux disease without esophagitis: Secondary | ICD-10-CM | POA: Diagnosis not present

## 2019-11-19 DIAGNOSIS — U071 COVID-19: Secondary | ICD-10-CM

## 2019-11-19 DIAGNOSIS — Z8589 Personal history of malignant neoplasm of other organs and systems: Secondary | ICD-10-CM

## 2019-11-19 DIAGNOSIS — J309 Allergic rhinitis, unspecified: Secondary | ICD-10-CM | POA: Diagnosis not present

## 2019-11-19 DIAGNOSIS — J189 Pneumonia, unspecified organism: Secondary | ICD-10-CM | POA: Insufficient documentation

## 2019-11-19 DIAGNOSIS — R531 Weakness: Secondary | ICD-10-CM | POA: Diagnosis not present

## 2019-11-19 DIAGNOSIS — R000 Tachycardia, unspecified: Secondary | ICD-10-CM | POA: Diagnosis not present

## 2019-11-19 DIAGNOSIS — A4189 Other specified sepsis: Principal | ICD-10-CM | POA: Diagnosis present

## 2019-11-19 DIAGNOSIS — Z7401 Bed confinement status: Secondary | ICD-10-CM | POA: Diagnosis not present

## 2019-11-19 DIAGNOSIS — R6520 Severe sepsis without septic shock: Secondary | ICD-10-CM | POA: Diagnosis not present

## 2019-11-19 DIAGNOSIS — Z885 Allergy status to narcotic agent status: Secondary | ICD-10-CM

## 2019-11-19 LAB — PROTIME-INR
INR: 1 (ref 0.8–1.2)
Prothrombin Time: 13.1 seconds (ref 11.4–15.2)

## 2019-11-19 LAB — LACTIC ACID, PLASMA
Lactic Acid, Venous: 2.1 mmol/L (ref 0.5–1.9)
Lactic Acid, Venous: 1.8 mmol/L (ref 0.5–1.9)

## 2019-11-19 LAB — C-REACTIVE PROTEIN: CRP: 0.7 mg/dL (ref <1.0)

## 2019-11-19 LAB — URINALYSIS, ROUTINE W REFLEX MICROSCOPIC
Bacteria, UA: NONE SEEN
Bilirubin Urine: NEGATIVE
Glucose, UA: NEGATIVE mg/dL
Hgb urine dipstick: NEGATIVE
Ketones, ur: NEGATIVE mg/dL
Leukocytes,Ua: NEGATIVE
Nitrite: NEGATIVE
Protein, ur: 30 mg/dL — AB
Specific Gravity, Urine: 1.027 (ref 1.005–1.030)
pH: 5 (ref 5.0–8.0)

## 2019-11-19 LAB — APTT: aPTT: 26 seconds (ref 24–36)

## 2019-11-19 LAB — FERRITIN: Ferritin: 107 ng/mL (ref 24–336)

## 2019-11-19 LAB — CBC WITH DIFFERENTIAL/PLATELET
Abs Immature Granulocytes: 0.03 10*3/uL (ref 0.00–0.07)
Basophils Absolute: 0 10*3/uL (ref 0.0–0.1)
Basophils Relative: 0 %
Eosinophils Absolute: 0 10*3/uL (ref 0.0–0.5)
Eosinophils Relative: 0 %
HCT: 48.1 % (ref 39.0–52.0)
Hemoglobin: 16.6 g/dL (ref 13.0–17.0)
Immature Granulocytes: 0 %
Lymphocytes Relative: 7 %
Lymphs Abs: 0.8 10*3/uL (ref 0.7–4.0)
MCH: 33.3 pg (ref 26.0–34.0)
MCHC: 34.5 g/dL (ref 30.0–36.0)
MCV: 96.4 fL (ref 80.0–100.0)
Monocytes Absolute: 0.7 10*3/uL (ref 0.1–1.0)
Monocytes Relative: 6 %
Neutro Abs: 9.8 10*3/uL — ABNORMAL HIGH (ref 1.7–7.7)
Neutrophils Relative %: 87 %
Platelets: 209 10*3/uL (ref 150–400)
RBC: 4.99 MIL/uL (ref 4.22–5.81)
RDW: 12.3 % (ref 11.5–15.5)
WBC: 11.4 10*3/uL — ABNORMAL HIGH (ref 4.0–10.5)
nRBC: 0 % (ref 0.0–0.2)

## 2019-11-19 LAB — COMPREHENSIVE METABOLIC PANEL
ALT: 23 U/L (ref 0–44)
AST: 19 U/L (ref 15–41)
Albumin: 3.7 g/dL (ref 3.5–5.0)
Alkaline Phosphatase: 72 U/L (ref 38–126)
Anion gap: 9 (ref 5–15)
BUN: 17 mg/dL (ref 6–20)
CO2: 25 mmol/L (ref 22–32)
Calcium: 8.5 mg/dL — ABNORMAL LOW (ref 8.9–10.3)
Chloride: 101 mmol/L (ref 98–111)
Creatinine, Ser: 1 mg/dL (ref 0.61–1.24)
GFR calc Af Amer: >60 mL/min (ref >60)
GFR calc non Af Amer: >60 mL/min (ref >60)
Glucose, Bld: 124 mg/dL — ABNORMAL HIGH (ref 70–99)
Potassium: 3.7 mmol/L (ref 3.5–5.1)
Sodium: 135 mmol/L (ref 135–145)
Total Bilirubin: 0.5 mg/dL (ref 0.3–1.2)
Total Protein: 7.1 g/dL (ref 6.5–8.1)

## 2019-11-19 LAB — D-DIMER, QUANTITATIVE: D-Dimer, Quant: 0.46 ug/mL-FEU (ref 0.00–0.50)

## 2019-11-19 LAB — HIV ANTIBODY (ROUTINE TESTING W REFLEX): HIV Screen 4th Generation wRfx: NONREACTIVE

## 2019-11-19 LAB — PROCALCITONIN: Procalcitonin: <0.1 ng/mL

## 2019-11-19 LAB — FIBRINOGEN: Fibrinogen: 532 mg/dL — ABNORMAL HIGH (ref 210–475)

## 2019-11-19 LAB — POC SARS CORONAVIRUS 2 AG -  ED: SARS Coronavirus 2 Ag: POSITIVE — AB

## 2019-11-19 LAB — LACTATE DEHYDROGENASE: LDH: 139 U/L (ref 98–192)

## 2019-11-19 MED ORDER — ALBUTEROL SULFATE (2.5 MG/3ML) 0.083% IN NEBU: 2.5000 mg | INHALATION_SOLUTION | RESPIRATORY_TRACT | Status: DC | PRN

## 2019-11-19 MED ORDER — ENOXAPARIN SODIUM 40 MG/0.4ML ~~LOC~~ SOLN
40.0000 mg | SUBCUTANEOUS | Status: DC
  Administered 2019-11-20 – 2019-11-22 (×4): 40 mg via SUBCUTANEOUS
  Filled 2019-11-19 (×4): qty 0.4

## 2019-11-19 MED ORDER — MUPIROCIN 2 % EX OINT
1.0000 "application " | TOPICAL_OINTMENT | Freq: Two times a day (BID) | CUTANEOUS | Status: DC
  Administered 2019-11-21: 1 via TOPICAL
  Filled 2019-11-19: qty 22

## 2019-11-19 MED ORDER — SODIUM CHLORIDE 0.9% FLUSH: 3.0000 mL | INTRAVENOUS | Status: DC | PRN

## 2019-11-19 MED ORDER — METHYLPREDNISOLONE SODIUM SUCC 125 MG IJ SOLR
125.0000 mg | Freq: Once | INTRAMUSCULAR | Status: AC
  Administered 2019-11-19: 125 mg via INTRAVENOUS
  Filled 2019-11-19: qty 2

## 2019-11-19 MED ORDER — SODIUM CHLORIDE 0.9 % IV SOLN
500.0000 mg | INTRAVENOUS | Status: DC
  Administered 2019-11-19: 500 mg via INTRAVENOUS
  Filled 2019-11-19: qty 500

## 2019-11-19 MED ORDER — SODIUM CHLORIDE 0.9 % IV SOLN: 250.0000 mL | INTRAVENOUS | Status: DC | PRN

## 2019-11-19 MED ORDER — ALBUTEROL SULFATE HFA 108 (90 BASE) MCG/ACT IN AERS
2.0000 | INHALATION_SPRAY | RESPIRATORY_TRACT | Status: DC | PRN
  Filled 2019-11-19: qty 6.7

## 2019-11-19 MED ORDER — PANTOPRAZOLE SODIUM 40 MG PO TBEC
40.0000 mg | DELAYED_RELEASE_TABLET | Freq: Every day | ORAL | Status: DC
  Administered 2019-11-20 – 2019-11-23 (×4): 40 mg via ORAL
  Filled 2019-11-19 (×4): qty 1

## 2019-11-19 MED ORDER — ZINC SULFATE 220 (50 ZN) MG PO CAPS
220.0000 mg | ORAL_CAPSULE | Freq: Every day | ORAL | Status: DC
  Administered 2019-11-19 – 2019-11-23 (×5): 220 mg via ORAL
  Filled 2019-11-19 (×5): qty 1

## 2019-11-19 MED ORDER — INSULIN ASPART 100 UNIT/ML ~~LOC~~ SOLN
0.0000 [IU] | Freq: Three times a day (TID) | SUBCUTANEOUS | Status: DC
  Administered 2019-11-20: 1 [IU] via SUBCUTANEOUS
  Filled 2019-11-19: qty 1

## 2019-11-19 MED ORDER — BENZONATATE 100 MG PO CAPS: 200.0000 mg | ORAL_CAPSULE | Freq: Three times a day (TID) | ORAL | Status: DC | PRN

## 2019-11-19 MED ORDER — LORATADINE 10 MG PO TABS
10.0000 mg | ORAL_TABLET | Freq: Every day | ORAL | Status: DC
  Administered 2019-11-20 – 2019-11-23 (×4): 10 mg via ORAL
  Filled 2019-11-19 (×4): qty 1

## 2019-11-19 MED ORDER — TRAZODONE HCL 50 MG PO TABS: 50.0000 mg | ORAL_TABLET | Freq: Every evening | ORAL | Status: DC | PRN

## 2019-11-19 MED ORDER — ASPIRIN 325 MG PO TABS
325.0000 mg | ORAL_TABLET | Freq: Every day | ORAL | Status: DC
  Administered 2019-11-20 – 2019-11-23 (×4): 325 mg via ORAL
  Filled 2019-11-19 (×4): qty 1

## 2019-11-19 MED ORDER — ONDANSETRON HCL 4 MG/2ML IJ SOLN
4.0000 mg | Freq: Four times a day (QID) | INTRAMUSCULAR | Status: DC | PRN
  Administered 2019-11-19: 4 mg via INTRAVENOUS
  Filled 2019-11-19: qty 2

## 2019-11-19 MED ORDER — INSULIN ASPART 100 UNIT/ML ~~LOC~~ SOLN: 0.0000 [IU] | Freq: Every day | SUBCUTANEOUS | Status: DC

## 2019-11-19 MED ORDER — FOLIC ACID 1 MG PO TABS
1.0000 mg | ORAL_TABLET | Freq: Every day | ORAL | Status: DC
  Administered 2019-11-19 – 2019-11-23 (×5): 1 mg via ORAL
  Filled 2019-11-19 (×5): qty 1

## 2019-11-19 MED ORDER — SODIUM CHLORIDE 0.9 % IV SOLN
2.0000 g | INTRAVENOUS | Status: DC
  Administered 2019-11-19: 2 g via INTRAVENOUS
  Filled 2019-11-19: qty 20

## 2019-11-19 MED ORDER — ACETAMINOPHEN 650 MG RE SUPP: 650.0000 mg | Freq: Four times a day (QID) | RECTAL | Status: DC | PRN

## 2019-11-19 MED ORDER — METHYLPREDNISOLONE SODIUM SUCC 40 MG IJ SOLR
40.0000 mg | Freq: Two times a day (BID) | INTRAMUSCULAR | Status: DC
  Administered 2019-11-19 – 2019-11-21 (×4): 40 mg via INTRAVENOUS
  Filled 2019-11-19 (×3): qty 1

## 2019-11-19 MED ORDER — ACETAMINOPHEN 500 MG PO TABS
1000.0000 mg | ORAL_TABLET | Freq: Once | ORAL | Status: AC
  Administered 2019-11-19: 1000 mg via ORAL
  Filled 2019-11-19: qty 2

## 2019-11-19 MED ORDER — THIAMINE HCL 100 MG PO TABS
100.0000 mg | ORAL_TABLET | Freq: Every day | ORAL | Status: DC
  Administered 2019-11-19 – 2019-11-23 (×5): 100 mg via ORAL
  Filled 2019-11-19 (×5): qty 1

## 2019-11-19 MED ORDER — ALBUTEROL SULFATE HFA 108 (90 BASE) MCG/ACT IN AERS
2.0000 | INHALATION_SPRAY | Freq: Four times a day (QID) | RESPIRATORY_TRACT | Status: DC
  Administered 2019-11-19 – 2019-11-21 (×6): 2 via RESPIRATORY_TRACT
  Filled 2019-11-19: qty 6.7

## 2019-11-19 MED ORDER — ONDANSETRON HCL 4 MG PO TABS: 4.0000 mg | ORAL_TABLET | Freq: Four times a day (QID) | ORAL | Status: DC | PRN

## 2019-11-19 MED ORDER — POLYETHYLENE GLYCOL 3350 17 G PO PACK
17.0000 g | PACK | Freq: Every day | ORAL | Status: DC | PRN
  Administered 2019-11-23: 17 g via ORAL
  Filled 2019-11-19: qty 1

## 2019-11-19 MED ORDER — ACETAMINOPHEN 325 MG PO TABS: 650.0000 mg | ORAL_TABLET | Freq: Four times a day (QID) | ORAL | Status: DC | PRN

## 2019-11-19 MED ORDER — METHYLPREDNISOLONE SODIUM SUCC 125 MG IJ SOLR: 0.5000 mg/kg | Freq: Two times a day (BID) | INTRAMUSCULAR | Status: DC

## 2019-11-19 MED ORDER — GUAIFENESIN-DM 100-10 MG/5ML PO SYRP: 10.0000 mL | ORAL_SOLUTION | ORAL | Status: DC | PRN

## 2019-11-19 MED ORDER — SODIUM CHLORIDE 0.9% FLUSH
3.0000 mL | Freq: Two times a day (BID) | INTRAVENOUS | Status: DC
  Administered 2019-11-20 – 2019-11-21 (×3): 3 mL via INTRAVENOUS

## 2019-11-19 MED ORDER — GUAIFENESIN ER 600 MG PO TB12
600.0000 mg | ORAL_TABLET | Freq: Two times a day (BID) | ORAL | Status: DC
  Administered 2019-11-19 – 2019-11-23 (×8): 600 mg via ORAL
  Filled 2019-11-19 (×8): qty 1

## 2019-11-19 MED ORDER — HYDROCOD POLST-CPM POLST ER 10-8 MG/5ML PO SUER: 5.0000 mL | Freq: Two times a day (BID) | ORAL | Status: DC | PRN

## 2019-11-19 MED ORDER — ADULT MULTIVITAMIN W/MINERALS CH
1.0000 | ORAL_TABLET | Freq: Every day | ORAL | Status: DC
  Administered 2019-11-19 – 2019-11-23 (×5): 1 via ORAL
  Filled 2019-11-19 (×5): qty 1

## 2019-11-19 MED ORDER — SODIUM CHLORIDE 0.9 % IV SOLN
100.0000 mg | Freq: Every day | INTRAVENOUS | Status: AC
  Administered 2019-11-20 – 2019-11-23 (×4): 100 mg via INTRAVENOUS
  Filled 2019-11-19 (×5): qty 20

## 2019-11-19 MED ORDER — ASCORBIC ACID 500 MG PO TABS
500.0000 mg | ORAL_TABLET | Freq: Every day | ORAL | Status: DC
  Administered 2019-11-19 – 2019-11-23 (×5): 500 mg via ORAL
  Filled 2019-11-19 (×5): qty 1

## 2019-11-19 MED ORDER — SODIUM CHLORIDE 0.9 % IV SOLN
200.0000 mg | Freq: Once | INTRAVENOUS | Status: AC
  Administered 2019-11-20: 200 mg via INTRAVENOUS
  Filled 2019-11-19: qty 40

## 2019-11-19 NOTE — ED Provider Notes (Signed)
[facility] EMERGENCY DEPARTMENT Provider Note   CSN: [id number] Arrival date & time: 11/19/19  1556     History Chief Complaint  Patient presents with  . Fever    [name] is a 58 y.o. male.  Patient is a 58 year old male with past medical history of GERD, prior small bowel obstruction, arthritis.  He presents today for evaluation of fever and cough.  Patient has been present for the past several weeks and worsening.  Patient describes nonproductive cough, body aches, fevers to 102, and feeling generally unwell.  He has been seen on 2 prior occasions and has had 2 prior Covid test, both of which have been negative.  He was seen at the doctor's office yesterday and started on doxycycline and steroids, however symptoms worsened today.  He denies any ill contacts.  The history is provided by the patient.  Fever Max temp prior to arrival:  102 Temp source:  Oral Severity:  Moderate Duration:  3 weeks Timing:  Constant Progression:  Worsening Chronicity:  New Relieved by:  Nothing Worsened by:  Nothing Ineffective treatments:  None tried Associated symptoms: congestion, cough and sore throat   Associated symptoms: no chest pain        Past Medical History:  Diagnosis Date  . Allergy   . Arthritis   . Calcaneus fracture, left   . Cancer [facility])    Skin cancer Left Arm  . GERD (gastroesophageal reflux disease)   . Small bowel obstruction [facility])     Patient Active Problem List   Diagnosis Date Noted  . Small bowel obstruction ([place]) 10/29/2014  . Esophageal reflux 10/29/2014  . Primary osteoarthritis of right knee 10/22/2014  . Inguinal hernia 11/23/2013    Past Surgical History:  Procedure Laterality Date  . HAND SURGERY Right    contracture of hand  . INGUINAL HERNIA REPAIR Right 11/23/2013   Procedure: HERNIA REPAIR INGUINAL ADULT;  Surgeon: Scherry Ran, MD;  Location: [facility];  Service: General;  Laterality: Right;  site-inguinal area  . OPEN  REDUCTION, INTERNAL FIXATION (ORIF) CALCANEAL FRACTURE WITH FUSION Left 11/30/2018   Procedure: OPEN REDUCTION, INTERNAL FIXATION (ORIF) CALCANEAL FRACTURE WITH PERONEAL DEBRIDEMENT & REPAIR OF SUPERIOR PERONEAL RETINACULUM;  Surgeon: Erle Crocker, MD;  Location: [place];  Service: Orthopedics;  Laterality: Left;  Marland Kitchen VASECTOMY    . WRIST SURGERY Right    otif       Family History  Problem Relation Age of Onset  . Stroke Mother   . Cancer Mother        colon  . Alzheimer's disease Father        62 years  . Heart disease Father        atrial fibri  . Heart disease Sister   . Early death Brother   . Cancer Maternal Grandmother     Social History   Tobacco Use  . Smoking status: Never Smoker  . Smokeless tobacco: Never Used  Substance Use Topics  . Alcohol use: Yes    Alcohol/week: 4.0 standard drinks    Types: 4 Cans of beer per week    Comment: occ  . Drug use: No    Home Medications Prior to Admission medications   Medication Sig Start Date End Date Taking? Authorizing Provider  aspirin (BAYER ASPIRIN) 325 MG tablet Take 1 tablet (325 mg total) by mouth daily. 11/30/18 11/30/19  Erle Crocker, MD  benzonatate (TESSALON) 200 MG capsule Take 1 capsule (200 mg  total) by mouth 3 (three) times daily as needed. 11/16/19   Sharion Balloon, FNP  cetirizine (ZYRTEC) 10 MG tablet Take 1 tablet (10 mg total) by mouth daily. Patient taking differently: Take 10 mg by mouth daily as needed for allergies.  10/12/18   Sharion Balloon, FNP  doxycycline (VIBRA-TABS) 100 MG tablet Take 1 tablet (100 mg total) by mouth 2 (two) times daily. 11/16/19   Sharion Balloon, FNP  mupirocin ointment (BACTROBAN) 2 % Apply 1 application topically 2 (two) times daily. 12/04/18   Orpah Greek, MD  pantoprazole (PROTONIX) 40 MG tablet TAKE ONE (1) TABLET EACH DAY 03/06/19   Dettinger, [name], MD  PROAIR HFA [phone] Base) MCG/ACT inhaler USE 2 PUFFS EVERY 6 HOURS AS NEEDED 05/31/19    Dettinger, [name], MD    Allergies    Ibuprofen, Morphine and related, and Bee venom  Review of Systems   Review of Systems  Constitutional: Positive for fever.  HENT: Positive for congestion and sore throat.   Respiratory: Positive for cough.   Cardiovascular: Negative for chest pain.  All other systems reviewed and are negative.   Physical Exam Updated Vital Signs BP 132/90   Pulse (!) 120   Temp (!) 101.5 F (38.6 C) (Oral)   Resp (!) 30   Ht 5\' 6"  (1.676 m)   Wt 86.2 kg   SpO2 92%   BMI 30.67 kg/m   Physical Exam Vitals and nursing note reviewed.  Constitutional:      General: He is not in acute distress.    Appearance: He is well-developed. He is not diaphoretic.  HENT:     Head: Normocephalic and atraumatic.     Mouth/Throat:     Mouth: Mucous membranes are moist.     Pharynx: No oropharyngeal exudate or posterior oropharyngeal erythema.  Cardiovascular:     Rate and Rhythm: Normal rate and regular rhythm.     Heart sounds: No murmur. No friction rub.  Pulmonary:     Effort: Pulmonary effort is normal. No respiratory distress.     Breath sounds: Normal breath sounds. No wheezing or rales.  Abdominal:     General: Bowel sounds are normal. There is no distension.     Palpations: Abdomen is soft.     Tenderness: There is no abdominal tenderness.  Musculoskeletal:        General: Normal range of motion.     Cervical back: Normal range of motion and neck supple.  Skin:    General: Skin is warm and dry.  Neurological:     General: No focal deficit present.     Mental Status: He is alert and oriented to person, place, and time.     Coordination: Coordination normal.     ED Results / Procedures / Treatments   Labs (all labs ordered are listed, but only abnormal results are displayed) Labs Reviewed  CULTURE, BLOOD (ROUTINE X 2)  CULTURE, BLOOD (ROUTINE X 2)  URINE CULTURE  LACTIC ACID, PLASMA  LACTIC ACID, PLASMA  COMPREHENSIVE METABOLIC PANEL    CBC WITH DIFFERENTIAL/PLATELET  APTT  PROTIME-INR  URINALYSIS, ROUTINE W REFLEX MICROSCOPIC    EKG None  Radiology No results found.  Procedures Procedures (including critical care time)  Medications Ordered in ED Medications - No data to display  ED Course  I have reviewed the triage vital signs and the nursing notes.  Pertinent labs & imaging results that were available during my care of the  patient were reviewed by me and considered in my medical decision making (see chart for details).  Patient presenting with chest congestion, difficulty breathing, cough, worsening over the past 2 weeks.  Patient has been seen on 2 prior occasions by his PCP, and is not feeling better.  He became more short of breath this evening and presents for evaluation.  Patient's work-up reveals bilateral multifocal pneumonia consistent with COVID-19.  Nasal swab was confirmatory.  Remainder of laboratory studies are essentially unremarkable.  Patient does have some hypoxia and tachypnea.  I feel as though admission is indicated.  I have spoken with Dr. Denton Brick who agrees to admit.  CRITICAL CARE Performed by: Veryl Speak Total critical care time: 35 minutes Critical care time was exclusive of separately billable procedures and treating other patients. Critical care was necessary to treat or prevent imminent or life-threatening deterioration. Critical care was time spent personally by me on the following activities: development of treatment plan with patient and/or surrogate as well as nursing, discussions with consultants, evaluation of patient's response to treatment, examination of patient, obtaining history from patient or surrogate, ordering and performing treatments and interventions, ordering and review of laboratory studies, ordering and review of radiographic studies, pulse oximetry and re-evaluation of patient's condition.  [name] was evaluated in Emergency Department on 11/19/2019  for the symptoms described in the history of present illness. He was evaluated in the context of the global COVID-19 pandemic, which necessitated consideration that the patient might be at risk for infection with the SARS-CoV-2 virus that causes COVID-19. Institutional protocols and algorithms that pertain to the evaluation of patients at risk for COVID-19 are in a state of rapid change based on information released by regulatory bodies including the CDC and federal and state organizations. These policies and algorithms were followed during the patient's care in the ED.    MDM Rules/Calculators/A&P  Final Clinical Impression(s) / ED Diagnoses Final diagnoses:  None    Rx / DC Orders ED Discharge Orders    None       Veryl Speak, MD 11/19/19 2249

## 2019-11-19 NOTE — ED Notes (Signed)
CRITICAL VALUE ALERT  Critical Value: Lactic 2.1  Date & Time Notied:  11/19/2019 0736  Provider Notified: Dr. Stark Jock   Orders Received/Actions taken: None yet

## 2019-11-19 NOTE — H&P (Addendum)
Patient Demographics:    [name], is a 58 y.o. male  MRN: [id number]   DOB - 1961/02/20  Admit Date - 11/19/2019  Outpatient Primary MD for the patient is Dettinger, [name], MD   Assessment & Plan:    Principal Problem:   Pneumonia due to COVID-19 virus Active Problems:   Acute respiratory disease due to COVID-19 virus   Esophageal reflux    1)Acute hypoxic respiratory failure secondary to COVID-19 infection/pneumonia---  ---patient had fevers up to 101.5, tachycardia with heart rate up to 120, tachypnea with respiratory rate up to 33, and O2 sats was down to 92 to 94% on room air  -Patient meets SIRS criteria due to Covid related respiratory infection, no frank sepsis per se  - the treatment plan and use of medications  for treatment of COVID-19 infection and possible side effects were discussed with patient/family,  explained that there is No proven definitive treatment for COVID-19 infection, any medications used here are based on published clinical articles/anecdotal data which at times and not yet peer-reviewed or randomized control trials. Complete risks and long-term side effects are unknown, however in the best clinical judgment they seem to be of some clinical benefit . --potential side effects of Remdesivir including, but not limited to allergic reaction, nausea, vomiting, elevated LFTs discussed with patient  ,also discussed potential steroid side effect including elevated blood sugars, elevated blood pressure, psychosis/anxiety,  insomnia --Patient verbalizes understanding and agrees to treatment protocols   --Patient is positive for COVID-19 infection, chest x-ray with findings of infiltrates/opacities,  patient is hypoxic and requiring continuous supplemental oxygen---patient meets criteria for  initiation of Remdesivir AND Decadron/Steroid therapy per protocol  -Procalcitonin is less than 0.1, WBC mildly elevated at 11.4 and lactic acid was  elevated at 2.1--, repeat lactic acid after IV fluids 1.8  -patient initially received Rocephin/Azithromycin, okay to discontinue further antibiotics at this time as long as procalcitonin remains low, so trend pro calcitonin -Fibrinogen is elevated at 532, CRP is 0.7, -D-dimer is not elevated currently at 0.46, ferritin is not elevated at 107, LDH is not elevated at 139 --Check and trend fibrinogen, CRP, pro calcitonin, CBC, BMP, d-dimer, LDH, ferritin and LFTs --Supplemental oxygen to keep O2 sats above 93% -Follow serial chest x-rays and ABGs as indicated --- Encourage prone positioning for More than 16 hours/day in increments of 2 to 3 hours at a time if able to tolerate --Attempt to maintain euvolemic state --Zinc and vitamin C as ordered -Albuterol inhaler and mucolytics as ordered   2)GERD--- give Protonix for GI prophylaxis given high-dose steroid use  3)Hyperglycemia--- check Accu-Cheks and use sliding scale insulin while on high-dose steroids  With History of - Reviewed by me  Past Medical History:  Diagnosis Date  . Allergy   . Arthritis   . Calcaneus fracture, left   . Cancer [facility])    Skin cancer Left Arm  . GERD (gastroesophageal reflux disease)   .  Small bowel obstruction [facility])       Past Surgical History:  Procedure Laterality Date  . HAND SURGERY Right    contracture of hand  . INGUINAL HERNIA REPAIR Right 11/23/2013   Procedure: HERNIA REPAIR INGUINAL ADULT;  Surgeon: Scherry Ran, MD;  Location: [facility];  Service: General;  Laterality: Right;  site-inguinal area  . OPEN REDUCTION, INTERNAL FIXATION (ORIF) CALCANEAL FRACTURE WITH FUSION Left 11/30/2018   Procedure: OPEN REDUCTION, INTERNAL FIXATION (ORIF) CALCANEAL FRACTURE WITH PERONEAL DEBRIDEMENT & REPAIR OF SUPERIOR PERONEAL RETINACULUM;  Surgeon: Erle Crocker, MD;  Location: [place];  Service: Orthopedics;  Laterality: Left;  Marland Kitchen VASECTOMY    . WRIST SURGERY Right    otif    Chief Complaint  Patient presents with  . Fever      HPI:    [name]  is a 58 y.o. male with past medical history relevant for GERD presents to the ED with persistent cough, fevers, shortness of breath, myalgias, fatigue and malaise since the last week of November 2020 -Patient had a negative test for COVID-19 on 11/06/2019 and apparently again on 11/18/2019 at urgent care facilities -Return to the ED today with persistent symptoms, wife at home now starting to develop similar symptoms as well -- In ED--point-of-care Covid test is positive today 11/19/2019 -Chest x-ray with bilateral pneumonia consistent with Covid infection -Procalcitonin is less than 0.1, WBC mildly elevated at 11.4 and lactic acid was  elevated at 2.1--, repeat lactic acid after IV fluids 1.8  -patient initially received Rocephin/azithromycin, okay to discontinue further antibiotics at this time as long as procalcitonin remains negative, so trend pro calcitonin -Fibrinogen is elevated at 532, CRP is 0.7, -D-dimer is not elevated currently at 0.46, ferritin is not elevated at 107, LDH is not elevated at 139 -Initially in the ED patient had fevers up to 101.5, tachycardia with heart rate up to 120, tachypnea with respiratory rate up to 33, and O2 sats was down to 92 to 94% on room air    Review of systems:    In addition to the HPI above,   A full Review of  Systems was done, all other systems reviewed are negative except as noted above in HPI , .    Social History:  Reviewed by me    Social History   Tobacco Use  . Smoking status: Never Smoker  . Smokeless tobacco: Never Used  Substance Use Topics  . Alcohol use: Yes    Alcohol/week: 4.0 standard drinks    Types: 4 Cans of beer per week    Comment: occ     Family History :  Reviewed by me    Family History    Problem Relation Age of Onset  . Stroke Mother   . Cancer Mother        colon  . Alzheimer's disease Father        56 years  . Heart disease Father        atrial fibri  . Heart disease Sister   . Early death Brother   . Cancer Maternal Grandmother     Home Medications:   Prior to Admission medications   Medication Sig Start Date End Date Taking? Authorizing Provider  aspirin (BAYER ASPIRIN) 325 MG tablet Take 1 tablet (325 mg total) by mouth daily. 11/30/18 11/30/19  Erle Crocker, MD  benzonatate (TESSALON) 200 MG capsule Take 1 capsule (200 mg total) by mouth 3 (three) times daily as needed.  11/16/19   Sharion Balloon, FNP  cetirizine (ZYRTEC) 10 MG tablet Take 1 tablet (10 mg total) by mouth daily. Patient taking differently: Take 10 mg by mouth daily as needed for allergies.  10/12/18   Sharion Balloon, FNP  doxycycline (VIBRA-TABS) 100 MG tablet Take 1 tablet (100 mg total) by mouth 2 (two) times daily. 11/16/19   Sharion Balloon, FNP  mupirocin ointment (BACTROBAN) 2 % Apply 1 application topically 2 (two) times daily. 12/04/18   Orpah Greek, MD  pantoprazole (PROTONIX) 40 MG tablet TAKE ONE (1) TABLET EACH DAY 03/06/19   Dettinger, [name], MD  PROAIR HFA [phone] Base) MCG/ACT inhaler USE 2 PUFFS EVERY 6 HOURS AS NEEDED 05/31/19   Dettinger, [name], MD     Allergies:     Allergies  Allergen Reactions  . Ibuprofen Swelling  . Morphine And Related Swelling  . Bee Venom Rash     Physical Exam:   Vitals  Blood pressure (!) 126/93, pulse (!) 104, temperature 99.8 F (37.7 C), temperature source Oral, resp. rate (!) 22, height 5\' 6"  (1.676 m), weight 86.2 kg, SpO2 95 %.  Physical Examination: General appearance - alert,  ill -appearing, and in no distress Mental status - alert, oriented to person, place, and time,  Eyes - sclera anicteric Nose- Atkinson 2 L/min Neck - supple, no JVD elevation , Chest -diminished breath sounds bilaterally with  scattered rhonchi and wheezes  heart - S1 and S2 normal, regular , tachycardic Abdomen - soft, nontender, nondistended, no masses or organomegaly Neurological - screening mental status exam normal, neck supple without rigidity, cranial nerves II through XII intact, DTR's normal and symmetric Extremities - no pedal edema noted, intact peripheral pulses  Skin - warm, dry     Data Review:    CBC Recent Labs  Lab 11/19/19 1638  WBC 11.4*  HGB 16.6  HCT 48.1  PLT 209  MCV 96.4  MCH 33.3  MCHC 34.5  RDW 12.3  LYMPHSABS 0.8  MONOABS 0.7  EOSABS 0.0  BASOSABS 0.0   ------------------------------------------------------------------------------------------------------------------  Chemistries  Recent Labs  Lab 11/19/19 1638  NA 135  K 3.7  CL 101  CO2 25  GLUCOSE 124*  BUN 17  CREATININE 1.00  CALCIUM 8.5*  AST 19  ALT 23  ALKPHOS 72  BILITOT 0.5   ------------------------------------------------------------------------------------------------------------------ estimated creatinine clearance is 84.9 mL/min (by C-G formula based on SCr of 1 mg/dL). ------------------------------------------------------------------------------------------------------------------ No results for input(s): TSH, T4TOTAL, T3FREE, THYROIDAB in the last 72 hours.  Invalid input(s): FREET3   Coagulation profile Recent Labs  Lab 11/19/19 1638  INR 1.0   ------------------------------------------------------------------------------------------------------------------- Recent Labs    11/19/19 1638  DDIMER 0.46   -------------------------------------------------------------------------------------------------------------------  Cardiac Enzymes No results for input(s): CKMB, TROPONINI, MYOGLOBIN in the last 168 hours.  Invalid input(s): CK ------------------------------------------------------------------------------------------------------------------ No results found for:  BNP   ---------------------------------------------------------------------------------------------------------------  Urinalysis    Component Value Date/Time   COLORURINE YELLOW 11/19/2019 1622   APPEARANCEUR CLEAR 11/19/2019 1622   LABSPEC 1.027 11/19/2019 1622   PHURINE 5.0 11/19/2019 1622   GLUCOSEU NEGATIVE 11/19/2019 1622   HGBUR NEGATIVE 11/19/2019 1622   BILIRUBINUR NEGATIVE 11/19/2019 1622   KETONESUR NEGATIVE 11/19/2019 1622   PROTEINUR 30 (A) 11/19/2019 1622   UROBILINOGEN 0.2 11/06/2014 2236   NITRITE NEGATIVE 11/19/2019 1622   LEUKOCYTESUR NEGATIVE 11/19/2019 1622    ----------------------------------------------------------------------------------------------------------------   Imaging Results:    DG Chest Port 1 View  Result Date: 11/19/2019 CLINICAL  DATA:  Fever, cough, presumed COVID-19 positive EXAM: PORTABLE CHEST 1 VIEW COMPARISON:  CTA chest 12/04/2018, radiograph 11/18/2019 FINDINGS: Rapidly worsening multifocal interstitial and airspace opacities with a peripheral predominance. No pneumothorax or effusion. The cardiomediastinal contours are unremarkable. No acute osseous or soft tissue abnormality. IMPRESSION: Rapidly worsening multifocal interstitial and airspace opacity compatible with a viral pneumonia, less likely edema. Electronically Signed   By: Lovena Le M.D.   On: 11/19/2019 17:07    Radiological Exams on Admission: DG Chest Port 1 View  Result Date: 11/19/2019 CLINICAL DATA:  Fever, cough, presumed COVID-19 positive EXAM: PORTABLE CHEST 1 VIEW COMPARISON:  CTA chest 12/04/2018, radiograph 11/18/2019 FINDINGS: Rapidly worsening multifocal interstitial and airspace opacities with a peripheral predominance. No pneumothorax or effusion. The cardiomediastinal contours are unremarkable. No acute osseous or soft tissue abnormality. IMPRESSION: Rapidly worsening multifocal interstitial and airspace opacity compatible with a viral pneumonia, less likely  edema. Electronically Signed   By: Lovena Le M.D.   On: 11/19/2019 17:07    DVT Prophylaxis -SCD /Lovenox AM Labs Ordered, also please review Full Orders  Family Communication: Admission, patients condition and plan of care including tests being ordered have been discussed with the patient  who indicate understanding and agree with the plan   Code Status - Full Code  Likely DC to  TBD  Condition   stable  Roxan Hockey M.D on 11/19/2019 at 9:22 PM Go to [email or web address] -  for contact info  [facility] - Office  [phone]

## 2019-11-19 NOTE — ED Notes (Addendum)
Given verbal order to still give rocephin and zithromax 1x tonight.

## 2019-11-19 NOTE — Sepsis Progress Note (Addendum)
Notified provider of need to order antibiotics and fluid bolus. Secure chat with Dr Stark Jock.  Suspecting COVID- 19. On doxycycline prior to admission. He will make note.

## 2019-11-19 NOTE — ED Triage Notes (Signed)
Pt went to UC yesterday and had testing done. Was presumed to have covid. Chest pain, shortness of breath, fever, and fatigue.

## 2019-11-20 DIAGNOSIS — J9601 Acute respiratory failure with hypoxia: Secondary | ICD-10-CM

## 2019-11-20 DIAGNOSIS — U071 COVID-19: Secondary | ICD-10-CM

## 2019-11-20 DIAGNOSIS — R0902 Hypoxemia: Secondary | ICD-10-CM

## 2019-11-20 DIAGNOSIS — R6520 Severe sepsis without septic shock: Secondary | ICD-10-CM

## 2019-11-20 DIAGNOSIS — A419 Sepsis, unspecified organism: Secondary | ICD-10-CM

## 2019-11-20 DIAGNOSIS — J1289 Other viral pneumonia: Secondary | ICD-10-CM

## 2019-11-20 DIAGNOSIS — K219 Gastro-esophageal reflux disease without esophagitis: Secondary | ICD-10-CM

## 2019-11-20 LAB — FERRITIN: Ferritin: 116 ng/mL (ref 24–336)

## 2019-11-20 LAB — MAGNESIUM: Magnesium: 2 mg/dL (ref 1.7–2.4)

## 2019-11-20 LAB — COMPREHENSIVE METABOLIC PANEL
ALT: 22 U/L (ref 0–44)
AST: 22 U/L (ref 15–41)
Albumin: 3.7 g/dL (ref 3.5–5.0)
Alkaline Phosphatase: 70 U/L (ref 38–126)
Anion gap: 12 (ref 5–15)
BUN: 20 mg/dL (ref 6–20)
CO2: 24 mmol/L (ref 22–32)
Calcium: 8.8 mg/dL — ABNORMAL LOW (ref 8.9–10.3)
Chloride: 100 mmol/L (ref 98–111)
Creatinine, Ser: 1.04 mg/dL (ref 0.61–1.24)
GFR calc Af Amer: >60 mL/min (ref >60)
GFR calc non Af Amer: >60 mL/min (ref >60)
Glucose, Bld: 145 mg/dL — ABNORMAL HIGH (ref 70–99)
Potassium: 4.3 mmol/L (ref 3.5–5.1)
Sodium: 136 mmol/L (ref 135–145)
Total Bilirubin: 0.7 mg/dL (ref 0.3–1.2)
Total Protein: 7.2 g/dL (ref 6.5–8.1)

## 2019-11-20 LAB — HEMOGLOBIN A1C
Hgb A1c MFr Bld: 6 % — ABNORMAL HIGH (ref 4.8–5.6)
Mean Plasma Glucose: 125.5 mg/dL

## 2019-11-20 LAB — CBG MONITORING, ED
Glucose-Capillary: 108 mg/dL — ABNORMAL HIGH (ref 70–99)
Glucose-Capillary: 200 mg/dL — ABNORMAL HIGH (ref 70–99)
Glucose-Capillary: 105 mg/dL — ABNORMAL HIGH (ref 70–99)

## 2019-11-20 LAB — D-DIMER, QUANTITATIVE: D-Dimer, Quant: 0.54 ug/mL-FEU — ABNORMAL HIGH (ref 0.00–0.50)

## 2019-11-20 LAB — URINE CULTURE: Culture: NO GROWTH

## 2019-11-20 LAB — ABO/RH: ABO/RH(D): O POS

## 2019-11-20 LAB — PROCALCITONIN: Procalcitonin: 0.12 ng/mL

## 2019-11-20 LAB — C-REACTIVE PROTEIN: CRP: 1 mg/dL — ABNORMAL HIGH (ref <1.0)

## 2019-11-20 LAB — PHOSPHORUS: Phosphorus: 3.9 mg/dL (ref 2.5–4.6)

## 2019-11-20 NOTE — ED Notes (Signed)
ED TO INPATIENT HANDOFF REPORT  ED Nurse Name and Phone #: [phone]  S Name/Age/Gender [name] 58 y.o. male Room/Bed: APA10/APA10  Code Status   Code Status: Full Code  Home/SNF/Other Home Patient oriented to: self, place, time and situation Is this baseline? Yes   Triage Complete: Triage complete  Chief Complaint PNA (pneumonia) [J18.9] COVID-19 virus infection [U07.1] Pneumonia due to COVID-19 virus [U07.1, J12.89]  Triage Note Pt went to UC yesterday and had testing done. Was presumed to have covid. Chest pain, shortness of breath, fever, and fatigue.     Allergies Allergies  Allergen Reactions  . Morphine And Related Swelling  . Bee Venom Rash    Level of Care/Admitting Diagnosis ED Disposition    ED Disposition Condition [facility]: [place] [[id number]]  Level of Care: Med-Surg [16]  Covid Evaluation: Confirmed COVID Positive  Diagnosis: Pneumonia due to COVID-19 virus [[id number]]  Admitting Physician: Barton Dubois [[id number]]  Attending Physician: Barton Dubois [[id number]]  Estimated length of stay: past midnight tomorrow  Certification:: I certify this patient will need inpatient services for at least 2 midnights       B Medical/Surgery History Past Medical History:  Diagnosis Date  . Allergy   . Arthritis   . Calcaneus fracture, left   . Cancer [facility])    Skin cancer Left Arm  . GERD (gastroesophageal reflux disease)   . Small bowel obstruction [facility])    Past Surgical History:  Procedure Laterality Date  . HAND SURGERY Right    contracture of hand  . INGUINAL HERNIA REPAIR Right 11/23/2013   Procedure: HERNIA REPAIR INGUINAL ADULT;  Surgeon: Scherry Ran, MD;  Location: [facility];  Service: General;  Laterality: Right;  site-inguinal area  . OPEN REDUCTION, INTERNAL FIXATION (ORIF) CALCANEAL FRACTURE WITH FUSION Left 11/30/2018   Procedure: OPEN REDUCTION, INTERNAL FIXATION (ORIF) CALCANEAL FRACTURE  WITH PERONEAL DEBRIDEMENT & REPAIR OF SUPERIOR PERONEAL RETINACULUM;  Surgeon: Erle Crocker, MD;  Location: [place];  Service: Orthopedics;  Laterality: Left;  Marland Kitchen VASECTOMY    . WRIST SURGERY Right    otif     A IV Location/Drains/Wounds Patient Lines/Drains/Airways Status   Active Line/Drains/Airways    Name:   Placement date:   Placement time:   Site:   Days:   Peripheral IV 11/19/19 Right Hand   11/19/19    1639    Hand   1   Incision 11/23/13 Other (Comment) Right   11/23/13    0907     2188   Incision (Closed) 11/30/18 Foot Left   11/30/18    0822     355          Intake/Output Last 24 hours  Intake/Output Summary (Last 24 hours) at 11/20/2019 2110 Last data filed at 11/20/2019 1500 Gross per 24 hour  Intake --  Output 750 ml  Net -750 ml    Labs/Imaging Results for orders placed or performed during the hospital encounter of 11/19/19 (from the past 48 hour(s))  Urinalysis, Routine w reflex microscopic     Status: Abnormal   Collection Time: 11/19/19  4:22 PM  Result Value Ref Range   Color, Urine YELLOW YELLOW   APPearance CLEAR CLEAR   Specific Gravity, Urine 1.027 1.005 - 1.030   pH 5.0 5.0 - 8.0   Glucose, UA NEGATIVE NEGATIVE mg/dL   Hgb urine dipstick NEGATIVE NEGATIVE   Bilirubin Urine NEGATIVE NEGATIVE   Ketones, ur  NEGATIVE NEGATIVE mg/dL   Protein, ur 30 (A) NEGATIVE mg/dL   Nitrite NEGATIVE NEGATIVE   Leukocytes,Ua NEGATIVE NEGATIVE   RBC / HPF 0-5 0 - 5 RBC/hpf   WBC, UA 0-5 0 - 5 WBC/hpf   Bacteria, UA NONE SEEN NONE SEEN   Squamous Epithelial / LPF 0-5 0 - 5   Mucus PRESENT    Ca Oxalate Crys, UA PRESENT     Comment: Performed at [facility], [address] [place], [place] [address]  Urine culture     Status: None   Collection Time: 11/19/19  4:22 PM   Specimen: In/Out Cath Urine  Result Value Ref Range   Specimen Description      IN/OUT CATH URINE Performed at [facility], [address] [place], [place] [address]    Special  Requests      NONE Performed at [facility], [address], [place], [place] [address]    Culture      NO GROWTH Performed at [facility], [place] [address], [place], [place] [address]    Report Status 11/20/2019 FINAL   Lactic acid, plasma     Status: Abnormal   Collection Time: 11/19/19  4:38 PM  Result Value Ref Range   Lactic Acid, Venous 2.1 (HH) 0.5 - 1.9 mmol/L    Comment: CRITICAL RESULT CALLED TO, READ BACK BY AND VERIFIED WITH: WILEY,E ON 11/19/19 AT 1740 BY LOY,C Performed at [facility], [address], [place], [place] [address]   Comprehensive metabolic panel     Status: Abnormal   Collection Time: 11/19/19  4:38 PM  Result Value Ref Range   Sodium 135 135 - 145 mmol/L   Potassium 3.7 3.5 - 5.1 mmol/L   Chloride 101 98 - 111 mmol/L   CO2 25 22 - 32 mmol/L   Glucose, Bld 124 (H) 70 - 99 mg/dL   BUN 17 6 - 20 mg/dL   Creatinine, Ser 1.00 0.61 - 1.24 mg/dL   Calcium 8.5 (L) 8.9 - 10.3 mg/dL   Total Protein 7.1 6.5 - 8.1 g/dL   Albumin 3.7 3.5 - 5.0 g/dL   AST 19 15 - 41 U/L   ALT 23 0 - 44 U/L   Alkaline Phosphatase 72 38 - 126 U/L   Total Bilirubin 0.5 0.3 - 1.2 mg/dL   GFR calc non Af Amer >60 >60 mL/min   GFR calc Af Amer >60 >60 mL/min   Anion gap 9 5 - 15    Comment: Performed at [facility], [address], [place], [place] [address]  CBC WITH DIFFERENTIAL     Status: Abnormal   Collection Time: 11/19/19  4:38 PM  Result Value Ref Range   WBC 11.4 (H) 4.0 - 10.5 K/uL   RBC 4.99 4.22 - 5.81 MIL/uL   Hemoglobin 16.6 13.0 - 17.0 g/dL   HCT 48.1 39.0 - 52.0 %   MCV 96.4 80.0 - 100.0 fL   MCH 33.3 26.0 - 34.0 pg   MCHC 34.5 30.0 - 36.0 g/dL   RDW 12.3 11.5 - 15.5 %   Platelets 209 150 - 400 K/uL   nRBC 0.0 0.0 - 0.2 %   Neutrophils Relative % 87 %   Neutro Abs 9.8 (H) 1.7 - 7.7 K/uL   Lymphocytes Relative 7 %   Lymphs Abs 0.8 0.7 - 4.0 K/uL   Monocytes Relative 6 %   Monocytes Absolute 0.7 0.1 - 1.0 K/uL   Eosinophils Relative 0 %  Eosinophils Absolute 0.0 0.0 - 0.5 K/uL   Basophils Relative 0 %   Basophils Absolute 0.0 0.0 - 0.1 K/uL   Immature Granulocytes 0 %   Abs Immature Granulocytes 0.03 0.00 - 0.07 K/uL    Comment: Performed at [facility], [address], [place], [place] [address]  APTT     Status: None   Collection Time: 11/19/19  4:38 PM  Result Value Ref Range   aPTT 26 24 - 36 seconds    Comment: Performed at [facility], [address] [place], [place] [address]  Protime-INR     Status: None   Collection Time: 11/19/19  4:38 PM  Result Value Ref Range   Prothrombin Time 13.1 11.4 - 15.2 seconds   INR 1.0 0.8 - 1.2    Comment: (NOTE) INR goal varies based on device and disease states. Performed at [facility], [address] [place], [place] [address]   Fibrinogen     Status: Abnormal   Collection Time: 11/19/19  4:38 PM  Result Value Ref Range   Fibrinogen 532 (H) 210 - 475 mg/dL    Comment: Performed at [facility], [address], [place], [place] [address]  Procalcitonin - Baseline     Status: None   Collection Time: 11/19/19  4:38 PM  Result Value Ref Range   Procalcitonin <0.10 ng/mL    Comment:        Interpretation: PCT (Procalcitonin) <= 0.5 ng/mL: Systemic infection (sepsis) is not likely. Local bacterial infection is possible. (NOTE)       Sepsis PCT Algorithm           Lower Respiratory Tract                                      Infection PCT Algorithm    ----------------------------     ----------------------------         PCT < 0.25 ng/mL                PCT < 0.10 ng/mL         Strongly encourage             Strongly discourage   discontinuation of antibiotics    initiation of antibiotics    ----------------------------     -----------------------------       PCT 0.25 - 0.50 ng/mL            PCT 0.10 - 0.25 ng/mL               OR       >80% decrease in PCT            Discourage initiation of                                            antibiotics      Encourage  discontinuation           of antibiotics    ----------------------------     -----------------------------         PCT >= 0.50 ng/mL              PCT 0.26 - 0.50 ng/mL               AND        <80%  decrease in PCT             Encourage initiation of                                             antibiotics       Encourage continuation           of antibiotics    ----------------------------     -----------------------------        PCT >= 0.50 ng/mL                  PCT > 0.50 ng/mL               AND         increase in PCT                  Strongly encourage                                      initiation of antibiotics    Strongly encourage escalation           of antibiotics                                     -----------------------------                                           PCT <= 0.25 ng/mL                                                 OR                                        > 80% decrease in PCT                                     Discontinue / Do not initiate                                             antibiotics Performed at [facility], [address], [place], [place] [address]   C-reactive protein     Status: None   Collection Time: 11/19/19  4:38 PM  Result Value Ref Range   CRP 0.7 <1.0 mg/dL    Comment: Performed at [facility], [address], [place], [place] [address]  D-dimer, quantitative (not at [facility])     Status: None   Collection Time: 11/19/19  4:38 PM  Result Value Ref Range   D-Dimer, Quant 0.46 0.00 - 0.50 ug/mL-FEU    Comment: (NOTE) At the manufacturer cut-off of 0.50 ug/mL FEU, this assay has been documented to exclude PE with  a sensitivity and negative predictive value of 97 to 99%.  At this time, this assay has not been approved by the FDA to exclude DVT/VTE. Results should be correlated with clinical presentation. Performed at [facility], [address], [place], [place] [address]   Ferritin     Status: None   Collection Time: 11/19/19   4:38 PM  Result Value Ref Range   Ferritin 107 24 - 336 ng/mL    Comment: Performed at [facility], [address] [place], [place] [address]  HIV Antibody (routine testing w rflx)     Status: None   Collection Time: 11/19/19  4:38 PM  Result Value Ref Range   HIV Screen 4th Generation wRfx NON REACTIVE NON REACTIVE    Comment: Performed at [facility] [address] [place], [place] [address]  Lactate dehydrogenase     Status: None   Collection Time: 11/19/19  4:38 PM  Result Value Ref Range   LDH 139 98 - 192 U/L    Comment: Performed at [facility], [address], [place], [place] [address]  Hemoglobin A1c     Status: Abnormal   Collection Time: 11/19/19  4:38 PM  Result Value Ref Range   Hgb A1c MFr Bld 6.0 (H) 4.8 - 5.6 %    Comment: (NOTE) Pre diabetes:          5.7%-6.4% Diabetes:              >6.4% Glycemic control for   <7.0% adults with diabetes    Mean Plasma Glucose 125.5 mg/dL    Comment: Performed at [facility] [address] [place], [place] [address]  Blood Culture (routine x 2)     Status: None (Preliminary result)   Collection Time: 11/19/19  4:39 PM   Specimen: BLOOD RIGHT HAND  Result Value Ref Range   Specimen Description BLOOD RIGHT HAND    Special Requests      BOTTLES DRAWN AEROBIC AND ANAEROBIC Blood Culture adequate volume   Culture      NO GROWTH < 24 HOURS Performed at [facility], [address] [place], [place] [address]    Report Status PENDING   Blood Culture (routine x 2)     Status: None (Preliminary result)   Collection Time: 11/19/19  4:40 PM   Specimen: BLOOD  Result Value Ref Range   Specimen Description BLOOD LEFT ANTECUBITAL    Special Requests      BOTTLES DRAWN AEROBIC AND ANAEROBIC Blood Culture adequate volume   Culture      NO GROWTH < 24 HOURS Performed at [facility], [address], [place], [place] [address]    Report Status PENDING   Lactic acid, plasma     Status: None   Collection Time:  11/19/19  6:25 PM  Result Value Ref Range   Lactic Acid, Venous 1.8 0.5 - 1.9 mmol/L    Comment: Performed at [facility], [address], [place], [place] [address]  POC SARS Coronavirus 2 Ag-ED - Nasal Swab (BD Veritor Kit)     Status: Abnormal   Collection Time: 11/19/19  7:03 PM  Result Value Ref Range   SARS Coronavirus 2 Ag POSITIVE (A) NEGATIVE    Comment: (NOTE) SARS-CoV-2 antigen PRESENT. Positive results indicate the presence of viral antigens, but clinical correlation with patient history and other diagnostic information is necessary to determine patient infection status.  Positive results do not rule out bacterial infection or co-infection  with other viruses. False positive results are rare but can occur, and confirmatory RT-PCR testing may be appropriate in some circumstances. The expected result is Negative. Fact Sheet for Patients: PodPark.tn Fact Sheet for Providers: GiftContent.is  This test is not yet approved or cleared by the [place] FDA and  has been authorized for detection and/or diagnosis of SARS-CoV-2 by FDA under an Emergency Use Authorization (EUA).  This EUA will remain in effect (meaning this test can be used) for the duration of  the COVID-19 declaration under Section 564(b)(1) of the Act, 21 U.S.C. section 360bbb-3(b)(1), unless the a uthorization is terminated or revoked sooner.   Procalcitonin     Status: None   Collection Time: 11/20/19  5:44 AM  Result Value Ref Range   Procalcitonin 0.12 ng/mL    Comment:        Interpretation: PCT (Procalcitonin) <= 0.5 ng/mL: Systemic infection (sepsis) is not likely. Local bacterial infection is possible. (NOTE)       Sepsis PCT Algorithm           Lower Respiratory Tract                                      Infection PCT Algorithm    ----------------------------     ----------------------------         PCT < 0.25 ng/mL                PCT <  0.10 ng/mL         Strongly encourage             Strongly discourage   discontinuation of antibiotics    initiation of antibiotics    ----------------------------     -----------------------------       PCT 0.25 - 0.50 ng/mL            PCT 0.10 - 0.25 ng/mL               OR       >80% decrease in PCT            Discourage initiation of                                            antibiotics      Encourage discontinuation           of antibiotics    ----------------------------     -----------------------------         PCT >= 0.50 ng/mL              PCT 0.26 - 0.50 ng/mL               AND        <80% decrease in PCT             Encourage initiation of                                             antibiotics       Encourage continuation           of antibiotics    ----------------------------     -----------------------------  PCT >= 0.50 ng/mL                  PCT > 0.50 ng/mL               AND         increase in PCT                  Strongly encourage                                      initiation of antibiotics    Strongly encourage escalation           of antibiotics                                     -----------------------------                                           PCT <= 0.25 ng/mL                                                 OR                                        > 80% decrease in PCT                                     Discontinue / Do not initiate                                             antibiotics Performed at [facility], [address], [place], [place] [address]   ABO/Rh     Status: None   Collection Time: 11/20/19  5:44 AM  Result Value Ref Range   ABO/RH(D)      O POS Performed at [facility], [address], [place], [place] [address]   Ferritin     Status: None   Collection Time: 11/20/19  5:44 AM  Result Value Ref Range   Ferritin 116 24 - 336 ng/mL    Comment: Performed at [facility], [address], [place], [place] [address]   C-reactive protein     Status: Abnormal   Collection Time: 11/20/19  5:44 AM  Result Value Ref Range   CRP 1.0 (H) <1.0 mg/dL    Comment: Performed at [facility], [address] [place], [place] [address]  D-dimer, quantitative (not at [facility])     Status: Abnormal   Collection Time: 11/20/19  5:44 AM  Result Value Ref Range   D-Dimer, Quant 0.54 (H) 0.00 - 0.50 ug/mL-FEU    Comment: (NOTE) At the manufacturer cut-off of 0.50 ug/mL FEU, this assay has been documented to exclude PE with a sensitivity and negative predictive value of 97 to  99%.  At this time, this assay has not been approved by the FDA to exclude DVT/VTE. Results should be correlated with clinical presentation. Performed at [facility], [address] [place], [place] [address]   Magnesium     Status: None   Collection Time: 11/20/19  5:44 AM  Result Value Ref Range   Magnesium 2.0 1.7 - 2.4 mg/dL    Comment: Performed at [facility], [address] [place], [place] [address]  Phosphorus     Status: None   Collection Time: 11/20/19  5:44 AM  Result Value Ref Range   Phosphorus 3.9 2.5 - 4.6 mg/dL    Comment: Performed at [facility], [address], [place], [place] [address]  Comprehensive metabolic panel     Status: Abnormal   Collection Time: 11/20/19  5:44 AM  Result Value Ref Range   Sodium 136 135 - 145 mmol/L   Potassium 4.3 3.5 - 5.1 mmol/L   Chloride 100 98 - 111 mmol/L   CO2 24 22 - 32 mmol/L   Glucose, Bld 145 (H) 70 - 99 mg/dL   BUN 20 6 - 20 mg/dL   Creatinine, Ser 1.04 0.61 - 1.24 mg/dL   Calcium 8.8 (L) 8.9 - 10.3 mg/dL   Total Protein 7.2 6.5 - 8.1 g/dL   Albumin 3.7 3.5 - 5.0 g/dL   AST 22 15 - 41 U/L   ALT 22 0 - 44 U/L   Alkaline Phosphatase 70 38 - 126 U/L   Total Bilirubin 0.7 0.3 - 1.2 mg/dL   GFR calc non Af Amer >60 >60 mL/min   GFR calc Af Amer >60 >60 mL/min   Anion gap 12 5 - 15    Comment: Performed at [facility], [address], [place], [place] [address]  CBG monitoring,  ED     Status: Abnormal   Collection Time: 11/20/19  7:58 AM  Result Value Ref Range   Glucose-Capillary 108 (H) 70 - 99 mg/dL  CBG monitoring, ED     Status: Abnormal   Collection Time: 11/20/19 11:20 AM  Result Value Ref Range   Glucose-Capillary 200 (H) 70 - 99 mg/dL  CBG monitoring, ED     Status: Abnormal   Collection Time: 11/20/19  4:06 PM  Result Value Ref Range   Glucose-Capillary 105 (H) 70 - 99 mg/dL   Comment 1 Notify RN    Comment 2 Document in Chart    DG Chest Port 1 View  Result Date: 11/19/2019 CLINICAL DATA:  Fever, cough, presumed COVID-19 positive EXAM: PORTABLE CHEST 1 VIEW COMPARISON:  CTA chest 12/04/2018, radiograph 11/18/2019 FINDINGS: Rapidly worsening multifocal interstitial and airspace opacities with a peripheral predominance. No pneumothorax or effusion. The cardiomediastinal contours are unremarkable. No acute osseous or soft tissue abnormality. IMPRESSION: Rapidly worsening multifocal interstitial and airspace opacity compatible with a viral pneumonia, less likely edema. Electronically Signed   By: Lovena Le M.D.   On: 11/19/2019 17:07    Pending Labs Unresulted Labs (From admission, onward)    Start     Ordered   11/21/19 0500  Comprehensive metabolic panel  Daily,   R     11/20/19 0849   11/20/19 0500  Procalcitonin  Daily,   R     11/19/19 1834   11/20/19 0500  Ferritin  Daily,   R     11/19/19 1839   11/20/19 0500  C-reactive protein  Daily,   R     11/19/19 1839  11/20/19 0500  D-dimer, quantitative (not at [facility])  Daily,   R     11/19/19 1839   11/20/19 0500  Magnesium  Daily,   R     11/19/19 1839   11/20/19 0500  Phosphorus  Daily,   R     11/19/19 1839          Vitals/Pain Today's Vitals   11/20/19 1758 11/20/19 1900 11/20/19 2000 11/20/19 2100  BP:  130/84 119/84 127/84  Pulse: 65 80 74 76  Resp: 18 14 (!) 23 18  Temp:      TempSrc:      SpO2: 96% 96% 92% 94%  Weight:      Height:      PainSc:        Isolation  Precautions Airborne and Contact precautions  Medications Medications  ascorbic acid (VITAMIN C) tablet 500 mg (500 mg Oral Given 11/20/19 0927)  zinc sulfate capsule 220 mg (220 mg Oral Given 11/20/19 1022)  multivitamin with minerals tablet 1 tablet (1 tablet Oral Given 63/84/66 5993)  folic acid (FOLVITE) tablet 1 mg (1 mg Oral Given 11/20/19 0927)  thiamine tablet 100 mg (100 mg Oral Given 11/20/19 1022)  guaiFENesin-dextromethorphan (ROBITUSSIN DM) 100-10 MG/5ML syrup 10 mL (has no administration in time range)  chlorpheniramine-HYDROcodone (TUSSIONEX) 10-8 MG/5ML suspension 5 mL (has no administration in time range)  albuterol (VENTOLIN HFA) 108 (90 Base) MCG/ACT inhaler 2 puff (2 puffs Inhalation Given 11/20/19 1904)  guaiFENesin (MUCINEX) 12 hr tablet 600 mg (600 mg Oral Given 11/20/19 0927)  methylPREDNISolone sodium succinate (SOLU-MEDROL) 40 mg/mL injection 40 mg (40 mg Intravenous Given 11/20/19 1840)  remdesivir 200 mg in sodium chloride 0.9% 250 mL IVPB (0 mg Intravenous Stopped 11/20/19 0034)    Followed by  remdesivir 100 mg in sodium chloride 0.9 % 100 mL IVPB (0 mg Intravenous Stopped 11/20/19 1054)  loratadine (CLARITIN) tablet 10 mg (10 mg Oral Given 11/20/19 0927)  aspirin tablet 325 mg (325 mg Oral Given 11/20/19 0927)  mupirocin ointment (BACTROBAN) 2 % 1 application (1 application Topical Not Given 11/20/19 1025)  pantoprazole (PROTONIX) EC tablet 40 mg (40 mg Oral Given 11/20/19 0927)  benzonatate (TESSALON) capsule 200 mg (has no administration in time range)  sodium chloride flush (NS) 0.9 % injection 3 mL (3 mLs Intravenous Not Given 11/20/19 1104)  sodium chloride flush (NS) 0.9 % injection 3 mL (has no administration in time range)  0.9 %  sodium chloride infusion (has no administration in time range)  acetaminophen (TYLENOL) tablet 650 mg (has no administration in time range)    Or  acetaminophen (TYLENOL) suppository 650 mg (has no administration in time  range)  traZODone (DESYREL) tablet 50 mg (has no administration in time range)  polyethylene glycol (MIRALAX / GLYCOLAX) packet 17 g (has no administration in time range)  ondansetron (ZOFRAN) tablet 4 mg ( Oral See Alternative 11/19/19 2216)    Or  ondansetron (ZOFRAN) injection 4 mg (4 mg Intravenous Given by Other 11/19/19 2216)  enoxaparin (LOVENOX) injection 40 mg (40 mg Subcutaneous Given 11/20/19 0929)  insulin aspart (novoLOG) injection 0-6 Units (0 Units Subcutaneous Not Given 11/20/19 1609)  insulin aspart (novoLOG) injection 0-5 Units (0 Units Subcutaneous Not Given 11/20/19 0307)  albuterol (VENTOLIN HFA) 108 (90 Base) MCG/ACT inhaler 2 puff (has no administration in time range)  acetaminophen (TYLENOL) tablet 1,000 mg (1,000 mg Oral Given 11/19/19 1743)  methylPREDNISolone sodium succinate (SOLU-MEDROL) 125 mg/2 mL injection 125 mg (125 mg Intravenous  Given 11/19/19 2043)    Mobility walks Low fall risk   Focused Assessments    R Recommendations: See Admitting Provider Note  Report given to:   Additional Notes:

## 2019-11-20 NOTE — Progress Notes (Signed)
PROGRESS NOTE    [name]  [id number] DOB: 09-24-1961 DOA: 11/19/2019 PCP: Dettinger, [name], MD     Brief Narrative:  As per H&P written by Dr. Denton Brick on 11/19/2019 [name]  is a 58 y.o. male with past medical history relevant for GERD presents to the ED with persistent cough, fevers, shortness of breath, myalgias, fatigue and malaise since the last week of November 2020 -Patient had a negative test for COVID-19 on 11/06/2019 and apparently again on 11/18/2019 at urgent care facilities -Return to the ED today with persistent symptoms, wife at home now starting to develop similar symptoms as well -- In ED--point-of-care Covid test is positive today 11/19/2019 -Chest x-ray with bilateral pneumonia consistent with Covid infection -Procalcitonin is less than 0.1, WBC mildly elevated at 11.4 and lactic acid was  elevated at 2.1--, repeat lactic acid after IV fluids 1.8  -patient initially received Rocephin/azithromycin, okay to discontinue further antibiotics at this time as long as procalcitonin remains negative, so trend pro calcitonin -Fibrinogen is elevated at 532, CRP is 0.7, -D-dimer is not elevated currently at 0.46, ferritin is not elevated at 107, LDH is not elevated at 139 -Initially in the ED patient had fevers up to 101.5, tachycardia with heart rate up to 120, tachypnea with respiratory rate up to 33, and O2 sats was down to 90 on room air at rest.  Assessment & Plan: 1-sepsis in the setting of COVID-19 pneumonia -Patient has met criteria for sepsis on presentation with elevated temperature, tachycardia, tachypnea and findings of chest x-ray on positive COVID-19 test. -Continue IV remdesivir -Continue IV steroids -As needed bronchodilators -Wean oxygen supplementation as tolerated -continue to follow inflammatory markers trend -Continue Zinc and vitamin C -Follow clinical response. -CRP 1.0; 0.54; normalities.  2-GERD -continue  PPI  3-hyperglycemia/prediabetes -A1C 6.0 -CBG's elevated with use of steroids  -use SSI as per COVID protocol   4-allergy rhinitis -Continue loratadine.  5-insomnia -Continue as needed trazodone   DVT prophylaxis: Lovenox Code Status: Full code Family Communication: No family at bedside. Disposition Plan: Continue inpatient treatment with steroids, remdesivir and supportive care.  Patient will be transferred to [facility] for further care and management.  Wean off oxygen supplementation as tolerated.  Consultants:   None  Procedures:   See below for x-ray reports.  Antimicrobials:  Anti-infectives (From admission, onward)   Start     Dose/Rate Route Frequency Ordered Stop   11/20/19 1000  remdesivir 100 mg in sodium chloride 0.9 % 100 mL IVPB     100 mg 200 mL/hr over 30 Minutes Intravenous Daily 11/19/19 1955 11/24/19 0959   11/19/19 2030  remdesivir 200 mg in sodium chloride 0.9% 250 mL IVPB     200 mg 580 mL/hr over 30 Minutes Intravenous Once 11/19/19 1955 11/20/19 0034   11/19/19 1930  azithromycin (ZITHROMAX) 500 mg in sodium chloride 0.9 % 250 mL IVPB  Status:  Discontinued     500 mg 250 mL/hr over 60 Minutes Intravenous Every 24 hours 11/19/19 1840 11/19/19 2050   11/19/19 1900  cefTRIAXone (ROCEPHIN) 2 g in sodium chloride 0.9 % 100 mL IVPB  Status:  Discontinued     2 g 200 mL/hr over 30 Minutes Intravenous Every 24 hours 11/19/19 1840 11/19/19 2050       Subjective: Still complaining of shortness of breath, no nausea, no vomiting.  Patient is currently afebrile.  Using 2 L nasal cannula supplementation.  Objective: Vitals:   11/20/19 0730 11/20/19 0745  11/20/19 0800 11/20/19 0806  BP: 113/80  115/79   Pulse: 69 67 76   Resp: 20 (!) 22    Temp:    97.9 F (36.6 C)  TempSrc:    Oral  SpO2: 94% 92% 95%   Weight:      Height:       No intake or output data in the 24 hours ending 11/20/19 0818 Filed Weights   11/19/19 1602  Weight:  86.2 kg    Examination: General exam: Alert, awake, oriented x 3; currently afebrile; still complaining of shortness of breath and is using 2 L nasal cannula supplementation.  No chest pain, no nausea, no vomiting. Respiratory system: Bilateral rhonchi, no using accessory muscles, no wheezing at this time. Respiratory effort normal. Cardiovascular system:RRR. No murmurs, rubs, gallops. Gastrointestinal system: Abdomen is nondistended, soft and nontender. No organomegaly or masses felt. Normal bowel sounds heard. Central nervous system: Alert and oriented. No focal neurological deficits. Extremities: No cyanosis or clubbing. Skin: No rashes, lesions or ulcers Psychiatry: Judgement and insight appear normal. Mood & affect appropriate.     Data Reviewed: I have personally reviewed following labs and imaging studies  CBC: Recent Labs  Lab 11/19/19 1638  WBC 11.4*  NEUTROABS 9.8*  HGB 16.6  HCT 48.1  MCV 96.4  PLT 505   Basic Metabolic Panel: Recent Labs  Lab 11/19/19 1638 11/20/19 0544  NA 135 136  K 3.7 4.3  CL 101 100  CO2 25 24  GLUCOSE 124* 145*  BUN 17 20  CREATININE 1.00 1.04  CALCIUM 8.5* 8.8*  MG  --  2.0  PHOS  --  3.9   GFR: Estimated Creatinine Clearance: 81.7 mL/min (by C-G formula based on SCr of 1.04 mg/dL).   Liver Function Tests: Recent Labs  Lab 11/19/19 1638 11/20/19 0544  AST 19 22  ALT 23 22  ALKPHOS 72 70  BILITOT 0.5 0.7  PROT 7.1 7.2  ALBUMIN 3.7 3.7   Coagulation Profile: Recent Labs  Lab 11/19/19 1638  INR 1.0   HbA1C: Recent Labs    11/19/19 1638  HGBA1C 6.0*   CBG: Recent Labs  Lab 11/20/19 0758  GLUCAP 108*   Anemia Panel: Recent Labs    11/19/19 1638 11/20/19 0544  FERRITIN 107 116   Urine analysis:    Component Value Date/Time   COLORURINE YELLOW 11/19/2019 1622   APPEARANCEUR CLEAR 11/19/2019 1622   LABSPEC 1.027 11/19/2019 1622   PHURINE 5.0 11/19/2019 1622   GLUCOSEU NEGATIVE 11/19/2019 1622    HGBUR NEGATIVE 11/19/2019 1622   BILIRUBINUR NEGATIVE 11/19/2019 1622   KETONESUR NEGATIVE 11/19/2019 1622   PROTEINUR 30 (A) 11/19/2019 1622   UROBILINOGEN 0.2 11/06/2014 2236   NITRITE NEGATIVE 11/19/2019 1622   LEUKOCYTESUR NEGATIVE 11/19/2019 1622   Radiology Studies: DG Chest Port 1 View  Result Date: 11/19/2019 CLINICAL DATA:  Fever, cough, presumed COVID-19 positive EXAM: PORTABLE CHEST 1 VIEW COMPARISON:  CTA chest 12/04/2018, radiograph 11/18/2019 FINDINGS: Rapidly worsening multifocal interstitial and airspace opacities with a peripheral predominance. No pneumothorax or effusion. The cardiomediastinal contours are unremarkable. No acute osseous or soft tissue abnormality. IMPRESSION: Rapidly worsening multifocal interstitial and airspace opacity compatible with a viral pneumonia, less likely edema. Electronically Signed   By: Lovena Le M.D.   On: 11/19/2019 17:07   Scheduled Meds: . albuterol  2 puff Inhalation Q6H  . vitamin C  500 mg Oral Daily  . aspirin  325 mg Oral Daily  . enoxaparin (LOVENOX)  injection  40 mg Subcutaneous Q24H  . folic acid  1 mg Oral Daily  . guaiFENesin  600 mg Oral BID  . insulin aspart  0-5 Units Subcutaneous QHS  . insulin aspart  0-6 Units Subcutaneous TID WC  . loratadine  10 mg Oral Daily  . methylPREDNISolone (SOLU-MEDROL) injection  40 mg Intravenous Q12H  . multivitamin with minerals  1 tablet Oral Daily  . mupirocin ointment  1 application Topical BID  . pantoprazole  40 mg Oral Daily  . sodium chloride flush  3 mL Intravenous Q12H  . thiamine  100 mg Oral Daily  . zinc sulfate  220 mg Oral Daily   Continuous Infusions: . sodium chloride    . remdesivir 100 mg in NS 100 mL       LOS: 1 day    Time spent: 35 minutes. Greater than 50% of this time was spent in direct contact with the patient, coordinating care and discussing relevant ongoing clinical issues, including sepsis due to COVID-19 infection and need to be transfer to [place] in an effort to allocate our resources and ability for care. Patient is currently afebrile, without nausea or vomiting and denying CP. Still SOB and using 2L [place] supplementation.     Barton Dubois, MD [facility] Pager [phone]   11/20/2019, 8:18 AM

## 2019-11-20 NOTE — ED Notes (Signed)
Pt given meal tray.

## 2019-11-20 NOTE — ED Notes (Signed)
O2 placed on pt at 2 L via [place] for comfort

## 2019-11-21 LAB — GLUCOSE, CAPILLARY
Glucose-Capillary: 106 mg/dL — ABNORMAL HIGH (ref 70–99)
Glucose-Capillary: 92 mg/dL (ref 70–99)
Glucose-Capillary: 124 mg/dL — ABNORMAL HIGH (ref 70–99)
Glucose-Capillary: 91 mg/dL (ref 70–99)
Glucose-Capillary: 173 mg/dL — ABNORMAL HIGH (ref 70–99)

## 2019-11-21 MED ORDER — DEXAMETHASONE SODIUM PHOSPHATE 10 MG/ML IJ SOLN: 6.0000 mg | INTRAMUSCULAR | Status: DC

## 2019-11-21 MED ORDER — ACETAMINOPHEN 325 MG PO TABS
650.0000 mg | ORAL_TABLET | Freq: Four times a day (QID) | ORAL | Status: DC | PRN
  Administered 2019-11-21 – 2019-11-23 (×2): 650 mg via ORAL
  Filled 2019-11-21 (×3): qty 2

## 2019-11-21 MED ORDER — DEXAMETHASONE SODIUM PHOSPHATE 10 MG/ML IJ SOLN
6.0000 mg | INTRAMUSCULAR | Status: DC
  Administered 2019-11-21: 6 mg via INTRAVENOUS
  Filled 2019-11-21: qty 1

## 2019-11-21 NOTE — Plan of Care (Signed)

## 2019-11-21 NOTE — Progress Notes (Signed)
[name]  [id number] DOB: 05/09/1961 DOA: 11/19/2019 PCP: Dettinger, [name], MD    Brief Narrative:  58 year old with a history of GERD who presented to the [facility] ED with cough, fever, myalgia, fatigue, and shortness of breath since the last week of November.  The patient actually had negative Covid test on December 1 and again December 13 at outpatient urgent care facilities.  He presented to the ED 12/14 at which time a CXR noted bilateral infiltrates and a Covid test was positive.  Significant Events: 12/14 admit to [facility] via ED 12/15 transfer to [facility]  COVID-19 specific Treatment: Remdesivir. Decadron  Subjective: Oxygen saturations presently stable on minimal support.  Tells me he feels better today.  Denies chest pain nausea vomiting or abdominal pain.  Still feels very weak in general.  Reports that he feels somewhat unstable on his feet  Assessment & Plan:  COVID Pneumonia -sepsis present on admission Continue remdesivir and Decadron -wean oxygen as able -mobilize -PT OT  Recent Labs  Lab 11/19/19 1638 11/20/19 0544  DDIMER 0.46 0.54*  FERRITIN 107 116  CRP 0.7 1.0*  ALT 23 22  PROCALCITON <0.10 0.12    GERD Continue PPI  Hyperglycemia /prediabetes A1c 6.0 -CBGs reasonably controlled at present -follow trend  Allergic rhinitis Well-controlled at this time  Insomnia As needed medications provided  DVT prophylaxis: Lovenox Code Status: FULL CODE Family Communication:  Disposition Plan: Discontinue telemetry -mobilize -wean oxygen as able  Consultants:  none  Antimicrobials:  Azithromycin 12/14 Rocephin 12/14  Objective: Blood pressure 128/84, pulse 68, temperature 98 F (36.7 C), temperature source Oral, resp. rate 18, height 5\' 6"  (1.676 m), weight 86.2 kg, SpO2 94 %.  Intake/Output Summary (Last 24 hours) at 11/21/2019 0839 Last data filed at 11/21/2019 [id number] Gross per 24 hour  Intake 130 ml  Output 1075 ml  Net  -945 ml   Filed Weights   11/19/19 1602  Weight: 86.2 kg    Examination: General: No acute respiratory distress Lungs: Fine crackles throughout without wheezing Cardiovascular: Regular rate and rhythm without murmur gallop or rub normal S1 and S2 Abdomen: Nontender, nondistended, soft, bowel sounds positive, no rebound, no ascites, no appreciable mass Extremities: No significant cyanosis, clubbing, or edema bilateral lower extremities  CBC: Recent Labs  Lab 11/19/19 1638  WBC 11.4*  NEUTROABS 9.8*  HGB 16.6  HCT 48.1  MCV 96.4  PLT [id number]   Basic Metabolic Panel: Recent Labs  Lab 11/19/19 1638 11/20/19 0544  NA 135 136  K 3.7 4.3  CL 101 100  CO2 25 24  GLUCOSE 124* 145*  BUN 17 20  CREATININE 1.00 1.04  CALCIUM 8.5* 8.8*  MG  --  2.0  PHOS  --  3.9   GFR: Estimated Creatinine Clearance: 81.7 mL/min (by C-G formula based on SCr of 1.04 mg/dL).  Liver Function Tests: Recent Labs  Lab 11/19/19 1638 11/20/19 0544  AST 19 22  ALT 23 22  ALKPHOS 72 70  BILITOT 0.5 0.7  PROT 7.1 7.2  ALBUMIN 3.7 3.7    Coagulation Profile: Recent Labs  Lab 11/19/19 1638  INR 1.0    HbA1C: Hgb A1c MFr Bld  Date/Time Value Ref Range Status  11/19/2019 04:38 PM 6.0 (H) 4.8 - 5.6 % Final    Comment:    (NOTE) Pre diabetes:          5.7%-6.4% Diabetes:              >  6.4% Glycemic control for   <7.0% adults with diabetes   12/21/2017 03:14 PM 5.4 4.8 - 5.6 % Final    Comment:             Prediabetes: 5.7 - 6.4          Diabetes: >6.4          Glycemic control for adults with diabetes: <7.0     CBG: Recent Labs  Lab 11/20/19 0758 11/20/19 1120 11/20/19 1606 11/21/19 0047 11/21/19 0738  GLUCAP 108* 200* 105* 106* 92    Recent Results (from the past 240 hour(s))  Urine culture     Status: None   Collection Time: 11/19/19  4:22 PM   Specimen: In/Out Cath Urine  Result Value Ref Range Status   Specimen Description   Final    IN/OUT CATH URINE Performed  at [facility], [address] [place], [place] [address]    Special Requests   Final    NONE Performed at [facility], [address], [place], [place] [address]    Culture   Final    NO GROWTH Performed at [facility], [place] [address] [place], [place] [address]    Report Status 11/20/2019 FINAL  Final  Blood Culture (routine x 2)     Status: None (Preliminary result)   Collection Time: 11/19/19  4:39 PM   Specimen: BLOOD RIGHT HAND  Result Value Ref Range Status   Specimen Description BLOOD RIGHT HAND  Final   Special Requests   Final    BOTTLES DRAWN AEROBIC AND ANAEROBIC Blood Culture adequate volume   Culture   Final    NO GROWTH 2 DAYS Performed at [facility], [address], [place], [place] [address]    Report Status PENDING  Incomplete  Blood Culture (routine x 2)     Status: None (Preliminary result)   Collection Time: 11/19/19  4:40 PM   Specimen: BLOOD  Result Value Ref Range Status   Specimen Description BLOOD LEFT ANTECUBITAL  Final   Special Requests   Final    BOTTLES DRAWN AEROBIC AND ANAEROBIC Blood Culture adequate volume   Culture   Final    NO GROWTH 2 DAYS Performed at [facility], [address], [place], [place] [address]    Report Status PENDING  Incomplete     Scheduled Meds: . albuterol  2 puff Inhalation Q6H  . vitamin C  500 mg Oral Daily  . aspirin  325 mg Oral Daily  . enoxaparin (LOVENOX) injection  40 mg Subcutaneous Q24H  . folic acid  1 mg Oral Daily  . guaiFENesin  600 mg Oral BID  . insulin aspart  0-5 Units Subcutaneous QHS  . insulin aspart  0-6 Units Subcutaneous TID WC  . loratadine  10 mg Oral Daily  . methylPREDNISolone (SOLU-MEDROL) injection  40 mg Intravenous Q12H  . multivitamin with minerals  1 tablet Oral Daily  . mupirocin ointment  1 application Topical BID  . pantoprazole  40 mg Oral Daily  . sodium chloride flush  3 mL Intravenous Q12H  . thiamine  100 mg Oral Daily  . zinc sulfate  220 mg Oral Daily    Continuous Infusions: . sodium chloride    . remdesivir 100 mg in NS 100 mL Stopped (11/20/19 1054)     LOS: 2 days   Cherene Altes, MD [facility] Office  [phone] Pager - Text Page per Amion  If 7PM-7AM, please contact  night-coverage per Amion 11/21/2019, 8:39 AM

## 2019-11-21 NOTE — Progress Notes (Signed)
Report given to [facility]. Patient transferred to 3rd floor.  Family will be made aware.

## 2019-11-22 LAB — COMPREHENSIVE METABOLIC PANEL
ALT: 20 U/L (ref 0–44)
AST: 19 U/L (ref 15–41)
Albumin: 3.3 g/dL — ABNORMAL LOW (ref 3.5–5.0)
Alkaline Phosphatase: 62 U/L (ref 38–126)
Anion gap: 10 (ref 5–15)
BUN: 26 mg/dL — ABNORMAL HIGH (ref 6–20)
CO2: 29 mmol/L (ref 22–32)
Calcium: 8.7 mg/dL — ABNORMAL LOW (ref 8.9–10.3)
Chloride: 98 mmol/L (ref 98–111)
Creatinine, Ser: 0.82 mg/dL (ref 0.61–1.24)
GFR calc Af Amer: >60 mL/min (ref >60)
GFR calc non Af Amer: >60 mL/min (ref >60)
Glucose, Bld: 97 mg/dL (ref 70–99)
Potassium: 4.7 mmol/L (ref 3.5–5.1)
Sodium: 137 mmol/L (ref 135–145)
Total Bilirubin: 0.6 mg/dL (ref 0.3–1.2)
Total Protein: 6.5 g/dL (ref 6.5–8.1)

## 2019-11-22 LAB — CBC
HCT: 47.9 % (ref 39.0–52.0)
Hemoglobin: 16.4 g/dL (ref 13.0–17.0)
MCH: 33 pg (ref 26.0–34.0)
MCHC: 34.2 g/dL (ref 30.0–36.0)
MCV: 96.4 fL (ref 80.0–100.0)
Platelets: 200 10*3/uL (ref 150–400)
RBC: 4.97 MIL/uL (ref 4.22–5.81)
RDW: 12.2 % (ref 11.5–15.5)
WBC: 8.5 10*3/uL (ref 4.0–10.5)
nRBC: 0 % (ref 0.0–0.2)

## 2019-11-22 LAB — GLUCOSE, CAPILLARY
Glucose-Capillary: 81 mg/dL (ref 70–99)
Glucose-Capillary: 89 mg/dL (ref 70–99)
Glucose-Capillary: 128 mg/dL — ABNORMAL HIGH (ref 70–99)

## 2019-11-22 LAB — C-REACTIVE PROTEIN: CRP: 0.8 mg/dL (ref <1.0)

## 2019-11-22 LAB — D-DIMER, QUANTITATIVE: D-Dimer, Quant: 0.39 ug/mL-FEU (ref 0.00–0.50)

## 2019-11-22 LAB — FERRITIN: Ferritin: 144 ng/mL (ref 24–336)

## 2019-11-22 NOTE — Progress Notes (Signed)
Pt resting in bed with no complaints. VSS. Pt has spoken with family to update on possible d/c Friday per MD.

## 2019-11-22 NOTE — Evaluation (Signed)
Physical Therapy Evaluation Patient Details Name: [name] MRN: [id number] DOB: Jan 14, 1961 Today's Date: 11/22/2019   History of Present Illness  58 year old with a history of GERD who presented to the [facility] ED with cough, fever, myalgia, fatigue, and shortness of breath since the last week of November.  The patient actually had negative Covid test on December 1 and again December 13 at outpatient urgent care facilities.  He presented to the ED 12/14 at which time a CXR noted bilateral infiltrates and a Covid test was positive.  Clinical Impression  The patient is independent, ambulated x 300' on RA, SPO2 985. No further PT needs at this time. PT will sign off.    Follow Up Recommendations No PT follow up    Equipment Recommendations  None recommended by PT    Recommendations for Other Services       Precautions / Restrictions Precautions Precautions: None      Mobility  Bed Mobility Overal bed mobility: Independent                Transfers Overall transfer level: Independent                  Ambulation/Gait Ambulation/Gait assistance: Independent Gait Distance (Feet): 300 Feet Assistive device: None Gait Pattern/deviations: Step-through pattern   Gait velocity interpretation: >2.62 ft/sec, indicative of community ambulatory    Stairs            Wheelchair Mobility    Modified Rankin (Stroke Patients Only)       Balance Overall balance assessment: No apparent balance deficits (not formally assessed)                                           Pertinent Vitals/Pain      Home Living Family/patient expects to be discharged to:: Private residence Living Arrangements: Spouse/significant other Available Help at Discharge: Family Type of Home: Mobile home Home Access: Stairs to enter   Entrance Stairs-Number of Steps: 1 Home Layout: One level Home Equipment: Bedside commode;Walker - 2 wheels;Cane - single  point      Prior Function Level of Independence: Independent         Comments: Pt independent with ADL, IADLs, and mobility. Pt does not ambulate with an assistive device. Pt reports 0 falls in the last 6 months. Pt does not use oxygen at home. Pt works 60+ hours per week between 3 jobs.      Hand Dominance        Extremity/Trunk Assessment   Upper Extremity Assessment Upper Extremity Assessment: Overall WFL for tasks assessed    Lower Extremity Assessment Lower Extremity Assessment: Overall WFL for tasks assessed    Cervical / Trunk Assessment Cervical / Trunk Assessment: Normal  Communication      Cognition Arousal/Alertness: Awake/alert Behavior During Therapy: WFL for tasks assessed/performed Overall Cognitive Status: Within Functional Limits for tasks assessed                                        General Comments      Exercises     Assessment/Plan    PT Assessment Patent does not need any further PT services  PT Problem List         PT Treatment Interventions  PT Goals (Current goals can be found in the Care Plan section)  Acute Rehab PT Goals Patient Stated Goal: go home PT Goal Formulation: All assessment and education complete, DC therapy    Frequency     Barriers to discharge        Co-evaluation               AM-PAC PT "6 Clicks" Mobility  Outcome Measure Help needed turning from your back to your side while in a flat bed without using bedrails?: None Help needed moving from lying on your back to sitting on the side of a flat bed without using bedrails?: None Help needed moving to and from a bed to a chair (including a wheelchair)?: None Help needed standing up from a chair using your arms (e.g., wheelchair or bedside chair)?: None Help needed to walk in hospital room?: None Help needed climbing 3-5 steps with a railing? : None 6 Click Score: 24    End of Session   Activity Tolerance: Patient tolerated  treatment well Patient left: in bed;with call bell/phone within reach Nurse Communication: Mobility status PT Visit Diagnosis: Difficulty in walking, not elsewhere classified (R26.2)    Time: [id number] PT Time Calculation (min) (ACUTE ONLY): 16 min   Charges:   PT Evaluation $PT Eval Low Complexity: [place] Pager [phone] Office [phone]   Claretha Cooper 11/22/2019, 1:42 PM

## 2019-11-22 NOTE — Progress Notes (Signed)
Occupational Therapy Evaluation Only Patient Details Name: [name] MRN: [id number] DOB: May 12, 1961 Today's Date: 11/22/2019    History of Present Illness 58 year old with a history of GERD who presented to the [facility] ED with cough, fever, myalgia, fatigue, and shortness of breath since the last week of November.  The patient actually had negative Covid test on December 1 and again December 13 at outpatient urgent care facilities.  He presented to the ED 12/14 at which time a CXR noted bilateral infiltrates and a Covid test was positive.   Clinical Impression   PTA pt lived with his wife, independent in ADL, IADL, and mobility tasks. Pt still drives and works [id number] hours per week. Pt currently independent in all self-care and functional transfer tasks. Pt able to ambulate around room, to/from bathroom, and to bedside chair without difficulty. Pt completed dressing, toileting, and grooming/hygiene tasks independently, noting 0 instances of loss of balance. 0/4 DOE. SpO2 maintained in 90s throughout on room air. Educated and provided pt with handout regarding energy conservation strategies. Educated pt on safety strategies and activity modifications for ADLs and IADLs noting good understanding. No further skilled OT services warranted at this time as pt is functioning near baseline for self-care and functional transfer tasks.     Follow Up Recommendations  No OT follow up    Equipment Recommendations  None recommended by OT    Recommendations for Other Services       Precautions / Restrictions Precautions Precautions: None Restrictions Weight Bearing Restrictions: No      Mobility Bed Mobility Overal bed mobility: Independent             General bed mobility comments: HOB flat, without use of bedrails  Transfers Overall transfer level: Independent Equipment used: None             General transfer comment: Pt ambulated around room, to/from bathroom, and to  bedside chair without difficulty. Noted 0 instances of LOB    Balance Overall balance assessment: No apparent balance deficits (not formally assessed)                                         ADL either performed or assessed with clinical judgement   ADL Overall ADL's : Independent                                       General ADL Comments: Pt independent with dressing, toileting, grooming/hygiene tasks at the sink. No difficulties noted throughout. Pt demonstrates good balance and safety.     Vision Baseline Vision/History: Wears glasses Wears Glasses: At all times       Perception     Praxis      Pertinent Vitals/Pain Pain Assessment: No/denies pain     Hand Dominance Right   Extremity/Trunk Assessment Upper Extremity Assessment Upper Extremity Assessment: Overall WFL for tasks assessed   Lower Extremity Assessment Lower Extremity Assessment: Defer to PT evaluation       Communication Communication Communication: No difficulties   Cognition Arousal/Alertness: Awake/alert Behavior During Therapy: WFL for tasks assessed/performed Overall Cognitive Status: Within Functional Limits for tasks assessed  General Comments  SpO2 maintained in 90s throughout on RA. No signs/symptoms of distress.     Exercises Exercises: Other exercises Other Exercises Other Exercises: Incentive spirometer x 10. Pulling 1055mL   Shoulder Instructions      Home Living Family/patient expects to be discharged to:: Private residence Living Arrangements: Spouse/significant other Available Help at Discharge: Family Type of Home: Mobile home Home Access: Stairs to enter Entrance Stairs-Number of Steps: 1   Home Layout: One level     Bathroom Shower/Tub: Walk-in shower;Tub/shower unit   Bathroom Toilet: Standard     Home Equipment: Bedside commode;Walker - 2 wheels;Cane - single point           Prior Functioning/Environment Level of Independence: Independent        Comments: Pt independent with ADL, IADLs, and mobility. Pt does not ambulate with an assistive device. Pt reports 0 falls in the last 6 months. Pt does not use oxygen at home. Pt works 60+ hours per week between 3 jobs.         OT Problem List: Cardiopulmonary status limiting activity      OT Treatment/Interventions:      OT Goals(Current goals can be found in the care plan section)    OT Frequency:     Barriers to D/C:            Co-evaluation              AM-PAC OT "6 Clicks" Daily Activity     Outcome Measure Help from another person eating meals?: None Help from another person taking care of personal grooming?: None Help from another person toileting, which includes using toliet, bedpan, or urinal?: None Help from another person bathing (including washing, rinsing, drying)?: None Help from another person to put on and taking off regular upper body clothing?: None Help from another person to put on and taking off regular lower body clothing?: None 6 Click Score: 24   End of Session Equipment Utilized During Treatment: Other (comment)(none) Nurse Communication: Mobility status  Activity Tolerance: Patient tolerated treatment well Patient left: in chair;with call bell/phone within reach  OT Visit Diagnosis: Muscle weakness (generalized) (M62.81)                Time: [id number] OT Time Calculation (min): 25 min Charges:  OT General Charges $OT Visit: 1 Visit OT Evaluation $OT Eval Low Complexity: 1 Low OT Treatments $Self Care/Home Management : 8-22 mins  Mauri Brooklyn OTR/L [phone]   Mauri Brooklyn 11/22/2019, 9:44 AM

## 2019-11-22 NOTE — Progress Notes (Signed)
[name]  [id number] DOB: 09-14-1961 DOA: 11/19/2019 PCP: Dettinger, [name], MD    Brief Narrative:  58 year old with a history of GERD who presented to the [facility] ED with cough, fever, myalgia, fatigue, and shortness of breath since the last week of November.  The patient actually had negative Covid test on December 1 and again December 13 at outpatient urgent care facilities.  He presented to the ED 12/14 at which time a CXR noted bilateral infiltrates and a Covid test was positive.  Significant Events: 12/14 admit to [facility] via ED 12/15 transfer to [facility]  COVID-19 specific Treatment: Remdesivir 12/14 > 12/18 Decadron 12/16 > Solu-Medrol 12/14 > 12/16  Subjective: Has been successfully weaned to room air.  No new complaints today.  Appetite improving.  Patient found out today his wife is also positive but she is reportedly feeling well.  Assessment & Plan:  COVID Pneumonia -sepsis present on admission Continue remdesivir and Decadron -wean oxygen as able -mobilize -PT OT -anticipate discharge home 12/18  Recent Labs  Lab 11/19/19 1638 11/20/19 0544 11/22/19 0503  DDIMER 0.46 0.54* 0.39  FERRITIN 107 116 144  CRP 0.7 1.0* 0.8  ALT 23 22 20   PROCALCITON <0.10 0.12  --     GERD Continue PPI  Hyperglycemia /prediabetes A1c 6.0 -CBGs controlled at present -follow trend  Allergic rhinitis Well-controlled  Insomnia As needed medications provided  DVT prophylaxis: Lovenox Code Status: FULL CODE Family Communication:  Disposition Plan: Anticipate discharge home 12/18  Consultants:  none  Antimicrobials:  Azithromycin 12/14 Rocephin 12/14  Objective: Blood pressure 112/78, pulse 72, temperature 98.1 F (36.7 C), temperature source Oral, resp. rate 20, height 5\' 6"  (1.676 m), weight 86.2 kg, SpO2 98 %.  Intake/Output Summary (Last 24 hours) at 11/22/2019 1727 Last data filed at 11/22/2019 1623 Gross per 24 hour  Intake 926 ml    Output 0 ml  Net 926 ml   Filed Weights   11/19/19 1602  Weight: 86.2 kg    Examination: General: No acute respiratory distress Lungs: Clear to auscultation Cardiovascular: RRR Abdomen: NT/ND, soft, BS positive Extremities: No edema bilateral lower extremities  CBC: Recent Labs  Lab 11/19/19 1638 11/22/19 0503  WBC 11.4* 8.5  NEUTROABS 9.8*  --   HGB 16.6 16.4  HCT 48.1 47.9  MCV 96.4 96.4  PLT 209 [id number]   Basic Metabolic Panel: Recent Labs  Lab 11/19/19 1638 11/20/19 0544 11/22/19 0503  NA 135 136 137  K 3.7 4.3 4.7  CL 101 100 98  CO2 25 24 29   GLUCOSE 124* 145* 97  BUN 17 20 26*  CREATININE 1.00 1.04 0.82  CALCIUM 8.5* 8.8* 8.7*  MG  --  2.0  --   PHOS  --  3.9  --    GFR: Estimated Creatinine Clearance: 103.6 mL/min (by C-G formula based on SCr of 0.82 mg/dL).  Liver Function Tests: Recent Labs  Lab 11/19/19 1638 11/20/19 0544 11/22/19 0503  AST 19 22 19   ALT 23 22 20   ALKPHOS 72 70 62  BILITOT 0.5 0.7 0.6  PROT 7.1 7.2 6.5  ALBUMIN 3.7 3.7 3.3*    Coagulation Profile: Recent Labs  Lab 11/19/19 1638  INR 1.0    HbA1C: Hgb A1c MFr Bld  Date/Time Value Ref Range Status  11/19/2019 04:38 PM 6.0 (H) 4.8 - 5.6 % Final    Comment:    (NOTE) Pre diabetes:  5.7%-6.4% Diabetes:              >6.4% Glycemic control for   <7.0% adults with diabetes   12/21/2017 03:14 PM 5.4 4.8 - 5.6 % Final    Comment:             Prediabetes: 5.7 - 6.4          Diabetes: >6.4          Glycemic control for adults with diabetes: <7.0     CBG: Recent Labs  Lab 11/21/19 1137 11/21/19 1630 11/21/19 2023 11/22/19 0833 11/22/19 1210  GLUCAP 124* 91 173* 81 89    Recent Results (from the past 240 hour(s))  Urine culture     Status: None   Collection Time: 11/19/19  4:22 PM   Specimen: In/Out Cath Urine  Result Value Ref Range Status   Specimen Description   Final    IN/OUT CATH URINE Performed at [facility], [address]  [place], [place] [address]    Special Requests   Final    NONE Performed at [facility], [address] [place], [place] [address]    Culture   Final    NO GROWTH Performed at [facility], [place] [address], [place], [place] [address]    Report Status 11/20/2019 FINAL  Final  Blood Culture (routine x 2)     Status: None (Preliminary result)   Collection Time: 11/19/19  4:39 PM   Specimen: BLOOD RIGHT HAND  Result Value Ref Range Status   Specimen Description BLOOD RIGHT HAND  Final   Special Requests   Final    BOTTLES DRAWN AEROBIC AND ANAEROBIC Blood Culture adequate volume   Culture   Final    NO GROWTH 3 DAYS Performed at [facility], [address] [place], [place] [address]    Report Status PENDING  Incomplete  Blood Culture (routine x 2)     Status: None (Preliminary result)   Collection Time: 11/19/19  4:40 PM   Specimen: BLOOD  Result Value Ref Range Status   Specimen Description BLOOD LEFT ANTECUBITAL  Final   Special Requests   Final    BOTTLES DRAWN AEROBIC AND ANAEROBIC Blood Culture adequate volume   Culture   Final    NO GROWTH 3 DAYS Performed at [facility], [address] [place], [place] [address]    Report Status PENDING  Incomplete     Scheduled Meds: . vitamin C  500 mg Oral Daily  . aspirin  325 mg Oral Daily  . enoxaparin (LOVENOX) injection  40 mg Subcutaneous Q24H  . folic acid  1 mg Oral Daily  . guaiFENesin  600 mg Oral BID  . loratadine  10 mg Oral Daily  . multivitamin with minerals  1 tablet Oral Daily  . mupirocin ointment  1 application Topical BID  . pantoprazole  40 mg Oral Daily  . thiamine  100 mg Oral Daily  . zinc sulfate  220 mg Oral Daily   Continuous Infusions: . remdesivir 100 mg in NS 100 mL Stopped (11/22/19 1001)     LOS: 3 days   Cherene Altes, MD [facility] Office  [phone] Pager - Text Page per Amion  If 7PM-7AM, please contact night-coverage per Amion 11/22/2019, 5:27 PM

## 2019-11-22 NOTE — Progress Notes (Signed)
Pt resting in recliner with no complaints. Remidisivir infusion #4 complete with no complications. VSS. No pain reported at this time. Will continue to monitor.

## 2019-11-22 NOTE — Plan of Care (Signed)

## 2019-11-22 NOTE — Plan of Care (Signed)

## 2019-11-23 LAB — CBC
HCT: 46.9 % (ref 39.0–52.0)
Hemoglobin: 16 g/dL (ref 13.0–17.0)
MCH: 33.1 pg (ref 26.0–34.0)
MCHC: 34.1 g/dL (ref 30.0–36.0)
MCV: 96.9 fL (ref 80.0–100.0)
Platelets: 204 10*3/uL (ref 150–400)
RBC: 4.84 MIL/uL (ref 4.22–5.81)
RDW: 12.4 % (ref 11.5–15.5)
WBC: 10.4 10*3/uL (ref 4.0–10.5)
nRBC: 0 % (ref 0.0–0.2)

## 2019-11-23 LAB — COMPREHENSIVE METABOLIC PANEL
ALT: 28 U/L (ref 0–44)
AST: 23 U/L (ref 15–41)
Albumin: 3.3 g/dL — ABNORMAL LOW (ref 3.5–5.0)
Alkaline Phosphatase: 58 U/L (ref 38–126)
Anion gap: 10 (ref 5–15)
BUN: 23 mg/dL — ABNORMAL HIGH (ref 6–20)
CO2: 29 mmol/L (ref 22–32)
Calcium: 8.3 mg/dL — ABNORMAL LOW (ref 8.9–10.3)
Chloride: 100 mmol/L (ref 98–111)
Creatinine, Ser: 0.88 mg/dL (ref 0.61–1.24)
GFR calc Af Amer: >60 mL/min (ref >60)
GFR calc non Af Amer: >60 mL/min (ref >60)
Glucose, Bld: 79 mg/dL (ref 70–99)
Potassium: 4 mmol/L (ref 3.5–5.1)
Sodium: 139 mmol/L (ref 135–145)
Total Bilirubin: 0.7 mg/dL (ref 0.3–1.2)
Total Protein: 6.2 g/dL — ABNORMAL LOW (ref 6.5–8.1)

## 2019-11-23 LAB — MAGNESIUM: Magnesium: 2.3 mg/dL (ref 1.7–2.4)

## 2019-11-23 MED ORDER — DEXAMETHASONE 6 MG PO TABS
6.0000 mg | ORAL_TABLET | Freq: Every day | ORAL | Status: DC
  Administered 2019-11-23: 6 mg via ORAL
  Filled 2019-11-23: qty 1

## 2019-11-23 MED ORDER — ACETAMINOPHEN 325 MG PO TABS: 650.0000 mg | ORAL_TABLET | Freq: Four times a day (QID) | ORAL | Status: DC | PRN

## 2019-11-23 MED ORDER — DEXAMETHASONE 6 MG PO TABS: 6.0000 mg | ORAL_TABLET | Freq: Every day | ORAL | 0 refills | Status: AC

## 2019-11-23 NOTE — Discharge Summary (Signed)
DISCHARGE SUMMARY  [name]  MR#: [id number]  DOB:12/06/1961  Date of Admission: 11/19/2019 Date of Discharge: 11/23/2019  Attending Physician:Jilliane Kazanjian Hennie Duos, MD  Patient's [id number], Nathan Kaufmann, MD  Consults: none  Disposition: D/C home   Date of Positive COVID Test: 11/19/2019  Date Quarantine Ends: 12/10/2019  COVID-19 specific Treatment: Remdesivir 12/14 > 12/18 Decadron 12/16 > 12/23 Solu-Medrol 12/14 > 12/16  Follow-up Appts: Follow-up Information    Dettinger, Nathan Kaufmann, MD Follow up in 1 week(s).   Specialties: Family Medicine, Cardiology Contact information: [place] [address] [phone]           Tests Needing Follow-up: -assess CBG in pt w/ pre-DM -assure pt continues to improve in regard to exertional tolerance   Discharge Diagnoses: COVID Pneumonia Acute hypoxic respiratory failure Sepsis present on admission GERD Hyperglycemia / prediabetes Allergic rhinitis Insomnia  Initial presentation: 58 year old with a history of GERD who presented to the [facility] ED with cough, fever, myalgia, fatigue, and shortness of breath since the last week of November.  The patient actually had negative Covid tests on December 1 and again December 13 at outpatient urgent care facilities. He presented to the ED 12/14 at which time a CXR noted bilateral infiltrates and a Covid test was positive.  Hospital Course: 12/14 admit to [facility] via ED 12/15 transfer to [facility] 12/18 D/C home   COVID Pneumonia -sepsis present on admission -acute hypoxic respiratory failure Completed a course of remdesivir - cont decadron after d/c to complete 10 days pf steroid tx  -weaned to RA prior to d/c home - PT/OT evaluated - discharge home 12/18  GERD Continue PPI  Hyperglycemia /pre-diabetes A1c 6.0 -CBGs controlled at present -follow trend   Allergic rhinitis Well-controlled  Insomnia As needed medications provided during  hospital stay   Allergies as of 11/23/2019      Reactions   Morphine And Related Swelling   Bee Venom Rash      Medication List    STOP taking these medications   Advil PM 200-25 MG Caps Generic drug: Ibuprofen-diphenhydrAMINE HCl   azithromycin 250 MG tablet Commonly known as: ZITHROMAX   doxycycline 100 MG tablet Commonly known as: VIBRA-TABS   ondansetron 8 MG disintegrating tablet Commonly known as: ZOFRAN-ODT     TAKE these medications   acetaminophen 325 MG tablet Commonly known as: TYLENOL Take 2 tablets (650 mg total) by mouth every 6 (six) hours as needed for mild pain, moderate pain, fever or headache.   benzonatate 200 MG capsule Commonly known as: TESSALON Take 1 capsule (200 mg total) by mouth 3 (three) times daily as needed.   dexamethasone 6 MG tablet Commonly known as: DECADRON Take 1 tablet (6 mg total) by mouth daily for 5 days. Start taking on: November 24, 2019   pantoprazole 40 MG tablet Commonly known as: PROTONIX TAKE ONE (1) TABLET EACH DAY What changed: See the new instructions.   ProAir HFA 108 (90 Base) MCG/ACT inhaler Generic drug: albuterol USE 2 PUFFS EVERY 6 HOURS AS NEEDED What changed: See the new instructions.       Day of Discharge BP 101/78 (BP Location: Left Arm)   Pulse 69   Temp 99.2 F (37.3 C) (Oral)   Resp (!) 21   Ht 5\' 6"  (1.676 m)   Wt 86.2 kg   SpO2 97%   BMI 30.67 kg/m   Physical Exam: General: No acute respiratory distress Lungs: Clear to auscultation bilaterally without  wheezes or crackles Cardiovascular: Regular rate and rhythm without murmur gallop or rub normal S1 and S2 Abdomen: Nontender, nondistended, soft, bowel sounds positive, no rebound, no ascites, no appreciable mass Extremities: No significant cyanosis, clubbing, or edema bilateral lower extremities  Basic Metabolic Panel: Recent Labs  Lab 11/19/19 1638 11/20/19 0544 11/22/19 0503 11/23/19 0320  NA 135 136 137 139  K 3.7 4.3 4.7  4.0  CL 101 100 98 100  CO2 25 24 29 29   GLUCOSE 124* 145* 97 79  BUN 17 20 26* 23*  CREATININE 1.00 1.04 0.82 0.88  CALCIUM 8.5* 8.8* 8.7* 8.3*  MG  --  2.0  --  2.3  PHOS  --  3.9  --   --     Liver Function Tests: Recent Labs  Lab 11/19/19 1638 11/20/19 0544 11/22/19 0503 11/23/19 0320  AST 19 22 19 23   ALT 23 22 20 28   ALKPHOS 72 70 62 58  BILITOT 0.5 0.7 0.6 0.7  PROT 7.1 7.2 6.5 6.2*  ALBUMIN 3.7 3.7 3.3* 3.3*   Coags: Recent Labs  Lab 11/19/19 1638  INR 1.0   CBC: Recent Labs  Lab 11/19/19 1638 11/22/19 0503 11/23/19 0320  WBC 11.4* 8.5 10.4  NEUTROABS 9.8*  --   --   HGB 16.6 16.4 16.0  HCT 48.1 47.9 46.9  MCV 96.4 96.4 96.9  PLT 209 200 204    CBG: Recent Labs  Lab 11/21/19 1630 11/21/19 2023 11/22/19 0833 11/22/19 1210 11/22/19 2135  GLUCAP 91 173* 81 89 128*    Recent Results (from the past 240 hour(s))  Urine culture     Status: None   Collection Time: 11/19/19  4:22 PM   Specimen: In/Out Cath Urine  Result Value Ref Range Status   Specimen Description   Final    IN/OUT CATH URINE Performed at [facility], [address] [place], [place] [address]    Special Requests   Final    NONE Performed at [facility], [address], [place], [place] [address]    Culture   Final    NO GROWTH Performed at [facility], [place] [address] [place], [place] [address]    Report Status 11/20/2019 FINAL  Final  Blood Culture (routine x 2)     Status: None (Preliminary result)   Collection Time: 11/19/19  4:39 PM   Specimen: BLOOD RIGHT HAND  Result Value Ref Range Status   Specimen Description BLOOD RIGHT HAND  Final   Special Requests   Final    BOTTLES DRAWN AEROBIC AND ANAEROBIC Blood Culture adequate volume   Culture   Final    NO GROWTH 4 DAYS Performed at [facility], [address], [place], [place] [address]    Report Status PENDING  Incomplete  Blood Culture (routine x 2)     Status: None (Preliminary result)   Collection  Time: 11/19/19  4:40 PM   Specimen: BLOOD  Result Value Ref Range Status   Specimen Description BLOOD LEFT ANTECUBITAL  Final   Special Requests   Final    BOTTLES DRAWN AEROBIC AND ANAEROBIC Blood Culture adequate volume   Culture   Final    NO GROWTH 4 DAYS Performed at [facility], [address] [place], [place] [address]    Report Status PENDING  Incomplete     Time spent in discharge (includes decision making & examination of pt): 35 minutes  11/23/2019, 12:33 PM   Cherene Altes, MD  [facility] Office  [phone]

## 2019-11-23 NOTE — Progress Notes (Signed)
[facility] called for transportation to: [address], [place], [place]  [phone]

## 2019-11-23 NOTE — Discharge Instructions (Signed)
Date of Positive COVID Test: 11/19/2019  Date Quarantine Ends: 12/10/2019    COVID-19 COVID-19 is a respiratory infection that is caused by a virus called severe acute respiratory syndrome coronavirus 2 (SARS-CoV-2). The disease is also known as coronavirus disease or novel coronavirus. In some people, the virus may not cause any symptoms. In others, it may cause a serious infection. The infection can get worse quickly and can lead to complications, such as:  Pneumonia, or infection of the lungs.  Acute respiratory distress syndrome or ARDS. This is fluid build-up in the lungs.  Acute respiratory failure. This is a condition in which there is not enough oxygen passing from the lungs to the body.  Sepsis or septic shock. This is a serious bodily reaction to an infection.  Blood clotting problems.  Secondary infections due to bacteria or fungus. The virus that causes COVID-19 is contagious. This means that it can spread from person to person through droplets from coughs and sneezes (respiratory secretions). What are the causes? This illness is caused by a virus. You may catch the virus by:  Breathing in droplets from an infected person's cough or sneeze.  Touching something, like a table or a doorknob, that was exposed to the virus (contaminated) and then touching your mouth, nose, or eyes. What increases the risk? Risk for infection You are more likely to be infected with this virus if you:  Live in or travel to an area with a COVID-19 outbreak.  Come in contact with a sick person who recently traveled to an area with a COVID-19 outbreak.  Provide care for or live with a person who is infected with COVID-19. Risk for serious illness You are more likely to become seriously ill from the virus if you:  Are 58 years of age or older.  Have a long-term disease that lowers your body's ability to fight infection (immunocompromised).  Live in a nursing home or long-term care  facility.  Have a long-term (chronic) disease such as: ? Chronic lung disease, including chronic obstructive pulmonary disease or asthma ? Heart disease. ? Diabetes. ? Chronic kidney disease. ? Liver disease.  Are obese. What are the signs or symptoms? Symptoms of this condition can range from mild to severe. Symptoms may appear any time from 2 to 14 days after being exposed to the virus. They include:  A fever.  A cough.  Difficulty breathing.  Chills.  Muscle pains.  A sore throat.  Loss of taste or smell. Some people may also have stomach problems, such as nausea, vomiting, or diarrhea. Other people may not have any symptoms of COVID-19. How is this diagnosed? This condition may be diagnosed based on:  Your signs and symptoms, especially if: ? You live in an area with a COVID-19 outbreak. ? You recently traveled to or from an area where the virus is common. ? You provide care for or live with a person who was diagnosed with COVID-19.  A physical exam.  Lab tests, which may include: ? A nasal swab to take a sample of fluid from your nose. ? A throat swab to take a sample of fluid from your throat. ? A sample of mucus from your lungs (sputum). ? Blood tests.  Imaging tests, which may include, X-rays, CT scan, or ultrasound. How is this treated? At present, there is no medicine to treat COVID-19. Medicines that treat other diseases are being used on a trial basis to see if they are effective against COVID-19. Your health  care provider will talk with you about ways to treat your symptoms. For most people, the infection is mild and can be managed at home with rest, fluids, and over-the-counter medicines. Treatment for a serious infection usually takes places in a hospital intensive care unit (ICU). It may include one or more of the following treatments. These treatments are given until your symptoms improve.  Receiving fluids and medicines through an  IV.  Supplemental oxygen. Extra oxygen is given through a tube in the nose, a face mask, or a hood.  Positioning you to lie on your stomach (prone position). This makes it easier for oxygen to get into the lungs.  Continuous positive airway pressure (CPAP) or bi-level positive airway pressure (BPAP) machine. This treatment uses mild air pressure to keep the airways open. A tube that is connected to a motor delivers oxygen to the body.  Ventilator. This treatment moves air into and out of the lungs by using a tube that is placed in your windpipe.  Tracheostomy. This is a procedure to create a hole in the neck so that a breathing tube can be inserted.  Extracorporeal membrane oxygenation (ECMO). This procedure gives the lungs a chance to recover by taking over the functions of the heart and lungs. It supplies oxygen to the body and removes carbon dioxide. Follow these instructions at home: Lifestyle  If you are sick, stay home except to get medical care. Your health care provider will tell you how long to stay home. Call your health care provider before you go for medical care.  Rest at home as told by your health care provider.  Do not use any products that contain nicotine or tobacco, such as cigarettes, e-cigarettes, and chewing tobacco. If you need help quitting, ask your health care provider.  Return to your normal activities as told by your health care provider. Ask your health care provider what activities are safe for you. General instructions  Take over-the-counter and prescription medicines only as told by your health care provider.  Drink enough fluid to keep your urine pale yellow.  Keep all follow-up visits as told by your health care provider. This is important. How is this prevented?  There is no vaccine to help prevent COVID-19 infection. However, there are steps you can take to protect yourself and others from this virus. To protect yourself:   Do not travel to areas  where COVID-19 is a risk. The areas where COVID-19 is reported change often. To identify high-risk areas and travel restrictions, check the CDC travel website: FatFares.com.br  If you live in, or must travel to, an area where COVID-19 is a risk, take precautions to avoid infection. ? Stay away from people who are sick. ? Wash your hands often with soap and water for 20 seconds. If soap and water are not available, use an alcohol-based hand sanitizer. ? Avoid touching your mouth, face, eyes, or nose. ? Avoid going out in public, follow guidance from your state and local health authorities. ? If you must go out in public, wear a cloth face covering or face mask. ? Disinfect objects and surfaces that are frequently touched every day. This may include:  Counters and tables.  Doorknobs and light switches.  Sinks and faucets.  Electronics, such as phones, remote controls, keyboards, computers, and tablets. To protect others: If you have symptoms of COVID-19, take steps to prevent the virus from spreading to others.  If you think you have a COVID-19 infection, contact your health  care provider right away. Tell your health care team that you think you may have a COVID-19 infection.  Stay home. Leave your house only to seek medical care. Do not use public transport.  Do not travel while you are sick.  Wash your hands often with soap and water for 20 seconds. If soap and water are not available, use alcohol-based hand sanitizer.  Stay away from other members of your household. Let healthy household members care for children and pets, if possible. If you have to care for children or pets, wash your hands often and wear a mask. If possible, stay in your own room, separate from others. Use a different bathroom.  Make sure that all people in your household wash their hands well and often.  Cough or sneeze into a tissue or your sleeve or elbow. Do not cough or sneeze into your hand or  into the air.  Wear a cloth face covering or face mask. Where to find more information  Centers for Disease Control and Prevention: PurpleGadgets.be  World Health Organization: https://www.castaneda.info/ Contact a health care provider if:  You live in or have traveled to an area where COVID-19 is a risk and you have symptoms of the infection.  You have had contact with someone who has COVID-19 and you have symptoms of the infection. Get help right away if:  You have trouble breathing.  You have pain or pressure in your chest.  You have confusion.  You have bluish lips and fingernails.  You have difficulty waking from sleep.  You have symptoms that get worse. These symptoms may represent a serious problem that is an emergency. Do not wait to see if the symptoms will go away. Get medical help right away. Call your local emergency services (911 in the [place] Do not drive yourself to the hospital. Let the emergency medical personnel know if you think you have COVID-19. Summary  COVID-19 is a respiratory infection that is caused by a virus. It is also known as coronavirus disease or novel coronavirus. It can cause serious infections, such as pneumonia, acute respiratory distress syndrome, acute respiratory failure, or sepsis.  The virus that causes COVID-19 is contagious. This means that it can spread from person to person through droplets from coughs and sneezes.  You are more likely to develop a serious illness if you are 49 years of age or older, have a weak immunity, live in a nursing home, or have chronic disease.  There is no medicine to treat COVID-19. Your health care provider will talk with you about ways to treat your symptoms.  Take steps to protect yourself and others from infection. Wash your hands often and disinfect objects and surfaces that are frequently touched every day. Stay away from people who are sick and wear a mask if you  are sick. This information is not intended to replace advice given to you by your health care provider. Make sure you discuss any questions you have with your health care provider. Document Released: 12/28/2018 Document Revised: 04/19/2019 Document Reviewed: 12/28/2018 Elsevier Patient Education  2020 [employer].   COVID-19 Frequently Asked Questions COVID-19 (coronavirus disease) is an infection that is caused by a large family of viruses. Some viruses cause illness in people and others cause illness in animals like camels, cats, and bats. In some cases, the viruses that cause illness in animals can spread to humans. Where did the coronavirus come from? In December 2019, [place] told the [facility]) of  several cases of lung disease (human respiratory illness). These cases were linked to an open seafood and livestock market in the city of [place]. The link to the seafood and livestock market suggests that the virus may have spread from animals to humans. However, since that first outbreak in December, the virus has also been shown to spread from person to person. What is the name of the disease and the virus? Disease name Early on, this disease was called novel coronavirus. This is because scientists determined that the disease was caused by a new (novel) respiratory virus. The World Health Organization [facility]) has now named the disease COVID-19, or coronavirus disease. Virus name The virus that causes the disease is called severe acute respiratory syndrome coronavirus 2 (SARS-CoV-2). More information on disease and virus naming World Health Organization [facility]): www.who.int/emergencies/diseases/novel-coronavirus-2019/technical-guidance/naming-the-coronavirus-disease-(covid-2019)-and-the-virus-that-causes-it Who is at risk for complications from coronavirus disease? Some people may be at higher risk for complications from coronavirus disease. This includes older adults and people who  have chronic diseases, such as heart disease, diabetes, and lung disease. If you are at higher risk for complications, take these extra precautions:  Avoid close contact with people who are sick or have a fever or cough. Stay at least 3-6 ft (1-2 m) away from them, if possible.  Wash your hands often with soap and water for at least 20 seconds.  Avoid touching your face, mouth, nose, or eyes.  Keep supplies on hand at home, such as food, medicine, and cleaning supplies.  Stay home as much as possible.  Avoid social gatherings and travel. How does coronavirus disease spread? The virus that causes coronavirus disease spreads easily from person to person (is contagious). There are also cases of community-spread disease. This means the disease has spread to:  People who have no known contact with other infected people.  People who have not traveled to areas where there are known cases. It appears to spread from one person to another through droplets from coughing or sneezing. Can I get the virus from touching surfaces or objects? There is still a lot that we do not know about the virus that causes coronavirus disease. Scientists are basing a lot of information on what they know about similar viruses, such as:  Viruses cannot generally survive on surfaces for long. They need a human body (host) to survive.  It is more likely that the virus is spread by close contact with people who are sick (direct contact), such as through: ? Shaking hands or hugging. ? Breathing in respiratory droplets that travel through the air. This can happen when an infected person coughs or sneezes on or near other people.  It is less likely that the virus is spread when a person touches a surface or object that has the virus on it (indirect contact). The virus may be able to enter the body if the person touches a surface or object and then touches his or her face, eyes, nose, or mouth. Can a person spread the virus  without having symptoms of the disease? It may be possible for the virus to spread before a person has symptoms of the disease, but this is most likely not the main way the virus is spreading. It is more likely for the virus to spread by being in close contact with people who are sick and breathing in the respiratory droplets of a sick person's cough or sneeze. What are the symptoms of coronavirus disease? Symptoms vary from person to person and can range  from mild to severe. Symptoms may include:  Fever.  Cough.  Tiredness, weakness, or fatigue.  Fast breathing or feeling short of breath. These symptoms can appear anywhere from 2 to 14 days after you have been exposed to the virus. If you develop symptoms, call your health care provider. People with severe symptoms may need hospital care. If I am exposed to the virus, how long does it take before symptoms start? Symptoms of coronavirus disease may appear anywhere from 2 to 14 days after a person has been exposed to the virus. If you develop symptoms, call your health care provider. Should I be tested for this virus? Your health care provider will decide whether to test you based on your symptoms, history of exposure, and your risk factors. How does a health care provider test for this virus? Health care providers will collect samples to send for testing. Samples may include:  Taking a swab of fluid from the nose.  Taking fluid from the lungs by having you cough up mucus (sputum) into a sterile cup.  Taking a blood sample.  Taking a stool or urine sample. Is there a treatment or vaccine for this virus? Currently, there is no vaccine to prevent coronavirus disease. Also, there are no medicines like antibiotics or antivirals to treat the virus. A person who becomes sick is given supportive care, which means rest and fluids. A person may also relieve his or her symptoms by using over-the-counter medicines that treat sneezing, coughing, and  runny nose. These are the same medicines that a person takes for the common cold. If you develop symptoms, call your health care provider. People with severe symptoms may need hospital care. What can I do to protect myself and my family from this virus?     You can protect yourself and your family by taking the same actions that you would take to prevent the spread of other viruses. Take the following actions:  Wash your hands often with soap and water for at least 20 seconds. If soap and water are not available, use alcohol-based hand sanitizer.  Avoid touching your face, mouth, nose, or eyes.  Cough or sneeze into a tissue, sleeve, or elbow. Do not cough or sneeze into your hand or the air. ? If you cough or sneeze into a tissue, throw it away immediately and wash your hands.  Disinfect objects and surfaces that you frequently touch every day.  Avoid close contact with people who are sick or have a fever or cough. Stay at least 3-6 ft (1-2 m) away from them, if possible.  Stay home if you are sick, except to get medical care. Call your health care provider before you get medical care.  Make sure your vaccines are up to date. Ask your health care provider what vaccines you need. What should I do if I need to travel? Follow travel recommendations from your local health authority, the CDC, and WHO. Travel information and advice  Centers for Disease Control and Prevention (CDC): BodyEditor.hu  World Health Organization [facility]): ThirdIncome.ca Know the risks and take action to protect your health  You are at higher risk of getting coronavirus disease if you are traveling to areas with an outbreak or if you are exposed to travelers from areas with an outbreak.  Wash your hands often and practice good hygiene to lower the risk of catching or spreading the virus. What should I do if I am  sick? General instructions to stop the spread of infection  Wash your hands often with soap and water for at least 20 seconds. If soap and water are not available, use alcohol-based hand sanitizer.  Cough or sneeze into a tissue, sleeve, or elbow. Do not cough or sneeze into your hand or the air.  If you cough or sneeze into a tissue, throw it away immediately and wash your hands.  Stay home unless you must get medical care. Call your health care provider or local health authority before you get medical care.  Avoid public areas. Do not take public transportation, if possible.  If you can, wear a mask if you must go out of the house or if you are in close contact with someone who is not sick. Keep your home clean  Disinfect objects and surfaces that are frequently touched every day. This may include: ? Counters and tables. ? Doorknobs and light switches. ? Sinks and faucets. ? Electronics such as phones, remote controls, keyboards, computers, and tablets.  Wash dishes in hot, soapy water or use a dishwasher. Air-dry your dishes.  Wash laundry in hot water. Prevent infecting other household members  Let healthy household members care for children and pets, if possible. If you have to care for children or pets, wash your hands often and wear a mask.  Sleep in a different bedroom or bed, if possible.  Do not share personal items, such as razors, toothbrushes, deodorant, combs, brushes, towels, and washcloths. Where to find more information Centers for Disease Control and Prevention (CDC)  Information and news updates: https://www.butler-gonzalez.com/ World Health Organization [facility])  Information and news updates: MissExecutive.com.ee  Coronavirus health topic: https://www.castaneda.info/  Questions and answers on COVID-19: OpportunityDebt.at  Global tracker: who.sprinklr.com American Academy of  Pediatrics (AAP)  Information for families: www.healthychildren.org/English/health-issues/conditions/chest-lungs/Pages/2019-Novel-Coronavirus.aspx The coronavirus situation is changing rapidly. Check your local health authority website or the CDC and [facility] websites for updates and news. When should I contact a health care provider?  Contact your health care provider if you have symptoms of an infection, such as fever or cough, and you: ? Have been near anyone who is known to have coronavirus disease. ? Have come into contact with a person who is suspected to have coronavirus disease. ? Have traveled outside of the country. When should I get emergency medical care?  Get help right away by calling your local emergency services (911 in the [place] if you have: ? Trouble breathing. ? Pain or pressure in your chest. ? Confusion. ? Blue-tinged lips and fingernails. ? Difficulty waking from sleep. ? Symptoms that get worse. Let the emergency medical personnel know if you think you have coronavirus disease. Summary  A new respiratory virus is spreading from person to person and causing COVID-19 (coronavirus disease).  The virus that causes COVID-19 appears to spread easily. It spreads from one person to another through droplets from coughing or sneezing.  Older adults and those with chronic diseases are at higher risk of disease. If you are at higher risk for complications, take extra precautions.  There is currently no vaccine to prevent coronavirus disease. There are no medicines, such as antibiotics or antivirals, to treat the virus.  You can protect yourself and your family by washing your hands often, avoiding touching your face, and covering your coughs and sneezes. This information is not intended to replace advice given to you by your health care provider. Make sure you discuss any questions you have with your health care provider. Document Released: 03/20/2019 Document Revised:  03/20/2019 Document Reviewed: 03/20/2019  Elsevier Patient Education  2020 [place]: How to Protect Yourself and Others Know how it spreads  There is currently no vaccine to prevent coronavirus disease 2019 (COVID-19).  The best way to prevent illness is to avoid being exposed to this virus.  The virus is thought to spread mainly from person-to-person. ? Between people who are in close contact with one another (within about 6 feet). ? Through respiratory droplets produced when an infected person coughs, sneezes or talks. ? These droplets can land in the mouths or noses of people who are nearby or possibly be inhaled into the lungs. ? Some recent studies have suggested that COVID-19 may be spread by people who are not showing symptoms. Everyone should Clean your hands often  Wash your hands often with soap and water for at least 20 seconds especially after you have been in a public place, or after blowing your nose, coughing, or sneezing.  If soap and water are not readily available, use a hand sanitizer that contains at least 60% alcohol. Cover all surfaces of your hands and rub them together until they feel dry.  Avoid touching your eyes, nose, and mouth with unwashed hands. Avoid close contact  Stay home if you are sick.  Avoid close contact with people who are sick.  Put distance between yourself and other people. ? Remember that some people without symptoms may be able to spread virus. ? This is especially important for people who are at higher risk of getting very GainPain.com.cy Cover your mouth and nose with a cloth face cover when around others  You could spread COVID-19 to others even if you do not feel sick.  Everyone should wear a cloth face cover when they have to go out in public, for example to the grocery store or to pick up other necessities. ? Cloth face coverings should not  be placed on young children under age 68, anyone who has trouble breathing, or is unconscious, incapacitated or otherwise unable to remove the mask without assistance.  The cloth face cover is meant to protect other people in case you are infected.  Do NOT use a facemask meant for a Dietitian.  Continue to keep about 6 feet between yourself and others. The cloth face cover is not a substitute for social distancing. Cover coughs and sneezes  If you are in a private setting and do not have on your cloth face covering, remember to always cover your mouth and nose with a tissue when you cough or sneeze or use the inside of your elbow.  Throw used tissues in the trash.  Immediately wash your hands with soap and water for at least 20 seconds. If soap and water are not readily available, clean your hands with a hand sanitizer that contains at least 60% alcohol. Clean and disinfect  Clean AND disinfect frequently touched surfaces daily. This includes tables, doorknobs, light switches, countertops, handles, desks, phones, keyboards, toilets, faucets, and sinks. RackRewards.fr  If surfaces are dirty, clean them: Use detergent or soap and water prior to disinfection.  Then, use a household disinfectant. You can see a list of EPA-registered household disinfectants here. [email or web address] 04/10/2019 This information is not intended to replace advice given to you by your health care provider. Make sure you discuss any questions you have with your health care provider. Document Released: 03/20/2019 Document Revised: 04/18/2019 Document Reviewed: 03/20/2019 Elsevier Patient Education  [place].

## 2019-11-23 NOTE — Progress Notes (Signed)
Pt given all belongings. Discharge instructions reviewed with pt. Pt verbalized understanding. IV removed with no complications. Pt will pick up prescription from local pharmacy.

## 2019-11-23 NOTE — Plan of Care (Signed)

## 2019-11-24 LAB — CULTURE, BLOOD (ROUTINE X 2)
Culture: NO GROWTH
Culture: NO GROWTH
Special Requests: ADEQUATE
Special Requests: ADEQUATE

## 2019-11-26 ENCOUNTER — Telehealth: Payer: Self-pay | Admitting: Family Medicine

## 2019-11-26 NOTE — Telephone Encounter (Signed)
Hospital follow up appointment scheduled with Dr. Warrick Parisian on 12/13/19 at 8:10 am for telephone visit.  Patient aware.

## 2019-12-13 ENCOUNTER — Other Ambulatory Visit: Payer: Self-pay

## 2019-12-13 ENCOUNTER — Ambulatory Visit: Payer: BC Managed Care – PPO | Admitting: Family Medicine

## 2019-12-13 ENCOUNTER — Encounter: Payer: Self-pay | Admitting: Family Medicine

## 2019-12-13 VITALS — BP 125/81 | HR 93 | Temp 99.5°F | Ht 66.0 in | Wt 193.8 lb

## 2019-12-13 DIAGNOSIS — U071 COVID-19: Secondary | ICD-10-CM

## 2019-12-13 NOTE — Progress Notes (Signed)
BP 125/81   Pulse 93   Temp 99.5 F (37.5 C) (Temporal)   Ht 5\' 6"  (1.676 m)   Wt 193 lb 12.8 oz (87.9 kg)   SpO2 96%   BMI 31.28 kg/m    Subjective:   Patient ID: [name], male    DOB: Jul 31, 1961, 59 y.o.   MRN: [id number]  HPI: [name] is a 59 y.o. male presenting on 12/13/2019 for Hospitalization Follow-up (COVID- needs note to go back to work )   HPI Patient is coming in today for hospital follow-up.  He was diagnosed with Covid pneumonia on 11/19/2019 and was treated and it has now been 24 days and he says his energy is still coming back slowly but his breathing is fine and denies any chest pain or swelling or any other issues.  He says he feels back to normal except his energy is still just slightly down.  He is coming back today to get a note to go back to work after the [employer].  He says his wife and his brother were both infected as well and they are both doing good as well.  Relevant past medical, surgical, family and social history reviewed and updated as indicated. Interim medical history since our last visit reviewed. Allergies and medications reviewed and updated.  Review of Systems  Constitutional: Positive for fatigue. Negative for chills and fever.  HENT: Negative for congestion, sinus pressure, sinus pain, sneezing and sore throat.   Eyes: Negative for visual disturbance.  Respiratory: Negative for cough, shortness of breath and wheezing.   Cardiovascular: Negative for chest pain, palpitations and leg swelling.  Musculoskeletal: Negative for back pain and gait problem.  Skin: Negative for rash.  Neurological: Negative for dizziness, weakness and light-headedness.  All other systems reviewed and are negative.   Per HPI unless specifically indicated above   Allergies as of 12/13/2019      Reactions   Morphine And Related Swelling   Bee Venom Rash      Medication List       Accurate as of December 13, 2019  8:31 AM. If you have any questions,  ask your nurse or doctor.        STOP taking these medications   benzonatate 200 MG capsule Commonly known as: TESSALON Stopped by: Worthy Rancher, MD     TAKE these medications   acetaminophen 325 MG tablet Commonly known as: TYLENOL Take 2 tablets (650 mg total) by mouth every 6 (six) hours as needed for mild pain, moderate pain, fever or headache.   pantoprazole 40 MG tablet Commonly known as: PROTONIX TAKE ONE (1) TABLET EACH DAY What changed: See the new instructions.   ProAir HFA 108 (90 Base) MCG/ACT inhaler Generic drug: albuterol USE 2 PUFFS EVERY 6 HOURS AS NEEDED What changed: See the new instructions.        Objective:   BP 125/81   Pulse 93   Temp 99.5 F (37.5 C) (Temporal)   Ht 5\' 6"  (1.676 m)   Wt 193 lb 12.8 oz (87.9 kg)   SpO2 96%   BMI 31.28 kg/m   Wt Readings from Last 3 Encounters:  12/13/19 193 lb 12.8 oz (87.9 kg)  11/19/19 190 lb 0.6 oz (86.2 kg)  12/03/18 189 lb 15.9 oz (86.2 kg)    Physical Exam Vitals and nursing note reviewed.  Constitutional:      General: He is not in acute distress.    Appearance: He  is well-developed. He is not diaphoretic.  Eyes:     General: No scleral icterus.    Conjunctiva/sclera: Conjunctivae normal.  Neck:     Thyroid: No thyromegaly.  Cardiovascular:     Rate and Rhythm: Normal rate and regular rhythm.     Heart sounds: Normal heart sounds. No murmur.  Pulmonary:     Effort: Pulmonary effort is normal. No respiratory distress.     Breath sounds: Normal breath sounds. No wheezing.  Abdominal:     General: Abdomen is flat. Bowel sounds are normal. There is no distension.     Tenderness: There is no abdominal tenderness. There is no right CVA tenderness, left CVA tenderness, guarding or rebound.     Hernia: No hernia is present.  Musculoskeletal:        General: No swelling. Normal range of motion.     Cervical back: Neck supple.  Lymphadenopathy:     Cervical: No cervical adenopathy.    Skin:    General: Skin is warm and dry.     Findings: No rash.  Neurological:     Mental Status: He is alert and oriented to person, place, and time.     Coordination: Coordination normal.  Psychiatric:        Behavior: Behavior normal.      Assessment & Plan:   Problem List Items Addressed This Visit      Other   COVID-19 virus infection - Primary    Patient doing very well and besides a little bit of fatigue he is asymptomatic and we will clear him to go back to work.  Follow up plan: Return in about 2 months (around 02/10/2020), or if symptoms worsen or fail to improve, for Well adult exam.  Counseling provided for all of the vaccine components No orders of the defined types were placed in this encounter.   Caryl Pina, MD [facility] 12/13/2019, 8:31 AM

## 2019-12-24 ENCOUNTER — Other Ambulatory Visit: Payer: Self-pay

## 2020-01-09 ENCOUNTER — Telehealth: Payer: Self-pay | Admitting: Radiology

## 2020-01-09 NOTE — Telephone Encounter (Signed)
I called patient, LM to call me.  Patient had called earlier requested appt with Dr Aline Brochure or Dr Luna Glasgow about his right hand.  He was dx with Dupuytren's Contracture by Dr Erlinda Hong and referred to Dr Burney Gauze for this in 2017.  Need to discuss with patient when he calls.

## 2020-01-09 NOTE — Telephone Encounter (Signed)
Patient called back, appt made

## 2020-01-16 ENCOUNTER — Ambulatory Visit: Payer: BC Managed Care – PPO | Admitting: Orthopaedic Surgery

## 2020-01-17 ENCOUNTER — Ambulatory Visit: Payer: BC Managed Care – PPO | Admitting: Orthopaedic Surgery

## 2020-01-17 ENCOUNTER — Other Ambulatory Visit: Payer: Self-pay

## 2020-01-17 ENCOUNTER — Encounter: Payer: Self-pay | Admitting: Orthopaedic Surgery

## 2020-01-17 VITALS — Temp 98.2°F | Ht 65.0 in | Wt 194.4 lb

## 2020-01-17 DIAGNOSIS — M654 Radial styloid tenosynovitis [de Quervain]: Secondary | ICD-10-CM | POA: Diagnosis not present

## 2020-01-17 NOTE — Progress Notes (Signed)
Subjective:    Patient ID: [name], male    DOB: [birth year], 59 y.o.   MRN: [id number]  HPI He has had pain in movement of the right thumb more in the first compartment area for about two months getting worse.  He has had slight swelling but no redness.  He has had surgery on the right palm for Duptuytren's contracture in the past.  He has had surgery on the right volar forearm in the past also.  He has no numbness.  He has no trauma.  It hurts to pick up an object where he has to flex his thumb.  I have reviewed notes from [employer].  Review of Systems  Constitutional: Positive for activity change.  Musculoskeletal: Positive for arthralgias.  Allergic/Immunologic: Positive for environmental allergies.  All other systems reviewed and are negative.  For Review of Systems, all other systems reviewed and are negative.  The following is a summary of the past history medically, past history surgically, known current medicines, social history and family history.  This information is gathered electronically by the computer from prior information and documentation.  I review this each visit and have found including this information at this point in the chart is beneficial and informative.   Past Medical History:  Diagnosis Date  . Allergy   . Arthritis   . Calcaneus fracture, left   . Cancer [facility])    Skin cancer Left Arm  . GERD (gastroesophageal reflux disease)   . Small bowel obstruction [facility])     Past Surgical History:  Procedure Laterality Date  . FOOT SURGERY    . HAND SURGERY Right    contracture of hand  . INGUINAL HERNIA REPAIR Right 11/23/2013   Procedure: HERNIA REPAIR INGUINAL ADULT;  Surgeon: Scherry Ran, MD;  Location: [facility];  Service: General;  Laterality: Right;  site-inguinal area  . OPEN REDUCTION, INTERNAL FIXATION (ORIF) CALCANEAL FRACTURE WITH FUSION Left 11/30/2018   Procedure: OPEN REDUCTION, INTERNAL FIXATION (ORIF) CALCANEAL FRACTURE  WITH PERONEAL DEBRIDEMENT & REPAIR OF SUPERIOR PERONEAL RETINACULUM;  Surgeon: Erle Crocker, MD;  Location: [facility];  Service: Orthopedics;  Laterality: Left;  Marland Kitchen VASECTOMY    . WRIST SURGERY Right    otif    Current Outpatient Medications on File Prior to Visit  Medication Sig Dispense Refill  . acetaminophen (TYLENOL) 325 MG tablet Take 2 tablets (650 mg total) by mouth every 6 (six) hours as needed for mild pain, moderate pain, fever or headache.    . pantoprazole (PROTONIX) 40 MG tablet TAKE ONE (1) TABLET EACH DAY (Patient taking differently: Take 40 mg by mouth every morning. ) 90 tablet 3  . PROAIR HFA 108 (90 Base) MCG/ACT inhaler USE 2 PUFFS EVERY 6 HOURS AS NEEDED (Patient taking differently: Inhale 2 puffs into the lungs every 6 (six) hours as needed for wheezing or shortness of breath. ) 8.5 g 3   No current facility-administered medications on file prior to visit.    Social History   Socioeconomic History  . Marital status: Married    Spouse name: Not on file  . Number of children: Not on file  . Years of education: Not on file  . Highest education level: Not on file  Occupational History  . Not on file  Tobacco Use  . Smoking status: Never Smoker  . Smokeless tobacco: Never Used  Substance and Sexual Activity  . Alcohol use: Yes    Alcohol/week: 4.0 standard drinks  Types: 4 Cans of beer per week    Comment: occ  . Drug use: No  . Sexual activity: Yes    Birth control/protection: None    Comment: married 2005  Other Topics Concern  . Not on file  Social History Narrative  . Not on file   Social Determinants of Health   Financial Resource Strain:   . Difficulty of Paying Living Expenses: Not on file  Food Insecurity:   . Worried About Charity fundraiser in the Last Year: Not on file  . Ran Out of Food in the Last Year: Not on file  Transportation Needs:   . Lack of Transportation (Medical): Not on file  . Lack of Transportation (Non-Medical):  Not on file  Physical Activity:   . Days of Exercise per Week: Not on file  . Minutes of Exercise per Session: Not on file  Stress:   . Feeling of Stress : Not on file  Social Connections:   . Frequency of Communication with Friends and Family: Not on file  . Frequency of Social Gatherings with Friends and Family: Not on file  . Attends Religious Services: Not on file  . Active Member of Clubs or Organizations: Not on file  . Attends Archivist Meetings: Not on file  . Marital Status: Not on file  Intimate Partner Violence:   . Fear of Current or Ex-Partner: Not on file  . Emotionally Abused: Not on file  . Physically Abused: Not on file  . Sexually Abused: Not on file    Family History  Problem Relation Age of Onset  . Stroke Mother   . Cancer Mother        colon  . Alzheimer's disease Father        31 years  . Heart disease Father        atrial fibri  . Heart disease Sister   . Early death Brother   . Cancer Maternal Grandmother     Temp 98.2 F (36.8 C)   Ht 5\' 5"  (1.651 m)   Wt 194 lb 6 oz (88.2 kg)   BMI 32.35 kg/m   Body mass index is 32.35 kg/m.     Objective:   Physical Exam Vitals and nursing note reviewed.  Constitutional:      Appearance: He is well-developed.  HENT:     Head: Normocephalic and atraumatic.  Eyes:     Conjunctiva/sclera: Conjunctivae normal.     Pupils: Pupils are equal, round, and reactive to light.  Cardiovascular:     Rate and Rhythm: Normal rate and regular rhythm.  Pulmonary:     Effort: Pulmonary effort is normal.  Abdominal:     Palpations: Abdomen is soft.  Musculoskeletal:       Hands:     Cervical back: Normal range of motion and neck supple.  Skin:    General: Skin is warm and dry.  Neurological:     Mental Status: He is alert and oriented to person, place, and time.     Cranial Nerves: No cranial nerve deficit.     Motor: No abnormal muscle tone.     Coordination: Coordination normal.     Deep  Tendon Reflexes: Reflexes are normal and symmetric. Reflexes normal.  Psychiatric:        Behavior: Behavior normal.        Thought Content: Thought content normal.        Judgment: Judgment normal.  Assessment & Plan:   Encounter Diagnosis  Name Primary?  . Radial styloid tenosynovitis (de quervain) Yes   Procedure note: After permission from the patient and sterile prep of the first extensor compartment area of the right thumb/wrist, I injected 1 % plain Xylocaine and 1 cc DepoMedrol 40 into the first extensor compartment area by sterile technique tolerated well.  Use Aspercreme or BioFreeze to the area.  Return in two weeks.  Call if any problem.  Precautions discussed.   Electronically Signed Sanjuana Kava, MD 2/11/20218:24 AM

## 2020-01-31 ENCOUNTER — Other Ambulatory Visit: Payer: Self-pay

## 2020-01-31 ENCOUNTER — Ambulatory Visit: Payer: BC Managed Care – PPO | Admitting: Orthopaedic Surgery

## 2020-01-31 ENCOUNTER — Encounter: Payer: Self-pay | Admitting: Orthopaedic Surgery

## 2020-01-31 VITALS — Temp 98.8°F | Ht 65.0 in | Wt 189.0 lb

## 2020-01-31 DIAGNOSIS — M654 Radial styloid tenosynovitis [de Quervain]: Secondary | ICD-10-CM | POA: Diagnosis not present

## 2020-01-31 NOTE — Progress Notes (Signed)
His De Quervain's on the right is still tender.  Procedure note: After permission from the patient and sterile prep, the first extensor compartment on the right was injected with 1 % plain Xylocaine and 1 cc DepoMedrol 40 by sterile technique, tolerated well.  Return in three week.  Splint given.  Encounter Diagnosis  Name Primary?  . Radial styloid tenosynovitis (de quervain) Yes   Call if any problem.  Precautions discussed.  Electronically Signed Sanjuana Kava, MD 2/25/20219:54 AM

## 2020-02-05 ENCOUNTER — Ambulatory Visit: Payer: BC Managed Care – PPO | Admitting: Orthopaedic Surgery

## 2020-02-14 ENCOUNTER — Other Ambulatory Visit: Payer: Self-pay | Admitting: Family Medicine

## 2020-02-14 DIAGNOSIS — K219 Gastro-esophageal reflux disease without esophagitis: Secondary | ICD-10-CM

## 2020-02-28 ENCOUNTER — Ambulatory Visit: Payer: BC Managed Care – PPO | Admitting: Orthopaedic Surgery

## 2020-03-27 ENCOUNTER — Ambulatory Visit (INDEPENDENT_AMBULATORY_CARE_PROVIDER_SITE_OTHER): Payer: BC Managed Care – PPO | Admitting: Family Medicine

## 2020-03-27 ENCOUNTER — Other Ambulatory Visit: Payer: Self-pay

## 2020-03-27 ENCOUNTER — Encounter: Payer: Self-pay | Admitting: Family Medicine

## 2020-03-27 VITALS — BP 133/77 | HR 99 | Temp 98.2°F | Ht 65.0 in | Wt 191.5 lb

## 2020-03-27 DIAGNOSIS — Z0000 Encounter for general adult medical examination without abnormal findings: Secondary | ICD-10-CM

## 2020-03-27 DIAGNOSIS — K219 Gastro-esophageal reflux disease without esophagitis: Secondary | ICD-10-CM | POA: Diagnosis not present

## 2020-03-27 DIAGNOSIS — Z0001 Encounter for general adult medical examination with abnormal findings: Secondary | ICD-10-CM

## 2020-03-27 DIAGNOSIS — Z1159 Encounter for screening for other viral diseases: Secondary | ICD-10-CM

## 2020-03-27 DIAGNOSIS — Z136 Encounter for screening for cardiovascular disorders: Secondary | ICD-10-CM | POA: Diagnosis not present

## 2020-03-27 MED ORDER — PANTOPRAZOLE SODIUM 40 MG PO TBEC: 40.0000 mg | DELAYED_RELEASE_TABLET | Freq: Every day | ORAL | 3 refills | Status: DC

## 2020-03-27 NOTE — Progress Notes (Signed)
SUBJECTIVE:  [name] is a 59 y.o. male presenting for his annual checkup.  He has no current health concerns except that he needs a refill of his Protonix for his acid reflux. 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 He does not smoke and has occasional alcohol use.  GERD Patient is currently on Protonix.  She denies any major symptoms or abdominal pain or belching or burping. She denies any blood in her stool or lightheadedness or dizziness.   Last colonoscopy was approximately 10 years ago and was done within the [place]. The colonoscopy was normal (no polyps).   Current Outpatient Medications  Medication Sig Dispense Refill  . acetaminophen (TYLENOL) 325 MG tablet Take 2 tablets (650 mg total) by mouth every 6 (six) hours as needed for mild pain, moderate pain, fever or headache.    . Naproxen Sod-diphenhydrAMINE (ALEVE PM PO) Take by mouth.    . pantoprazole (PROTONIX) 40 MG tablet TAKE ONE (1) TABLET EACH DAY.  Needs to be seen for future refills. 30 tablet 0  . PROAIR HFA 108 (90 Base) MCG/ACT inhaler USE 2 PUFFS EVERY 6 HOURS AS NEEDED (Patient taking differently: Inhale 2 puffs into the lungs every 6 (six) hours as needed for wheezing or shortness of breath. ) 8.5 g 3   No current facility-administered medications for this visit.   Allergies: Morphine and related and Bee venom   OBJECTIVE:  The patient appears well, alert, oriented x 3, in no distress.  BP 133/77   Pulse 99   Temp 98.2 F (36.8 C) (Temporal)   Ht 5\' 5"  (1.651 m)   Wt 86.9 kg   BMI 31.87 kg/m   Physical Exam  Constitutional: He is oriented to person, place, and time and well-developed, well-nourished, and in no distress.  Eyes: Pupils are equal, round, and reactive to light. EOM are normal.  Red Reflex  noted bilaterally.  Neck: No thyromegaly present.  Thyroid palpated with symmetrical movement upon swallowing.  Cardiovascular: Normal rate, regular rhythm, normal heart sounds and intact distal pulses. Exam reveals no gallop and no friction rub.  No murmur heard. Pulmonary/Chest: Effort normal and breath sounds normal.  Abdominal: Soft. Bowel sounds are normal. There is no abdominal tenderness. There is no rebound and no guarding.  Genitourinary:    Genitourinary Comments: Prostate exam deferred today. Will check PSA in bloodwork.   Neurological: He is alert and oriented to person, place, and time. Gait normal. Coordination normal.  Patellar Reflexes in tact.  Skin: Skin is warm and dry.  Psychiatric: Mood, memory, affect and judgment normal.    ASSESSMENT:  Healthy adult male.  PLAN:  Will run labs today (CMP, CBC, PSA, Lipid Panel, Hep C). Recommend going for another routine colonoscopy. Patient prefers to wait until the fall when yard-work season is slower. Follow up in 1 year for next annual wellness exam.  Gaynelle Arabian, PA-S2  Will run screening labs today, and given refill for GERD, patient deferred prostate exam and will follow up later.  He Patient seen and examined with Gaynelle Arabian, PA student.  Agree with assessment and plan above. Caryl Pina, MD [facility] 04/02/2020, 10:06 PM

## 2020-03-28 ENCOUNTER — Telehealth: Payer: Self-pay | Admitting: Family Medicine

## 2020-03-28 LAB — CBC WITH DIFFERENTIAL/PLATELET
Basophils Absolute: 0 10*3/uL (ref 0.0–0.2)
Basos: 1 %
EOS (ABSOLUTE): 0 10*3/uL (ref 0.0–0.4)
Eos: 1 %
Hematocrit: 49.9 % (ref 37.5–51.0)
Hemoglobin: 17.5 g/dL (ref 13.0–17.7)
Immature Grans (Abs): 0 10*3/uL (ref 0.0–0.1)
Immature Granulocytes: 0 %
Lymphocytes Absolute: 1.9 10*3/uL (ref 0.7–3.1)
Lymphs: 22 %
MCH: 33.7 pg — ABNORMAL HIGH (ref 26.6–33.0)
MCHC: 35.1 g/dL (ref 31.5–35.7)
MCV: 96 fL (ref 79–97)
Monocytes Absolute: 0.7 10*3/uL (ref 0.1–0.9)
Monocytes: 8 %
Neutrophils Absolute: 6 10*3/uL (ref 1.4–7.0)
Neutrophils: 68 %
Platelets: 289 10*3/uL (ref 150–450)
RBC: 5.19 x10E6/uL (ref 4.14–5.80)
RDW: 12.3 % (ref 11.6–15.4)
WBC: 8.7 10*3/uL (ref 3.4–10.8)

## 2020-03-28 LAB — CMP14+EGFR
ALT: 26 IU/L (ref 0–44)
AST: 22 IU/L (ref 0–40)
Albumin/Globulin Ratio: 1.8 (ref 1.2–2.2)
Albumin: 4.6 g/dL (ref 3.8–4.9)
Alkaline Phosphatase: 97 IU/L (ref 39–117)
BUN/Creatinine Ratio: 13 (ref 9–20)
BUN: 12 mg/dL (ref 6–24)
Bilirubin Total: 0.4 mg/dL (ref 0.0–1.2)
CO2: 26 mmol/L (ref 20–29)
Calcium: 9.7 mg/dL (ref 8.7–10.2)
Chloride: 101 mmol/L (ref 96–106)
Creatinine, Ser: 0.89 mg/dL (ref 0.76–1.27)
GFR calc Af Amer: 110 mL/min/{1.73_m2} (ref >59)
GFR calc non Af Amer: 95 mL/min/{1.73_m2} (ref >59)
Globulin, Total: 2.5 g/dL (ref 1.5–4.5)
Glucose: 105 mg/dL — ABNORMAL HIGH (ref 65–99)
Potassium: 4.5 mmol/L (ref 3.5–5.2)
Sodium: 139 mmol/L (ref 134–144)
Total Protein: 7.1 g/dL (ref 6.0–8.5)

## 2020-03-28 LAB — HEPATITIS C ANTIBODY: Hep C Virus Ab: <0.1 s/co ratio (ref 0.0–0.9)

## 2020-03-28 LAB — LIPID PANEL
Chol/HDL Ratio: 6 ratio — ABNORMAL HIGH (ref 0.0–5.0)
Cholesterol, Total: 222 mg/dL — ABNORMAL HIGH (ref 100–199)
HDL: 37 mg/dL — ABNORMAL LOW (ref >39)
LDL Chol Calc (NIH): 139 mg/dL — ABNORMAL HIGH (ref 0–99)
Triglycerides: 254 mg/dL — ABNORMAL HIGH (ref 0–149)
VLDL Cholesterol Cal: 46 mg/dL — ABNORMAL HIGH (ref 5–40)

## 2020-03-28 LAB — PSA, TOTAL AND FREE
PSA, Free Pct: 38 %
PSA, Free: 0.19 ng/mL
Prostate Specific Ag, Serum: 0.5 ng/mL (ref 0.0–4.0)

## 2020-03-28 NOTE — Telephone Encounter (Signed)
I have spoken with patient about labs. He was not fasting prior to labs. Instructed that we would contact him after you reviewed the results.

## 2020-03-31 MED ORDER — PRAVASTATIN SODIUM 10 MG PO TABS: 10.0000 mg | ORAL_TABLET | Freq: Every evening | ORAL | 3 refills | Status: DC

## 2020-04-01 NOTE — Telephone Encounter (Signed)
Patient's labs look good except his cholesterol which was elevated, specifically triglycerides can be affected more by not fasting, this is something we will have to keep an eye on in the future but for now focus on diet and lifestyle modification.

## 2020-04-01 NOTE — Telephone Encounter (Signed)
Aware of results. 

## 2020-07-23 DIAGNOSIS — R0602 Shortness of breath: Secondary | ICD-10-CM | POA: Diagnosis not present

## 2020-07-23 DIAGNOSIS — J029 Acute pharyngitis, unspecified: Secondary | ICD-10-CM | POA: Diagnosis not present

## 2020-07-23 DIAGNOSIS — R05 Cough: Secondary | ICD-10-CM | POA: Diagnosis not present

## 2020-07-23 DIAGNOSIS — Z6831 Body mass index (BMI) 31.0-31.9, adult: Secondary | ICD-10-CM | POA: Diagnosis not present

## 2020-07-27 ENCOUNTER — Emergency Department (HOSPITAL_COMMUNITY): Payer: BC Managed Care – PPO

## 2020-07-27 ENCOUNTER — Other Ambulatory Visit: Payer: Self-pay

## 2020-07-27 ENCOUNTER — Encounter (HOSPITAL_COMMUNITY): Payer: Self-pay

## 2020-07-27 ENCOUNTER — Emergency Department (HOSPITAL_COMMUNITY)
Admission: EM | Admit: 2020-07-27 | Discharge: 2020-07-27 | Disposition: A | Discharge status: Home / Self Care | Payer: BC Managed Care – PPO | Attending: Emergency Medicine | Admitting: Emergency Medicine

## 2020-07-27 DIAGNOSIS — R05 Cough: Secondary | ICD-10-CM | POA: Diagnosis not present

## 2020-07-27 DIAGNOSIS — Z79899 Other long term (current) drug therapy: Secondary | ICD-10-CM | POA: Diagnosis not present

## 2020-07-27 DIAGNOSIS — J45901 Unspecified asthma with (acute) exacerbation: Secondary | ICD-10-CM | POA: Insufficient documentation

## 2020-07-27 DIAGNOSIS — R0602 Shortness of breath: Secondary | ICD-10-CM | POA: Diagnosis not present

## 2020-07-27 MED ORDER — AEROCHAMBER PLUS FLO-VU MEDIUM MISC
1.0000 | Freq: Once | Status: AC
  Administered 2020-07-27: 1
  Filled 2020-07-27 (×2): qty 1

## 2020-07-27 MED ORDER — ALBUTEROL SULFATE HFA 108 (90 BASE) MCG/ACT IN AERS
2.0000 | INHALATION_SPRAY | Freq: Once | RESPIRATORY_TRACT | Status: AC
  Administered 2020-07-27: 2 via RESPIRATORY_TRACT
  Filled 2020-07-27: qty 6.7

## 2020-07-27 MED ORDER — BENZONATATE 100 MG PO CAPS
200.0000 mg | ORAL_CAPSULE | Freq: Once | ORAL | Status: AC
  Administered 2020-07-27: 200 mg via ORAL
  Filled 2020-07-27: qty 2

## 2020-07-27 MED ORDER — BENZONATATE 100 MG PO CAPS: 200.0000 mg | ORAL_CAPSULE | Freq: Three times a day (TID) | ORAL | 0 refills | Status: DC | PRN

## 2020-07-27 NOTE — ED Provider Notes (Signed)
[facility] EMERGENCY DEPARTMENT Provider Note   CSN: [id number] Arrival date & time: 07/27/20  4315     History Chief Complaint  Patient presents with   Cough    [name] is a 59 y.o. male.  HPI   This patient is a 59 year old male, he has no significant prior cardiac or pulmonary history though stating that he gets bronchitis every summer.  He works in a [employer], he has been coughing for several days which prompted him to visit an urgent care on Wednesday.  He was given prednisone, Augmentin and a cough suppressant liquid but states that none of those medications are helping that much.  He does keep an inhaler at home and states that that does give him some temporary relief.  Last year he was admitted to the hospital because of COVID-19 pneumonia, he was concerned that he may have that again and went to the urgent care where he tested negative.  He did not have a chest x-ray at that time.  He continues to cough, it is a dry cough, it was associated with a sore throat at the beginning which seems to be getting better, there is no gastrointestinal symptoms of nausea vomiting or diarrhea.  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   Numbness and tingling in right hand 10/27/2016   Esophageal reflux 10/29/2014   Primary osteoarthritis of right knee 10/22/2014   Inguinal hernia 11/23/2013    Past Surgical History:  Procedure Laterality Date   FOOT SURGERY     HAND SURGERY Right    contracture of hand   INGUINAL HERNIA REPAIR Right 11/23/2013   Procedure: HERNIA REPAIR INGUINAL ADULT;  Surgeon: Scherry Ran, MD;  Location: [facility];  Service: General;  Laterality: Right;  site-inguinal area     OPEN REDUCTION, INTERNAL FIXATION (ORIF) CALCANEAL FRACTURE WITH FUSION Left 11/30/2018   Procedure: OPEN REDUCTION, INTERNAL FIXATION (ORIF) CALCANEAL FRACTURE WITH PERONEAL DEBRIDEMENT & REPAIR OF SUPERIOR PERONEAL RETINACULUM;  Surgeon: Erle Crocker, MD;  Location: [facility];  Service: Orthopedics;  Laterality: Left;   VASECTOMY     WRIST SURGERY Right    otif       Family History  Problem Relation Age of Onset   Stroke Mother    Cancer Mother        colon   Alzheimer's disease Father        33 years   Heart disease Father        atrial fibri   Heart disease Sister    Early death Brother    Cancer Maternal Grandmother     Social History   Tobacco Use   Smoking status: Never Smoker   Smokeless tobacco: Never Used  Vaping Use   Vaping Use: Never used  Substance Use Topics   Alcohol use: Yes    Alcohol/week: 4.0 standard drinks    Types: 4 Cans of beer per week    Comment: occ   Drug use: No    Home Medications Prior to Admission medications   Medication Sig Start Date End Date Taking? Authorizing Provider  amoxicillin-clavulanate (AUGMENTIN) 875-125 MG tablet Take by mouth.  07/23/20 08/02/20 Yes [provider]  brompheniramine-pseudoephedrine-DM 30-2-10 MG/5ML syrup Take by mouth. 07/23/20 07/28/20 Yes [provider]  predniSONE (DELTASONE) 5 MG tablet Take 6-5-4-3-2-1 po qd 07/23/20  Yes [provider]  acetaminophen (TYLENOL) 325 MG tablet Take 2 tablets (650 mg total) by mouth every 6 (six) hours as needed for mild pain, moderate pain, fever or headache. 11/23/19   Cherene Altes, MD  benzonatate (TESSALON) 100 MG capsule Take 2 capsules (200 mg total) by mouth 3 (three) times daily as needed for cough. 07/27/20   Noemi Chapel, MD  Naproxen Sod-diphenhydrAMINE (ALEVE PM PO) Take by mouth.    [provider]  pantoprazole (PROTONIX) 40 MG tablet Take 1 tablet (40 mg total) by mouth daily. TAKE ONE (1) TABLET  EACH DAY.  Needs to be seen for future refills. 03/27/20   Dettinger, [name], MD  pravastatin (PRAVACHOL) 10 MG tablet Take 1 tablet (10 mg total) by mouth at bedtime. 03/31/20   Dettinger, [name], MD  PROAIR HFA 108 [phone] Base) MCG/ACT inhaler USE 2 PUFFS EVERY 6 HOURS AS NEEDED Patient taking differently: Inhale 2 puffs into the lungs every 6 (six) hours as needed for wheezing or shortness of breath.  05/31/19   Dettinger, [name], MD    Allergies    Morphine and related and Bee venom  Review of Systems   Review of Systems  All other systems reviewed and are negative.   Physical Exam Updated Vital Signs BP 121/80    Pulse 83    Temp 98.6 F (37 C)    Resp 20    SpO2 96%   Physical Exam Vitals and nursing note reviewed.  Constitutional:      General: He is not in acute distress.    Appearance: He is well-developed.  HENT:     Head: Normocephalic and atraumatic.     Mouth/Throat:     Pharynx: No oropharyngeal exudate.  Eyes:     General: No scleral icterus.       Right eye: No discharge.        Left eye: No discharge.     Conjunctiva/sclera: Conjunctivae normal.     Pupils: Pupils are equal, round, and reactive to light.  Neck:     Thyroid: No thyromegaly.     Vascular: No JVD.  Cardiovascular:     Rate and Rhythm: Normal rate and regular rhythm.     Heart sounds: Normal heart sounds. No murmur heard.  No friction rub. No gallop.   Pulmonary:     Effort: Pulmonary effort is normal. No respiratory distress.     Breath sounds: Wheezing present. No rales.  Abdominal:     General: Bowel sounds are normal. There is no distension.     Palpations: Abdomen is soft. There is no mass.     Tenderness: There is no abdominal tenderness.  Musculoskeletal:        General: No tenderness. Normal range of motion.     Cervical back: Normal range of motion and neck supple.  Lymphadenopathy:     Cervical: No cervical adenopathy.  Skin:    General: Skin is warm and dry.     Findings:  No erythema or rash.  Neurological:     Mental Status: He is alert.     Coordination: Coordination normal.  Psychiatric:        Behavior: Behavior normal.     ED Results / Procedures / Treatments   Labs (all labs  ordered are listed, but only abnormal results are displayed) Labs Reviewed - No data to display  EKG None  Radiology DG Chest 2 View  Result Date: 07/27/2020 CLINICAL DATA:  Cough.  Shortness of breath. EXAM: CHEST - 2 VIEW COMPARISON:  November 19, 2019 FINDINGS: The heart size and mediastinal contours are within normal limits. Both lungs are clear. The visualized skeletal structures are unremarkable. IMPRESSION: No active cardiopulmonary disease. Electronically Signed   By: Dorise Bullion III M.D   On: 07/27/2020 08:33    Procedures Procedures (including critical care time)  Medications Ordered in ED Medications  AeroChamber Plus Flo-Vu Medium MISC 1 each (1 each Other Given 07/27/20 0747)  albuterol (VENTOLIN HFA) 108 (90 Base) MCG/ACT inhaler 2 puff (2 puffs Inhalation Given 07/27/20 0746)  benzonatate (TESSALON) capsule 200 mg (200 mg Oral Given 07/27/20 0746)    ED Course  I have reviewed the triage vital signs and the nursing notes.  Pertinent labs & imaging results that were available during my care of the patient were reviewed by me and considered in my medical decision making (see chart for details).    MDM Rules/Calculators/A&P                          The patient does have a mild expiratory wheeze but otherwise his exam is unremarkable reassuring and he is able to speak in full sentences.  Vital signs reviewed as below and very stable.  Obtain chest x-ray to rule out pneumonia or pneumothorax, suspect allergic bronchitis or pneumonitis given his exposure at work in a [employer].  Will add a spacer to his albuterol inhaler, Tessalon Perles, continue prednisone, likely can stop the Augmentin, chest x-ray to rule out pneumonia first.  The patient is  agreeable  Vitals:   07/27/20 0731 07/27/20 0734  BP: 132/88   Pulse: 86   Resp: 20   Temp:  98.6 F (37 C)  SpO2: 96%      I do not think the patient needs a recurrent Covid test given his natural immunity after infection and his negative test several days ago.  He is not febrile  I have personally seen and interpretted the 2 view PA and Lateral chest xray - shows the following:   Normal lung fields without infiltrate  Normal cardiac silhouette without cardiomegaly  Normal mediastinum  Normal skin and soft tissues  No sub diaphragmatic air  Impression:  Unremarkable CXR  Pt updated on results.  Likely has some component of allergic bronchitis - no bacterial source visually or historically and is afebrile.  [name] was evaluated in Emergency Department on 07/27/2020 for the symptoms described in the history of present illness. He was evaluated in the context of the global COVID-19 pandemic, which necessitated consideration that the patient might be at risk for infection with the SARS-CoV-2 virus that causes COVID-19. Institutional protocols and algorithms that pertain to the evaluation of patients at risk for COVID-19 are in a state of rapid change based on information released by regulatory bodies including the CDC and federal and state organizations. These policies and algorithms were followed during the patient's care in the ED.   Final Clinical Impression(s) / ED Diagnoses Final diagnoses:  Bronchitis, allergic, unspecified asthma severity, with acute exacerbation    Rx / DC Orders ED Discharge Orders         Ordered    benzonatate (TESSALON) 100 MG capsule  3 times daily  PRN        07/27/20 9787           Noemi Chapel, MD 07/27/20 [phone]

## 2020-07-27 NOTE — Discharge Instructions (Signed)
I would recommend that you take the following medications  Zyrtec 10 mg a day, this only needs to be used during times where you are feeling coughing shortness of breath or wheezing Albuterol, 2 puffs every 4 hours as needed, use the spacer that we have provided for you, this will help you to get more of the medication and for it to be more effective Continue the steroids that you were given at the urgent care including prednisone You do not need to continue taking the antibiotic as you do not have a bacterial infection.  This means that antibiotics will not help  It is important to understand that oftentimes with bronchitis you will continue to cough sometimes for upwards of a month.  In addition to the above medications I have provided you a prescription for a prescription cough medication called Tessalon.  You may take up to 200 mg every 8 hours as needed for coughing.  This is nonsedating and safe to use while driving

## 2020-07-27 NOTE — ED Triage Notes (Signed)
Pt reports he has bee coughing for 3 days. He tested negative for covid on Friday. He went to urgent care on Wednesday. Also has had a sorethroat. Pt placed on augmentin and predisone

## 2020-07-28 ENCOUNTER — Encounter (HOSPITAL_COMMUNITY): Payer: Self-pay

## 2020-07-28 ENCOUNTER — Other Ambulatory Visit: Payer: Self-pay

## 2020-07-28 ENCOUNTER — Emergency Department (HOSPITAL_COMMUNITY)
Admission: EM | Admit: 2020-07-28 | Discharge: 2020-07-28 | Disposition: A | Discharge status: Home / Self Care | Payer: BC Managed Care – PPO | Attending: Emergency Medicine | Admitting: Emergency Medicine

## 2020-07-28 DIAGNOSIS — J209 Acute bronchitis, unspecified: Secondary | ICD-10-CM | POA: Diagnosis not present

## 2020-07-28 DIAGNOSIS — Z20822 Contact with and (suspected) exposure to covid-19: Secondary | ICD-10-CM | POA: Insufficient documentation

## 2020-07-28 DIAGNOSIS — R05 Cough: Secondary | ICD-10-CM | POA: Diagnosis not present

## 2020-07-28 LAB — SARS CORONAVIRUS 2 BY RT PCR (HOSPITAL ORDER, PERFORMED IN ~~LOC~~ HOSPITAL LAB): SARS Coronavirus 2: NEGATIVE

## 2020-07-28 NOTE — ED Provider Notes (Signed)
[facility] Provider Note   CSN: [id number] Arrival date & time: 07/28/20  1106     History Chief Complaint  Patient presents with  . Shortness of Breath    [name] is a 59 y.o. male.  HPI   59 year old male, I saw this patient yesterday because of coughing and shortness of breath.  At that time he had a negative work-up including a chest x-ray which showed no signs of pneumonia, thought to be bronchitis and thought to be allergic related to his atmosphere working in a [employer].  He had some wheezing, he was given an albuterol inhaler as well as a cough suppressant, he had already been treated by the urgent care with Augmentin and prednisone which did not seem to be helping either.  Ultimately the patient was stable with normal vital signs and was sent home successfully.  He comes back today stating that he is still coughing, still feeling intermittently short of breath and concerned that his symptoms are persistent and not better.  He reports having Covid last year, he was admitted for 5 days and ultimately successfully recovered.  Past Medical History:  Diagnosis Date  . Allergy   . Arthritis   . Calcaneus fracture, left   . Cancer [facility])    Skin cancer Left Arm  . GERD (gastroesophageal reflux disease)   . Small bowel obstruction [facility])     Patient Active Problem List   Diagnosis Date Noted  . 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 Numbness and tingling in right hand 10/27/2016  . Esophageal reflux 10/29/2014  . Primary osteoarthritis of right knee 10/22/2014  . Inguinal hernia 11/23/2013    Past Surgical History:  Procedure Laterality Date  . FOOT SURGERY    . HAND SURGERY Right    contracture of hand  . INGUINAL HERNIA REPAIR Right 11/23/2013   Procedure: HERNIA REPAIR INGUINAL ADULT;  Surgeon: Scherry Ran, MD;  Location: [facility];  Service: General;  Laterality: Right;  site-inguinal area  . OPEN REDUCTION, INTERNAL FIXATION  (ORIF) CALCANEAL FRACTURE WITH FUSION Left 11/30/2018   Procedure: OPEN REDUCTION, INTERNAL FIXATION (ORIF) CALCANEAL FRACTURE WITH PERONEAL DEBRIDEMENT & REPAIR OF SUPERIOR PERONEAL RETINACULUM;  Surgeon: Erle Crocker, MD;  Location: [place];  Service: Orthopedics;  Laterality: Left;  [facility] VASECTOMY    . WRIST SURGERY Right    otif       Family History  Problem Relation Age of Onset  . Stroke Mother   . Cancer Mother        colon  . Alzheimer's disease Father        58 years  . Heart disease Father        atrial fibri  . Heart disease Sister   . Early death Brother   . Cancer Maternal Grandmother     Social History   Tobacco Use  . Smoking status: Never Smoker  . Smokeless tobacco: Never Used  Vaping Use  . Vaping Use: Never used  Substance Use Topics  . Alcohol use: Yes    Alcohol/week: 4.0 standard drinks    Types: 4 Cans of beer per week    Comment: occ  . Drug use: No    Home Medications Prior to Admission medications   Medication Sig Start Date End Date Taking? Authorizing Provider  acetaminophen (TYLENOL) 325 MG tablet Take 2 tablets (650 mg total) by mouth every 6 (six) hours as needed for mild pain, moderate pain, fever  or headache. 11/23/19   Cherene Altes, MD  amoxicillin-clavulanate (AUGMENTIN) 875-125 MG tablet Take by mouth. 07/23/20 08/02/20  [provider]  benzonatate (TESSALON) 100 MG capsule Take 2 capsules (200 mg total) by mouth 3 (three) times daily as needed for cough. 07/27/20   Noemi Chapel, MD  brompheniramine-pseudoephedrine-DM 30-2-10 MG/5ML syrup Take by mouth. 07/23/20 07/28/20  [provider]  Naproxen Sod-diphenhydrAMINE (ALEVE PM PO) Take by mouth.    [provider]  pantoprazole (PROTONIX) 40 MG tablet Take 1 tablet (40 mg total) by mouth daily. TAKE ONE (1) TABLET EACH DAY.  Needs to be seen for future refills. 03/27/20   Dettinger, [name], MD  pravastatin (PRAVACHOL) 10 MG tablet Take 1 tablet (10 mg  total) by mouth at bedtime. 03/31/20   Dettinger, [name], MD  predniSONE (DELTASONE) 5 MG tablet Take 6-5-4-3-2-1 po qd 07/23/20   [provider]  PROAIR HFA 108 (90 Base) MCG/ACT inhaler USE 2 PUFFS EVERY 6 HOURS AS NEEDED Patient taking differently: Inhale 2 puffs into the lungs every 6 (six) hours as needed for wheezing or shortness of breath.  05/31/19   Dettinger, [name], MD    Allergies    Morphine and related and Bee venom  Review of Systems   Review of Systems  All other systems reviewed and are negative.   Physical Exam Updated Vital Signs BP 135/82 (BP Location: Right Arm)   Pulse 97   Temp 98.8 F (37.1 C) (Oral)   Resp 20   Ht 1.651 m (5\' 5" )   Wt 85.7 kg   SpO2 97%   BMI 31.45 kg/m   Physical Exam Vitals and nursing note reviewed.  Constitutional:      General: He is not in acute distress.    Appearance: He is well-developed.  HENT:     Head: Normocephalic and atraumatic.     Mouth/Throat:     Pharynx: No oropharyngeal exudate.  Eyes:     General: No scleral icterus.       Right eye: No discharge.        Left eye: No discharge.     Conjunctiva/sclera: Conjunctivae normal.     Pupils: Pupils are equal, round, and reactive to light.  Neck:     Thyroid: No thyromegaly.     Vascular: No JVD.  Cardiovascular:     Rate and Rhythm: Normal rate and regular rhythm.     Heart sounds: Normal heart sounds. No murmur heard.  No friction rub. No gallop.   Pulmonary:     Effort: Pulmonary effort is normal. No respiratory distress.     Breath sounds: Wheezing present. No rales.     Comments: The patient has bilateral forced expiratory wheezing but no increased work of breathing, no rales, no accessory muscle use, speaks in full sentences Abdominal:     General: Bowel sounds are normal. There is no distension.     Palpations: Abdomen is soft. There is no mass.     Tenderness: There is no abdominal tenderness.  Musculoskeletal:        General: No  tenderness. Normal range of motion.     Cervical back: Normal range of motion and neck supple.  Lymphadenopathy:     Cervical: No cervical adenopathy.  Skin:    General: Skin is warm and dry.     Findings: No erythema or rash.  Neurological:     Mental Status: He is alert.     Coordination: Coordination  normal.  Psychiatric:        Behavior: Behavior normal.     ED Results / Procedures / Treatments   Labs (all labs ordered are listed, but only abnormal results are displayed) Labs Reviewed  SARS CORONAVIRUS 2 BY RT PCR (HOSPITAL ORDER, [facility])    EKG None  Radiology DG Chest 2 View  Result Date: 07/27/2020 CLINICAL DATA:  Cough.  Shortness of breath. EXAM: CHEST - 2 VIEW COMPARISON:  November 19, 2019 FINDINGS: The heart size and mediastinal contours are within normal limits. Both lungs are clear. The visualized skeletal structures are unremarkable. IMPRESSION: No active cardiopulmonary disease. Electronically Signed   By: Dorise Bullion III M.D   On: 07/27/2020 08:33    Procedures Procedures (including critical care time)  Medications Ordered in ED Medications - No data to display  ED Course  I have reviewed the triage vital signs and the nursing notes.  Pertinent labs & imaging results that were available during my care of the patient were reviewed by me and considered in my medical decision making (see chart for details).    MDM Rules/Calculators/A&P                          X-ray from yesterday was normal, vital signs are unchanged, afebrile, not tachycardic, normal oxygen, normal blood pressure.  Patient has clinical bronchitis, given reassurance, will retest for Covid, stable for discharge, already has finished a steroid course as of today and has albuterol inhaler at home.  He is aware of the indications for return  Non bacterial bronchitis  Final Clinical Impression(s) / ED Diagnoses Final diagnoses:  Acute bronchitis,  unspecified organism      Noemi Chapel, MD 07/28/20 1206

## 2020-07-28 NOTE — ED Triage Notes (Signed)
Pt to er, pt states that he was here yesterday for the same thing, states that he is here for sob, pt also has a wet cough, states that he was swabbed on Wednesday and he was covid negative.

## 2020-07-28 NOTE — Discharge Instructions (Addendum)
We have tested you for COVID-19, your x-ray from yesterday was normal, your oxygen is normal, you may continue to call for the next couple of weeks.  You should see your doctor within the office within 1 week if you are still coughing, unless you develop fevers severe chest pain severe difficulty breathing or any worsening symptoms you can continue to treat this at home.  Again you may benefit from using an antihistamine such as Zyrtec or Claritin, try humidified air in your house as well.  If your COVID-19 test is positive you will be contacted by the hospital personnel

## 2020-10-03 ENCOUNTER — Other Ambulatory Visit: Payer: Self-pay

## 2020-10-03 ENCOUNTER — Ambulatory Visit: Payer: BC Managed Care – PPO | Admitting: Family Medicine

## 2020-10-03 ENCOUNTER — Encounter: Payer: Self-pay | Admitting: Family Medicine

## 2020-10-03 VITALS — BP 146/96 | HR 89 | Temp 97.0°F | Ht 65.0 in | Wt 194.0 lb

## 2020-10-03 DIAGNOSIS — J410 Simple chronic bronchitis: Secondary | ICD-10-CM | POA: Diagnosis not present

## 2020-10-03 DIAGNOSIS — K219 Gastro-esophageal reflux disease without esophagitis: Secondary | ICD-10-CM | POA: Diagnosis not present

## 2020-10-03 DIAGNOSIS — F419 Anxiety disorder, unspecified: Secondary | ICD-10-CM | POA: Diagnosis not present

## 2020-10-03 MED ORDER — SERTRALINE HCL 50 MG PO TABS: 50.0000 mg | ORAL_TABLET | Freq: Every day | ORAL | 5 refills | Status: DC

## 2020-10-03 MED ORDER — HYDROXYZINE HCL 10 MG PO TABS: 10.0000 mg | ORAL_TABLET | Freq: Three times a day (TID) | ORAL | 0 refills | Status: DC | PRN

## 2020-10-03 MED ORDER — PANTOPRAZOLE SODIUM 40 MG PO TBEC: 40.0000 mg | DELAYED_RELEASE_TABLET | Freq: Every day | ORAL | 3 refills | Status: DC

## 2020-10-03 NOTE — Progress Notes (Signed)
BP (!) 146/96   Pulse 89   Temp (!) 97 F (36.1 C)   Ht 5\' 5"  (1.651 m)   Wt 194 lb (88 kg)   SpO2 95%   BMI 32.28 kg/m    Subjective:   Patient ID: [name], male    DOB: 1961-09-02, 59 y.o.   MRN: [id number]  HPI: [name] is a 59 y.o. male presenting on 10/03/2020 for Referral to pulmonology (h/o bronchitis)   HPI Patient has had issues with recurrent bronchitis a few different occasions and most likely was in ER for treatment for this and they recommended that he go see a pulmonologist for further testing.  He has been using a ProAir inhaler and has been on steroids and he is feeling like his breathing is better but he just has had this recurrently. He denies any issues today and feels like things are back to almost to his baseline but he is just concerned about what is been left in his bronchitis especially they are concerned about post viral syndrome  GERD Patient is currently on pantoprazole.  She denies any major symptoms or abdominal pain or belching or burping. She denies any blood in her stool or lightheadedness or dizziness.   Patient also comes in complaining of anxiety that is been bothering him more.  He says that it has been building up more recently to where he gets increased stress.  He denies any full-blown panic attacks but he says he gets very shaky and nervous and his nerves are just building up to where he wants to do treatment for it.  He says is been worse over the past couple years with the pandemic.  He has never sought any treatment for this previously.  Relevant past medical, surgical, family and social history reviewed and updated as indicated. Interim medical history since our last visit reviewed. Allergies and medications reviewed and updated.  Review of Systems  Constitutional: Negative for chills and fever.  HENT: Positive for congestion.   Respiratory: Positive for cough and wheezing. Negative for shortness of breath.     Cardiovascular: Negative for chest pain and leg swelling.  Musculoskeletal: Negative for back pain and gait problem.  Skin: Negative for rash.  All other systems reviewed and are negative.   Per HPI unless specifically indicated above   Allergies as of 10/03/2020      Reactions   Morphine And Related Swelling   Bee Venom Rash      Medication List       Accurate as of October 03, 2020 11:59 PM. If you have any questions, ask your nurse or doctor.        STOP taking these medications   benzonatate 100 MG capsule Commonly known as: TESSALON Stopped by: Worthy Rancher, MD     TAKE these medications   acetaminophen 325 MG tablet Commonly known as: TYLENOL Take 2 tablets (650 mg total) by mouth every 6 (six) hours as needed for mild pain, moderate pain, fever or headache.   ALEVE PM PO Take by mouth.   hydrOXYzine 10 MG tablet Commonly known as: ATARAX/VISTARIL Take 1 tablet (10 mg total) by mouth 3 (three) times daily as needed. Started by: [name] Venissa Nappi, MD   pantoprazole 40 MG tablet Commonly known as: PROTONIX Take 1 tablet (40 mg total) by mouth daily. What changed: additional instructions Changed by: [name] Alekzander Cardell, MD   pravastatin 10 MG tablet Commonly known as: PRAVACHOL Take 1  tablet (10 mg total) by mouth at bedtime.   predniSONE 5 MG tablet Commonly known as: DELTASONE Take 6-5-4-3-2-1 po qd   ProAir HFA 108 (90 Base) MCG/ACT inhaler Generic drug: albuterol USE 2 PUFFS EVERY 6 HOURS AS NEEDED What changed: See the new instructions.   sertraline 50 MG tablet Commonly known as: Zoloft Take 1 tablet (50 mg total) by mouth daily. Started by: [name] Pierina Schuknecht, MD        Objective:   BP (!) 146/96   Pulse 89   Temp (!) 97 F (36.1 C)   Ht 5\' 5"  (1.651 m)   Wt 194 lb (88 kg)   SpO2 95%   BMI 32.28 kg/m   Wt Readings from Last 3 Encounters:  10/03/20 194 lb (88 kg)  07/28/20 189 lb (85.7 kg)  03/27/20 191 lb 8 oz (86.9  kg)    Physical Exam Vitals and nursing note reviewed.  Constitutional:      General: He is not in acute distress.    Appearance: He is well-developed. He is not diaphoretic.  Eyes:     General: No scleral icterus.    Conjunctiva/sclera: Conjunctivae normal.  Neck:     Thyroid: No thyromegaly.  Cardiovascular:     Rate and Rhythm: Normal rate and regular rhythm.     Heart sounds: Normal heart sounds. No murmur heard.   Pulmonary:     Effort: Pulmonary effort is normal. No respiratory distress.     Breath sounds: Normal breath sounds. No wheezing.  Musculoskeletal:        General: Normal range of motion.     Cervical back: Neck supple.  Lymphadenopathy:     Cervical: No cervical adenopathy.  Skin:    General: Skin is warm and dry.     Findings: No rash.  Neurological:     Mental Status: He is alert and oriented to person, place, and time.     Coordination: Coordination normal.  Psychiatric:        Behavior: Behavior normal.       Assessment & Plan:   Problem List Items Addressed This Visit      Digestive   Esophageal reflux   Relevant Medications   pantoprazole (PROTONIX) 40 MG tablet    Other Visit Diagnoses    Anxiety    -  Primary   Relevant Medications   hydrOXYzine (ATARAX/VISTARIL) 10 MG tablet   sertraline (ZOLOFT) 50 MG tablet   Simple chronic bronchitis (HCC)       Relevant Orders   Ambulatory referral to Pulmonology      Will refer to pulmonology for recurrent bronchitis and post viral syndrome.  We will start sertraline to help with his anxiety and give hydroxyzine just in case it builds up while the medicine is working into his system.  Continue pantoprazole for the patient from the emergency department. Follow up plan: Return in about 2 months (around 12/03/2020), or if symptoms worsen or fail to improve, for Follow-up anxiety.  Counseling provided for all of the vaccine components Orders Placed This Encounter  Procedures  . Ambulatory  referral to [place], MD [facility] 10/09/2020, 9:36 PM

## 2020-10-13 DIAGNOSIS — M79672 Pain in left foot: Secondary | ICD-10-CM | POA: Diagnosis not present

## 2020-10-27 ENCOUNTER — Telehealth: Payer: Self-pay

## 2020-10-27 MED ORDER — BENZONATATE 200 MG PO CAPS: 200.0000 mg | ORAL_CAPSULE | Freq: Two times a day (BID) | ORAL | 1 refills | Status: DC | PRN

## 2020-10-27 NOTE — Telephone Encounter (Signed)
lmtcb

## 2020-10-27 NOTE — Telephone Encounter (Signed)
I sent in [facility] for the patient again, if it works then we will continue with that.

## 2020-10-27 NOTE — Telephone Encounter (Signed)
Patient was previous on tessalon pearls - please advise

## 2020-10-27 NOTE — Telephone Encounter (Signed)
REFERRAL REQUEST Telephone Note  Have you been seen at our office for this problem? yes (Advise that they may need an appointment with their PCP before a referral can be done)  Reason for Referral: Difficulty Breathing Referral discussed with patient: yes Best contact number of patient for referral team: [phone]  Has patient been seen by a specialist for this issue before: yes Patient provider preference for referral: [facility] Patient location preference for referral: [place]-next to [facility]   Patient notified that referrals can take up to a week or longer to process. If they haven't heard anything within a week they should call back and speak with the referral department.   Dettinger's pt.  Please call pt.

## 2020-11-06 ENCOUNTER — Encounter: Payer: Self-pay | Admitting: Family Medicine

## 2020-11-06 ENCOUNTER — Ambulatory Visit: Payer: BC Managed Care – PPO | Admitting: Family Medicine

## 2020-11-06 ENCOUNTER — Other Ambulatory Visit: Payer: Self-pay

## 2020-11-06 VITALS — BP 130/77 | HR 95 | Temp 97.0°F | Ht 65.0 in | Wt 196.0 lb

## 2020-11-06 DIAGNOSIS — M25572 Pain in left ankle and joints of left foot: Secondary | ICD-10-CM | POA: Diagnosis not present

## 2020-11-06 MED ORDER — DICLOFENAC SODIUM 75 MG PO TBEC: 75.0000 mg | DELAYED_RELEASE_TABLET | Freq: Two times a day (BID) | ORAL | 3 refills | Status: DC

## 2020-11-06 NOTE — Progress Notes (Signed)
BP 130/77   Pulse 95   Temp (!) 97 F (36.1 C)   Ht 5\' 5"  (1.651 m)   Wt 196 lb (88.9 kg)   SpO2 96%   BMI 32.62 kg/m    Subjective:   Patient ID: [name], male    DOB: 1961/11/08, 59 y.o.   MRN: [id number]  HPI: [name] is a 59 y.o. male presenting on 11/06/2020 for Foot Pain (Left heel. Had fall in 2019 that shattered foot. S/P surgery. Needs FMLA completed.)   HPI Is coming today for left heel pain that he has been having consistently since he had a healed fracture in 2019, since then he does get some flareups that his foot will hurt him and he does have to miss some work occasionally for that when it flares up and swells up.  It does not usually put him out for more than a day.  Relevant past medical, surgical, family and social history reviewed and updated as indicated. Interim medical history since our last visit reviewed. Allergies and medications reviewed and updated.  Review of Systems  Constitutional: Negative for chills and fever.  Respiratory: Negative for shortness of breath and wheezing.   Cardiovascular: Negative for chest pain and leg swelling.  Musculoskeletal: Positive for arthralgias. Negative for back pain, gait problem and joint swelling.  Skin: Negative for rash.  All other systems reviewed and are negative.   Per HPI unless specifically indicated above   Allergies as of 11/06/2020      Reactions   Morphine And Related Swelling   Bee Venom Rash      Medication List       Accurate as of November 06, 2020  2:31 PM. If you have any questions, ask your nurse or doctor.        acetaminophen 325 MG tablet Commonly known as: TYLENOL Take 2 tablets (650 mg total) by mouth every 6 (six) hours as needed for mild pain, moderate pain, fever or headache.   ALEVE PM PO Take by mouth.   benzonatate 200 MG capsule Commonly known as: TESSALON Take 1 capsule (200 mg total) by mouth 2 (two) times daily as needed for cough.   diclofenac 75  MG EC tablet Commonly known as: VOLTAREN Take 1 tablet (75 mg total) by mouth 2 (two) times daily. Started by: [name] Jazzlyn Huizenga, MD   gabapentin 300 MG capsule Commonly known as: NEURONTIN Take 300 mg by mouth daily.   hydrOXYzine 10 MG tablet Commonly known as: ATARAX/VISTARIL Take 1 tablet (10 mg total) by mouth 3 (three) times daily as needed.   pantoprazole 40 MG tablet Commonly known as: PROTONIX Take 1 tablet (40 mg total) by mouth daily.   pravastatin 10 MG tablet Commonly known as: PRAVACHOL Take 1 tablet (10 mg total) by mouth at bedtime.   predniSONE 5 MG tablet Commonly known as: DELTASONE Take 6-5-4-3-2-1 po qd   ProAir HFA 108 (90 Base) MCG/ACT inhaler Generic drug: albuterol USE 2 PUFFS EVERY 6 HOURS AS NEEDED What changed: See the new instructions.   sertraline 50 MG tablet Commonly known as: Zoloft Take 1 tablet (50 mg total) by mouth daily.        Objective:   BP 130/77   Pulse 95   Temp (!) 97 F (36.1 C)   Ht 5\' 5"  (1.651 m)   Wt 196 lb (88.9 kg)   SpO2 96%   BMI 32.62 kg/m   Wt Readings from Last 3  Encounters:  11/06/20 196 lb (88.9 kg)  10/03/20 194 lb (88 kg)  07/28/20 189 lb (85.7 kg)    Physical Exam Vitals and nursing note reviewed.  Constitutional:      General: He is not in acute distress.    Appearance: Normal appearance.  Musculoskeletal:     Left ankle: Tenderness present. Normal range of motion.  Neurological:     Mental Status: He is alert.       Assessment & Plan:   Problem List Items Addressed This Visit    None    Visit Diagnoses    Arthralgia of left foot    -  Primary   Relevant Medications   diclofenac (VOLTAREN) 75 MG EC tablet      The FMLA for the patient, he misses about 1 day a month because of the arthritis due to his heel fracture. Follow up plan: Return if symptoms worsen or fail to improve.  Counseling provided for all of the vaccine components No orders of the defined types were  placed in this encounter.   Caryl Pina, MD [facility] 11/06/2020, 2:31 PM

## 2020-12-08 ENCOUNTER — Ambulatory Visit: Payer: BC Managed Care – PPO | Admitting: Family Medicine

## 2020-12-17 ENCOUNTER — Institutional Professional Consult (permissible substitution): Payer: BC Managed Care – PPO | Admitting: Internal Medicine

## 2020-12-17 NOTE — Progress Notes (Deleted)
   [name], male    DOB: 09/22/1961     MRN: [id number]   Brief patient profile:      History of Present Illness  12/17/2020  Pulmonary/ 1st office eval/ Melvyn Novas / [place] [facility]  No chief complaint on file.    Dyspnea:  *** Cough: *** Sleep: *** SABA use:   Past Medical History:  Diagnosis Date  . Allergy   . Arthritis   . Calcaneus fracture, left   . Cancer [facility])    Skin cancer Left Arm  . GERD (gastroesophageal reflux disease)   . Small bowel obstruction [facility])     Outpatient Medications Prior to Visit  Medication Sig Dispense Refill  . acetaminophen (TYLENOL) 325 MG tablet Take 2 tablets (650 mg total) by mouth every 6 (six) hours as needed for mild pain, moderate pain, fever or headache.    . benzonatate (TESSALON) 200 MG capsule Take 1 capsule (200 mg total) by mouth 2 (two) times daily as needed for cough. 60 capsule 1  . diclofenac (VOLTAREN) 75 MG EC tablet Take 1 tablet (75 mg total) by mouth 2 (two) times daily. 60 tablet 3  . gabapentin (NEURONTIN) 300 MG capsule Take 300 mg by mouth daily.    . hydrOXYzine (ATARAX/VISTARIL) 10 MG tablet Take 1 tablet (10 mg total) by mouth 3 (three) times daily as needed. 30 tablet 0  . Naproxen Sod-diphenhydrAMINE (ALEVE PM PO) Take by mouth.    . pantoprazole (PROTONIX) 40 MG tablet Take 1 tablet (40 mg total) by mouth daily. 90 tablet 3  . pravastatin (PRAVACHOL) 10 MG tablet Take 1 tablet (10 mg total) by mouth at bedtime. 90 tablet 3  . predniSONE (DELTASONE) 5 MG tablet Take 6-5-4-3-2-1 po qd    . PROAIR HFA 108 (90 Base) MCG/ACT inhaler USE 2 PUFFS EVERY 6 HOURS AS NEEDED (Patient taking differently: Inhale 2 puffs into the lungs every 6 (six) hours as needed for wheezing or shortness of breath. ) 8.5 g 3  . sertraline (ZOLOFT) 50 MG tablet Take 1 tablet (50 mg total) by mouth daily. 30 tablet 5   No facility-administered medications prior to visit.     Objective:     There were no vitals taken for this  visit.      I personally reviewed images and agree with radiology impression as follows:  CXR:   07/27/20 No active cardiopulmonary disease.     Assessment   No problem-specific Assessment & Plan notes found for this encounter.     Christinia Gully, MD 12/17/2020

## 2020-12-22 ENCOUNTER — Ambulatory Visit (INDEPENDENT_AMBULATORY_CARE_PROVIDER_SITE_OTHER): Payer: BC Managed Care – PPO | Admitting: Family Medicine

## 2020-12-22 ENCOUNTER — Encounter: Payer: Self-pay | Admitting: Family Medicine

## 2020-12-22 ENCOUNTER — Other Ambulatory Visit: Payer: Self-pay

## 2020-12-22 DIAGNOSIS — E782 Mixed hyperlipidemia: Secondary | ICD-10-CM

## 2020-12-22 DIAGNOSIS — J40 Bronchitis, not specified as acute or chronic: Secondary | ICD-10-CM

## 2020-12-22 DIAGNOSIS — M25572 Pain in left ankle and joints of left foot: Secondary | ICD-10-CM | POA: Diagnosis not present

## 2020-12-22 DIAGNOSIS — R6889 Other general symptoms and signs: Secondary | ICD-10-CM

## 2020-12-22 DIAGNOSIS — K219 Gastro-esophageal reflux disease without esophagitis: Secondary | ICD-10-CM

## 2020-12-22 MED ORDER — GABAPENTIN 300 MG PO CAPS: 300.0000 mg | ORAL_CAPSULE | Freq: Every day | ORAL | 3 refills | Status: DC

## 2020-12-22 MED ORDER — ALBUTEROL SULFATE HFA 108 (90 BASE) MCG/ACT IN AERS: INHALATION_SPRAY | RESPIRATORY_TRACT | 3 refills | Status: DC

## 2020-12-22 MED ORDER — PANTOPRAZOLE SODIUM 40 MG PO TBEC: 40.0000 mg | DELAYED_RELEASE_TABLET | Freq: Every day | ORAL | 3 refills | Status: DC

## 2020-12-22 MED ORDER — DICLOFENAC SODIUM 75 MG PO TBEC: 75.0000 mg | DELAYED_RELEASE_TABLET | Freq: Two times a day (BID) | ORAL | 3 refills | Status: DC

## 2020-12-22 NOTE — Progress Notes (Signed)
Virtual Visit via telephone Note  I connected with [name] on 12/22/20 at 0905 by telephone and verified that I am speaking with the correct person using two identifiers. [name] is currently located at home and patient are currently with her during visit. The provider, [name] Dettinger, MD is located in their office at time of visit.  Call ended at [phone]  I discussed the limitations, risks, security and privacy concerns of performing an evaluation and management service by telephone and the availability of in person appointments. I also discussed with the patient that there may be a patient responsible charge related to this service. The patient expressed understanding and agreed to proceed.   History and Present Illness: GERD Patient is currently on pantoprazole.  She denies any major symptoms or abdominal pain or belching or burping. She denies any blood in her stool or lightheadedness or dizziness.   Foot pain and arthritis and burning in foot.  Takes diclofenac and gabapentin and they work well to keep under control   1. Gastroesophageal reflux disease without esophagitis   2. Mixed hyperlipidemia   3. Arthralgia of left foot   4. Flu-like symptoms   5. Bronchitis     Outpatient Encounter Medications as of 12/22/2020  Medication Sig  . acetaminophen (TYLENOL) 325 MG tablet Take 2 tablets (650 mg total) by mouth every 6 (six) hours as needed for mild pain, moderate pain, fever or headache.  . albuterol (PROAIR HFA) 108 (90 Base) MCG/ACT inhaler USE 2 PUFFS EVERY 6 HOURS AS NEEDED  . diclofenac (VOLTAREN) 75 MG EC tablet Take 1 tablet (75 mg total) by mouth 2 (two) times daily.  Marland Kitchen gabapentin (NEURONTIN) 300 MG capsule Take 1 capsule (300 mg total) by mouth daily.  . hydrOXYzine (ATARAX/VISTARIL) 10 MG tablet Take 1 tablet (10 mg total) by mouth 3 (three) times daily as needed.  . Naproxen Sod-diphenhydrAMINE (ALEVE PM PO) Take by mouth.  . pantoprazole (PROTONIX)  40 MG tablet Take 1 tablet (40 mg total) by mouth daily.  . [DISCONTINUED] benzonatate (TESSALON) 200 MG capsule Take 1 capsule (200 mg total) by mouth 2 (two) times daily as needed for cough.  . [DISCONTINUED] diclofenac (VOLTAREN) 75 MG EC tablet Take 1 tablet (75 mg total) by mouth 2 (two) times daily.  . [DISCONTINUED] gabapentin (NEURONTIN) 300 MG capsule Take 300 mg by mouth daily.  . [DISCONTINUED] pantoprazole (PROTONIX) 40 MG tablet Take 1 tablet (40 mg total) by mouth daily.  . [DISCONTINUED] pravastatin (PRAVACHOL) 10 MG tablet Take 1 tablet (10 mg total) by mouth at bedtime.  . [DISCONTINUED] predniSONE (DELTASONE) 5 MG tablet Take 6-5-4-3-2-1 po qd  . [DISCONTINUED] PROAIR HFA 108 (90 Base) MCG/ACT inhaler USE 2 PUFFS EVERY 6 HOURS AS NEEDED (Patient taking differently: Inhale 2 puffs into the lungs every 6 (six) hours as needed for wheezing or shortness of breath. )  . [DISCONTINUED] sertraline (ZOLOFT) 50 MG tablet Take 1 tablet (50 mg total) by mouth daily.   No facility-administered encounter medications on file as of 12/22/2020.    Review of Systems  Constitutional: Negative for chills and fever.  Eyes: Negative for visual disturbance.  Respiratory: Negative for shortness of breath and wheezing.   Cardiovascular: Negative for chest pain and leg swelling.  Musculoskeletal: Positive for arthralgias and myalgias. Negative for back pain and gait problem.  Skin: Negative for rash.  All other systems reviewed and are negative.   Observations/Objective: Patient sounds comfortable and in no  acute distress  Assessment and Plan: Problem List Items Addressed This Visit      Digestive   Esophageal reflux - Primary   Relevant Medications   pantoprazole (PROTONIX) 40 MG tablet   Other Relevant Orders   CBC with Differential/Platelet   CMP14+EGFR    Other Visit Diagnoses    Mixed hyperlipidemia       Relevant Orders   CMP14+EGFR   Lipid panel   Arthralgia of left foot        Relevant Medications   diclofenac (VOLTAREN) 75 MG EC tablet   gabapentin (NEURONTIN) 300 MG capsule   Flu-like symptoms       Relevant Medications   albuterol (PROAIR HFA) 108 (90 Base) MCG/ACT inhaler   Bronchitis       Relevant Medications   albuterol (PROAIR HFA) 108 (90 Base) MCG/ACT inhaler      Patient sounds comfortable and in no acute distress Follow up plan: Return in about 6 months (around 06/21/2021), or if symptoms worsen or fail to improve, for gerd and hld and foot.     I discussed the assessment and treatment plan with the patient. The patient was provided an opportunity to ask questions and all were answered. The patient agreed with the plan and demonstrated an understanding of the instructions.   The patient was advised to call back or seek an in-person evaluation if the symptoms worsen or if the condition fails to improve as anticipated.  The above assessment and management plan was discussed with the patient. The patient verbalized understanding of and has agreed to the management plan. Patient is aware to call the clinic if symptoms persist or worsen. Patient is aware when to return to the clinic for a follow-up visit. Patient educated on when it is appropriate to go to the emergency department.    I provided 11 minutes of non-face-to-face time during this encounter.    Worthy Rancher, MD

## 2020-12-26 ENCOUNTER — Other Ambulatory Visit: Payer: BC Managed Care – PPO

## 2021-01-16 ENCOUNTER — Encounter: Payer: Self-pay | Admitting: Family Medicine

## 2021-01-16 ENCOUNTER — Other Ambulatory Visit: Payer: Self-pay

## 2021-01-16 ENCOUNTER — Ambulatory Visit (INDEPENDENT_AMBULATORY_CARE_PROVIDER_SITE_OTHER): Payer: BC Managed Care – PPO | Admitting: Family Medicine

## 2021-01-16 VITALS — BP 123/80 | HR 79 | Temp 98.2°F | Ht 65.0 in | Wt 197.8 lb

## 2021-01-16 DIAGNOSIS — M79672 Pain in left foot: Secondary | ICD-10-CM

## 2021-01-16 DIAGNOSIS — E785 Hyperlipidemia, unspecified: Secondary | ICD-10-CM

## 2021-01-16 DIAGNOSIS — G8929 Other chronic pain: Secondary | ICD-10-CM

## 2021-01-16 LAB — CBC WITH DIFFERENTIAL/PLATELET
Basophils Absolute: 0.1 10*3/uL (ref 0.0–0.2)
Basos: 1 %
EOS (ABSOLUTE): 0.1 10*3/uL (ref 0.0–0.4)
Eos: 1 %
Hematocrit: 48.3 % (ref 37.5–51.0)
Hemoglobin: 16.8 g/dL (ref 13.0–17.7)
Immature Grans (Abs): 0 10*3/uL (ref 0.0–0.1)
Immature Granulocytes: 0 %
Lymphocytes Absolute: 2.3 10*3/uL (ref 0.7–3.1)
Lymphs: 28 %
MCH: 33.5 pg — ABNORMAL HIGH (ref 26.6–33.0)
MCHC: 34.8 g/dL (ref 31.5–35.7)
MCV: 96 fL (ref 79–97)
Monocytes Absolute: 0.7 10*3/uL (ref 0.1–0.9)
Monocytes: 8 %
Neutrophils Absolute: 5.2 10*3/uL (ref 1.4–7.0)
Neutrophils: 62 %
Platelets: 274 10*3/uL (ref 150–450)
RBC: 5.02 x10E6/uL (ref 4.14–5.80)
RDW: 12.2 % (ref 11.6–15.4)
WBC: 8.4 10*3/uL (ref 3.4–10.8)

## 2021-01-16 LAB — CMP14+EGFR
ALT: 59 IU/L — ABNORMAL HIGH (ref 0–44)
AST: 32 IU/L (ref 0–40)
Albumin/Globulin Ratio: 1.8 (ref 1.2–2.2)
Albumin: 4.4 g/dL (ref 3.8–4.9)
Alkaline Phosphatase: 84 IU/L (ref 44–121)
BUN/Creatinine Ratio: 12 (ref 9–20)
BUN: 12 mg/dL (ref 6–24)
Bilirubin Total: 0.4 mg/dL (ref 0.0–1.2)
CO2: 18 mmol/L — ABNORMAL LOW (ref 20–29)
Calcium: 9.2 mg/dL (ref 8.7–10.2)
Chloride: 103 mmol/L (ref 96–106)
Creatinine, Ser: 1.04 mg/dL (ref 0.76–1.27)
GFR calc Af Amer: 92 mL/min/{1.73_m2} (ref >59)
GFR calc non Af Amer: 79 mL/min/{1.73_m2} (ref >59)
Globulin, Total: 2.4 g/dL (ref 1.5–4.5)
Glucose: 96 mg/dL (ref 65–99)
Potassium: 4 mmol/L (ref 3.5–5.2)
Sodium: 140 mmol/L (ref 134–144)
Total Protein: 6.8 g/dL (ref 6.0–8.5)

## 2021-01-16 LAB — LIPID PANEL
Chol/HDL Ratio: 5.7 ratio — ABNORMAL HIGH (ref 0.0–5.0)
Cholesterol, Total: 206 mg/dL — ABNORMAL HIGH (ref 100–199)
HDL: 36 mg/dL — ABNORMAL LOW (ref >39)
LDL Chol Calc (NIH): 142 mg/dL — ABNORMAL HIGH (ref 0–99)
Triglycerides: 157 mg/dL — ABNORMAL HIGH (ref 0–149)
VLDL Cholesterol Cal: 28 mg/dL (ref 5–40)

## 2021-01-16 MED ORDER — IBUPROFEN 800 MG PO TABS: 800.0000 mg | ORAL_TABLET | Freq: Three times a day (TID) | ORAL | 5 refills | Status: DC | PRN

## 2021-01-16 NOTE — Patient Instructions (Signed)

## 2021-01-16 NOTE — Progress Notes (Signed)
Acute Office Visit  Subjective:    Patient ID: [name], male    DOB: June 23, 1961, 60 y.o.   MRN: [id number]  Chief Complaint  Patient presents with  . Foot Pain    Left foot pain. Hx of surgery. 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 Has heel support. He needs bone infusion. Wants something for pain when he works. He is asking for IBP 862m . He has taken aleve and tylenol with no relief.    HPI Patient is in today for chronic left foot pain. He reports pain in the bottom of his foot for years. He has had surgery on this foot and has pins in place. He sees an orthopedic and has been told that he really needs a bone fusion for this foot, but he is trying to put it off as long as possible. The pain is worse at work because he works long hours on concrete floors. The pain is up to a 9 while working. The pain is a 0 when he taking 800 mg of ibuprofen. He would like a prescription for this to take on the days that he works. He also takes gabapentin with some relief. He has not been taking aleve or ibuprofen with voltaren. He takes protonix.   While he is here in the office, he would like to have lab work done. His last appointment was a phone visit and his PCP requested he come in for lab work but he has not had this done yet. He is fasting for this. His last cholesterol panel was elevated. He does not watch his diet and does not exercise. He does not take a statin. He is willing to take a statin if needed.     Past Medical History:  Diagnosis Date  . Allergy   . Arthritis   . Calcaneus fracture, left   . Cancer ([facility]    Skin cancer Left Arm  . GERD (gastroesophageal reflux disease)   . Small bowel obstruction ([facility]     Past Surgical History:  Procedure Laterality Date  . FOOT SURGERY    . HAND SURGERY Right    contracture of hand  . INGUINAL HERNIA REPAIR Right 11/23/2013   Procedure: HERNIA REPAIR INGUINAL ADULT;  Surgeon: WScherry Ran MD;  Location: [facility];  Service: General;   Laterality: Right;  site-inguinal area  . OPEN REDUCTION, INTERNAL FIXATION (ORIF) CALCANEAL FRACTURE WITH FUSION Left 11/30/2018   Procedure: OPEN REDUCTION, INTERNAL FIXATION (ORIF) CALCANEAL FRACTURE WITH PERONEAL DEBRIDEMENT & REPAIR OF SUPERIOR PERONEAL RETINACULUM;  Surgeon: AErle Crocker MD;  Location[facility]  Service: Orthopedics;  Laterality: Left;  .Marland KitchenVASECTOMY    . WRIST SURGERY Right    otif    Family History  Problem Relation Age of Onset  . Stroke Mother   . Cancer Mother        colon  . Alzheimer's disease Father        829years  . Heart disease Father        atrial fibri  . Heart disease Sister   . Early death Brother   . Cancer Maternal Grandmother     Social History   Socioeconomic History  . Marital status: Married    Spouse name: Not on file  . Number of children: Not on file  . Years of education: Not on file  . Highest education level: Not on file  Occupational History  . Not on file  Tobacco Use  .  Smoking status: Never Smoker  . Smokeless tobacco: Never Used  Vaping Use  . Vaping Use: Never used  Substance and Sexual Activity  . Alcohol use: Yes    Alcohol/week: 4.0 standard drinks    Types: 4 Cans of beer per week    Comment: occ  . Drug use: No  . Sexual activity: Yes    Birth control/protection: None    Comment: married 2005  Other Topics Concern  . Not on file  Social History Narrative  . Not on file   Social Determinants of Health   Financial Resource Strain: Not on file  Food Insecurity: Not on file  Transportation Needs: Not on file  Physical Activity: Not on file  Stress: Not on file  Social Connections: Not on file  Intimate Partner Violence: Not on file    Outpatient Medications Prior to Visit  Medication Sig Dispense Refill  . acetaminophen (TYLENOL) 325 MG tablet Take 2 tablets (650 mg total) by mouth every 6 (six) hours as needed for mild pain, moderate pain, fever or headache.    . albuterol (PROAIR HFA) 108  (90 Base) MCG/ACT inhaler USE 2 PUFFS EVERY 6 HOURS AS NEEDED 8.5 g 3  . diclofenac (VOLTAREN) 75 MG EC tablet Take 1 tablet (75 mg total) by mouth 2 (two) times daily. 180 tablet 3  . gabapentin (NEURONTIN) 300 MG capsule Take 1 capsule (300 mg total) by mouth daily. 90 capsule 3  . Naproxen Sod-diphenhydrAMINE (ALEVE PM PO) Take by mouth.    . pantoprazole (PROTONIX) 40 MG tablet Take 1 tablet (40 mg total) by mouth daily. 90 tablet 3  . hydrOXYzine (ATARAX/VISTARIL) 10 MG tablet Take 1 tablet (10 mg total) by mouth 3 (three) times daily as needed. (Patient not taking: Reported on 01/16/2021) 30 tablet 0   No facility-administered medications prior to visit.    Allergies  Allergen Reactions  . Morphine And Related Swelling  . Bee Venom Rash    Review of Systems As per HPI.     Objective:    Physical Exam Vitals and nursing note reviewed.  Constitutional:      General: He is not in acute distress.    Appearance: Normal appearance. He is not ill-appearing.  Cardiovascular:     Rate and Rhythm: Normal rate and regular rhythm.     Pulses: Normal pulses.     Heart sounds: Normal heart sounds. No murmur heard.   Pulmonary:     Effort: Pulmonary effort is normal. No respiratory distress.     Breath sounds: Normal breath sounds.  Abdominal:     General: Bowel sounds are normal. There is no distension.     Palpations: Abdomen is soft. There is no mass.     Tenderness: There is no abdominal tenderness. There is no guarding or rebound.  Musculoskeletal:     Cervical back: Neck supple. No tenderness.     Right lower leg: No edema.     Left lower leg: No edema.  Lymphadenopathy:     Cervical: No cervical adenopathy.  Skin:    General: Skin is warm and dry.  Neurological:     General: No focal deficit present.     Mental Status: He is alert and oriented to person, place, and time.  Psychiatric:        Mood and Affect: Mood normal.        Behavior: Behavior normal.     BP  123/80   Pulse 79   Temp  98.2 F (36.8 C) (Temporal)   Ht 5' 5" (1.651 m)   Wt 197 lb 12.8 oz (89.7 kg)   SpO2 96%   BMI 32.92 kg/m  Wt Readings from Last 3 Encounters:  01/16/21 197 lb 12.8 oz (89.7 kg)  11/06/20 196 lb (88.9 kg)  10/03/20 194 lb (88 kg)    Health Maintenance Due  Topic Date Due  . COLONOSCOPY (Pts 45-31yr Insurance coverage will need to be confirmed)  Never done    There are no preventive care reminders to display for this patient.   Lab Results  Component Value Date   TSH 1.130 08/13/2016   Lab Results  Component Value Date   WBC 8.7 03/27/2020   HGB 17.5 03/27/2020   HCT 49.9 03/27/2020   MCV 96 03/27/2020   PLT 289 03/27/2020   Lab Results  Component Value Date   NA 139 03/27/2020   K 4.5 03/27/2020   CO2 26 03/27/2020   GLUCOSE 105 (H) 03/27/2020   BUN 12 03/27/2020   CREATININE 0.89 03/27/2020   BILITOT 0.4 03/27/2020   ALKPHOS 97 03/27/2020   AST 22 03/27/2020   ALT 26 03/27/2020   PROT 7.1 03/27/2020   ALBUMIN 4.6 03/27/2020   CALCIUM 9.7 03/27/2020   ANIONGAP 10 11/23/2019   Lab Results  Component Value Date   CHOL 222 (H) 03/27/2020   Lab Results  Component Value Date   HDL 37 (L) 03/27/2020   Lab Results  Component Value Date   LDLCALC 139 (H) 03/27/2020   Lab Results  Component Value Date   TRIG 254 (H) 03/27/2020   Lab Results  Component Value Date   CHOLHDL 6.0 (H) 03/27/2020   Lab Results  Component Value Date   HGBA1C 6.0 (H) 11/19/2019       Assessment & Plan:   KMorleywas seen today for foot pain.  Diagnoses and all orders for this visit:  Chronic foot pain, left Sees orthopedic. Reiterated to patient that he should not take ibuprofen with diclofenc, he verbalizes understanding of this. Can continue gabapentin.  -     ibuprofen (ADVIL) 800 MG tablet; Take 1 tablet (800 mg total) by mouth every 8 (eight) hours as needed.  Hyperlipidemia, unspecified hyperlipidemia type Fasting lab work as  below. Patient is willing to start statin therapy if needed. Diet and exercise.  -     CBC with Differential/Platelet -     CMP14+EGFR -     Lipid panel   Follow up in 5 months with PCP, sooner if needed.   The patient indicates understanding of these issues and agrees with the plan.   TGwenlyn Perking FNP

## 2021-01-19 ENCOUNTER — Other Ambulatory Visit: Payer: Self-pay | Admitting: Family Medicine

## 2021-01-19 DIAGNOSIS — E782 Mixed hyperlipidemia: Secondary | ICD-10-CM

## 2021-01-19 MED ORDER — ROSUVASTATIN CALCIUM 10 MG PO TABS: 10.0000 mg | ORAL_TABLET | Freq: Every day | ORAL | 3 refills | Status: DC

## 2021-01-27 DIAGNOSIS — R42 Dizziness and giddiness: Secondary | ICD-10-CM | POA: Diagnosis not present

## 2021-03-16 ENCOUNTER — Encounter: Payer: Self-pay | Admitting: Nurse Practitioner

## 2021-03-16 ENCOUNTER — Ambulatory Visit: Payer: BC Managed Care – PPO | Admitting: Nurse Practitioner

## 2021-03-16 ENCOUNTER — Other Ambulatory Visit: Payer: Self-pay

## 2021-03-16 VITALS — BP 149/99 | HR 72 | Temp 97.1°F | Ht 65.0 in | Wt 197.0 lb

## 2021-03-16 DIAGNOSIS — R42 Dizziness and giddiness: Secondary | ICD-10-CM | POA: Insufficient documentation

## 2021-03-16 MED ORDER — MECLIZINE HCL 25 MG PO TABS: 25.0000 mg | ORAL_TABLET | Freq: Three times a day (TID) | ORAL | 0 refills | Status: DC | PRN

## 2021-03-16 NOTE — Progress Notes (Signed)
Acute Office Visit  Subjective:    Patient ID: [name], male    DOB: [birth year], 60 y.o.   MRN: [id number]  Chief Complaint  Patient presents with  . Dizziness    HPI Patient is in today for  Dizziness  He reports chronic dizziness. He describes it as feeling light headed, feeling like room is spinning and feeling unbalanced, occurs intermittently, and typically lasts few munites.  It typically occurs when he is lying still and tilting head up. It is usually relieved by sitting still and standing still. He has started new medications around the time the dizziness started.  Meclizine 25 mg tablet by mouth as needed.  Patient is reporting he has not been compliant with medication and has been out of medication for a while.  Patient is also reporting eye pressure/pain in the left eye.    Associated symptoms: No hearing loss Yes tinnitus  No chest discomfort No heart palpitations  No heart racing No numbness or tingling of extremities  No nausea No vomiting  No speech difficulty No visual changes    Wt Readings from Last 3 Encounters:  03/16/21 197 lb (89.4 kg)  01/16/21 197 lb 12.8 oz (89.7 kg)  11/06/20 196 lb (88.9 kg)    BP Readings from Last 3 Encounters:  03/16/21 (!) 149/99  01/16/21 123/80  11/06/20 130/77      Lab Results  Component Value Date   WBC 8.4 01/16/2021   HGB 16.8 01/16/2021   HCT 48.3 01/16/2021   MCV 96 01/16/2021   PLT 274 01/16/2021   Lab Results  Component Value Date   NA 140 01/16/2021   K 4.0 01/16/2021   CO2 18 (L) 01/16/2021   BUN 12 01/16/2021   CREATININE 1.04 01/16/2021   CALCIUM 9.2 01/16/2021   GLUCOSE 96 01/16/2021     ---------------------------------------------------------------------------------------------------  Past Medical History:  Diagnosis Date  . Allergy   . Arthritis   . Calcaneus fracture, left   . Cancer [facility])    Skin cancer Left Arm  . GERD (gastroesophageal reflux disease)   . Small bowel  obstruction [facility])     Past Surgical History:  Procedure Laterality Date  . FOOT SURGERY    . HAND SURGERY Right    contracture of hand  . INGUINAL HERNIA REPAIR Right 11/23/2013   Procedure: HERNIA REPAIR INGUINAL ADULT;  Surgeon: Scherry Ran, MD;  Location: [facility];  Service: General;  Laterality: Right;  site-inguinal area  . OPEN REDUCTION, INTERNAL FIXATION (ORIF) CALCANEAL FRACTURE WITH FUSION Left 11/30/2018   Procedure: OPEN REDUCTION, INTERNAL FIXATION (ORIF) CALCANEAL FRACTURE WITH PERONEAL DEBRIDEMENT & REPAIR OF SUPERIOR PERONEAL RETINACULUM;  Surgeon: Erle Crocker, MD;  Location: [facility];  Service: Orthopedics;  Laterality: Left;  Marland Kitchen VASECTOMY    . WRIST SURGERY Right    otif    Family History  Problem Relation Age of Onset  . Stroke Mother   . Cancer Mother        colon  . Alzheimer's disease Father        105 years  . Heart disease Father        atrial fibri  . Heart disease Sister   . Early death Brother   . Cancer Maternal Grandmother     Social History   Socioeconomic History  . Marital status: Married    Spouse name: Not on file  . Number of children: Not on file  . Years of education: Not on  file  . Highest education level: Not on file  Occupational History  . Not on file  Tobacco Use  . Smoking status: Never Smoker  . Smokeless tobacco: Never Used  Vaping Use  . Vaping Use: Never used  Substance and Sexual Activity  . Alcohol use: Yes    Alcohol/week: 4.0 standard drinks    Types: 4 Cans of beer per week    Comment: occ  . Drug use: No  . Sexual activity: Yes    Birth control/protection: None    Comment: married 2005  Other Topics Concern  . Not on file  Social History Narrative  . Not on file   Social Determinants of Health   Financial Resource Strain: Not on file  Food Insecurity: Not on file  Transportation Needs: Not on file  Physical Activity: Not on file  Stress: Not on file  Social Connections: Not on file   Intimate Partner Violence: Not on file    Outpatient Medications Prior to Visit  Medication Sig Dispense Refill  . acetaminophen (TYLENOL) 325 MG tablet Take 2 tablets (650 mg total) by mouth every 6 (six) hours as needed for mild pain, moderate pain, fever or headache.    . albuterol (PROAIR HFA) 108 (90 Base) MCG/ACT inhaler USE 2 PUFFS EVERY 6 HOURS AS NEEDED 8.5 g 3  . diclofenac (VOLTAREN) 75 MG EC tablet Take 1 tablet (75 mg total) by mouth 2 (two) times daily. 180 tablet 3  . gabapentin (NEURONTIN) 300 MG capsule Take 1 capsule (300 mg total) by mouth daily. 90 capsule 3  . hydrOXYzine (ATARAX/VISTARIL) 10 MG tablet Take 1 tablet (10 mg total) by mouth 3 (three) times daily as needed. (Patient not taking: Reported on 01/16/2021) 30 tablet 0  . ibuprofen (ADVIL) 800 MG tablet Take 1 tablet (800 mg total) by mouth every 8 (eight) hours as needed. 30 tablet 5  . pantoprazole (PROTONIX) 40 MG tablet Take 1 tablet (40 mg total) by mouth daily. 90 tablet 3  . rosuvastatin (CRESTOR) 10 MG tablet Take 1 tablet (10 mg total) by mouth daily. 90 tablet 3   No facility-administered medications prior to visit.    Allergies  Allergen Reactions  . Morphine And Related Swelling  . Bee Venom Rash    Review of Systems  Constitutional: Negative.   HENT: Positive for tinnitus. Negative for ear pain.   Respiratory: Negative.   Cardiovascular: Negative.   Gastrointestinal: Negative.   Genitourinary: Negative.   Musculoskeletal: Negative.   All other systems reviewed and are negative.      Objective:    Physical Exam Vitals reviewed.  Constitutional:      Appearance: Normal appearance.  HENT:     Right Ear: There is no impacted cerumen.     Left Ear: There is no impacted cerumen.     Nose: Nose normal.  Eyes:     Conjunctiva/sclera: Conjunctivae normal.  Cardiovascular:     Rate and Rhythm: Normal rate and regular rhythm.     Pulses: Normal pulses.     Heart sounds: Normal heart  sounds.  Pulmonary:     Effort: Pulmonary effort is normal.     Breath sounds: Normal breath sounds.  Abdominal:     General: Bowel sounds are normal.  Musculoskeletal:        General: Normal range of motion.  Skin:    General: Skin is warm.  Neurological:     Mental Status: He is alert and oriented to person,  place, and time.  Psychiatric:        Behavior: Behavior normal.     BP (!) 149/99 Comment: standing  Pulse 72   Temp (!) 97.1 F (36.2 C) (Temporal)   Ht 5\' 5"  (1.651 m)   Wt 197 lb (89.4 kg)   SpO2 97%   BMI 32.78 kg/m  Wt Readings from Last 3 Encounters:  03/16/21 197 lb (89.4 kg)  01/16/21 197 lb 12.8 oz (89.7 kg)  11/06/20 196 lb (88.9 kg)    Health Maintenance Due  Topic Date Due  . COLONOSCOPY (Pts 45-70yrs Insurance coverage will need to be confirmed)  Never done    There are no preventive care reminders to display for this patient.   Lab Results  Component Value Date   TSH 1.130 08/13/2016   Lab Results  Component Value Date   WBC 8.4 01/16/2021   HGB 16.8 01/16/2021   HCT 48.3 01/16/2021   MCV 96 01/16/2021   PLT 274 01/16/2021   Lab Results  Component Value Date   NA 140 01/16/2021   K 4.0 01/16/2021   CO2 18 (L) 01/16/2021   GLUCOSE 96 01/16/2021   BUN 12 01/16/2021   CREATININE 1.04 01/16/2021   BILITOT 0.4 01/16/2021   ALKPHOS 84 01/16/2021   AST 32 01/16/2021   ALT 59 (H) 01/16/2021   PROT 6.8 01/16/2021   ALBUMIN 4.4 01/16/2021   CALCIUM 9.2 01/16/2021   ANIONGAP 10 11/23/2019   Lab Results  Component Value Date   CHOL 206 (H) 01/16/2021   Lab Results  Component Value Date   HDL 36 (L) 01/16/2021   Lab Results  Component Value Date   LDLCALC 142 (H) 01/16/2021   Lab Results  Component Value Date   TRIG 157 (H) 01/16/2021   Lab Results  Component Value Date   CHOLHDL 5.7 (H) 01/16/2021   Lab Results  Component Value Date   HGBA1C 6.0 (H) 11/19/2019       Assessment & Plan:   Problem List Items  Addressed This Visit      Other   Vertigo - Primary    This is a chronic symptom for patient for several years.  Patient has been treated in the past with meclizine.  Patient ran out of medication and has not taken medication for a few weeks.  Vertigo symptoms not well controlled.  Patient is reporting headache, pain behind left eye.  Tinnitus.  He denies nausea vomiting and visual disturbance.  Blood pressure elevated in clinic today 150/99, repeat 149/99 and after her visit 150/101.  Patient reports his blood pressure is within normal limits at home and has never been elevated.  Even with symptoms of vertigo.  Patient is right now leaving clinic to an eye appointment where his eyes will be checked.  Patient reports he has not been to the eye doctor in a while.  On assessment patient's ears no inflammation or fluids found.  Ear canal is free of cerumen buildup.  Patient does report using 800 mg ibuprofen every 8 hours due to pain in the left ankle from an old injury.  And that may have also affected elevation in blood pressure this morning.  Completed referral to physical therapy for Epley maneuver to help with vertigo.  Referral discussed with patient.  Advised patient to follow-up with worsening unresolved symptoms.  Meclizine reordered medication sent to pharmacy.  Education provided with printed handouts given.      Relevant Medications   meclizine (  ANTIVERT) 25 MG tablet   Other Relevant Orders   Ambulatory referral to Physical Therapy       Meds ordered this encounter  Medications  . meclizine (ANTIVERT) 25 MG tablet    Sig: Take 1 tablet (25 mg total) by mouth 3 (three) times daily as needed for dizziness.    Dispense:  30 tablet    Refill:  0    Order Specific Question:   Supervising Provider    Answer:   Janora Norlander [[id number]]     Ivy Lynn, NP

## 2021-03-16 NOTE — Assessment & Plan Note (Signed)
This is a chronic symptom for patient for several years.  Patient has been treated in the past with meclizine.  Patient ran out of medication and has not taken medication for a few weeks.  Vertigo symptoms not well controlled.  Patient is reporting headache, pain behind left eye.  Tinnitus.  He denies nausea vomiting and visual disturbance.  Blood pressure elevated in clinic today 150/99, repeat 149/99 and after her visit 150/101.  Patient reports his blood pressure is within normal limits at home and has never been elevated.  Even with symptoms of vertigo.  Patient is right now leaving clinic to an eye appointment where his eyes will be checked.  Patient reports he has not been to the eye doctor in a while.  On assessment patient's ears no inflammation or fluids found.  Ear canal is free of cerumen buildup.  Patient does report using 800 mg ibuprofen every 8 hours due to pain in the left ankle from an old injury.  And that may have also affected elevation in blood pressure this morning.  Completed referral to physical therapy for Epley maneuver to help with vertigo.  Referral discussed with patient.  Advised patient to follow-up with worsening unresolved symptoms.  Meclizine reordered medication sent to pharmacy.  Education provided with printed handouts given.

## 2021-03-16 NOTE — Patient Instructions (Signed)

## 2021-03-30 ENCOUNTER — Encounter: Payer: Self-pay | Admitting: Nurse Practitioner

## 2021-03-30 ENCOUNTER — Other Ambulatory Visit: Payer: Self-pay

## 2021-03-30 ENCOUNTER — Ambulatory Visit: Payer: BC Managed Care – PPO | Admitting: Nurse Practitioner

## 2021-03-30 VITALS — BP 134/92 | HR 74 | Temp 97.1°F | Ht 65.0 in | Wt 199.0 lb

## 2021-03-30 DIAGNOSIS — R42 Dizziness and giddiness: Secondary | ICD-10-CM

## 2021-03-30 DIAGNOSIS — M79672 Pain in left foot: Secondary | ICD-10-CM | POA: Insufficient documentation

## 2021-03-30 NOTE — Assessment & Plan Note (Signed)
This is a chronic problem for patient.  He reports falling 10 feet about 10 years ago and has consistently had foot pain.  Patient has already been to orthopedic and is on prophylactic ibuprofen 800 mg tablet every 8 hours as needed.  Continue to provide education to patient to rest feet as he is on his feet 12 hours for work.  Wear comfortable shoes, elevate feet and soak feet in Epson salt, ice or warm compress as tolerated to provide pain/symptoms relief.    Follow-up with worsening unresolved symptoms.  Education provided with printed handouts given.

## 2021-03-30 NOTE — Patient Instructions (Signed)
vertigo resolved , follow up as needed with any new  signs and symptoms   Leg Cramps Leg cramps occur when one or more muscles tighten and a person has no control over it (involuntary muscle contraction). Muscle cramps are most common in the calf muscles of the leg. They can occur during exercise or at rest. Leg cramps are painful, and they may last for a few seconds to a few minutes. Cramps may return several times before they finally stop. Usually, leg cramps are not caused by a serious medical problem. In many cases, the cause is not known. Some common causes include:  Excessive physical effort (overexertion), such as during intense exercise.  Doing the same motion over and over.  Staying in a certain position for a long period of time.  Improper preparation, form, or technique while doing a sport or an activity.  Dehydration.  Injury.  Side effects of certain medicines.  Abnormally low levels of minerals in your blood (electrolytes), especially potassium and calcium. This could result from: ? Pregnancy. ? Taking diuretic medicines. Follow these instructions at home: Eating and drinking  Drink enough fluid to keep your urine pale yellow. Staying hydrated may help prevent cramps.  Eat a healthy diet that includes plenty of nutrients to help your muscles function. A healthy diet includes fruits and vegetables, lean protein, whole grains, and low-fat or nonfat dairy products. Managing pain, stiffness, and swelling  Try massaging, stretching, and relaxing the affected muscle. Do this for several minutes at a time.  If directed, put ice on areas that are sore or painful after a cramp. To do this: ? Put ice in a plastic bag. ? Place a towel between your skin and the bag. ? Leave the ice on for 20 minutes, 2-3 times a day. ? Remove the ice if your skin turns bright red. This is very important. If you cannot feel pain, heat, or cold, you have a greater risk of damage to the  area.  If directed, apply heat to muscles that are tense or tight. Do this before you exercise, or as often as told by your health care provider. Use the heat source that your health care provider recommends, such as a moist heat pack or a heating pad. To do this: ? Place a towel between your skin and the heat source. ? Leave the heat on for 20-30 minutes. ? Remove the heat if your skin turns bright red. This is especially important if you are unable to feel pain, heat, or cold. You may have a greater risk of getting burned.  Try taking hot showers or baths to help relax tight muscles.      General instructions  If you are having frequent leg cramps, avoid intense exercise for several days.  Take over-the-counter and prescription medicines only as told by your health care provider.  Keep all follow-up visits. This is important. Contact a health care provider if:  Your leg cramps get more severe or more frequent, or they do not improve over time.  Your foot becomes cold, numb, or blue. Summary  Muscle cramps can develop in any muscle, but the most common place is in the calf muscles of the leg.  Leg cramps are painful, and they may last for a few seconds to a few minutes.  Usually, leg cramps are not caused by a serious medical problem. Often, the cause is not known.  Stay hydrated, and take over-the-counter and prescription medicines only as told by your  health care provider. This information is not intended to replace advice given to you by your health care provider. Make sure you discuss any questions you have with your health care provider. Document Revised: 04/09/2020 Document Reviewed: 04/09/2020 Elsevier Patient Education  2021 [place]. Dizziness Dizziness is a common problem. It makes you feel unsteady or light-headed. You may feel like you are about to pass out (faint). Dizziness can lead to getting hurt if you stumble or fall. Dizziness can be caused by many things,  including:  Medicines.  Not having enough water in your body (dehydration).  Illness. Follow these instructions at home: Eating and drinking  Drink enough fluid to keep your pee (urine) clear or pale yellow. This helps to keep you from getting dehydrated. Try to drink more clear fluids, such as water.  Do not drink alcohol.  Limit how much caffeine you drink or eat, if your doctor tells you to do that.  Limit how much salt (sodium) you drink or eat, if your doctor tells you to do that.   Activity  Avoid making quick movements. ? When you stand up from sitting in a chair, steady yourself until you feel okay. ? In the morning, first sit up on the side of the bed. When you feel okay, stand slowly while you hold onto something. Do this until you know that your balance is fine.  If you need to stand in one place for a long time, move your legs often. Tighten and relax the muscles in your legs while you are standing.  Do not drive or use heavy machinery if you feel dizzy.  Avoid bending down if you feel dizzy. Place items in your home so you can reach them easily without leaning over.   Lifestyle  Do not use any products that contain nicotine or tobacco, such as cigarettes and e-cigarettes. If you need help quitting, ask your doctor.  Try to lower your stress level. You can do this by using methods such as yoga or meditation. Talk with your doctor if you need help. General instructions  Watch your dizziness for any changes.  Take over-the-counter and prescription medicines only as told by your doctor. Talk with your doctor if you think that you are dizzy because of a medicine that you are taking.  Tell a friend or a family member that you are feeling dizzy. If he or she notices any changes in your behavior, have this person call your doctor.  Keep all follow-up visits as told by your doctor. This is important. Contact a doctor if:  Your dizziness does not go away.  Your  dizziness or light-headedness gets worse.  You feel sick to your stomach (nauseous).  You have trouble hearing.  You have new symptoms.  You are unsteady on your feet.  You feel like the room is spinning. Get help right away if:  You throw up (vomit) or have watery poop (diarrhea), and you cannot eat or drink anything.  You have trouble: ? Talking. ? Walking. ? Swallowing. ? Using your arms, hands, or legs.  You feel generally weak.  You are not thinking clearly, or you have trouble forming sentences. A friend or family member may notice this.  You have: ? Chest pain. ? Pain in your belly (abdomen). ? Shortness of breath. ? Sweating.  Your vision changes.  You are bleeding.  You have a very bad headache.  You have neck pain or a stiff neck.  You have a fever.  These symptoms may be an emergency. Do not wait to see if the symptoms will go away. Get medical help right away. Call your local emergency services (911 in the [place] Do not drive yourself to the hospital. Summary  Dizziness makes you feel unsteady or light-headed. You may feel like you are about to pass out (faint).  Drink enough fluid to keep your pee (urine) clear or pale yellow. Do not drink alcohol.  Avoid making quick movements if you feel dizzy.  Watch your dizziness for any changes. This information is not intended to replace advice given to you by your health care provider. Make sure you discuss any questions you have with your health care provider. Document Revised: 08/13/2020 Document Reviewed: 12/09/2016 Elsevier Patient Education  [place].

## 2021-03-30 NOTE — Assessment & Plan Note (Signed)
Patient reports that vertigo is resolved.  Symptoms improved after discontinuing ibuprofen. Advised patient to follow-up with no signs and symptoms of vertigo.

## 2021-03-30 NOTE — Progress Notes (Signed)
Established Patient Office Visit  Subjective:  Patient ID: [name], male    DOB: [birth year]  Age: 61 y.o. MRN: [id number]  CC:  Chief Complaint  Patient presents with  . Dizziness  . Leg Pain    HPI  [name] presents for follow up Vertigo - Dizziness:  The dizziness has been present for a few weeks. But has completely revolved.  The patient describes past  symptoms as vertigo and lightheadedness. Symptoms are exacerbated by bending and movements.The patient also complains of  Patient denies headaches, dizziness, nausea, ringing in the ear or hearing loss today in clinic.  He has been treated with meclizine (Antivert) with good improvement.   Pain  He reports chronic left foot pain. was an injury that may have caused the pain. The pain started 10 years ago and is staying constant. The pain does not radiate . The pain is described as aching, is moderate in intensity, occurring intermittently. Symptoms are worse in the: morning, mid-day, afternoon  Aggravating factors: walking Relieving factors: medication NSID.  He has tried NSAIDs with mild relief.   ---------------------------------------------------------------------------------------------------   Past Medical History:  Diagnosis Date  . Allergy   . Arthritis   . Calcaneus fracture, left   . Cancer [facility])    Skin cancer Left Arm  . GERD (gastroesophageal reflux disease)   . Small bowel obstruction [facility])     Past Surgical History:  Procedure Laterality Date  . FOOT SURGERY    . HAND SURGERY Right    contracture of hand  . INGUINAL HERNIA REPAIR Right 11/23/2013   Procedure: HERNIA REPAIR INGUINAL ADULT;  Surgeon: Scherry Ran, MD;  Location: [facility];  Service: General;  Laterality: Right;  site-inguinal area  . OPEN REDUCTION, INTERNAL FIXATION (ORIF) CALCANEAL FRACTURE WITH FUSION Left 11/30/2018   Procedure: OPEN REDUCTION, INTERNAL FIXATION (ORIF) CALCANEAL FRACTURE WITH PERONEAL DEBRIDEMENT &  REPAIR OF SUPERIOR PERONEAL RETINACULUM;  Surgeon: Erle Crocker, MD;  Location: [place];  Service: Orthopedics;  Laterality: Left;  Marland Kitchen VASECTOMY    . WRIST SURGERY Right    otif    Family History  Problem Relation Age of Onset  . Stroke Mother   . Cancer Mother        colon  . Alzheimer's disease Father        24 years  . Heart disease Father        atrial fibri  . Heart disease Sister   . Early death Brother   . Cancer Maternal Grandmother     Social History   Socioeconomic History  . Marital status: Married    Spouse name: Not on file  . Number of children: Not on file  . Years of education: Not on file  . Highest education level: Not on file  Occupational History  . Not on file  Tobacco Use  . Smoking status: Never Smoker  . Smokeless tobacco: Never Used  Vaping Use  . Vaping Use: Never used  Substance and Sexual Activity  . Alcohol use: Yes    Alcohol/week: 4.0 standard drinks    Types: 4 Cans of beer per week    Comment: occ  . Drug use: No  . Sexual activity: Yes    Birth control/protection: None    Comment: married 2005  Other Topics Concern  . Not on file  Social History Narrative  . Not on file   Social Determinants of Health   Financial Resource Strain: Not on  file  Food Insecurity: Not on file  Transportation Needs: Not on file  Physical Activity: Not on file  Stress: Not on file  Social Connections: Not on file  Intimate Partner Violence: Not on file    Outpatient Medications Prior to Visit  Medication Sig Dispense Refill  . acetaminophen (TYLENOL) 325 MG tablet Take 2 tablets (650 mg total) by mouth every 6 (six) hours as needed for mild pain, moderate pain, fever or headache.    . albuterol (PROAIR HFA) 108 (90 Base) MCG/ACT inhaler USE 2 PUFFS EVERY 6 HOURS AS NEEDED 8.5 g 3  . diclofenac (VOLTAREN) 75 MG EC tablet Take 1 tablet (75 mg total) by mouth 2 (two) times daily. 180 tablet 3  . gabapentin (NEURONTIN) 300 MG capsule Take 1  capsule (300 mg total) by mouth daily. 90 capsule 3  . pantoprazole (PROTONIX) 40 MG tablet Take 1 tablet (40 mg total) by mouth daily. 90 tablet 3  . rosuvastatin (CRESTOR) 10 MG tablet Take 1 tablet (10 mg total) by mouth daily. 90 tablet 3  . hydrOXYzine (ATARAX/VISTARIL) 10 MG tablet Take 1 tablet (10 mg total) by mouth 3 (three) times daily as needed. (Patient not taking: Reported on 01/16/2021) 30 tablet 0  . ibuprofen (ADVIL) 800 MG tablet Take 1 tablet (800 mg total) by mouth every 8 (eight) hours as needed. 30 tablet 5  . meclizine (ANTIVERT) 25 MG tablet Take 1 tablet (25 mg total) by mouth 3 (three) times daily as needed for dizziness. 30 tablet 0   No facility-administered medications prior to visit.    Allergies  Allergen Reactions  . Morphine And Related Swelling  . Bee Venom Rash    ROS Review of Systems  Constitutional: Negative.   HENT: Negative.   Eyes: Negative.   Respiratory: Negative.   Cardiovascular: Negative.   Musculoskeletal: Positive for gait problem.  Skin: Negative.   Neurological: Negative for light-headedness and headaches.  All other systems reviewed and are negative.     Objective:    Physical Exam Vitals reviewed.  Constitutional:      Appearance: Normal appearance.  HENT:     Head: Normocephalic.     Nose: Nose normal.  Eyes:     General: No visual field deficit.    Conjunctiva/sclera: Conjunctivae normal.  Cardiovascular:     Rate and Rhythm: Normal rate and regular rhythm.  Pulmonary:     Effort: Pulmonary effort is normal.     Breath sounds: Normal breath sounds.  Musculoskeletal:     Cervical back: Normal range of motion.     Right lower leg: Normal.     Left lower leg: Tenderness present. No swelling. No edema.     Left foot: Tenderness present. No swelling.  Neurological:     Mental Status: He is alert and oriented to person, place, and time.     Cranial Nerves: No facial asymmetry.     Motor: No weakness.     BP (!)  134/92   Pulse 74   Temp (!) 97.1 F (36.2 C) (Temporal)   Ht 5\' 5"  (1.651 m)   Wt 199 lb (90.3 kg)   SpO2 98%   BMI 33.12 kg/m  Wt Readings from Last 3 Encounters:  03/30/21 199 lb (90.3 kg)  03/16/21 197 lb (89.4 kg)  01/16/21 197 lb 12.8 oz (89.7 kg)     Health Maintenance Due  Topic Date Due  . COLONOSCOPY (Pts 45-60yrs Insurance coverage will need to be confirmed)  Never done    There are no preventive care reminders to display for this patient.  Lab Results  Component Value Date   TSH 1.130 08/13/2016   Lab Results  Component Value Date   WBC 8.4 01/16/2021   HGB 16.8 01/16/2021   HCT 48.3 01/16/2021   MCV 96 01/16/2021   PLT 274 01/16/2021   Lab Results  Component Value Date   NA 140 01/16/2021   K 4.0 01/16/2021   CO2 18 (L) 01/16/2021   GLUCOSE 96 01/16/2021   BUN 12 01/16/2021   CREATININE 1.04 01/16/2021   BILITOT 0.4 01/16/2021   ALKPHOS 84 01/16/2021   AST 32 01/16/2021   ALT 59 (H) 01/16/2021   PROT 6.8 01/16/2021   ALBUMIN 4.4 01/16/2021   CALCIUM 9.2 01/16/2021   ANIONGAP 10 11/23/2019   Lab Results  Component Value Date   CHOL 206 (H) 01/16/2021   Lab Results  Component Value Date   HDL 36 (L) 01/16/2021   Lab Results  Component Value Date   LDLCALC 142 (H) 01/16/2021   Lab Results  Component Value Date   TRIG 157 (H) 01/16/2021   Lab Results  Component Value Date   CHOLHDL 5.7 (H) 01/16/2021   Lab Results  Component Value Date   HGBA1C 6.0 (H) 11/19/2019      Assessment & Plan:  .p Problem List Items Addressed This Visit      Other   Vertigo - Primary    Patient reports that vertigo is resolved.  Symptoms improved after discontinuing ibuprofen. Advised patient to follow-up with no signs and symptoms of vertigo.          Left foot pain    This is a chronic problem for patient.  He reports falling 10 feet about 10 years ago and has consistently had foot pain.  Patient has already been to orthopedic and is on  prophylactic ibuprofen 800 mg tablet every 8 hours as needed.  Continue to provide education to patient to rest feet as he is on his feet 12 hours for work.  Wear comfortable shoes, elevate feet and soak feet in [place] salt, ice or warm compress as tolerated to provide pain/symptoms relief.    Follow-up with worsening unresolved symptoms.  Education provided with printed handouts given.         No orders of the defined types were placed in this encounter.   Follow-up: Return if symptoms worsen or fail to improve.    Ivy Lynn, NP

## 2021-04-09 DIAGNOSIS — J40 Bronchitis, not specified as acute or chronic: Secondary | ICD-10-CM | POA: Diagnosis not present

## 2021-04-09 DIAGNOSIS — M542 Cervicalgia: Secondary | ICD-10-CM | POA: Diagnosis not present

## 2021-04-09 DIAGNOSIS — G43009 Migraine without aura, not intractable, without status migrainosus: Secondary | ICD-10-CM | POA: Diagnosis not present

## 2021-04-24 ENCOUNTER — Other Ambulatory Visit: Payer: Self-pay

## 2021-04-24 ENCOUNTER — Encounter (HOSPITAL_COMMUNITY): Payer: Self-pay

## 2021-04-24 ENCOUNTER — Emergency Department (HOSPITAL_COMMUNITY)
Admission: EM | Admit: 2021-04-24 | Discharge: 2021-04-25 | Disposition: A | Discharge status: Home / Self Care | Payer: BC Managed Care – PPO | Attending: Emergency Medicine | Admitting: Emergency Medicine

## 2021-04-24 DIAGNOSIS — R531 Weakness: Secondary | ICD-10-CM | POA: Diagnosis not present

## 2021-04-24 DIAGNOSIS — M25552 Pain in left hip: Secondary | ICD-10-CM | POA: Diagnosis not present

## 2021-04-24 DIAGNOSIS — Z85828 Personal history of other malignant neoplasm of skin: Secondary | ICD-10-CM | POA: Diagnosis not present

## 2021-04-24 DIAGNOSIS — E860 Dehydration: Secondary | ICD-10-CM | POA: Diagnosis not present

## 2021-04-24 NOTE — ED Triage Notes (Signed)
Pt arrived via POV c/o generalized weakness et lack of energy r/t working many hours in the heat. Pt states that he has drunk a lot of water, but has been unable to quench the thirst.

## 2021-04-25 ENCOUNTER — Emergency Department (HOSPITAL_COMMUNITY): Payer: BC Managed Care – PPO

## 2021-04-25 DIAGNOSIS — M25552 Pain in left hip: Secondary | ICD-10-CM | POA: Diagnosis not present

## 2021-04-25 LAB — COMPREHENSIVE METABOLIC PANEL
ALT: 29 U/L (ref 0–44)
AST: 24 U/L (ref 15–41)
Albumin: 4 g/dL (ref 3.5–5.0)
Alkaline Phosphatase: 70 U/L (ref 38–126)
Anion gap: 9 (ref 5–15)
BUN: 13 mg/dL (ref 6–20)
CO2: 26 mmol/L (ref 22–32)
Calcium: 8.7 mg/dL — ABNORMAL LOW (ref 8.9–10.3)
Chloride: 103 mmol/L (ref 98–111)
Creatinine, Ser: 0.94 mg/dL (ref 0.61–1.24)
GFR, Estimated: >60 mL/min (ref >60)
Glucose, Bld: 108 mg/dL — ABNORMAL HIGH (ref 70–99)
Potassium: 3.7 mmol/L (ref 3.5–5.1)
Sodium: 138 mmol/L (ref 135–145)
Total Bilirubin: 0.6 mg/dL (ref 0.3–1.2)
Total Protein: 7.3 g/dL (ref 6.5–8.1)

## 2021-04-25 LAB — CBC WITH DIFFERENTIAL/PLATELET
Abs Immature Granulocytes: 0.04 10*3/uL (ref 0.00–0.07)
Basophils Absolute: 0 10*3/uL (ref 0.0–0.1)
Basophils Relative: 0 %
Eosinophils Absolute: 0 10*3/uL (ref 0.0–0.5)
Eosinophils Relative: 0 %
HCT: 45.8 % (ref 39.0–52.0)
Hemoglobin: 15.5 g/dL (ref 13.0–17.0)
Immature Granulocytes: 0 %
Lymphocytes Relative: 21 %
Lymphs Abs: 2.3 10*3/uL (ref 0.7–4.0)
MCH: 33.5 pg (ref 26.0–34.0)
MCHC: 33.8 g/dL (ref 30.0–36.0)
MCV: 99.1 fL (ref 80.0–100.0)
Monocytes Absolute: 0.9 10*3/uL (ref 0.1–1.0)
Monocytes Relative: 8 %
Neutro Abs: 7.9 10*3/uL — ABNORMAL HIGH (ref 1.7–7.7)
Neutrophils Relative %: 71 %
Platelets: 209 10*3/uL (ref 150–400)
RBC: 4.62 MIL/uL (ref 4.22–5.81)
RDW: 12.4 % (ref 11.5–15.5)
WBC: 11.2 10*3/uL — ABNORMAL HIGH (ref 4.0–10.5)
nRBC: 0 % (ref 0.0–0.2)

## 2021-04-25 LAB — URINALYSIS, ROUTINE W REFLEX MICROSCOPIC
Bilirubin Urine: NEGATIVE
Glucose, UA: NEGATIVE mg/dL
Hgb urine dipstick: NEGATIVE
Ketones, ur: NEGATIVE mg/dL
Leukocytes,Ua: NEGATIVE
Nitrite: NEGATIVE
Protein, ur: NEGATIVE mg/dL
Specific Gravity, Urine: 1.012 (ref 1.005–1.030)
pH: 6 (ref 5.0–8.0)

## 2021-04-25 LAB — CBG MONITORING, ED: Glucose-Capillary: 157 mg/dL — ABNORMAL HIGH (ref 70–99)

## 2021-04-25 LAB — CK: Total CK: 85 U/L (ref 49–397)

## 2021-04-25 MED ORDER — LACTATED RINGERS IV BOLUS
1000.0000 mL | Freq: Once | INTRAVENOUS | Status: AC
  Administered 2021-04-25: 1000 mL via INTRAVENOUS

## 2021-04-25 NOTE — ED Notes (Signed)
Pt ambulated around nurses station without difficulty. Pt states the generalized weakness has subsided. Pt states L hip pain continues to shoot down LLE rated 7 on pain scale.

## 2021-04-25 NOTE — ED Notes (Signed)
Pt returned from X-ray.  

## 2021-04-25 NOTE — ED Notes (Signed)
Patient transported to X-ray 

## 2021-04-25 NOTE — ED Provider Notes (Signed)
[facility] EMERGENCY DEPARTMENT Provider Note   CSN: [id number] Arrival date & time: 04/24/21  2314     History Chief Complaint  Patient presents with  . Weakness    Generalized weakness     [name] is a 60 y.o. male.  The history is provided by the patient.  Weakness He has history of GERD and comes and complaining of excessive thirst and generalized weakness which started yesterday.  Yesterday, he had been working in the heat, and felt like he was getting dehydrated.  He drank a lot of water but his mouth continued to be dry.  He felt generally weak.  He denies fever and denies nausea or vomiting.  Urine output has been slightly decreased compared with baseline.  Today, he basically laid in bed most of the day but went into work and the air conditioning was out and he noted that he was continuing to feel weak and continuing to have dry mouth.  He denies arthralgias or myalgias but is complaining of pain in his left hip which is worse when he walks.   Past Medical History:  Diagnosis Date  . Allergy   . Arthritis   . Calcaneus fracture, left   . Cancer [facility])    Skin cancer Left Arm  . GERD (gastroesophageal reflux disease)   . Small bowel obstruction [facility])     Patient Active Problem List   Diagnosis Date Noted  . Left foot pain 03/30/2021  . Vertigo 03/16/2021  . 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 Numbness and tingling in right hand 10/27/2016  . Esophageal reflux 10/29/2014  . Primary osteoarthritis of right knee 10/22/2014  . Inguinal hernia 11/23/2013    Past Surgical History:  Procedure Laterality Date  . FOOT SURGERY    . HAND SURGERY Right    contracture of hand  . INGUINAL HERNIA REPAIR Right 11/23/2013   Procedure: HERNIA REPAIR INGUINAL ADULT;  Surgeon: Scherry Ran, MD;  Location: [facility];  Service: General;  Laterality: Right;  site-inguinal area  . OPEN REDUCTION, INTERNAL FIXATION (ORIF) CALCANEAL FRACTURE WITH FUSION Left  11/30/2018   Procedure: OPEN REDUCTION, INTERNAL FIXATION (ORIF) CALCANEAL FRACTURE WITH PERONEAL DEBRIDEMENT & REPAIR OF SUPERIOR PERONEAL RETINACULUM;  Surgeon: Erle Crocker, MD;  Location: [place];  Service: Orthopedics;  Laterality: Left;  Marland Kitchen VASECTOMY    . WRIST SURGERY Right    otif       Family History  Problem Relation Age of Onset  . Stroke Mother   . Cancer Mother        colon  . Alzheimer's disease Father        47 years  . Heart disease Father        atrial fibri  . Heart disease Sister   . Early death Brother   . Cancer Maternal Grandmother     Social History   Tobacco Use  . Smoking status: Never Smoker  . Smokeless tobacco: Never Used  Vaping Use  . Vaping Use: Never used  Substance Use Topics  . Alcohol use: Yes    Alcohol/week: 4.0 standard drinks    Types: 4 Cans of beer per week    Comment: occ  . Drug use: No    Home Medications Prior to Admission medications   Medication Sig Start Date End Date Taking? Authorizing Provider  acetaminophen (TYLENOL) 325 MG tablet Take 2 tablets (650 mg total) by mouth every 6 (six) hours as needed for mild pain,  moderate pain, fever or headache. 11/23/19   Cherene Altes, MD  albuterol [facility] HFA) 108 (90 Base) MCG/ACT inhaler USE 2 PUFFS EVERY 6 HOURS AS NEEDED 12/22/20   Dettinger, [name], MD  diclofenac (VOLTAREN) 75 MG EC tablet Take 1 tablet (75 mg total) by mouth 2 (two) times daily. 12/22/20   Dettinger, [name], MD  gabapentin (NEURONTIN) 300 MG capsule Take 1 capsule (300 mg total) by mouth daily. 12/22/20   Dettinger, [name], MD  pantoprazole (PROTONIX) 40 MG tablet Take 1 tablet (40 mg total) by mouth daily. 12/22/20   Dettinger, [name], MD  rosuvastatin (CRESTOR) 10 MG tablet Take 1 tablet (10 mg total) by mouth daily. 01/19/21   Gwenlyn Perking, FNP    Allergies    Ibuprofen, Morphine and related, and Bee venom  Review of Systems   Review of Systems  Neurological: Positive for  weakness.  All other systems reviewed and are negative.   Physical Exam Updated Vital Signs BP (!) 151/95 (BP Location: Right Arm)   Pulse 85   Temp 98.5 F (36.9 C)   Resp 16   Ht 5\' 5"  (1.651 m)   Wt 89.8 kg   SpO2 96%   BMI 32.95 kg/m   Physical Exam Vitals and nursing note reviewed.   60 year old male, resting comfortably and in no acute distress. Vital signs are significant for elevated blood pressure. Oxygen saturation is 96%, which is normal. Head is normocephalic and atraumatic. PERRLA, EOMI. Oropharynx is clear. Neck is nontender and supple without adenopathy or JVD. Back is nontender and there is no CVA tenderness. Lungs are clear without rales, wheezes, or rhonchi. Chest is nontender. Heart has regular rate and rhythm without murmur. Abdomen is soft, flat, nontender without masses or hepatosplenomegaly and peristalsis is normoactive. Extremities have no cyanosis or edema, full range of motion is present.  There is mild pain on range of motion of the left hip, but there is no tenderness to palpation. Skin is warm and dry without rash. Neurologic: Mental status is normal, cranial nerves are intact, there are no motor or sensory deficits.  ED Results / Procedures / Treatments   Labs (all labs ordered are listed, but only abnormal results are displayed) Labs Reviewed  CBC WITH DIFFERENTIAL/PLATELET - Abnormal; Notable for the following components:      Result Value   WBC 11.2 (*)    Neutro Abs 7.9 (*)    All other components within normal limits  COMPREHENSIVE METABOLIC PANEL - Abnormal; Notable for the following components:   Glucose, Bld 108 (*)    Calcium 8.7 (*)    All other components within normal limits  CBG MONITORING, ED - Abnormal; Notable for the following components:   Glucose-Capillary 157 (*)    All other components within normal limits  URINALYSIS, ROUTINE W REFLEX MICROSCOPIC  CK   Procedures Procedures   Medications Ordered in  ED Medications  lactated ringers bolus 1,000 mL (0 mLs Intravenous Stopped 04/25/21 0234)    ED Course  I have reviewed the triage vital signs and the nursing notes.  Pertinent labs & imaging results that were available during my care of the patient were reviewed by me and considered in my medical decision making (see chart for details).   MDM Rules/Calculators/A&P                         Thirst and weakness, etiology is unclear.  He does not appear overtly dehydrated on exam.  Glucose is slightly elevated at 157, but this is not high enough to account for his symptoms.  Will check screening labs and urinalysis.  Old records were reviewed, and he has no relevant past visits.  Labs are unremarkable and urinalysis is normal.  He feels better following IV hydration, but there was no evidence of dehydration on his labs.  He he was ambulated and was able to walk without difficulty.  He is felt to be safe for discharge.  Recommended continue oral hydration, return if symptoms worsen.  Final Clinical Impression(s) / ED Diagnoses Final diagnoses:  Weakness    Rx / DC Orders ED Discharge Orders    None       Delora Fuel, MD 56/81/27 [phone]

## 2021-04-25 NOTE — Discharge Instructions (Addendum)
Your evaluation today did not show any evidence of dehydration.  Continue to drink plenty of fluids.  Return if your symptoms are getting worse.

## 2021-04-29 DIAGNOSIS — J029 Acute pharyngitis, unspecified: Secondary | ICD-10-CM | POA: Diagnosis not present

## 2021-04-29 DIAGNOSIS — R6883 Chills (without fever): Secondary | ICD-10-CM | POA: Diagnosis not present

## 2021-04-29 DIAGNOSIS — J329 Chronic sinusitis, unspecified: Secondary | ICD-10-CM | POA: Diagnosis not present

## 2021-04-29 DIAGNOSIS — R059 Cough, unspecified: Secondary | ICD-10-CM | POA: Diagnosis not present

## 2021-05-05 ENCOUNTER — Encounter: Payer: Self-pay | Admitting: Orthopaedic Surgery

## 2021-05-05 ENCOUNTER — Ambulatory Visit: Payer: BC Managed Care – PPO | Admitting: Orthopaedic Surgery

## 2021-05-05 ENCOUNTER — Other Ambulatory Visit: Payer: Self-pay

## 2021-05-05 VITALS — BP 133/93 | HR 84 | Ht 65.0 in | Wt 193.5 lb

## 2021-05-05 DIAGNOSIS — S76112A Strain of left quadriceps muscle, fascia and tendon, initial encounter: Secondary | ICD-10-CM | POA: Diagnosis not present

## 2021-05-05 NOTE — Progress Notes (Signed)
  I hurt my left thigh about two weeks ago.  He was carrying a backpack of weed killer, four gallons or so, spraying on a hill.  He turned and then felt a searing pain of the left hip and proximal thigh anteriorly.  He could hardly stand or move.  He got out of the backpack and was able to limp to the top of the hill and sit down.  He did not fall.  The following day on 04-24-21 he went to the ER as he had not improved.  He was examined and X-rays done of the hip which showed mild osteoarthritis of the left hip but no fracture.  He was told it should be better in a few days.  It has not gotten better.  He is limited in walking and doing any steps.  He has no swelling, no redness and no numbness.    He has ibuprofen 800 and has taken a few which help.  He has no other injury.  He limps to the left.  Exam shows pain of the proximal hamstring centrally near the hip. ROM of the hip is full but tender.  There is no defect or redness.  NV intact.  Knee is OK and stable.  I have reviewed the ER record.  I have independently reviewed and interpreted x-rays of this patient done at another site by another physician or qualified health professional.  Encounter Diagnosis  Name Primary?  . Quadriceps strain, left, initial encounter Yes   I have explained the findings to him.  This will take several more weeks to resolve.  I have gone over precautions.  He should take the ibuprofen tid after eating.  Use ice.  Return in two weeks.  If better, he can call and cancel.  If worse, call.  Electronically Signed Sanjuana Kava, MD 5/31/20224:12 PM

## 2021-05-19 ENCOUNTER — Ambulatory Visit: Payer: BC Managed Care – PPO | Admitting: Orthopaedic Surgery

## 2021-05-29 ENCOUNTER — Ambulatory Visit: Payer: BC Managed Care – PPO | Admitting: Family Medicine

## 2021-05-29 ENCOUNTER — Other Ambulatory Visit: Payer: Self-pay

## 2021-05-29 ENCOUNTER — Encounter: Payer: Self-pay | Admitting: Nurse Practitioner

## 2021-05-29 ENCOUNTER — Ambulatory Visit: Payer: BC Managed Care – PPO | Admitting: Nurse Practitioner

## 2021-05-29 VITALS — BP 133/85 | HR 94 | Temp 98.2°F | Ht 65.0 in | Wt 193.0 lb

## 2021-05-29 DIAGNOSIS — M25552 Pain in left hip: Secondary | ICD-10-CM | POA: Insufficient documentation

## 2021-05-29 MED ORDER — PREDNISONE 10 MG (21) PO TBPK: ORAL_TABLET | ORAL | 0 refills | Status: DC

## 2021-05-29 MED ORDER — METHOCARBAMOL 500 MG PO TABS: 500.0000 mg | ORAL_TABLET | Freq: Three times a day (TID) | ORAL | 0 refills | Status: DC

## 2021-05-29 MED ORDER — METHYLPREDNISOLONE ACETATE 40 MG/ML IJ SUSP
80.0000 mg | Freq: Once | INTRAMUSCULAR | Status: AC
  Administered 2021-05-29: 80 mg via INTRAMUSCULAR

## 2021-05-29 NOTE — Patient Instructions (Signed)
Hip Pain The hip is the joint between the upper legs and the lower pelvis. The bones, cartilage, tendons, and muscles of your hip joint support your body and allow you to move around. Hip pain can range from a minor ache to severe pain in one or both of your hips. The pain may be felt on the inside of the hip joint near the groin, or on the outside near the buttocks and upper thigh. You may also have swelling or stiffness in your hip area. Follow these instructions at home: Managing pain, stiffness, and swelling   If directed, put ice on the painful area. To do this: Put ice in a plastic bag. Place a towel between your skin and the bag. Leave the ice on for 20 minutes, 2-3 times a day. If directed, apply heat to the affected area as often as told by your health care provider. Use the heat source that your health care provider recommends, such as a moist heat pack or a heating pad. Place a towel between your skin and the heat source. Leave the heat on for 20-30 minutes. Remove the heat if your skin turns bright red. This is especially important if you are unable to feel pain, heat, or cold. You may have a greater risk of getting burned. Activity Do exercises as told by your health care provider. Avoid activities that cause pain. General instructions  Take over-the-counter and prescription medicines only as told by your health care provider. Keep a journal of your symptoms. Write down: How often you have hip pain. The location of your pain. What the pain feels like. What makes the pain worse. Sleep with a pillow between your legs on your most comfortable side. Keep all follow-up visits as told by your health care provider. This is important. Contact a health care provider if: You cannot put weight on your leg. Your pain or swelling continues or gets worse after one week. It gets harder to walk. You have a fever. Get help right away if: You fall. You have a sudden increase in pain and  swelling in your hip. Your hip is red or swollen or very tender to touch. Summary Hip pain can range from a minor ache to severe pain in one or both of your hips. The pain may be felt on the inside of the hip joint near the groin, or on the outside near the buttocks and upper thigh. Avoid activities that cause pain. Write down how often you have hip pain, the location of the pain, what makes it worse, and what it feels like. This information is not intended to replace advice given to you by your health care provider. Make sure you discuss any questions you have with your health care provider. Document Revised: 04/09/2019 Document Reviewed: 04/09/2019 Elsevier Patient Education  2022 [employer] Inc.  

## 2021-05-29 NOTE — Assessment & Plan Note (Signed)
Recurrent left hip pain, symptoms not well controlled.  prednisone taper, continue ibuprofen, depo medrol 80 shot given in office. Flexeril muscle relaxant.   Education provided to patient with printed handout given. RX sent to pharmacy.   Follow up with worsening or unresolved symptoms.

## 2021-05-29 NOTE — Progress Notes (Signed)
Acute Office Visit  Subjective:    Patient ID: [name], male    DOB: [birth year], 60 y.o.   MRN: [id number]  Chief Complaint  Patient presents with   Leg Pain    HPI Patient is in today for Pain  He reports recurrent left hip pain. was not an injury that may have caused the pain. The pain started a few weeks ago and is staying constant. The pain does not radiate . The pain is described as aching, is moderate in intensity, occurring constantly. Symptoms are worse in the: mid-day, afternoon, evening  Aggravating factors: walking Relieving factors: none.  He has tried application of ice with no relief.   ---------------------------------------------------------------------------------------------------   Past Medical History:  Diagnosis Date   Allergy    Arthritis    Calcaneus fracture, left    Cancer (HCC)    Skin cancer Left Arm   GERD (gastroesophageal reflux disease)    Small bowel obstruction (HCC)     Past Surgical History:  Procedure Laterality Date   FOOT SURGERY     HAND SURGERY Right    contracture of hand   INGUINAL HERNIA REPAIR Right 11/23/2013   Procedure: HERNIA REPAIR INGUINAL ADULT;  Surgeon: Scherry Ran, MD;  Location: [facility];  Service: General;  Laterality: Right;  site-inguinal area   OPEN REDUCTION, INTERNAL FIXATION (ORIF) CALCANEAL FRACTURE WITH FUSION Left 11/30/2018   Procedure: OPEN REDUCTION, INTERNAL FIXATION (ORIF) CALCANEAL FRACTURE WITH PERONEAL DEBRIDEMENT & REPAIR OF SUPERIOR PERONEAL RETINACULUM;  Surgeon: Erle Crocker, MD;  Location: [place];  Service: Orthopedics;  Laterality: Left;   VASECTOMY     WRIST SURGERY Right    otif    Family History  Problem Relation Age of Onset   Stroke Mother    Cancer Mother        colon   Alzheimer's disease Father        62 years   Heart disease Father        atrial fibri   Heart disease Sister    Early death Brother    Cancer Maternal Grandmother     Social History    Socioeconomic History   Marital status: Married    Spouse name: Not on file   Number of children: Not on file   Years of education: Not on file   Highest education level: Not on file  Occupational History   Not on file  Tobacco Use   Smoking status: Never   Smokeless tobacco: Never  Vaping Use   Vaping Use: Never used  Substance and Sexual Activity   Alcohol use: Yes    Alcohol/week: 4.0 standard drinks    Types: 4 Cans of beer per week    Comment: occ   Drug use: No   Sexual activity: Yes    Birth control/protection: None    Comment: married 2005  Other Topics Concern   Not on file  Social History Narrative   Not on file   Social Determinants of Health   Financial Resource Strain: Not on file  Food Insecurity: Not on file  Transportation Needs: Not on file  Physical Activity: Not on file  Stress: Not on file  Social Connections: Not on file  Intimate Partner Violence: Not on file    Outpatient Medications Prior to Visit  Medication Sig Dispense Refill   acetaminophen (TYLENOL) 325 MG tablet Take 2 tablets (650 mg total) by mouth every 6 (six) hours as needed for mild pain,  moderate pain, fever or headache.     gabapentin (NEURONTIN) 300 MG capsule Take 1 capsule (300 mg total) by mouth daily. 90 capsule 3   pantoprazole (PROTONIX) 40 MG tablet Take 1 tablet (40 mg total) by mouth daily. 90 tablet 3   albuterol (PROAIR HFA) 108 (90 Base) MCG/ACT inhaler USE 2 PUFFS EVERY 6 HOURS AS NEEDED (Patient not taking: Reported on 05/29/2021) 8.5 g 3   diclofenac (VOLTAREN) 75 MG EC tablet Take 1 tablet (75 mg total) by mouth 2 (two) times daily. (Patient not taking: Reported on 05/29/2021) 180 tablet 3   rosuvastatin (CRESTOR) 10 MG tablet Take 1 tablet (10 mg total) by mouth daily. (Patient not taking: Reported on 05/29/2021) 90 tablet 3   No facility-administered medications prior to visit.    Allergies  Allergen Reactions   Ibuprofen Swelling   Morphine And Related  Swelling   Bee Venom Rash    Review of Systems  Constitutional: Negative.   HENT: Negative.    Respiratory: Negative.    Cardiovascular: Negative.   Genitourinary: Negative.   Musculoskeletal: Negative.   Skin: Negative.   Psychiatric/Behavioral: Negative.    All other systems reviewed and are negative.     Objective:    Physical Exam Vitals and nursing note reviewed.  Constitutional:      Appearance: Normal appearance.  HENT:     Head: Normocephalic.     Mouth/Throat:     Mouth: Mucous membranes are moist.     Pharynx: Oropharynx is clear.  Eyes:     Conjunctiva/sclera: Conjunctivae normal.  Cardiovascular:     Rate and Rhythm: Normal rate and regular rhythm.     Pulses: Normal pulses.     Heart sounds: Normal heart sounds.  Pulmonary:     Effort: Pulmonary effort is normal.     Breath sounds: Normal breath sounds.  Abdominal:     General: Bowel sounds are normal.  Musculoskeletal:     Left hip: Tenderness present. Decreased range of motion.  Skin:    Findings: No rash.  Neurological:     Mental Status: He is alert and oriented to person, place, and time.  Psychiatric:        Behavior: Behavior normal.    BP 133/85   Pulse 94   Temp 98.2 F (36.8 C) (Temporal)   Ht 5\' 5"  (1.651 m)   Wt 193 lb (87.5 kg)   SpO2 97%   BMI 32.12 kg/m  Wt Readings from Last 3 Encounters:  05/29/21 193 lb (87.5 kg)  05/05/21 193 lb 8 oz (87.8 kg)  04/24/21 198 lb (89.8 kg)    Health Maintenance Due  Topic Date Due   Pneumococcal Vaccine 57-41 Years old (1 - PCV) Never done   Zoster Vaccines- Shingrix (1 of 2) Never done   COLONOSCOPY (Pts 45-11yrs Insurance coverage will need to be confirmed)  Never done    There are no preventive care reminders to display for this patient.   Lab Results  Component Value Date   TSH 1.130 08/13/2016   Lab Results  Component Value Date   WBC 11.2 (H) 04/25/2021   HGB 15.5 04/25/2021   HCT 45.8 04/25/2021   MCV 99.1 04/25/2021    PLT 209 04/25/2021   Lab Results  Component Value Date   NA 138 04/25/2021   K 3.7 04/25/2021   CO2 26 04/25/2021   GLUCOSE 108 (H) 04/25/2021   BUN 13 04/25/2021   CREATININE 0.94 04/25/2021  BILITOT 0.6 04/25/2021   ALKPHOS 70 04/25/2021   AST 24 04/25/2021   ALT 29 04/25/2021   PROT 7.3 04/25/2021   ALBUMIN 4.0 04/25/2021   CALCIUM 8.7 (L) 04/25/2021   ANIONGAP 9 04/25/2021   Lab Results  Component Value Date   CHOL 206 (H) 01/16/2021   Lab Results  Component Value Date   HDL 36 (L) 01/16/2021   Lab Results  Component Value Date   LDLCALC 142 (H) 01/16/2021   Lab Results  Component Value Date   TRIG 157 (H) 01/16/2021   Lab Results  Component Value Date   CHOLHDL 5.7 (H) 01/16/2021   Lab Results  Component Value Date   HGBA1C 6.0 (H) 11/19/2019       Assessment & Plan:   Problem List Items Addressed This Visit       Other   Left hip pain - Primary    Recurrent left hip pain, symptoms not well controlled.  prednisone taper, continue ibuprofen, depo medrol 80 shot given in office. Flexeril muscle relaxant.   Education provided to patient with printed handout given. RX sent to pharmacy.   Follow up with worsening or unresolved symptoms.       Relevant Medications   predniSONE (STERAPRED UNI-PAK 21 TAB) 10 MG (21) TBPK tablet   methocarbamol (ROBAXIN) 500 MG tablet     Meds ordered this encounter  Medications   predniSONE (STERAPRED UNI-PAK 21 TAB) 10 MG (21) TBPK tablet    Sig: 6 tablet day 1,5 tablet day 2, 4 tablet day 3, 3 tablet day 4, 2 tablet day 5, 1 tablet day 6    Dispense:  1 each    Refill:  0    Order Specific Question:   Supervising Provider    Answer:   Janora Norlander [[id number]]   methocarbamol (ROBAXIN) 500 MG tablet    Sig: Take 1 tablet (500 mg total) by mouth 3 (three) times daily.    Dispense:  30 tablet    Refill:  0    Order Specific Question:   Supervising Provider    Answer:   Janora Norlander [[id number]]    methylPREDNISolone acetate (DEPO-MEDROL) injection 80 mg     Ivy Lynn, NP

## 2021-06-17 ENCOUNTER — Other Ambulatory Visit: Payer: Self-pay

## 2021-06-17 ENCOUNTER — Encounter: Payer: Self-pay | Admitting: Family Medicine

## 2021-06-17 ENCOUNTER — Ambulatory Visit: Payer: BC Managed Care – PPO | Admitting: Family Medicine

## 2021-06-17 VITALS — BP 120/81 | HR 78 | Ht 65.0 in | Wt 197.0 lb

## 2021-06-17 DIAGNOSIS — E782 Mixed hyperlipidemia: Secondary | ICD-10-CM | POA: Diagnosis not present

## 2021-06-17 DIAGNOSIS — M79672 Pain in left foot: Secondary | ICD-10-CM | POA: Diagnosis not present

## 2021-06-17 DIAGNOSIS — K219 Gastro-esophageal reflux disease without esophagitis: Secondary | ICD-10-CM

## 2021-06-17 NOTE — Progress Notes (Signed)
C.C: left foot pain, hyperlipidemia f/u, general questions  Left foot pain: Had a fall 2 year ago. Fell from 38ft ladder and shattered heel - had surgery. The heels still really bothers him day to day especially when he is on his feet a lot. Works 12 hour shifts and on feet the whole time. This makes his heel pain worse. Takes ibuprofen 800 mg in the morning before work if foot is hurting and then again 8 hours into workday. Also takes aleve at night after work if pain is really bad. This helps the pain. He denies any dark stools or stomach pain when taking ibuprofen. He has FMLA for work can be out 1 day a month for pain, would like to change to as need if possible.   F/u for hyperlipidemia: Started Crestor 6 months ago. Stopped taking after two weeks. He had trouble concentrating and felt somewhat sluggish. He also mentioned that he was taking zoloft and does not know if those side effects were from Crestor or zoloft - but he stopped both. He has made some changes in diet like cutting back on soda and red meat.   Anxiety:  Started on zoloft in October 2021. He said that this did seem to help his temper but he had difficulty concentrating with it so he stopped taking it.   He also had questions about treatment for osteoarthritis. Advised that tylenol or ibuprofen can help the pain and that exercise helps. Can try exercises at home and might start going to the gym again if he can.   He also mentioned in the last 2 weeks he has been dropping things like forks when holding them in his right hand but doesn't feel it slip from his fingers. He is not experiencing any pain in the hand and doesn't report any tingling, numbness, or loss of sensation. He did break his wrist a few years ago and had to have surgery with a rod placed. 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   ROS: General:  No weight loss, normal appetite Pulmonary: no cough CV: no chest  pain Gastrointestinal: no changes in bowel or bladder function   Physical exam: Vita signs: BP:148/99 (1st), recheck: 120/81 HR: 78 SpO2: 96%RA General: appears comfortable, conversant, normal work of breathing CV: regular rate and rhythm with normal S1-S2; no murmurs Lungs: clear to auscultation bilaterally, no wheezes or crackles MSK: palpable nodule on right hand below MCP joint of the 5th digit. Surgical scar on right wrist from previous surgery. No pain in hand Neurological exam: normal sensation in right hand   Assessment/Plan:   Left heel pain: continue taking 800mg  of ibuprofen twice a day on work days. Make sure to wear supportive shoes and replace custom heel inserts when worn out. Completed paperwork for patient's work FMLA - patient should be able to take off work when pain is unbearable.  Hyperlipidemia: Will check labs for cholesterol levels. Continue to work on dietary changes. If cholesterol is high, will discuss options for other medications that patient can tolerate.  Right hand grip issue: likely a symptom of Dupuytren's contracture. No tingling, numbness, or loss of sensation in the hand to suggest nerve damage. Patient has had surgery for Dupuytren's contracture before. Advise patient to make an appointment with his hand surgeon to discuss additional surgical options.

## 2021-06-17 NOTE — Progress Notes (Signed)
BP 120/81   Pulse 78   Ht 5\' 5"  (1.651 m)   Wt 197 lb (89.4 kg)   SpO2 96%   BMI 32.78 kg/m    Subjective:   Patient ID: [name], male    DOB: [birth year], 60 y.o.   MRN: [id number]  HPI: [name] is a 60 y.o. male presenting on 06/17/2021 for Medical Management of Chronic Issues, Gastroesophageal Reflux, and Hyperlipidemia   HPI Foot pain Patient is having left foot pain since his fracture 2 years ago.  He takes ibuprofen twice a day and it does seem to help.  It is worse at work and he does have been in for [facility] and put in place for 1 day a month and wants to go up to 2 days a month that he can take off when he gets issues.  He said is it is worse when he is on his feet for prolonged periods but otherwise no major issues.  He is happy with continue with ibuprofen and he does have inserts and will continue to use those  Hyperlipidemia Patient is coming in for recheck of his hyperlipidemia. The patient is currently taking no medication currently, has been trying diet and exercise, had prescribed Crestor and he took for couple weeks but then stopped it.  He did not know if it or another medicine was causing some issues.. They deny any issues with myalgias or history of liver damage from it. They deny any focal numbness or weakness or chest pain.   GERD Patient is currently on pantoprazole.  She denies any major symptoms or abdominal pain or belching or burping. She denies any blood in her stool or lightheadedness or dizziness.   Relevant past medical, surgical, family and social history reviewed and updated as indicated. Interim medical history since our last visit reviewed. Allergies and medications reviewed and updated.  Review of Systems  Constitutional:  Negative for chills and fever.  Respiratory:  Negative for shortness of breath and wheezing.   Cardiovascular:  Negative for chest pain and leg swelling.  Musculoskeletal:  Positive for arthralgias. Negative for  back pain.  Skin:  Negative for rash.  Neurological:  Negative for dizziness and light-headedness.  All other systems reviewed and are negative.  Per HPI unless specifically indicated above   Allergies as of 06/17/2021       Reactions   Morphine And Related Swelling   Bee Venom Rash        Medication List        Accurate as of June 17, 2021 11:31 AM. If you have any questions, ask your nurse or doctor.          STOP taking these medications    diclofenac 75 MG EC tablet Commonly known as: VOLTAREN Stopped by: Worthy Rancher, MD       TAKE these medications    acetaminophen 325 MG tablet Commonly known as: TYLENOL Take 2 tablets (650 mg total) by mouth every 6 (six) hours as needed for mild pain, moderate pain, fever or headache.   albuterol 108 (90 Base) MCG/ACT inhaler Commonly known as: ProAir HFA USE 2 PUFFS EVERY 6 HOURS AS NEEDED   gabapentin 300 MG capsule Commonly known as: NEURONTIN Take 1 capsule (300 mg total) by mouth daily.   methocarbamol 500 MG tablet Commonly known as: Robaxin Take 1 tablet (500 mg total) by mouth 3 (three) times daily.   pantoprazole 40 MG tablet Commonly known as: PROTONIX  Take 1 tablet (40 mg total) by mouth daily.   predniSONE 10 MG (21) Tbpk tablet Commonly known as: STERAPRED UNI-PAK 21 TAB 6 tablet day 1,5 tablet day 2, 4 tablet day 3, 3 tablet day 4, 2 tablet day 5, 1 tablet day 6   rosuvastatin 10 MG tablet Commonly known as: Crestor Take 1 tablet (10 mg total) by mouth daily.         Objective:   BP 120/81   Pulse 78   Ht 5\' 5"  (1.651 m)   Wt 197 lb (89.4 kg)   SpO2 96%   BMI 32.78 kg/m   Wt Readings from Last 3 Encounters:  06/17/21 197 lb (89.4 kg)  05/29/21 193 lb (87.5 kg)  05/05/21 193 lb 8 oz (87.8 kg)    Physical Exam Vitals and nursing note reviewed.  Constitutional:      General: He is not in acute distress.    Appearance: He is well-developed. He is not diaphoretic.  Eyes:      General: No scleral icterus.    Conjunctiva/sclera: Conjunctivae normal.  Neck:     Thyroid: No thyromegaly.  Cardiovascular:     Rate and Rhythm: Normal rate and regular rhythm.     Heart sounds: Normal heart sounds. No murmur heard. Pulmonary:     Effort: Pulmonary effort is normal. No respiratory distress.     Breath sounds: Normal breath sounds. No wheezing.  Musculoskeletal:     Cervical back: Neck supple.  Lymphadenopathy:     Cervical: No cervical adenopathy.  Skin:    General: Skin is warm and dry.     Findings: No rash.  Neurological:     Mental Status: He is alert and oriented to person, place, and time.     Coordination: Coordination normal.  Psychiatric:        Behavior: Behavior normal.      Assessment & Plan:   Problem List Items Addressed This Visit       Digestive   Esophageal reflux     Other   Mixed hyperlipidemia   Left foot pain - Primary    Increase FMLA to 2 times per month as needed for foot issues. Follow up plan: Return in about 6 months (around 12/18/2021), or if symptoms worsen or fail to improve, for hld and gerd recheck.  Counseling provided for all of the vaccine components No orders of the defined types were placed in this encounter.   Caryl Pina, MD [facility] 06/17/2021, 11:31 AM

## 2021-06-18 LAB — LIPID PANEL
Chol/HDL Ratio: 5.1 ratio — ABNORMAL HIGH (ref 0.0–5.0)
Cholesterol, Total: 218 mg/dL — ABNORMAL HIGH (ref 100–199)
HDL: 43 mg/dL (ref >39)
LDL Chol Calc (NIH): 150 mg/dL — ABNORMAL HIGH (ref 0–99)
Triglycerides: 139 mg/dL (ref 0–149)
VLDL Cholesterol Cal: 25 mg/dL (ref 5–40)

## 2021-06-25 ENCOUNTER — Telehealth: Payer: Self-pay | Admitting: Family Medicine

## 2021-06-25 NOTE — Telephone Encounter (Cosign Needed)
Spoke with [name] over the phone about his colorectal cancer screening. He had a colonoscopy at age 60. He is willing to have another referral, but wants to wait until his next appointment to send referral. Work is really busy for him in the warm months and he needs to be able to schedule when work calms down.

## 2021-07-03 ENCOUNTER — Encounter (HOSPITAL_COMMUNITY): Payer: Self-pay

## 2021-07-03 ENCOUNTER — Other Ambulatory Visit: Payer: Self-pay

## 2021-07-03 DIAGNOSIS — M25552 Pain in left hip: Secondary | ICD-10-CM | POA: Insufficient documentation

## 2021-07-03 DIAGNOSIS — M79605 Pain in left leg: Secondary | ICD-10-CM | POA: Diagnosis not present

## 2021-07-03 DIAGNOSIS — Z85828 Personal history of other malignant neoplasm of skin: Secondary | ICD-10-CM | POA: Insufficient documentation

## 2021-07-03 NOTE — ED Notes (Signed)
Pt reports trying heat and cold therapy, compression wraps, muscle pain ointments and was previous prescribed Roxin for pain without any relief from symptoms. Pt wearing compression wrap at this time from home.

## 2021-07-03 NOTE — ED Triage Notes (Signed)
Pt arrived via POV c/o left upper leg pain since May. Pt reports it throbs all of the time and is difficult to bear weight on leg with ambulation. Pt reports being told by 2 different doctors he has a pulled quad muscle and/or arthritis.

## 2021-07-04 ENCOUNTER — Emergency Department (HOSPITAL_COMMUNITY)
Admission: EM | Admit: 2021-07-04 | Discharge: 2021-07-04 | Disposition: A | Discharge status: Home / Self Care | Payer: BC Managed Care – PPO | Attending: Emergency Medicine | Admitting: Emergency Medicine

## 2021-07-04 DIAGNOSIS — M79605 Pain in left leg: Secondary | ICD-10-CM

## 2021-07-04 MED ORDER — KETOROLAC TROMETHAMINE 60 MG/2ML IM SOLN
60.0000 mg | Freq: Once | INTRAMUSCULAR | Status: AC
  Administered 2021-07-04: 60 mg via INTRAMUSCULAR

## 2021-07-04 MED ORDER — MELOXICAM 15 MG PO TABS: 15.0000 mg | ORAL_TABLET | Freq: Every day | ORAL | 0 refills | Status: DC

## 2021-07-04 NOTE — ED Provider Notes (Signed)
[facility] EMERGENCY DEPARTMENT Provider Note   CSN: [id number] Arrival date & time: 07/03/21  2037     History Chief Complaint  Patient presents with   Leg Pain    Left upper leg    [name] is a 60 y.o. male.  Patient presents to the emergency department for evaluation of left upper leg pain.  Patient reports that this has been ongoing since May.  His primary doctor told him he has arthritis.  He has been seen by Dr. Luna Glasgow, orthopedics who told him it was a quadriceps strain.  Patient has been using compression wraps, ointments and also using ibuprofen.  He reports that he works long 12-hour shifts and has trouble getting through the shift without taking ibuprofen.      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/27/2016   Numbness and tingling in right hand 10/27/2016   Esophageal reflux 10/29/2014   Primary osteoarthritis of right knee 10/22/2014   Inguinal hernia 11/23/2013    Past Surgical History:  Procedure Laterality Date   FOOT SURGERY     HAND SURGERY Right    contracture of hand   INGUINAL HERNIA REPAIR Right 11/23/2013   Procedure: HERNIA REPAIR INGUINAL ADULT;  Surgeon: Scherry Ran, MD;  Location: [facility];  Service: General;  Laterality: Right;  site-inguinal area   OPEN REDUCTION, INTERNAL FIXATION (ORIF) CALCANEAL FRACTURE WITH FUSION Left 11/30/2018   Procedure: OPEN REDUCTION, INTERNAL FIXATION (ORIF) CALCANEAL FRACTURE WITH PERONEAL DEBRIDEMENT & REPAIR OF SUPERIOR PERONEAL RETINACULUM;  Surgeon: Erle Crocker, MD;  Location: [place];  Service: Orthopedics;  Laterality:  Left;   VASECTOMY     WRIST SURGERY Right    otif       Family History  Problem Relation Age of Onset   Stroke Mother    Cancer Mother        colon   Alzheimer's disease Father        29 years   Heart disease Father        atrial fibri   Heart disease Sister    Early death Brother    Cancer Maternal Grandmother     Social History   Tobacco Use   Smoking status: Never   Smokeless tobacco: Never  Vaping Use   Vaping Use: Never used  Substance Use Topics   Alcohol use: Yes    Alcohol/week: 4.0 standard drinks    Types: 4 Cans of beer per week    Comment: occ   Drug use: No    Home Medications Prior to Admission medications   Medication Sig Start Date End Date Taking? Authorizing Provider  meloxicam (MOBIC) 15 MG tablet Take 1 tablet (15 mg total) by mouth daily. Take right before going to work 07/04/21  Yes Mycala Warshawsky, Gwenyth Allegra, MD  acetaminophen (TYLENOL) 325 MG tablet Take 2 tablets (650 mg total) by mouth every 6 (six) hours as needed for mild pain, moderate pain, fever or headache. 11/23/19   Cherene Altes, MD  albuterol [facility] HFA) 108 (90  Base) MCG/ACT inhaler USE 2 PUFFS EVERY 6 HOURS AS NEEDED 12/22/20   Dettinger, [name], MD  gabapentin (NEURONTIN) 300 MG capsule Take 1 capsule (300 mg total) by mouth daily. 12/22/20   Dettinger, [name], MD  methocarbamol (ROBAXIN) 500 MG tablet Take 1 tablet (500 mg total) by mouth 3 (three) times daily. 05/29/21   Ivy Lynn, NP  pantoprazole (PROTONIX) 40 MG tablet Take 1 tablet (40 mg total) by mouth daily. 12/22/20   Dettinger, [name], MD  predniSONE (STERAPRED UNI-PAK 21 TAB) 10 MG (21) TBPK tablet 6 tablet day 1,5 tablet day 2, 4 tablet day 3, 3 tablet day 4, 2 tablet day 5, 1 tablet day 6 05/29/21   Ivy Lynn, NP  rosuvastatin (CRESTOR) 10 MG tablet Take 1 tablet (10 mg total) by mouth daily. 01/19/21   Gwenlyn Perking, FNP    Allergies    Morphine and related and Bee venom  Review of Systems    Review of Systems  Musculoskeletal:  Positive for myalgias.  All other systems reviewed and are negative.  Physical Exam Updated Vital Signs BP (!) 151/106 (BP Location: Right Arm)   Pulse 86   Temp 98.5 F (36.9 C) (Oral)   Resp 18   Ht '5\' 5"'$  (1.651 m)   Wt 89.8 kg   SpO2 97%   BMI 32.95 kg/m   Physical Exam Vitals and nursing note reviewed.  Constitutional:      Appearance: Normal appearance.  HENT:     Head: Normocephalic and atraumatic.  Pulmonary:     Effort: Pulmonary effort is normal.  Musculoskeletal:     Left hip: No deformity. Normal range of motion. Normal strength.     Left upper leg: Tenderness present. No swelling, deformity or lacerations.     Comments: Inguinal area pain with flexion of the hip noted  Neurological:     Mental Status: He is alert.    ED Results / Procedures / Treatments   Labs (all labs ordered are listed, but only abnormal results are displayed) Labs Reviewed - No data to display  EKG None  Radiology No results found.  Procedures Procedures   Medications Ordered in ED Medications  ketorolac (TORADOL) injection 60 mg (has no administration in time range)    ED Course  I have reviewed the triage vital signs and the nursing notes.  Pertinent labs & imaging results that were available during my care of the patient were reviewed by me and considered in my medical decision making (see chart for details).    MDM Rules/Calculators/A&P                           Patient with persistent pain in the area of the left hip and upper leg.  X-ray from May was reviewed, he does have some evidence of osteoarthritis of the hip.  He is experiencing some inguinal pain with movement of the hip which could reflect this arthritis.  He also, however, is experiencing predominant pain in the thigh area which could be muscle strain.  No imaging required at this time.  We will try Mobic.  Patient is to follow up with Dr. Luna Glasgow to see if there are  further treatments or imaging studies necessary, or if physical therapy might be of help.  Final Clinical Impression(s) / ED Diagnoses Final diagnoses:  Left leg pain    Rx / DC Orders ED Discharge Orders  Ordered    meloxicam (MOBIC) 15 MG tablet  Daily        07/04/21 0056             Orpah Greek, MD 07/04/21 [id number]

## 2021-08-13 ENCOUNTER — Encounter: Payer: Self-pay | Admitting: Family Medicine

## 2021-08-13 ENCOUNTER — Other Ambulatory Visit: Payer: Self-pay

## 2021-08-13 ENCOUNTER — Ambulatory Visit: Payer: BC Managed Care – PPO | Admitting: Family Medicine

## 2021-08-13 VITALS — BP 119/72 | HR 75 | Temp 98.7°F | Ht 65.0 in | Wt 193.4 lb

## 2021-08-13 DIAGNOSIS — M1612 Unilateral primary osteoarthritis, left hip: Secondary | ICD-10-CM | POA: Diagnosis not present

## 2021-08-13 DIAGNOSIS — M79605 Pain in left leg: Secondary | ICD-10-CM | POA: Diagnosis not present

## 2021-08-13 MED ORDER — MELOXICAM 15 MG PO TABS: 15.0000 mg | ORAL_TABLET | Freq: Every day | ORAL | 1 refills | Status: DC

## 2021-08-13 MED ORDER — METHOCARBAMOL 500 MG PO TABS: 500.0000 mg | ORAL_TABLET | Freq: Three times a day (TID) | ORAL | 0 refills | Status: DC

## 2021-08-13 NOTE — Patient Instructions (Signed)
Hip Pain The hip is the joint between the upper legs and the lower pelvis. The bones, cartilage, tendons, and muscles of your hip joint support your body and allow you to move around. Hip pain can range from a minor ache to severe pain in one or both of your hips. The pain may be felt on the inside of the hip joint near the groin, or on the outside near the buttocks and upper thigh. You may also have swelling or stiffness in your hip area. Follow these instructions at home: Managing pain, stiffness, and swelling   If directed, put ice on the painful area. To do this: Put ice in a plastic bag. Place a towel between your skin and the bag. Leave the ice on for 20 minutes, 2-3 times a day. If directed, apply heat to the affected area as often as told by your health care provider. Use the heat source that your health care provider recommends, such as a moist heat pack or a heating pad. Place a towel between your skin and the heat source. Leave the heat on for 20-30 minutes. Remove the heat if your skin turns bright red. This is especially important if you are unable to feel pain, heat, or cold. You may have a greater risk of getting burned. Activity Do exercises as told by your health care provider. Avoid activities that cause pain. General instructions  Take over-the-counter and prescription medicines only as told by your health care provider. Keep a journal of your symptoms. Write down: How often you have hip pain. The location of your pain. What the pain feels like. What makes the pain worse. Sleep with a pillow between your legs on your most comfortable side. Keep all follow-up visits as told by your health care provider. This is important. Contact a health care provider if: You cannot put weight on your leg. Your pain or swelling continues or gets worse after one week. It gets harder to walk. You have a fever. Get help right away if: You fall. You have a sudden increase in pain and  swelling in your hip. Your hip is red or swollen or very tender to touch. Summary Hip pain can range from a minor ache to severe pain in one or both of your hips. The pain may be felt on the inside of the hip joint near the groin, or on the outside near the buttocks and upper thigh. Avoid activities that cause pain. Write down how often you have hip pain, the location of the pain, what makes it worse, and what it feels like. This information is not intended to replace advice given to you by your health care provider. Make sure you discuss any questions you have with your health care provider. Document Revised: 04/09/2019 Document Reviewed: 04/09/2019 Elsevier Patient Education  2022 [employer] Inc.  

## 2021-08-13 NOTE — Progress Notes (Signed)
Acute Office Visit  Subjective:    Patient ID: [name], male    DOB: [birth year], 60 y.o.   MRN: [id number]  Chief Complaint  Patient presents with  . Leg Pain    HPI Patient is in today for left hip and leg pain x 4 months. He has had a xray of his left hip that showed mild OA. He was also referred to ortho who told him he had a quadriceps strain. He wore a quad brace as recommended by ortho. His symptoms have improved overall but has still persisted. He has pain in his left groin that radiates down to his knee. The pain is an intermittent. It is achy and sharp. The pain is worse with weight bearing and rotating his hip. The pain is a 9/10. He sometimes has numbness and tingling in this leg. He has taken meloxicam which has been helpful and robaxin that was also helpful. He is out of these medications. He denies injury, fever, warmth, swelling, or changing of gait.   Past Medical History:  Diagnosis Date  . Allergy   . Arthritis   . Calcaneus fracture, left   . Cancer [facility])    Skin cancer Left Arm  . GERD (gastroesophageal reflux disease)   . Small bowel obstruction [facility])     Past Surgical History:  Procedure Laterality Date  . FOOT SURGERY    . HAND SURGERY Right    contracture of hand  . INGUINAL HERNIA REPAIR Right 11/23/2013   Procedure: HERNIA REPAIR INGUINAL ADULT;  Surgeon: Scherry Ran, MD;  Location: [facility];  Service: General;  Laterality: Right;  site-inguinal area  . OPEN REDUCTION, INTERNAL FIXATION (ORIF) CALCANEAL FRACTURE WITH FUSION Left 11/30/2018   Procedure: OPEN REDUCTION, INTERNAL FIXATION (ORIF) CALCANEAL FRACTURE WITH PERONEAL DEBRIDEMENT & REPAIR OF SUPERIOR PERONEAL RETINACULUM;  Surgeon: Erle Crocker, MD;  Location: [place];  Service: Orthopedics;  Laterality: Left;  Marland Kitchen VASECTOMY    . WRIST SURGERY Right    otif    Family History  Problem Relation Age of Onset  . Stroke Mother   . Cancer Mother        colon  . Alzheimer's  disease Father        74 years  . Heart disease Father        atrial fibri  . Heart disease Sister   . Early death Brother   . Cancer Maternal Grandmother     Social History   Socioeconomic History  . Marital status: Married    Spouse name: Not on file  . Number of children: Not on file  . Years of education: Not on file  . Highest education level: Not on file  Occupational History  . Not on file  Tobacco Use  . Smoking status: Never  . Smokeless tobacco: Never  Vaping Use  . Vaping Use: Never used  Substance and Sexual Activity  . Alcohol use: Yes    Alcohol/week: 4.0 standard drinks    Types: 4 Cans of beer per week    Comment: occ  . Drug use: No  . Sexual activity: Yes    Birth control/protection: None    Comment: married 2005  Other Topics Concern  . Not on file  Social History Narrative  . Not on file   Social Determinants of Health   Financial Resource Strain: Not on file  Food Insecurity: Not on file  Transportation Needs: Not on file  Physical Activity: Not on  file  Stress: Not on file  Social Connections: Not on file  Intimate Partner Violence: Not on file    Outpatient Medications Prior to Visit  Medication Sig Dispense Refill  . acetaminophen (TYLENOL) 325 MG tablet Take 2 tablets (650 mg total) by mouth every 6 (six) hours as needed for mild pain, moderate pain, fever or headache.    . albuterol (PROAIR HFA) 108 (90 Base) MCG/ACT inhaler USE 2 PUFFS EVERY 6 HOURS AS NEEDED 8.5 g 3  . gabapentin (NEURONTIN) 300 MG capsule Take 1 capsule (300 mg total) by mouth daily. 90 capsule 3  . pantoprazole (PROTONIX) 40 MG tablet Take 1 tablet (40 mg total) by mouth daily. 90 tablet 3  . rosuvastatin (CRESTOR) 10 MG tablet Take 1 tablet (10 mg total) by mouth daily. 90 tablet 3  . meloxicam (MOBIC) 15 MG tablet Take 1 tablet (15 mg total) by mouth daily. Take right before going to work (Patient not taking: Reported on 08/13/2021) 20 tablet 0  . methocarbamol  (ROBAXIN) 500 MG tablet Take 1 tablet (500 mg total) by mouth 3 (three) times daily. (Patient not taking: Reported on 08/13/2021) 30 tablet 0  . predniSONE (STERAPRED UNI-PAK 21 TAB) 10 MG (21) TBPK tablet 6 tablet day 1,5 tablet day 2, 4 tablet day 3, 3 tablet day 4, 2 tablet day 5, 1 tablet day 6 1 each 0   No facility-administered medications prior to visit.    Allergies  Allergen Reactions  . Morphine And Related Swelling  . Bee Venom Rash    Review of Systems As per HPI.     Objective:    Physical Exam Vitals and nursing note reviewed.  Constitutional:      General: He is not in acute distress.    Appearance: Normal appearance. He is not ill-appearing, toxic-appearing or diaphoretic.  Pulmonary:     Effort: Pulmonary effort is normal. No respiratory distress.  Musculoskeletal:     Left hip: No deformity, tenderness or bony tenderness.     Left upper leg: No swelling, edema, deformity, tenderness or bony tenderness.     Right lower leg: No edema.     Left lower leg: No edema.  Skin:    General: Skin is warm and dry.  Neurological:     General: No focal deficit present.     Mental Status: He is alert and oriented to person, place, and time.     Motor: No weakness.     Gait: Gait normal.  Psychiatric:        Mood and Affect: Mood normal.        Behavior: Behavior normal.    BP 119/72   Pulse 75   Temp 98.7 F (37.1 C) (Temporal)   Ht '5\' 5"'$  (1.651 m)   Wt 193 lb 6 oz (87.7 kg)   BMI 32.18 kg/m  Wt Readings from Last 3 Encounters:  08/13/21 193 lb 6 oz (87.7 kg)  07/03/21 198 lb (89.8 kg)  06/17/21 197 lb (89.4 kg)    Health Maintenance Due  Topic Date Due  . INFLUENZA VACCINE  07/06/2021    There are no preventive care reminders to display for this patient.   Lab Results  Component Value Date   TSH 1.130 08/13/2016   Lab Results  Component Value Date   WBC 11.2 (H) 04/25/2021   HGB 15.5 04/25/2021   HCT 45.8 04/25/2021   MCV 99.1 04/25/2021    PLT 209 04/25/2021   Lab Results  Component Value Date   NA 138 04/25/2021   K 3.7 04/25/2021   CO2 26 04/25/2021   GLUCOSE 108 (H) 04/25/2021   BUN 13 04/25/2021   CREATININE 0.94 04/25/2021   BILITOT 0.6 04/25/2021   ALKPHOS 70 04/25/2021   AST 24 04/25/2021   ALT 29 04/25/2021   PROT 7.3 04/25/2021   ALBUMIN 4.0 04/25/2021   CALCIUM 8.7 (L) 04/25/2021   ANIONGAP 9 04/25/2021   Lab Results  Component Value Date   CHOL 218 (H) 06/17/2021   Lab Results  Component Value Date   HDL 43 06/17/2021   Lab Results  Component Value Date   LDLCALC 150 (H) 06/17/2021   Lab Results  Component Value Date   TRIG 139 06/17/2021   Lab Results  Component Value Date   CHOLHDL 5.1 (H) 06/17/2021   Lab Results  Component Value Date   HGBA1C 6.0 (H) 11/19/2019       Assessment & Plan:   Zora was seen today for leg pain.  Diagnoses and all orders for this visit:  Left leg pain Left hip arthritis Chronic History of left OA and left quad strain. Refills of robaxin and mobic ordered. Referral to PT today. Discussed follow up with ortho if no improvement with PT.  -     meloxicam (MOBIC) 15 MG tablet; Take 1 tablet (15 mg total) by mouth daily. Take right before going to work -     methocarbamol (ROBAXIN) 500 MG tablet; Take 1 tablet (500 mg total) by mouth 3 (three) times daily. -     Ambulatory referral to Physical Therapy  Return to office for new or worsening symptoms, or if symptoms persist.   The patient indicates understanding of these issues and agrees with the plan.  Gwenlyn Perking, FNP

## 2021-09-03 ENCOUNTER — Other Ambulatory Visit: Payer: Self-pay | Admitting: Family Medicine

## 2021-09-03 DIAGNOSIS — G8929 Other chronic pain: Secondary | ICD-10-CM

## 2021-09-08 DIAGNOSIS — R051 Acute cough: Secondary | ICD-10-CM | POA: Diagnosis not present

## 2021-09-08 DIAGNOSIS — J0100 Acute maxillary sinusitis, unspecified: Secondary | ICD-10-CM | POA: Diagnosis not present

## 2021-09-23 ENCOUNTER — Ambulatory Visit: Payer: BC Managed Care – PPO

## 2021-09-24 ENCOUNTER — Other Ambulatory Visit: Payer: Self-pay | Admitting: Family Medicine

## 2021-09-24 ENCOUNTER — Ambulatory Visit: Payer: BC Managed Care – PPO

## 2021-09-24 ENCOUNTER — Other Ambulatory Visit: Payer: Self-pay

## 2021-09-24 ENCOUNTER — Encounter: Payer: Self-pay | Admitting: Orthopaedic Surgery

## 2021-09-24 ENCOUNTER — Ambulatory Visit: Payer: BC Managed Care – PPO | Admitting: Orthopaedic Surgery

## 2021-09-24 VITALS — BP 150/94 | HR 83 | Ht 65.0 in | Wt 197.6 lb

## 2021-09-24 DIAGNOSIS — M25552 Pain in left hip: Secondary | ICD-10-CM

## 2021-09-24 NOTE — Telephone Encounter (Signed)
Patient cannot take this with the meloxicam, looks like to have meloxicam, if he is still taking the meloxicam then he cannot take the ibuprofen.

## 2021-09-24 NOTE — Progress Notes (Signed)
My left hip really hurts.  He has had prior left hamstring pull on the left.  Now he has been having increasing pain in motion of the left hip.  He went to the ER in May and had X-rays showing degenerative changes in the hip. I have reviewed the notes and the films.  I have independently reviewed and interpreted x-rays of this patient done at another site by another physician or qualified health professional.   He began ibuprofen 800 on the days he worked that helped but his pain has gotten worse.  It is really bad at the end of a shift.  He is limping.  He has no new trauma.  Exam shows flexion of left hip to be 90, internal 10, external 15, adduction nearly full, abduction 20, extension 5.  Limp to the left.  NV intact.  No distal edema.  No knee pain.  X-rays were done of the left hip, reported separately.  Encounter Diagnosis  Name Primary?   Pain in left hip Yes   I will get MRI of the left hip to see the extent of the arthritis.  He has not gotten better with rest, time and medicine.    Return in three weeks.  Call if any problem.  Precautions discussed.  Electronically Signed Sanjuana Kava, MD 10/20/202210:35 AM

## 2021-09-24 NOTE — Addendum Note (Signed)
Addended by: Elizabeth Sauer on: 09/24/2021 10:42 AM   Modules accepted: Orders

## 2021-09-25 ENCOUNTER — Emergency Department (HOSPITAL_COMMUNITY)
Admission: EM | Admit: 2021-09-25 | Discharge: 2021-09-26 | Disposition: A | Discharge status: Home / Self Care | Payer: BC Managed Care – PPO | Attending: Emergency Medicine | Admitting: Emergency Medicine

## 2021-09-25 DIAGNOSIS — W1830XA Fall on same level, unspecified, initial encounter: Secondary | ICD-10-CM | POA: Diagnosis not present

## 2021-09-25 DIAGNOSIS — M25552 Pain in left hip: Secondary | ICD-10-CM | POA: Diagnosis not present

## 2021-09-25 DIAGNOSIS — S86912A Strain of unspecified muscle(s) and tendon(s) at lower leg level, left leg, initial encounter: Secondary | ICD-10-CM | POA: Diagnosis not present

## 2021-09-25 DIAGNOSIS — Z85828 Personal history of other malignant neoplasm of skin: Secondary | ICD-10-CM | POA: Insufficient documentation

## 2021-09-25 DIAGNOSIS — Z79899 Other long term (current) drug therapy: Secondary | ICD-10-CM | POA: Diagnosis not present

## 2021-09-25 DIAGNOSIS — S8392XA Sprain of unspecified site of left knee, initial encounter: Secondary | ICD-10-CM | POA: Diagnosis not present

## 2021-09-25 DIAGNOSIS — S76012A Strain of muscle, fascia and tendon of left hip, initial encounter: Secondary | ICD-10-CM | POA: Diagnosis not present

## 2021-09-25 DIAGNOSIS — M25562 Pain in left knee: Secondary | ICD-10-CM | POA: Diagnosis not present

## 2021-09-25 DIAGNOSIS — M79672 Pain in left foot: Secondary | ICD-10-CM | POA: Insufficient documentation

## 2021-09-25 DIAGNOSIS — S8992XA Unspecified injury of left lower leg, initial encounter: Secondary | ICD-10-CM | POA: Diagnosis not present

## 2021-09-25 NOTE — ED Triage Notes (Signed)
Pt states he has chronic left leg and hip pain. Tonight pt fell due to hip pain. Pt not on blood thinners and didn't hit head.Pt currently having worsening hip pain. Recent xray's on Thursday showing bone spurs. Pt awaiting insurance approval for MRI.

## 2021-09-26 ENCOUNTER — Other Ambulatory Visit: Payer: Self-pay

## 2021-09-26 ENCOUNTER — Encounter (HOSPITAL_COMMUNITY): Payer: Self-pay | Admitting: Radiology

## 2021-09-26 ENCOUNTER — Emergency Department (HOSPITAL_COMMUNITY): Payer: BC Managed Care – PPO

## 2021-09-26 DIAGNOSIS — M25552 Pain in left hip: Secondary | ICD-10-CM | POA: Diagnosis not present

## 2021-09-26 DIAGNOSIS — S8392XA Sprain of unspecified site of left knee, initial encounter: Secondary | ICD-10-CM | POA: Diagnosis not present

## 2021-09-26 DIAGNOSIS — M25562 Pain in left knee: Secondary | ICD-10-CM | POA: Diagnosis not present

## 2021-09-26 DIAGNOSIS — S86912A Strain of unspecified muscle(s) and tendon(s) at lower leg level, left leg, initial encounter: Secondary | ICD-10-CM | POA: Diagnosis not present

## 2021-09-26 DIAGNOSIS — M79672 Pain in left foot: Secondary | ICD-10-CM | POA: Diagnosis not present

## 2021-09-26 MED ORDER — HYDROCODONE-ACETAMINOPHEN 5-325 MG PO TABS
1.0000 | ORAL_TABLET | Freq: Once | ORAL | Status: AC
Start: 2021-09-26 — End: 2021-09-26
  Administered 2021-09-26: 1 via ORAL
  Filled 2021-09-26: qty 1

## 2021-09-26 NOTE — ED Provider Notes (Signed)
[facility] EMERGENCY DEPARTMENT Provider Note   CSN: [id number] Arrival date & time: 09/25/21  2344     History Chief Complaint  Patient presents with   [name] is a 60 y.o. male.  The history is provided by the patient.  Fall This is a new problem. The problem has not changed since onset.Pertinent negatives include no chest pain, no abdominal pain and no headaches. The symptoms are aggravated by walking. The symptoms are relieved by rest.  Patient with history of left hip pain likely due to arthritis presents with fall.  He reports at times his left hip will give out causing him to fall.  He fell tonight landing on his left side.  No head injury or LOC.  He is not on anticoagulation.  No neck or back pain.  He reports his pain is in his left hip, left knee, left foot.  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   Numbness and tingling in right hand 10/27/2016   Esophageal reflux 10/29/2014   Primary osteoarthritis of right knee 10/22/2014   Inguinal hernia 11/23/2013    Past Surgical History:  Procedure Laterality Date   FOOT SURGERY     HAND SURGERY Right    contracture of hand   INGUINAL HERNIA REPAIR Right 11/23/2013   Procedure: HERNIA REPAIR INGUINAL ADULT;  Surgeon: Scherry Ran, MD;  Location: [facility];  Service: General;  Laterality: Right;  site-inguinal area   OPEN REDUCTION, INTERNAL FIXATION (ORIF) CALCANEAL FRACTURE WITH FUSION Left 11/30/2018   Procedure: OPEN REDUCTION, INTERNAL FIXATION (ORIF) CALCANEAL FRACTURE WITH PERONEAL DEBRIDEMENT & REPAIR  OF SUPERIOR PERONEAL RETINACULUM;  Surgeon: Erle Crocker, MD;  Location: [place];  Service: Orthopedics;  Laterality: Left;   VASECTOMY     WRIST SURGERY Right    otif       Family History  Problem Relation Age of Onset   Stroke Mother    Cancer Mother        colon   Alzheimer's disease Father        80 years   Heart disease Father        atrial fibri   Heart disease Sister    Early death Brother    Cancer Maternal Grandmother     Social History   Tobacco Use   Smoking status: Never   Smokeless tobacco: Never  Vaping Use   Vaping Use: Never used  Substance Use Topics   Alcohol use: Yes    Alcohol/week: 4.0 standard drinks    Types: 4 Cans of beer per week    Comment: occ   Drug use: No    Home Medications Prior to Admission medications   Medication Sig Start Date End Date Taking? Authorizing Provider  acetaminophen (TYLENOL) 325 MG tablet Take 2 tablets (650 mg total) by mouth every 6 (six) hours as needed for mild pain, moderate pain, fever or headache. 11/23/19   Cherene Altes, MD  albuterol [facility] HFA)  108 (90 Base) MCG/ACT inhaler USE 2 PUFFS EVERY 6 HOURS AS NEEDED 12/22/20   Dettinger, [name], MD  gabapentin (NEURONTIN) 300 MG capsule Take 1 capsule (300 mg total) by mouth daily. 12/22/20   Dettinger, [name], MD  ibuprofen (ADVIL) 800 MG tablet Take 800 mg by mouth 3 (three) times daily as needed. 09/03/21   [provider]  meloxicam (MOBIC) 15 MG tablet Take 1 tablet (15 mg total) by mouth daily. Take right before going to work 08/13/21   Gwenlyn Perking, FNP  methocarbamol (ROBAXIN) 500 MG tablet Take 1 tablet (500 mg total) by mouth 3 (three) times daily. 08/13/21   Gwenlyn Perking, FNP  pantoprazole (PROTONIX) 40 MG tablet Take 1 tablet (40 mg total) by mouth daily. 12/22/20   Dettinger, [name], MD  rosuvastatin (CRESTOR) 10 MG tablet Take 1 tablet (10 mg total) by mouth daily. 01/19/21   Gwenlyn Perking, FNP    Allergies     Morphine and related and Bee venom  Review of Systems   Review of Systems  Constitutional:  Negative for fever.  Cardiovascular:  Negative for chest pain.  Gastrointestinal:  Negative for abdominal pain.  Musculoskeletal:  Positive for arthralgias. Negative for back pain and neck pain.  Neurological:  Negative for headaches.  All other systems reviewed and are negative.  Physical Exam Updated Vital Signs BP 135/90 (BP Location: Left Arm)   Pulse 77   Temp 97.8 F (36.6 C) (Oral)   Resp 17   Ht 1.651 m (5\' 5" )   Wt 89.4 kg   SpO2 100%   BMI 32.78 kg/m   Physical Exam CONSTITUTIONAL: Well developed/well nourished HEAD: Normocephalic/atraumatic NECK: supple no meningeal signs SPINE/BACK:entire spine nontender, no bruising/crepitance/stepoffs noted to spine CV: S1/S2 noted, no murmurs/rubs/gallops noted LUNGS: Lungs are clear to auscultation bilaterally, no apparent distress ABDOMEN: soft, nontender NEURO: Pt is awake/alert/appropriate, moves all extremitiesx4.  No facial droop.   EXTREMITIES: pulses normal/equal, full ROM Tenderness to range of motion of left hip, left knee and tenderness to palpation of left foot.  There is no ankle tenderness.  No deformities.  Distal pulses intact SKIN: warm, color normal PSYCH: no abnormalities of mood noted, alert and oriented to situation  ED Results / Procedures / Treatments   Labs (all labs ordered are listed, but only abnormal results are displayed) Labs Reviewed - No data to display  EKG None  Radiology DG Knee Complete 4 Views Left  Result Date: 09/26/2021 CLINICAL DATA:  Pain. Patient reports fall 2 years ago with increasing left knee pain. EXAM: LEFT KNEE - COMPLETE 4+ VIEW COMPARISON:  None. FINDINGS: No evidence of fracture, dislocation, or joint effusion. The joint spaces are preserved. Trace patellofemoral spurring. No erosion or focal bone abnormality. Soft tissues are unremarkable. IMPRESSION: Minimal patellofemoral  degenerative change.  No acute findings. Electronically Signed   By: Keith Rake M.D.   On: 09/26/2021 01:42   DG Foot Complete Left  Result Date: 09/26/2021 CLINICAL DATA:  Pain. Patient reports fall 2 years ago with increasing left foot pain. EXAM: LEFT FOOT - COMPLETE 3+ VIEW COMPARISON:  Preoperative imaging December 2019 FINDINGS: Lateral plate and multi screw fixation of previous calcaneal fracture. There is mild posttraumatic deformity without persistent fracture line. Subtalar evaluation is obscured on the lateral view due to overlying hardware. No acute fracture. No erosion or bony destruction. There is a moderate plantar calcaneal spur. No focal soft tissue abnormalities are seen. IMPRESSION: No acute  osseous abnormalities. Surgical fixation of remote calcaneal fracture. Intact hardware. Electronically Signed   By: Keith Rake M.D.   On: 09/26/2021 01:41   DG Hip Unilat W or Wo Pelvis 2-3 Views Left  Result Date: 09/26/2021 CLINICAL DATA:  Post fall with left hip pain. Patient reports fall 2 years ago with increasing pain. EXAM: DG HIP (WITH OR WITHOUT PELVIS) 2-3V LEFT COMPARISON:  Hip radiograph 2 days ago. FINDINGS: No acute fracture. Similar left hip joint space narrowing with acetabular spurring and mild joint space irregularity. No convincing avascular necrosis. Intact pubic rami. Pubic symphysis and sacroiliac joints are congruent. IMPRESSION: Mild osteoarthritis of the left hip.  No acute osseous findings. Electronically Signed   By: Keith Rake M.D.   On: 09/26/2021 01:39   DG HIP UNILAT W OR W/O PELVIS 2-3 VIEWS LEFT  Result Date: 09/24/2021 Clinical:  Increasing left hip pain, no trauma X-rays were done of the left hip, three views. There is osteoarthritis of the left hip, mild to moderate, without fracture noted.  Joint space narrowing is present and some changes in the superior acetabulum. Impression:  Osteoarthritis of the left hip, no fracture. Electronically  [place], MD 10/20/202210:27 AM    Procedures Procedures   Medications Ordered in ED Medications  HYDROcodone-acetaminophen (NORCO/VICODIN) 5-325 MG per tablet 1 tablet (1 tablet Oral Given 09/26/21 0205)    ED Course  I have reviewed the triage vital signs and the nursing notes.  Pertinent  imaging results that were available during my care of the patient were reviewed by me and considered in my medical decision making (see chart for details).    MDM Rules/Calculators/A&P                           Patient presents after a fall because he reports his left hip gave out.  No head injury or LOC No acute fractures noted. Patient otherwise stable and appropriate for discharge home.  He will follow with orthopedics next week Final Clinical Impression(s) / ED Diagnoses Final diagnoses:  Hip strain, left, initial encounter  Sprain of left knee, unspecified ligament, initial encounter  Left foot pain    Rx / DC Orders ED Discharge Orders     None        Ripley Fraise, MD 09/26/21 [phone]

## 2021-09-28 ENCOUNTER — Telehealth: Payer: Self-pay | Admitting: Orthopaedic Surgery

## 2021-09-28 ENCOUNTER — Encounter: Payer: Self-pay | Admitting: Orthopaedic Surgery

## 2021-09-28 NOTE — Telephone Encounter (Signed)
Patient requests note to return to work today/tonight - works 3rd shift - following emergency room 09/25/21. Aware Dr Luna Glasgow is out of clinic.

## 2021-10-06 NOTE — Telephone Encounter (Signed)
Called back to patient; aware okay for note (had been issued)

## 2021-10-27 ENCOUNTER — Ambulatory Visit (HOSPITAL_COMMUNITY)
Admission: RE | Admit: 2021-10-27 | Discharge: 2021-10-27 | Disposition: A | Discharge status: Home / Self Care | Payer: BC Managed Care – PPO | Source: Ambulatory Visit | Attending: Orthopaedic Surgery | Admitting: Orthopaedic Surgery

## 2021-10-27 ENCOUNTER — Other Ambulatory Visit: Payer: Self-pay

## 2021-10-27 DIAGNOSIS — M25552 Pain in left hip: Secondary | ICD-10-CM | POA: Diagnosis not present

## 2021-10-27 DIAGNOSIS — R600 Localized edema: Secondary | ICD-10-CM | POA: Diagnosis not present

## 2021-10-27 DIAGNOSIS — M19072 Primary osteoarthritis, left ankle and foot: Secondary | ICD-10-CM | POA: Diagnosis not present

## 2021-10-27 DIAGNOSIS — M25452 Effusion, left hip: Secondary | ICD-10-CM | POA: Diagnosis not present

## 2021-10-27 DIAGNOSIS — M1612 Unilateral primary osteoarthritis, left hip: Secondary | ICD-10-CM | POA: Diagnosis not present

## 2021-11-05 ENCOUNTER — Ambulatory Visit: Payer: BC Managed Care – PPO | Admitting: Orthopaedic Surgery

## 2021-11-05 ENCOUNTER — Other Ambulatory Visit: Payer: Self-pay

## 2021-11-05 ENCOUNTER — Encounter: Payer: Self-pay | Admitting: Orthopaedic Surgery

## 2021-11-05 DIAGNOSIS — M25552 Pain in left hip: Secondary | ICD-10-CM

## 2021-11-05 DIAGNOSIS — M87052 Idiopathic aseptic necrosis of left femur: Secondary | ICD-10-CM

## 2021-11-05 NOTE — Patient Instructions (Addendum)
While you're waiting for your appointment start writing down your questions that you would like to ask the specialist at your appointment.

## 2021-11-05 NOTE — Progress Notes (Signed)
My hip hurts.  He had the MRI of the left hip.  It showed: IMPRESSION: 1. 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 2. Significant/advanced secondary degenerative changes involving the left hip joint. 3. Small focus of chronic AVN involving the right hip. 4. 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  I have explained the findings to him.  I have recommended total hip replacement.  I have independently reviewed the MRI.      He had had surgery with [facility] in the past.  He would like to see them.  I will make the referral.  He has painful motion of the left hip with limp and decreased motion.  NV intact.  He has no distal edema.  Encounter Diagnoses  Name Primary?   Pain in left hip Yes   Avascular necrosis of bone of left hip ([place])    To be seen at [place].  Call if any problem.  Precautions discussed.  Electronically Signed Sanjuana Kava, MD 12/1/202210:02 AM

## 2021-11-10 DIAGNOSIS — M25552 Pain in left hip: Secondary | ICD-10-CM | POA: Diagnosis not present

## 2021-11-10 DIAGNOSIS — M1612 Unilateral primary osteoarthritis, left hip: Secondary | ICD-10-CM | POA: Diagnosis not present

## 2021-11-18 ENCOUNTER — Telehealth: Payer: Self-pay | Admitting: Family Medicine

## 2021-11-18 NOTE — Telephone Encounter (Signed)
Left message for pt to call back to schedule surgery clearance with Monia Pouch since PCP does not have any openings before pts surgery date (12/11/21).  (Received pt's surgery clearance form. Will put in surgery clearance folder on side A.)

## 2021-11-19 ENCOUNTER — Ambulatory Visit: Payer: BC Managed Care – PPO | Admitting: Family Medicine

## 2021-11-19 ENCOUNTER — Other Ambulatory Visit: Payer: Self-pay

## 2021-11-19 ENCOUNTER — Encounter: Payer: Self-pay | Admitting: Family Medicine

## 2021-11-19 VITALS — BP 117/72 | HR 68 | Temp 98.3°F | Ht 65.0 in | Wt 191.0 lb

## 2021-11-19 DIAGNOSIS — Z23 Encounter for immunization: Secondary | ICD-10-CM

## 2021-11-19 DIAGNOSIS — R739 Hyperglycemia, unspecified: Secondary | ICD-10-CM | POA: Diagnosis not present

## 2021-11-19 DIAGNOSIS — Z01818 Encounter for other preprocedural examination: Secondary | ICD-10-CM

## 2021-11-19 LAB — BAYER DCA HB A1C WAIVED: HB A1C (BAYER DCA - WAIVED): 5.1 % (ref 4.8–5.6)

## 2021-11-19 NOTE — Progress Notes (Signed)
° °  Pt is a 60 y.o. male who is here for preoperative clearance for left hip replacement.   1) High Risk Cardiac Conditions  1) Recent MI - No.  2) Decompensated Heart Failure - No.  3) Unstable angina - No.  4) Symptomatic arrythmia - No.  5) Sx Valvular Disease - No.  2) Intermediate Risk Factors - DM, CKD, CVA, CHF, CAD - No.  2) Functional Status - > 4 mets (Walk, run, climb stairs) Yes.  Marland Kitchen   3) Surgery Specific Risk -  Intermediate (Carotid, Head and Neck, Orthopaedic )  4) Further Noninvasive evaluation -   1) EKG - No.   1) Hx of CVA, CAD, DM, CKD  2) Echo - No.   1) Worsening dyspnea   3) Stress Testing - Active Cardiac Disease - No.  5) Need for medical therapy - Beta Blocker, Statins indicated ? No.  PE: There were no vitals filed for this visit. Physical Examination: General appearance - alert, well appearing, and in no distress Mental status - alert, oriented to person, place, and time Eyes - pupils equal and reactive, extraocular eye movements intact Ears - bilateral TM's and external ear canals normal Nose - normal and patent, no erythema, discharge or polyps Mouth - mucous membranes moist, pharynx normal without lesions Neck - supple, no significant adenopathy Chest - clear to auscultation, no wheezes, rales or rhonchi, symmetric air entry Heart - normal rate, regular rhythm, normal S1, S2, no murmurs, rubs, clicks or gallops Abdomen - soft, nontender, nondistended, no masses or organomegaly Neurological - alert, oriented, normal speech, no focal findings or movement disorder noted Musculoskeletal - no joint tenderness, deformity or swelling Extremities - peripheral pulses normal, no pedal edema, no clubbing or cyanosis Skin - normal coloration and turgor, no rashes, no suspicious skin lesions noted  Assessment and Plan  [name] was seen today for pre-op exam.  Diagnoses and all orders for this visit:  Pre-op exam Normal labs. Will fax form for surgical  clearance as requested.  -     CBC with Differential/Platelet -     CMP14+EGFR -     Bayer DCA Hb A1c Waived -     Protime-INR  Need for immunization against influenza -     Flu Vaccine QUAD 109mo+IM (Fluarix, Fluzone & Alfiuria Quad PF)   I have independently evaluated patient.  [name] is a 60 y.o. male who is low risk for a intermediate risk surgery.  There are/ are not modifiable risk factors (smoking, etc). [name] calculation for MACE is: 0.9%.    No -CXR (if asx/healthy/no resp issues don't need) No - EKG (?h/o MI, CAD, etc; usually don't need for low risk sx) No - PFTs (significant cardiopulm hx? OSA/OHS) No - Echo (get if CHF and has not had in >1 year OR if worse HF symptoms) Review meds: yes   Marjorie Smolder, FNP [facility]

## 2021-11-20 LAB — CMP14+EGFR
ALT: 21 IU/L (ref 0–44)
AST: 18 IU/L (ref 0–40)
Albumin/Globulin Ratio: 1.9 (ref 1.2–2.2)
Albumin: 4.7 g/dL (ref 3.8–4.9)
Alkaline Phosphatase: 99 IU/L (ref 44–121)
BUN/Creatinine Ratio: 12 (ref 9–20)
BUN: 12 mg/dL (ref 6–24)
Bilirubin Total: 0.4 mg/dL (ref 0.0–1.2)
CO2: 24 mmol/L (ref 20–29)
Calcium: 9.5 mg/dL (ref 8.7–10.2)
Chloride: 99 mmol/L (ref 96–106)
Creatinine, Ser: 0.98 mg/dL (ref 0.76–1.27)
Globulin, Total: 2.5 g/dL (ref 1.5–4.5)
Glucose: 97 mg/dL (ref 70–99)
Potassium: 4.4 mmol/L (ref 3.5–5.2)
Sodium: 139 mmol/L (ref 134–144)
Total Protein: 7.2 g/dL (ref 6.0–8.5)
eGFR: 89 mL/min/{1.73_m2} (ref >59)

## 2021-11-20 LAB — CBC WITH DIFFERENTIAL/PLATELET
Basophils Absolute: 0.1 10*3/uL (ref 0.0–0.2)
Basos: 1 %
EOS (ABSOLUTE): 0.1 10*3/uL (ref 0.0–0.4)
Eos: 1 %
Hematocrit: 49.3 % (ref 37.5–51.0)
Hemoglobin: 17.3 g/dL (ref 13.0–17.7)
Immature Grans (Abs): 0 10*3/uL (ref 0.0–0.1)
Immature Granulocytes: 0 %
Lymphocytes Absolute: 2.8 10*3/uL (ref 0.7–3.1)
Lymphs: 31 %
MCH: 33 pg (ref 26.6–33.0)
MCHC: 35.1 g/dL (ref 31.5–35.7)
MCV: 94 fL (ref 79–97)
Monocytes Absolute: 0.6 10*3/uL (ref 0.1–0.9)
Monocytes: 7 %
Neutrophils Absolute: 5.4 10*3/uL (ref 1.4–7.0)
Neutrophils: 60 %
Platelets: 300 10*3/uL (ref 150–450)
RBC: 5.25 x10E6/uL (ref 4.14–5.80)
RDW: 12.4 % (ref 11.6–15.4)
WBC: 9 10*3/uL (ref 3.4–10.8)

## 2021-11-20 LAB — PROTIME-INR
INR: 1 (ref 0.9–1.2)
Prothrombin Time: 10.6 s (ref 9.1–12.0)

## 2021-12-01 DIAGNOSIS — M1612 Unilateral primary osteoarthritis, left hip: Secondary | ICD-10-CM | POA: Diagnosis not present

## 2021-12-02 DIAGNOSIS — M1612 Unilateral primary osteoarthritis, left hip: Secondary | ICD-10-CM | POA: Diagnosis not present

## 2021-12-11 DIAGNOSIS — M1612 Unilateral primary osteoarthritis, left hip: Secondary | ICD-10-CM | POA: Diagnosis not present

## 2021-12-18 ENCOUNTER — Ambulatory Visit: Payer: BC Managed Care – PPO | Admitting: Family Medicine

## 2022-02-11 ENCOUNTER — Ambulatory Visit: Payer: BC Managed Care – PPO | Admitting: Family Medicine

## 2022-02-17 ENCOUNTER — Encounter: Payer: Self-pay | Admitting: Family Medicine

## 2022-02-17 ENCOUNTER — Ambulatory Visit: Payer: BC Managed Care – PPO | Admitting: Family Medicine

## 2022-02-17 VITALS — BP 141/93 | HR 107 | Ht 65.0 in | Wt 198.0 lb

## 2022-02-17 DIAGNOSIS — K219 Gastro-esophageal reflux disease without esophagitis: Secondary | ICD-10-CM

## 2022-02-17 DIAGNOSIS — J40 Bronchitis, not specified as acute or chronic: Secondary | ICD-10-CM | POA: Diagnosis not present

## 2022-02-17 DIAGNOSIS — E782 Mixed hyperlipidemia: Secondary | ICD-10-CM | POA: Diagnosis not present

## 2022-02-17 MED ORDER — ALBUTEROL SULFATE HFA 108 (90 BASE) MCG/ACT IN AERS: INHALATION_SPRAY | RESPIRATORY_TRACT | 3 refills | Status: DC

## 2022-02-17 MED ORDER — PANTOPRAZOLE SODIUM 40 MG PO TBEC: 40.0000 mg | DELAYED_RELEASE_TABLET | Freq: Every day | ORAL | 3 refills | Status: DC

## 2022-02-17 NOTE — Progress Notes (Signed)
? ?BP (!) 141/93   Pulse (!) 107   Ht $R'5\' 5"'eV$  (1.651 m)   Wt 198 lb (89.8 kg)   SpO2 97%   BMI 32.95 kg/m?   ? ?Subjective:  ? ?Patient ID: [name], male    DOB: Dec 22, 1960, 61 y.o.   MRN: [id number] ? ?HPI: ?[name] is a 61 y.o. male presenting on 02/17/2022 for Medical Management of Chronic Issues and Hyperlipidemia ? ? ?HPI ?Bronchospasm ?Patient is coming in for refill of albuterol inhaler.  Uses it for the occasional bronchospasm that he gets especially when he is at work.  He does work at a [employer] and so that irritates and causes these issues.  He does not use very often but does use occasionally. ? ?Hyperlipidemia ?Patient is coming in for recheck of his hyperlipidemia. The patient is currently taking no medication currently, stopped his Crestor because he heard from somebody that it was bad. They deny any issues with myalgias or history of liver damage from it. They deny any focal numbness or weakness or chest pain.  ? ?GERD ?Patient is currently on Protonix.  She denies any major symptoms or abdominal pain or belching or burping. She denies any blood in her stool or lightheadedness or dizziness.  ? ?Relevant past medical, surgical, family and social history reviewed and updated as indicated. Interim medical history since our last visit reviewed. ?Allergies and medications reviewed and updated. ? ?Review of Systems  ?Constitutional:  Negative for chills and fever.  ?Eyes:  Negative for visual disturbance.  ?Respiratory:  Negative for shortness of breath and wheezing.   ?Cardiovascular:  Negative for chest pain and leg swelling.  ?Musculoskeletal:  Negative for back pain and gait problem.  ?Skin:  Negative for rash.  ?Neurological:  Negative for dizziness, weakness and numbness.  ?All other systems reviewed and are negative. ? ?Per HPI unless specifically indicated above ? ? ?Allergies as of 02/17/2022   ? ?   Reactions  ? Morphine And Related Swelling  ? Bee Venom Rash  ? ?  ? ?   ?Medication List  ?  ? ?  ? Accurate as of February 17, 2022  2:17 PM. If you have any questions, ask your nurse or doctor.  ?  ?  ? ?  ? ?STOP taking these medications   ? ?acetaminophen 325 MG tablet ?Commonly known as: TYLENOL ?Stopped by: [name] ?  ?gabapentin 300 MG capsule ?Commonly known as: NEURONTIN ?Stopped by: [name] ?  ?ibuprofen 800 MG tablet ?Commonly known as: ADVIL ?Stopped by: [name] ?  ?meloxicam 15 MG tablet ?Commonly known as: Mobic ?Stopped by: [name] ?  ?methocarbamol 500 MG tablet ?Commonly known as: Robaxin ?Stopped by: [name] ?  ?rosuvastatin 10 MG tablet ?Commonly known as: Crestor ?Stopped by: [name] ?  ? ?  ? ?TAKE these medications   ? ?albuterol 108 (90 Base) MCG/ACT inhaler ?Commonly known as: ProAir HFA ?USE 2 PUFFS EVERY 6 HOURS AS NEEDED ?  ?pantoprazole 40 MG tablet ?Commonly known as: PROTONIX ?Take 1 tablet (40 mg total) by mouth daily. ?  ? ?  ? ? ? ?Objective:  ? ?BP (!) 141/93   Pulse (!) 107   Ht $R'5\' 5"'VP$  (1.651 m)   Wt 198 lb (89.8 kg)   SpO2 97%   BMI 32.95 kg/m?   ?Wt Readings from Last 3 Encounters:  ?02/17/22  198 lb (89.8 kg)  ?11/19/21 191 lb (86.6 kg)  ?09/26/21 197 lb (89.4 kg)  ?  ?Physical Exam ?Vitals and nursing note reviewed.  ?Constitutional:   ?   General: He is not in acute distress. ?   Appearance: He is well-developed. He is not diaphoretic.  ?Eyes:  ?   General: No scleral icterus. ?   Conjunctiva/sclera: Conjunctivae normal.  ?Neck:  ?   Thyroid: No thyromegaly.  ?Cardiovascular:  ?   Rate and Rhythm: Normal rate and regular rhythm.  ?   Heart sounds: Normal heart sounds. No murmur heard. ?Pulmonary:  ?   Effort: Pulmonary effort is normal. No respiratory distress.  ?   Breath sounds: Normal breath sounds. No wheezing.  ?Musculoskeletal:     ?   General: No swelling. Normal range of motion.  ?   Cervical back: Neck supple.  ?Lymphadenopathy:  ?   Cervical: No  cervical adenopathy.  ?Skin: ?   General: Skin is warm and dry.  ?   Findings: No rash.  ?Neurological:  ?   Mental Status: He is alert and oriented to person, place, and time.  ?   Coordination: Coordination normal.  ?Psychiatric:     ?   Behavior: Behavior normal.  ? ? ? ? ?Assessment & Plan:  ? ?Problem List Items Addressed This Visit   ? ?  ? Digestive  ? Esophageal reflux  ? Relevant Medications  ? pantoprazole (PROTONIX) 40 MG tablet  ? Other Relevant Orders  ? CBC with Differential/Platelet  ?  ? Other  ? Mixed hyperlipidemia - Primary  ? Relevant Orders  ? CBC with Differential/Platelet  ? CMP14+EGFR  ? Lipid panel  ? ?Other Visit Diagnoses   ? ? Bronchitis      ? Relevant Medications  ? albuterol (PROAIR HFA) 108 (90 Base) MCG/ACT inhaler  ? ?  ?Recheck cholesterol levels today to see if he needs to restart his Crestor.  Discussed positives and negatives of it. ? ?Continue Protonix. ? ?Follow up plan: ?Return in about 6 months (around 08/20/2022), or if symptoms worsen or fail to improve, for Physical exam and cholesterol and GERD. ? ?Counseling provided for all of the vaccine components ?Orders Placed This Encounter  ?Procedures  ? CBC with Differential/Platelet  ? CMP14+EGFR  ? Lipid panel  ? ? ?Nathan Pina, Maldonado ?[place] ?02/17/2022, 2:17 PM ? ? ? ? ?

## 2022-02-18 LAB — CMP14+EGFR
ALT: 31 IU/L (ref 0–44)
AST: 23 IU/L (ref 0–40)
Albumin/Globulin Ratio: 2 (ref 1.2–2.2)
Albumin: 5.1 g/dL — ABNORMAL HIGH (ref 3.8–4.9)
Alkaline Phosphatase: 94 IU/L (ref 44–121)
BUN/Creatinine Ratio: 8 — ABNORMAL LOW (ref 9–20)
BUN: 9 mg/dL (ref 6–24)
Bilirubin Total: 0.5 mg/dL (ref 0.0–1.2)
CO2: 24 mmol/L (ref 20–29)
Calcium: 10 mg/dL (ref 8.7–10.2)
Chloride: 99 mmol/L (ref 96–106)
Creatinine, Ser: 1.13 mg/dL (ref 0.76–1.27)
Globulin, Total: 2.5 g/dL (ref 1.5–4.5)
Glucose: 75 mg/dL (ref 70–99)
Potassium: 4.6 mmol/L (ref 3.5–5.2)
Sodium: 141 mmol/L (ref 134–144)
Total Protein: 7.6 g/dL (ref 6.0–8.5)
eGFR: 75 mL/min/{1.73_m2} (ref >59)

## 2022-02-18 LAB — LIPID PANEL
Chol/HDL Ratio: 5.9 ratio — ABNORMAL HIGH (ref 0.0–5.0)
Cholesterol, Total: 234 mg/dL — ABNORMAL HIGH (ref 100–199)
HDL: 40 mg/dL (ref >39)
LDL Chol Calc (NIH): 157 mg/dL — ABNORMAL HIGH (ref 0–99)
Triglycerides: 203 mg/dL — ABNORMAL HIGH (ref 0–149)
VLDL Cholesterol Cal: 37 mg/dL (ref 5–40)

## 2022-02-18 LAB — CBC WITH DIFFERENTIAL/PLATELET
Basophils Absolute: 0.1 10*3/uL (ref 0.0–0.2)
Basos: 1 %
EOS (ABSOLUTE): 0 10*3/uL (ref 0.0–0.4)
Eos: 0 %
Hematocrit: 49.6 % (ref 37.5–51.0)
Hemoglobin: 16.9 g/dL (ref 13.0–17.7)
Immature Grans (Abs): 0 10*3/uL (ref 0.0–0.1)
Immature Granulocytes: 0 %
Lymphocytes Absolute: 2.3 10*3/uL (ref 0.7–3.1)
Lymphs: 29 %
MCH: 31.3 pg (ref 26.6–33.0)
MCHC: 34.1 g/dL (ref 31.5–35.7)
MCV: 92 fL (ref 79–97)
Monocytes Absolute: 0.8 10*3/uL (ref 0.1–0.9)
Monocytes: 10 %
Neutrophils Absolute: 4.9 10*3/uL (ref 1.4–7.0)
Neutrophils: 60 %
Platelets: 358 10*3/uL (ref 150–450)
RBC: 5.4 x10E6/uL (ref 4.14–5.80)
RDW: 12.9 % (ref 11.6–15.4)
WBC: 8.1 10*3/uL (ref 3.4–10.8)

## 2022-02-25 MED ORDER — ROSUVASTATIN CALCIUM 10 MG PO TABS: 10.0000 mg | ORAL_TABLET | Freq: Every evening | ORAL | 3 refills | Status: DC

## 2022-02-25 NOTE — Addendum Note (Signed)
Addended by: Alphonzo Dublin on: 02/25/2022 11:56 AM ? ? Modules accepted: Orders ? ?

## 2022-03-19 ENCOUNTER — Other Ambulatory Visit: Payer: Self-pay | Admitting: Family Medicine

## 2022-05-17 ENCOUNTER — Telehealth: Payer: Self-pay | Admitting: Family Medicine

## 2022-05-17 NOTE — Telephone Encounter (Signed)
Pt aware FMLA ppw on providers desk for signature

## 2022-05-18 NOTE — Telephone Encounter (Signed)
Aware FMLA faxed this morning & copy at front desk for him to pick up

## 2022-06-09 ENCOUNTER — Telehealth: Payer: Self-pay | Admitting: Family Medicine

## 2022-06-09 NOTE — Telephone Encounter (Signed)
Pt has been informed of Dettinger's recommendations. Pt states that he will wait until his appt in September to discuss.

## 2022-06-09 NOTE — Telephone Encounter (Signed)
It has been too long since we discussed this medication or the reason that he is taking it, he would have to be seen again to restart medicine that has not been prescribed in over a year

## 2022-06-09 NOTE — Telephone Encounter (Signed)
  Prescription Request  06/09/2022  Is this a "Controlled Substance" medicine? gabapentin (NEURONTIN) 300 MG capsule   Have you seen your PCP in the last 2 weeks? No. Pt says that he has been taking this rx everyday and should not be D?C  If YES, route message to pool  -  If NO, patient needs to be scheduled for appointment.  What is the name of the medication or equipment? gabapentin (NEURONTIN) 300 MG capsule   Have you contacted your pharmacy to request a refill? no   Which pharmacy would you like this sent to? [facility]   Patient notified that their request is being sent to the clinical staff for review and that they should receive a response within 2 business days.

## 2022-06-09 NOTE — Telephone Encounter (Signed)
Request for RF on Gabapentin. This medication was DCd at 02/17/22 OV, when request came in in April, I denied with this information. Pt was out of work until April, when he went back to work he started having the foot pain which is why he takes the Gabapentin, he had refills left and was able to start taking them till now. Please advise on refill and if appropriate please send refill to [facility].

## 2022-08-26 ENCOUNTER — Ambulatory Visit: Payer: BC Managed Care – PPO | Admitting: Family Medicine

## 2022-09-03 ENCOUNTER — Ambulatory Visit (INDEPENDENT_AMBULATORY_CARE_PROVIDER_SITE_OTHER): Payer: BC Managed Care – PPO | Admitting: Family Medicine

## 2022-09-03 ENCOUNTER — Encounter: Payer: Self-pay | Admitting: Family Medicine

## 2022-09-03 VITALS — BP 132/85 | HR 86 | Temp 97.5°F | Ht 65.0 in | Wt 196.0 lb

## 2022-09-03 DIAGNOSIS — E782 Mixed hyperlipidemia: Secondary | ICD-10-CM | POA: Diagnosis not present

## 2022-09-03 DIAGNOSIS — Z0001 Encounter for general adult medical examination with abnormal findings: Secondary | ICD-10-CM | POA: Diagnosis not present

## 2022-09-03 DIAGNOSIS — K219 Gastro-esophageal reflux disease without esophagitis: Secondary | ICD-10-CM | POA: Diagnosis not present

## 2022-09-03 DIAGNOSIS — Z125 Encounter for screening for malignant neoplasm of prostate: Secondary | ICD-10-CM

## 2022-09-03 DIAGNOSIS — Z0000 Encounter for general adult medical examination without abnormal findings: Secondary | ICD-10-CM

## 2022-09-03 MED ORDER — PANTOPRAZOLE SODIUM 40 MG PO TBEC: 40.0000 mg | DELAYED_RELEASE_TABLET | Freq: Every day | ORAL | 3 refills | Status: DC

## 2022-09-03 MED ORDER — ROSUVASTATIN CALCIUM 10 MG PO TABS: 10.0000 mg | ORAL_TABLET | Freq: Every evening | ORAL | 3 refills | Status: DC

## 2022-09-03 NOTE — Progress Notes (Signed)
BP 132/85   Pulse 86   Temp (!) 97.5 F (36.4 C)   Ht $R'5\' 5"'WG$  (1.651 m)   Wt 196 lb (88.9 kg)   SpO2 94%   BMI 32.62 kg/m    Subjective:   Patient ID: [name], male    DOB: 1961/03/12, 61 y.o.   MRN: [id number]  HPI: [name] is a 61 y.o. male presenting on 09/03/2022 for Medical Management of Chronic Issues (CPE- no concerns)   HPI Physical exam and recheck of chronic conditions Patient denies any chest pain, shortness of breath, headaches or vision issues, abdominal complaints, diarrhea, nausea, vomiting, or joint issues.   Hyperlipidemia Patient is coming in for recheck of his hyperlipidemia. The patient is currently taking Crestor. They deny any issues with myalgias or history of liver damage from it. They deny any focal numbness or weakness or chest pain.   GERD Patient is currently on pantoprazole.  She denies any major symptoms or abdominal pain or belching or burping. She denies any blood in her stool or lightheadedness or dizziness.   Relevant past medical, surgical, family and social history reviewed and updated as indicated. Interim medical history since our last visit reviewed. Allergies and medications reviewed and updated.  Review of Systems  Constitutional:  Negative for chills and fever.  HENT:  Negative for ear pain and tinnitus.   Eyes:  Negative for pain.  Respiratory:  Negative for cough, shortness of breath and wheezing.   Cardiovascular:  Negative for chest pain, palpitations and leg swelling.  Gastrointestinal:  Negative for abdominal pain, blood in stool, constipation and diarrhea.  Genitourinary:  Negative for dysuria and hematuria.  Musculoskeletal:  Positive for arthralgias. Negative for back pain and myalgias.  Skin:  Negative for rash.  Neurological:  Negative for dizziness, weakness and headaches.  Psychiatric/Behavioral:  Negative for suicidal ideas.     Per HPI unless specifically indicated above   Allergies as of 09/03/2022        Reactions   Morphine And Related Swelling   Bee Venom Rash        Medication List        Accurate as of September 03, 2022  1:29 PM. If you have any questions, ask your nurse or doctor.          albuterol 108 (90 Base) MCG/ACT inhaler Commonly known as: ProAir HFA USE 2 PUFFS EVERY 6 HOURS AS NEEDED   pantoprazole 40 MG tablet Commonly known as: PROTONIX Take 1 tablet (40 mg total) by mouth daily.   rosuvastatin 10 MG tablet Commonly known as: Crestor Take 1 tablet (10 mg total) by mouth at bedtime.         Objective:   BP 132/85   Pulse 86   Temp (!) 97.5 F (36.4 C)   Ht $R'5\' 5"'cL$  (1.651 m)   Wt 196 lb (88.9 kg)   SpO2 94%   BMI 32.62 kg/m   Wt Readings from Last 3 Encounters:  09/03/22 196 lb (88.9 kg)  02/17/22 198 lb (89.8 kg)  11/19/21 191 lb (86.6 kg)    Physical Exam Vitals reviewed.  Constitutional:      General: He is not in acute distress.    Appearance: He is well-developed. He is not diaphoretic.  HENT:     Right Ear: External ear normal.     Left Ear: External ear normal.     Nose: Nose normal.     Mouth/Throat:  Pharynx: No oropharyngeal exudate.  Eyes:     General: No scleral icterus.    Conjunctiva/sclera: Conjunctivae normal.  Neck:     Thyroid: No thyromegaly.  Cardiovascular:     Rate and Rhythm: Normal rate and regular rhythm.     Heart sounds: Normal heart sounds. No murmur heard. Pulmonary:     Effort: Pulmonary effort is normal. No respiratory distress.     Breath sounds: Normal breath sounds. No wheezing.  Abdominal:     General: Bowel sounds are normal. There is no distension.     Palpations: Abdomen is soft.     Tenderness: There is no abdominal tenderness. There is no guarding or rebound.  Musculoskeletal:        General: No swelling. Normal range of motion.     Cervical back: Neck supple.  Lymphadenopathy:     Cervical: No cervical adenopathy.  Skin:    General: Skin is warm and dry.     Findings:  No rash.  Neurological:     Mental Status: He is alert and oriented to person, place, and time.     Coordination: Coordination normal.  Psychiatric:        Behavior: Behavior normal.       Assessment & Plan:   Problem List Items Addressed This Visit       Digestive   Esophageal reflux   Relevant Medications   pantoprazole (PROTONIX) 40 MG tablet   Other Relevant Orders   CBC with Differential/Platelet   CMP14+EGFR     Other   Mixed hyperlipidemia   Relevant Medications   rosuvastatin (CRESTOR) 10 MG tablet   Other Relevant Orders   CMP14+EGFR   Lipid panel   Other Visit Diagnoses     Physical exam    -  Primary   Relevant Orders   CBC with Differential/Platelet   CMP14+EGFR   Lipid panel   PSA, total and free   Prostate cancer screening       Relevant Orders   PSA, total and free     Seems to be doing well, will do blood work today to see how his cholesterol is    Follow up plan: Return in about 6 months (around 03/04/2023), or if symptoms worsen or fail to improve, for Cholesterol recheck.  Counseling provided for all of the vaccine components Orders Placed This Encounter  Procedures   CBC with Differential/Platelet   CMP14+EGFR   Lipid panel   PSA, total and free    Caryl Pina, MD [facility] 09/03/2022, 1:29 PM

## 2022-09-04 LAB — CBC WITH DIFFERENTIAL/PLATELET
Basophils Absolute: 0 10*3/uL (ref 0.0–0.2)
Basos: 1 %
EOS (ABSOLUTE): 0.1 10*3/uL (ref 0.0–0.4)
Eos: 1 %
Hematocrit: 44 % (ref 37.5–51.0)
Hemoglobin: 14.9 g/dL (ref 13.0–17.7)
Immature Grans (Abs): 0 10*3/uL (ref 0.0–0.1)
Immature Granulocytes: 0 %
Lymphocytes Absolute: 1.8 10*3/uL (ref 0.7–3.1)
Lymphs: 23 %
MCH: 30.3 pg (ref 26.6–33.0)
MCHC: 33.9 g/dL (ref 31.5–35.7)
MCV: 89 fL (ref 79–97)
Monocytes Absolute: 0.6 10*3/uL (ref 0.1–0.9)
Monocytes: 8 %
Neutrophils Absolute: 5.3 10*3/uL (ref 1.4–7.0)
Neutrophils: 67 %
Platelets: 285 10*3/uL (ref 150–450)
RBC: 4.92 x10E6/uL (ref 4.14–5.80)
RDW: 13.2 % (ref 11.6–15.4)
WBC: 7.8 10*3/uL (ref 3.4–10.8)

## 2022-09-04 LAB — CMP14+EGFR
ALT: 20 IU/L (ref 0–44)
AST: 16 IU/L (ref 0–40)
Albumin/Globulin Ratio: 1.7 (ref 1.2–2.2)
Albumin: 4.5 g/dL (ref 3.8–4.9)
Alkaline Phosphatase: 91 IU/L (ref 44–121)
BUN/Creatinine Ratio: 12 (ref 9–20)
BUN: 13 mg/dL (ref 6–24)
Bilirubin Total: 0.6 mg/dL (ref 0.0–1.2)
CO2: 25 mmol/L (ref 20–29)
Calcium: 9.4 mg/dL (ref 8.7–10.2)
Chloride: 100 mmol/L (ref 96–106)
Creatinine, Ser: 1.1 mg/dL (ref 0.76–1.27)
Globulin, Total: 2.6 g/dL (ref 1.5–4.5)
Glucose: 106 mg/dL — ABNORMAL HIGH (ref 70–99)
Potassium: 4.2 mmol/L (ref 3.5–5.2)
Sodium: 140 mmol/L (ref 134–144)
Total Protein: 7.1 g/dL (ref 6.0–8.5)
eGFR: 77 mL/min/{1.73_m2} (ref >59)

## 2022-09-04 LAB — LIPID PANEL
Chol/HDL Ratio: 6.3 ratio — ABNORMAL HIGH (ref 0.0–5.0)
Cholesterol, Total: 209 mg/dL — ABNORMAL HIGH (ref 100–199)
HDL: 33 mg/dL — ABNORMAL LOW (ref >39)
LDL Chol Calc (NIH): 143 mg/dL — ABNORMAL HIGH (ref 0–99)
Triglycerides: 184 mg/dL — ABNORMAL HIGH (ref 0–149)
VLDL Cholesterol Cal: 33 mg/dL (ref 5–40)

## 2022-09-04 LAB — PSA, TOTAL AND FREE
PSA, Free Pct: 36.3 %
PSA, Free: 0.29 ng/mL
Prostate Specific Ag, Serum: 0.8 ng/mL (ref 0.0–4.0)

## 2022-09-15 ENCOUNTER — Other Ambulatory Visit: Payer: Self-pay

## 2022-09-15 MED ORDER — ROSUVASTATIN CALCIUM 20 MG PO TABS: 20.0000 mg | ORAL_TABLET | Freq: Every day | ORAL | 1 refills | Status: DC

## 2022-12-24 ENCOUNTER — Other Ambulatory Visit: Payer: Self-pay | Admitting: Family Medicine

## 2022-12-30 ENCOUNTER — Encounter: Payer: Self-pay | Admitting: Family Medicine

## 2022-12-30 ENCOUNTER — Ambulatory Visit: Payer: BC Managed Care – PPO | Admitting: Family Medicine

## 2022-12-30 VITALS — BP 124/87 | HR 104 | Temp 97.3°F | Ht 65.0 in | Wt 189.5 lb

## 2022-12-30 DIAGNOSIS — J069 Acute upper respiratory infection, unspecified: Secondary | ICD-10-CM | POA: Diagnosis not present

## 2022-12-30 NOTE — Progress Notes (Signed)
Acute Office Visit  Subjective:     Patient ID: [name], male    DOB: January 23, 1961, 62 y.o.   MRN: [id number]  Chief Complaint  Patient presents with   Cough   Headache    Cough This is a new problem. Episode onset: 3 days. The problem has been rapidly improving. Associated symptoms include ear congestion, headaches, myalgias, rhinorrhea and a sore throat. Pertinent negatives include no chest pain, chills, ear pain, fever, nasal congestion, shortness of breath or wheezing. Associated symptoms comments: fatigue. Treatments tried: mucinex, moonshine. The treatment provided moderate relief. His past medical history is significant for bronchitis and pneumonia. There is no history of asthma or COPD.     Review of Systems  Constitutional:  Negative for chills and fever.  HENT:  Positive for rhinorrhea and sore throat. Negative for ear pain.   Respiratory:  Positive for cough. Negative for shortness of breath and wheezing.   Cardiovascular:  Negative for chest pain.  Musculoskeletal:  Positive for myalgias.  Neurological:  Positive for headaches.        Objective:    BP 124/87   Pulse (!) 104   Temp (!) 97.3 F (36.3 C) (Temporal)   Ht '5\' 5"'$  (1.651 m)   Wt 189 lb 8 oz (86 kg)   SpO2 94%   BMI 31.53 kg/m    Physical Exam Vitals and nursing note reviewed.  Constitutional:      General: He is not in acute distress.    Appearance: He is well-developed. He is not ill-appearing, toxic-appearing or diaphoretic.  HENT:     Head: Normocephalic and atraumatic.     Right Ear: Tympanic membrane, ear canal and external ear normal.     Left Ear: Tympanic membrane, ear canal and external ear normal.     Nose: Congestion present.     Mouth/Throat:     Mouth: Mucous membranes are moist.     Pharynx: Oropharynx is clear. No oropharyngeal exudate or posterior oropharyngeal erythema.  Eyes:     General:        Right eye: No discharge.        Left eye: No discharge.      Conjunctiva/sclera: Conjunctivae normal.  Cardiovascular:     Rate and Rhythm: Normal rate and regular rhythm.     Heart sounds: Normal heart sounds. No murmur heard. Pulmonary:     Effort: Pulmonary effort is normal. No respiratory distress.     Breath sounds: Normal breath sounds. No wheezing, rhonchi or rales.  Musculoskeletal:     Cervical back: Neck supple. No rigidity.     Right lower leg: No edema.     Left lower leg: No edema.  Lymphadenopathy:     Cervical: No cervical adenopathy.  Skin:    General: Skin is warm and dry.  Neurological:     General: No focal deficit present.     Mental Status: He is alert and oriented to person, place, and time.  Psychiatric:        Mood and Affect: Mood normal.        Behavior: Behavior normal.     No results found for any visits on 12/30/22.      Assessment & Plan:   [name] was seen today for cough and headache.  Diagnoses and all orders for this visit:  Viral URI with cough Symptoms x 3 days, improving. Benign exam with clear lungs today. Covid/flu/RSV swab pending. Will notify patient of results when  available. Discussed symptomatic care and return precautions.  -     COVID-19, Flu A+B and RSV  The patient indicates understanding of these issues and agrees with the plan.  Gwenlyn Perking, FNP

## 2023-01-03 LAB — COVID-19, FLU A+B AND RSV
Influenza A, NAA: NOT DETECTED
Influenza B, NAA: NOT DETECTED
RSV, NAA: NOT DETECTED
SARS-CoV-2, NAA: NOT DETECTED

## 2023-01-26 ENCOUNTER — Other Ambulatory Visit: Payer: Self-pay

## 2023-01-26 ENCOUNTER — Emergency Department (HOSPITAL_COMMUNITY): Payer: BC Managed Care – PPO

## 2023-01-26 ENCOUNTER — Emergency Department (HOSPITAL_COMMUNITY)
Admission: EM | Admit: 2023-01-26 | Discharge: 2023-01-26 | Disposition: A | Discharge status: Home / Self Care | Payer: BC Managed Care – PPO | Attending: Emergency Medicine | Admitting: Emergency Medicine

## 2023-01-26 ENCOUNTER — Encounter (HOSPITAL_COMMUNITY): Payer: Self-pay | Admitting: Emergency Medicine

## 2023-01-26 DIAGNOSIS — R1013 Epigastric pain: Secondary | ICD-10-CM | POA: Diagnosis present

## 2023-01-26 DIAGNOSIS — K30 Functional dyspepsia: Secondary | ICD-10-CM | POA: Diagnosis not present

## 2023-01-26 LAB — COMPREHENSIVE METABOLIC PANEL
ALT: 20 U/L (ref 0–44)
AST: 22 U/L (ref 15–41)
Albumin: 4.6 g/dL (ref 3.5–5.0)
Alkaline Phosphatase: 79 U/L (ref 38–126)
Anion gap: 11 (ref 5–15)
BUN: 16 mg/dL (ref 6–20)
CO2: 26 mmol/L (ref 22–32)
Calcium: 9.8 mg/dL (ref 8.9–10.3)
Chloride: 100 mmol/L (ref 98–111)
Creatinine, Ser: 1.17 mg/dL (ref 0.61–1.24)
GFR, Estimated: >60 mL/min (ref >60)
Glucose, Bld: 112 mg/dL — ABNORMAL HIGH (ref 70–99)
Potassium: 3.9 mmol/L (ref 3.5–5.1)
Sodium: 137 mmol/L (ref 135–145)
Total Bilirubin: 0.7 mg/dL (ref 0.3–1.2)
Total Protein: 8.1 g/dL (ref 6.5–8.1)

## 2023-01-26 LAB — URINALYSIS, MICROSCOPIC (REFLEX)
Bacteria, UA: NONE SEEN
Squamous Epithelial / HPF: NONE SEEN /HPF (ref 0–5)
WBC, UA: NONE SEEN WBC/hpf (ref 0–5)

## 2023-01-26 LAB — CBC
HCT: 49 % (ref 39.0–52.0)
Hemoglobin: 16.6 g/dL (ref 13.0–17.0)
MCH: 30.8 pg (ref 26.0–34.0)
MCHC: 33.9 g/dL (ref 30.0–36.0)
MCV: 90.9 fL (ref 80.0–100.0)
Platelets: 285 10*3/uL (ref 150–400)
RBC: 5.39 MIL/uL (ref 4.22–5.81)
RDW: 14 % (ref 11.5–15.5)
WBC: 13 10*3/uL — ABNORMAL HIGH (ref 4.0–10.5)
nRBC: 0 % (ref 0.0–0.2)

## 2023-01-26 LAB — URINALYSIS, ROUTINE W REFLEX MICROSCOPIC
Bilirubin Urine: NEGATIVE
Glucose, UA: NEGATIVE mg/dL
Ketones, ur: NEGATIVE mg/dL
Leukocytes,Ua: NEGATIVE
Nitrite: NEGATIVE
Protein, ur: NEGATIVE mg/dL
Specific Gravity, Urine: 1.01 (ref 1.005–1.030)
pH: 6.5 (ref 5.0–8.0)

## 2023-01-26 LAB — LIPASE, BLOOD: Lipase: 37 U/L (ref 11–51)

## 2023-01-26 MED ORDER — FAMOTIDINE 20 MG PO TABS
20.0000 mg | ORAL_TABLET | Freq: Once | ORAL | Status: AC
  Administered 2023-01-26: 20 mg via ORAL
  Filled 2023-01-26: qty 1

## 2023-01-26 MED ORDER — SUCRALFATE 1 GM/10ML PO SUSP: 1.0000 g | Freq: Three times a day (TID) | ORAL | 0 refills | Status: DC

## 2023-01-26 MED ORDER — FAMOTIDINE 20 MG PO TABS: 20.0000 mg | ORAL_TABLET | Freq: Two times a day (BID) | ORAL | 0 refills | Status: DC

## 2023-01-26 MED ORDER — ALUM & MAG HYDROXIDE-SIMETH 200-200-20 MG/5ML PO SUSP
30.0000 mL | Freq: Once | ORAL | Status: AC
  Administered 2023-01-26: 30 mL via ORAL
  Filled 2023-01-26: qty 30

## 2023-01-26 MED ORDER — LIDOCAINE VISCOUS HCL 2 % MT SOLN
15.0000 mL | Freq: Once | OROMUCOSAL | Status: AC
  Administered 2023-01-26: 15 mL via ORAL
  Filled 2023-01-26: qty 15

## 2023-01-26 MED ORDER — IOHEXOL 300 MG/ML  SOLN
100.0000 mL | Freq: Once | INTRAMUSCULAR | Status: AC | PRN
  Administered 2023-01-26: 100 mL via INTRAVENOUS

## 2023-01-26 MED ORDER — PANTOPRAZOLE SODIUM 40 MG IV SOLR
40.0000 mg | Freq: Once | INTRAVENOUS | Status: AC
  Administered 2023-01-26: 40 mg via INTRAVENOUS
  Filled 2023-01-26: qty 10

## 2023-01-26 NOTE — ED Triage Notes (Signed)
Pt with c/o upper abdominal pain and burning along with emesis that started after eating dinner last night.

## 2023-01-26 NOTE — ED Provider Notes (Signed)
[facility] Provider Note   CSN: [id number] Arrival date & time: 01/26/23  0124     History  Chief Complaint  Patient presents with   Abdominal Pain    [name] is a 62 y.o. male.  62 year old male with history of hyperlipidemia and indigestion who presents the ER today secondary to abdominal pain and emesis.  Patient states that he had some burning and a feeling of fullness in his epigastric region earlier tonight and had a couple episodes of nonbloody nonbilious emesis.  He felt slightly unwell with just mild nausea but went to work.  Had a few bites of a hamburger around 9:00 and then went to lunch at midnight and states just did not feel well enough to eat.  Later on started having severe abdominal cramping and burning again with nausea unlike in the [place] before so he presents here for further evaluation.  States is epigastric and right upper quadrant area.  No fevers.  No history of gallbladder problems.   Abdominal Pain      Home Medications Prior to Admission medications   Medication Sig Start Date End Date Taking? Authorizing Provider  famotidine (PEPCID) 20 MG tablet Take 1 tablet (20 mg total) by mouth 2 (two) times daily. 01/26/23  Yes Isai Gottlieb, Corene Cornea, MD  sucralfate (CARAFATE) 1 GM/10ML suspension Take 10 mLs (1 g total) by mouth 4 (four) times daily -  with meals and at bedtime. 01/26/23  Yes Mahasin Riviere, Corene Cornea, MD  albuterol (PROAIR HFA) 108 (90 Base) MCG/ACT inhaler USE 2 PUFFS EVERY 6 HOURS AS NEEDED 02/17/22   Dettinger, [name], MD  pantoprazole (PROTONIX) 40 MG tablet Take 1 tablet (40 mg total) by mouth daily. 09/03/22   Dettinger, [name], MD  rosuvastatin (CRESTOR) 20 MG tablet Take 1 tablet (20 mg total) by mouth daily. 09/15/22   Dettinger, [name], MD      Allergies    Morphine and related and Bee venom    Review of Systems   Review of Systems  Gastrointestinal:  Positive for abdominal pain.    Physical  Exam Updated Vital Signs BP 123/85   Pulse 71   Temp 98.7 F (37.1 C) (Oral)   Resp 17   Ht 5' 5"$  (1.651 m)   Wt 83.9 kg   SpO2 96%   BMI 30.79 kg/m  Physical Exam Vitals and nursing note reviewed.  Constitutional:      Appearance: He is well-developed.  HENT:     Head: Normocephalic and atraumatic.  Cardiovascular:     Rate and Rhythm: Normal rate.  Pulmonary:     Effort: Pulmonary effort is normal. No respiratory distress.  Abdominal:     General: There is no distension.     Tenderness: There is abdominal tenderness in the right upper quadrant.  Musculoskeletal:        General: Normal range of motion.     Cervical back: Normal range of motion.  Skin:    General: Skin is warm and dry.  Neurological:     Mental Status: He is alert.     ED Results / Procedures / Treatments   Labs (all labs ordered are listed, but only abnormal results are displayed) Labs Reviewed  COMPREHENSIVE METABOLIC PANEL - Abnormal; Notable for the following components:      Result Value   Glucose, Bld 112 (*)    All other components within normal limits  CBC - Abnormal; Notable for the  following components:   WBC 13.0 (*)    All other components within normal limits  URINALYSIS, ROUTINE W REFLEX MICROSCOPIC - Abnormal; Notable for the following components:   Hgb urine dipstick TRACE (*)    All other components within normal limits  LIPASE, BLOOD  URINALYSIS, MICROSCOPIC (REFLEX)    EKG EKG Interpretation  Date/Time:  Wednesday January 26 2023 02:47:02 EST Ventricular Rate:  81 PR Interval:  141 QRS Duration: 81 QT Interval:  372 QTC Calculation: 432 R Axis:   51 Text Interpretation: Sinus rhythm Confirmed by Merrily Pew [phone]) on 01/26/2023 3:35:15 AM  Radiology CT ABDOMEN PELVIS W CONTRAST  Result Date: 01/26/2023 CLINICAL DATA:  62 year old male with abdominal pain and vomiting after eating last night. EXAM: CT ABDOMEN AND PELVIS WITH CONTRAST TECHNIQUE: Multidetector CT  imaging of the abdomen and pelvis was performed using the standard protocol following bolus administration of intravenous contrast. RADIATION DOSE REDUCTION: This exam was performed according to the departmental dose-optimization program which includes automated exposure control, adjustment of the mA and/or kV according to patient size and/or use of iterative reconstruction technique. CONTRAST:  138m OMNIPAQUE IOHEXOL 300 MG/ML  SOLN COMPARISON:  Noncontrast CT Abdomen and Pelvis 11/07/2014. FINDINGS: Lower chest: Negative. Small fat containing hiatal hernia. No pericardial or pleural effusion. Hepatobiliary: Partially contracted gallbladder. Negative liver. No bile duct enlargement. Pancreas: Negative. Spleen: Negative. Adrenals/Urinary Tract: Normal adrenal glands. Kidneys appears symmetric and normal, with symmetric contrast excretion on the delayed images. No nephrolithiasis or evidence of renal inflammation. Negative ureters. Unremarkable bladder. Occasional pelvic phleboliths. Stomach/Bowel: Extensive diverticulosis of the distal descending colon and proximal sigmoid, but no active inflammation. Decompressed large bowel from the transverse colon distally. Negative right colon. Cecum located in the midline with normal appendix on series 2, image 52. Decompressed terminal ileum. No dilated small bowel. Heterogeneous appearance of the stomach probably reflects retained food debris. Nondilated stomach and duodenum. No gastro duodenal inflammation identified. Small distal duodenum diverticulum on series 2, image 34. No free air or free fluid. Vascular/Lymphatic: Normal caliber abdominal aorta. Mild calcified iliac artery atherosclerosis. Major arterial structures and the portal venous system are patent. No lymphadenopathy identified. Reproductive: Chronic fat containing left inguinal hernia appears stable since 2015, noninflamed. Other: No pelvis free fluid. Musculoskeletal: Lower thoracic disc degeneration with  vacuum disc. Left hip arthroplasty is new since 2015. No acute osseous abnormality identified. IMPRESSION: 1. No acute or inflammatory process identified in the abdomen or pelvis. 2. Normal appendix. Diverticulosis of the distal colon without active inflammation. Small fat containing hiatal and left inguinal hernias. Electronically Signed   By: HGenevie AnnM.D.   On: 01/26/2023 05:10    Procedures Procedures    Medications Ordered in ED Medications  pantoprazole (PROTONIX) injection 40 mg (40 mg Intravenous Given 01/26/23 0335)  alum & mag hydroxide-simeth (MAALOX/MYLANTA) 200-200-20 MG/5ML suspension 30 mL (30 mLs Oral Given 01/26/23 0335)    And  lidocaine (XYLOCAINE) 2 % viscous mouth solution 15 mL (15 mLs Oral Given 01/26/23 0335)  iohexol (OMNIPAQUE) 300 MG/ML solution 100 mL (100 mLs Intravenous Contrast Given 01/26/23 0411)  famotidine (PEPCID) tablet 20 mg (20 mg Oral Given 01/26/23 0601)    ED Course/ Medical Decision Making/ A&P                             Medical Decision Making Amount and/or Complexity of Data Reviewed Labs: ordered. Radiology: ordered.  Risk OTC drugs. Prescription drug  management.  Digestion/gastritis versus cholecystitis versus pancreatitis.  Will evaluate for the same with labs and CT scan.  Will treat symptomatically in the meantime. CT reassuring. Symptoms improved. Will d/c on symptomatic treatment.   Final Clinical Impression(s) / ED Diagnoses Final diagnoses:  Indigestion    Rx / DC Orders ED Discharge Orders          Ordered    famotidine (PEPCID) 20 MG tablet  2 times daily        01/26/23 0554    sucralfate (CARAFATE) 1 GM/10ML suspension  3 times daily with meals & bedtime        01/26/23 0554              Oni Dietzman, Corene Cornea, MD 01/26/23 [phone]

## 2023-01-27 ENCOUNTER — Telehealth: Payer: Self-pay

## 2023-01-27 NOTE — Transitions of Care (Post Inpatient/ED Visit) (Signed)
   01/27/2023  Name: [name] MRN: [id number] DOB: [birth year]  Today's TOC FU Call Status: Today's TOC FU Call Status:: Unsuccessul Call (1st Attempt) Unsuccessful Call (1st Attempt) Date: 01/27/23  Attempted to reach the patient regarding the most recent Inpatient/ED visit.  Follow Up Plan: Additional outreach attempts will be made to reach the patient to complete the Transitions of Care (Post Inpatient/ED visit) call.   Signature Juanda Crumble, [place] Direct Dial [phone]

## 2023-01-28 NOTE — Transitions of Care (Post Inpatient/ED Visit) (Signed)
   01/28/2023  Name: [name] MRN: [id number] DOB: [birth year]  Today's TOC FU Call Status: Today's TOC FU Call Status:: Unsuccessful Call (2nd Attempt) Unsuccessful Call (1st Attempt) Date: 01/27/23 Unsuccessful Call (2nd Attempt) Date: 01/28/23  Attempted to reach the patient regarding the most recent Inpatient/ED visit.  Follow Up Plan: No further outreach attempts will be made at this time. We have been unable to contact the patient.  Signature Juanda Crumble, [place] Direct Dial [phone]

## 2023-01-31 ENCOUNTER — Ambulatory Visit: Payer: BC Managed Care – PPO | Admitting: Family Medicine

## 2023-01-31 ENCOUNTER — Encounter: Payer: Self-pay | Admitting: Family Medicine

## 2023-01-31 VITALS — BP 129/88 | HR 80 | Temp 98.4°F | Ht 65.0 in | Wt 189.0 lb

## 2023-01-31 DIAGNOSIS — F430 Acute stress reaction: Secondary | ICD-10-CM

## 2023-01-31 DIAGNOSIS — G479 Sleep disorder, unspecified: Secondary | ICD-10-CM | POA: Diagnosis not present

## 2023-01-31 DIAGNOSIS — F419 Anxiety disorder, unspecified: Secondary | ICD-10-CM | POA: Diagnosis not present

## 2023-01-31 DIAGNOSIS — K219 Gastro-esophageal reflux disease without esophagitis: Secondary | ICD-10-CM

## 2023-01-31 MED ORDER — MIRTAZAPINE 7.5 MG PO TABS: 7.5000 mg | ORAL_TABLET | Freq: Every day | ORAL | 0 refills | Status: DC

## 2023-01-31 MED ORDER — FAMOTIDINE 20 MG PO TABS: 20.0000 mg | ORAL_TABLET | Freq: Two times a day (BID) | ORAL | 3 refills | Status: DC | PRN

## 2023-01-31 NOTE — Progress Notes (Signed)
Subjective: CC:ER follow up PCP: [name], Nathan Kaufmann, MD [id number] [name] is a 62 y.o. male presenting to clinic today for:  1. Indigestion Seen in ER.  Discharged on carafate and pepcid.  Takes protonix at baseline. Reports symptoms have been better with the Pepcid.  He was not able to get the Carafate because insurance did not cover it was several $100.  He has been on Prevacid, Nexium previously but insurance would not cover and he was subsequently put on Protonix many years ago.  He had been stable up until recently.  He denies any nausea, vomiting, abdominal pain, rectal bleeding.  Bowel movements have been baseline.  He does report increased stress and he wonders if this is adding to GERD.  Over the last 3 to 4 months he has been working roughly 7 days/week as his wife is unemployed currently.  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  He reports poor sleep and on average only getting a couple of hours per night.  He does work third shift from 7 PM to 7 AM.  When he goes to bed at 7:30 AM he rises around lunchtime, noon.   ROS: Per HPI  Allergies  Allergen Reactions   Morphine And Related Swelling   Bee Venom Rash   Past Medical History:  Diagnosis Date   Allergy    Arthritis    Calcaneus fracture, left    Cancer (HCC)    Skin cancer Left Arm   GERD (gastroesophageal reflux disease)    Small bowel obstruction (HCC)     Current Outpatient Medications:    albuterol (PROAIR HFA) 108 (90 Base) MCG/ACT inhaler, USE 2 PUFFS EVERY 6 HOURS AS NEEDED, Disp: 8.5 g, Rfl: 3   famotidine (PEPCID) 20 MG tablet, Take 1 tablet (20 mg total) by mouth 2 (two) times daily., Disp: 10 tablet, Rfl: 0   pantoprazole (PROTONIX) 40 MG tablet, Take 1 tablet (40 mg total) by mouth daily., Disp: 90 tablet, Rfl: 3   rosuvastatin (CRESTOR) 20 MG tablet, Take 1 tablet (20 mg total) by mouth daily., Disp: 90 tablet, Rfl: 1   sucralfate (CARAFATE) 1 GM/10ML suspension, Take 10 mLs (1 g  total) by mouth 4 (four) times daily -  with meals and at bedtime., Disp: 420 mL, Rfl: 0 Social History   Socioeconomic History   Marital status: Married    Spouse name: Not on file   Number of children: Not on file   Years of education: Not on file   Highest education level: Not on file  Occupational History   Not on file  Tobacco Use   Smoking status: Never   Smokeless tobacco: Never  Vaping Use   Vaping Use: Never used  Substance and Sexual Activity   Alcohol use: Yes    Alcohol/week: 4.0 standard drinks of alcohol    Types: 4 Cans of beer per week    Comment: occ   Drug use: No   Sexual activity: Yes    Birth control/protection: None    Comment: married 2005  Other Topics Concern   Not on file  Social History Narrative   Not on file   Social Determinants of Health   Financial Resource Strain: Not on file  Food Insecurity: Not on file  Transportation Needs: Not on file  Physical Activity: Not on file  Stress: Not on file  Social Connections: Not on file  Intimate Partner Violence: Not on file   Family History  Problem Relation Age of Onset   Stroke Mother    Cancer Mother        colon   Alzheimer's disease Father        23 years   Heart disease Father        atrial fibri   Heart disease Sister    Early death Brother    Cancer Maternal Grandmother     Objective: Office vital signs reviewed. BP 129/88   Pulse 80   Temp 98.4 F (36.9 C)   Ht '5\' 5"'$  (1.651 m)   Wt 189 lb (85.7 kg)   SpO2 95%   BMI 31.45 kg/m   Physical Examination:  General: Awake, alert, nontoxic-appearing male, No acute distress HEENT: sclera white Cardio: regular rate and rhythm, S1S2 heard, no murmurs appreciated Pulm: clear to auscultation bilaterally, no wheezes, rhonchi or rales; normal work of breathing on room air GI: soft, non-tender, non-distended, bowel sounds present x4, no hepatomegaly, no splenomegaly, no masses Psych: Mood stable, speech normal, affect appropriate.   Very pleasant and interactive male     01/31/2023   10:27 AM 01/31/2023   10:20 AM 12/30/2022    9:47 AM  Depression screen PHQ 2/9  Decreased Interest '3 1 2  '$ Down, Depressed, Hopeless '3 1 1  '$ PHQ - 2 Score '6 2 3  '$ Altered sleeping 2  0  Tired, decreased energy '1 1 2  '$ Change in appetite 2 0 0  Feeling bad or failure about yourself  3 0 1  Trouble concentrating 0 0 0  Moving slowly or fidgety/restless 0 0 0  Suicidal thoughts 2 0 0  PHQ-9 Score 16  6  Difficult doing work/chores Somewhat difficult Somewhat difficult Somewhat difficult      01/31/2023   10:27 AM 01/31/2023   10:20 AM 12/30/2022    9:48 AM 09/03/2022    1:09 PM  GAD 7 : Generalized Anxiety Score  Nervous, Anxious, on Edge 3 0 0 0  Control/stop worrying '3 1 2 '$ 0  Worry too much - different things 3 0 2 1  Trouble relaxing 3 0 1 3  Restless 3 0 0 0  Easily annoyed or irritable 2 0 1 1  Afraid - awful might happen 3 1 0 0  Total GAD 7 Score '20 2 6 5  '$ Anxiety Difficulty  Somewhat difficult Not difficult at all Not difficult at all    Assessment/ Plan: 62 y.o. male   Gastroesophageal reflux disease without esophagitis - Plan: famotidine (PEPCID) 20 MG tablet  Stress reaction - Plan: mirtazapine (REMERON) 7.5 MG tablet  Anxiety - Plan: mirtazapine (REMERON) 7.5 MG tablet  Sleep difficulties - Plan: mirtazapine (REMERON) 7.5 MG tablet  Physical exam was unrevealing.  No abdominal tenderness or other concerning features.  Pepcid continued since this has been helpful.  He will continue Protonix at current dose.  Mirtazapine 7.5 mg q. bedtime started for anxiety, sleep difficulties mirtazapine 7.5 mg q. bedtime started for anxiety, sleep difficulties.  He will follow-up closely with his PCP in April.  I did offer him sooner appointment but he was fine with waiting.  Care instructions reviewed and reasons for reevaluation discussed.  AVS provided with emergency numbers if needed  No orders of the defined types were  placed in this encounter.  No orders of the defined types were placed in this encounter.    Janora Norlander, DO [place] [phone]

## 2023-01-31 NOTE — Patient Instructions (Signed)
Taking the medicine as directed and not missing any doses is one of the best things you can do to treat your anxiety/ sleep.  Here are some things to keep in mind:  Side effects (stomach upset, some increased anxiety) may happen before you notice a benefit.  These side effects typically go away over time. Changes to your dose of medicine or a change in medication all together is sometimes necessary Most people need to be on medication at least 12 months Many people will notice an improvement within two weeks but the full effect of the medication can take up to 4-6 weeks Stopping the medication when you start feeling better often results in a return of symptoms Never discontinue your medication without contacting a health care professional first.  Some medications require gradual discontinuation/ taper and can make you sick if you stop them abruptly.  If your symptoms worsen or you have thoughts of suicide/homicide, PLEASE SEEK IMMEDIATE MEDICAL ATTENTION.  You may always call:  National Suicide Hotline: 819-601-9314 [place]: (279) 750-3664 Crisis Recovery in [place]: [phone]   These are available 24 hours a day, 7 days a week.

## 2023-02-03 ENCOUNTER — Encounter: Payer: Self-pay | Admitting: Radiology

## 2023-02-07 ENCOUNTER — Telehealth (INDEPENDENT_AMBULATORY_CARE_PROVIDER_SITE_OTHER): Payer: BC Managed Care – PPO | Admitting: Nurse Practitioner

## 2023-02-07 ENCOUNTER — Encounter: Payer: Self-pay | Admitting: Nurse Practitioner

## 2023-02-07 DIAGNOSIS — J069 Acute upper respiratory infection, unspecified: Secondary | ICD-10-CM

## 2023-02-07 MED ORDER — BENZONATATE 100 MG PO CAPS: 100.0000 mg | ORAL_CAPSULE | Freq: Three times a day (TID) | ORAL | 0 refills | Status: DC | PRN

## 2023-02-07 MED ORDER — FLUTICASONE PROPIONATE 50 MCG/ACT NA SUSP: 2.0000 | Freq: Every day | NASAL | 6 refills | Status: DC

## 2023-02-07 NOTE — Progress Notes (Signed)
Virtual Visit Consent   [name], you are scheduled for a virtual visit with Nathan Hassell Done, FNP, a [facility] provider, today.     Just as with appointments in the office, your consent must be obtained to participate.  Your consent will be active for this visit and any virtual visit you may have with one of our providers in the next 365 days.     If you have a MyChart account, a copy of this consent can be sent to you electronically.  All virtual visits are billed to your insurance company just like a traditional visit in the office.    As this is a virtual visit, video technology does not allow for your provider to perform a traditional examination.  This may limit your provider's ability to fully assess your condition.  If your provider identifies any concerns that need to be evaluated in person or the need to arrange testing (such as labs, EKG, etc.), we will make arrangements to do so.     Although advances in technology are sophisticated, we cannot ensure that it will always work on either your end or our end.  If the connection with a video visit is poor, the visit may have to be switched to a telephone visit.  With either a video or telephone visit, we are not always able to ensure that we have a secure connection.     I need to obtain your verbal consent now.   Are you willing to proceed with your visit today? YES   [name] has provided verbal consent on 02/07/2023 for a virtual visit (video or telephone).   Nathan Hassell Done, FNP   Date: 02/07/2023 2:11 PM   Virtual Visit via Video Note   I, [name], connected with [name] ([id number], 25-Jul-1961) on 02/07/23 at  6:15 PM EST by a video-enabled telemedicine application and verified that I am speaking with the correct person using two identifiers.  Location: Patient: Virtual Visit Location Patient: Home Provider: Virtual Visit Location Provider: Mobile   I discussed the limitations of  evaluation and management by telemedicine and the availability of in person appointments. The patient expressed understanding and agreed to proceed.    History of Present Illness: [name] is a 62 y.o. who identifies as a male who was assigned male at birth, and is being seen today for sinusitis .  HPI: Sinusitis This is a new problem. The current episode started in the past 7 days. The problem has been gradually improving since onset. There has been no fever. His pain is at a severity of 0/10. He is experiencing no pain. Associated symptoms include congestion and coughing. Pertinent negatives include no headaches or sore throat. Treatments tried: sudafed. The treatment provided mild relief.    Review of Systems  HENT:  Positive for congestion. Negative for sore throat.   Respiratory:  Positive for cough.   Neurological:  Negative for headaches.    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   Esophageal reflux 10/29/2014   Primary osteoarthritis of right knee 10/22/2014   Inguinal hernia 11/23/2013    Allergies:  Allergies  Allergen Reactions   Morphine And Related Swelling   Bee Venom Rash   Medications:  Current Outpatient Medications:    albuterol (PROAIR HFA) 108 (90 Base) MCG/ACT inhaler, USE 2 PUFFS EVERY 6 HOURS AS NEEDED, Disp: 8.5  g, Rfl: 3   famotidine (PEPCID) 20 MG tablet, Take 1 tablet (20 mg total) by mouth 2 (two) times daily as needed for heartburn or indigestion (breakthrough not controlled by Protonix)., Disp: 180 tablet, Rfl: 3   mirtazapine (REMERON) 7.5 MG tablet, Take 1 tablet (7.5 mg total) by mouth at bedtime. For anxiety/ sleep, Disp: 90 tablet, Rfl: 0   pantoprazole (PROTONIX) 40 MG tablet, Take 1 tablet (40 mg total) by mouth daily., Disp: 90 tablet, Rfl: 3   rosuvastatin (CRESTOR) 20 MG tablet, Take 1 tablet (20 mg total) by mouth daily., Disp: 90  tablet, Rfl: 1   sucralfate (CARAFATE) 1 GM/10ML suspension, Take 10 mLs (1 g total) by mouth 4 (four) times daily -  with meals and at bedtime., Disp: 420 mL, Rfl: 0  Observations/Objective: Patient is well-developed, well-nourished in no acute distress.  Resting comfortably  at home.  Head is normocephalic, atraumatic.  No labored breathing.  Speech is clear and coherent with logical content.  Patient is alert and oriented at baseline.  Deep voice  Assessment and Plan:  [name] in today with chief complaint of Sinusitis   1. Upper respiratory infection with cough and congestion 1. Take meds as prescribed 2. Use a cool mist humidifier especially during the winter months and when heat has been humid. 3. Use saline nose sprays frequently 4. Saline irrigations of the nose can be very helpful if Maldonado frequently.  * 4X daily for 1 week*  * Use of a nettie pot can be helpful with this. Follow directions with this* 5. Drink plenty of fluids 6. Keep thermostat turn down low 7.For any cough or congestion- tessalon perles 8. For fever or aces or pains- take tylenol or ibuprofen appropriate for age and weight.  * for fevers greater than 101 orally you may alternate ibuprofen and tylenol every  3 hours.    Meds ordered this encounter  Medications   fluticasone (FLONASE) 50 MCG/ACT nasal spray    Sig: Place 2 sprays into both nostrils daily.    Dispense:  16 g    Refill:  6    Order Specific Question:   Supervising Provider    Answer:   Caryl Pina A [[id number]]   benzonatate (TESSALON PERLES) 100 MG capsule    Sig: Take 1 capsule (100 mg total) by mouth 3 (three) times daily as needed.    Dispense:  20 capsule    Refill:  0    Order Specific Question:   Supervising Provider    Answer:   Caryl Pina A [id number]       Follow Up Instructions: I discussed the assessment and treatment plan with the patient. The patient was provided an opportunity to ask questions  and all were answered. The patient agreed with the plan and demonstrated an understanding of the instructions.  A copy of instructions were sent to the patient via MyChart.  The patient was advised to call back or seek an in-person evaluation if the symptoms worsen or if the condition fails to improve as anticipated.  Time:  I spent 7 minutes with the patient via telehealth technology discussing the above problems/concerns.    Nathan Hassell Done, FNP

## 2023-02-07 NOTE — Patient Instructions (Signed)
[name], thank you for joining Chevis Pretty, FNP for today's virtual visit.  While this provider is not your primary care provider (PCP), if your PCP is located in our provider database this encounter information will be shared with them immediately following your visit.   [facility] [place] account gives you access to today's visit and all your visits, tests, and labs performed at [facility] " click here if you don't have a [facility] account or go to [email or web address]  Consent: (Patient) [name] provided verbal consent for this virtual visit at the beginning of the encounter.  Current Medications:  Current Outpatient Medications:    benzonatate (TESSALON PERLES) 100 MG capsule, Take 1 capsule (100 mg total) by mouth 3 (three) times daily as needed., Disp: 20 capsule, Rfl: 0   fluticasone (FLONASE) 50 MCG/ACT nasal spray, Place 2 sprays into both nostrils daily., Disp: 16 g, Rfl: 6   albuterol (PROAIR HFA) 108 (90 Base) MCG/ACT inhaler, USE 2 PUFFS EVERY 6 HOURS AS NEEDED, Disp: 8.5 g, Rfl: 3   famotidine (PEPCID) 20 MG tablet, Take 1 tablet (20 mg total) by mouth 2 (two) times daily as needed for heartburn or indigestion (breakthrough not controlled by Protonix)., Disp: 180 tablet, Rfl: 3   mirtazapine (REMERON) 7.5 MG tablet, Take 1 tablet (7.5 mg total) by mouth at bedtime. For anxiety/ sleep, Disp: 90 tablet, Rfl: 0   pantoprazole (PROTONIX) 40 MG tablet, Take 1 tablet (40 mg total) by mouth daily., Disp: 90 tablet, Rfl: 3   rosuvastatin (CRESTOR) 20 MG tablet, Take 1 tablet (20 mg total) by mouth daily., Disp: 90 tablet, Rfl: 1   sucralfate (CARAFATE) 1 GM/10ML suspension, Take 10 mLs (1 g total) by mouth 4 (four) times daily -  with meals and at bedtime., Disp: 420 mL, Rfl: 0   Medications ordered in this encounter:  Meds ordered this encounter  Medications   fluticasone (FLONASE) 50 MCG/ACT nasal spray    Sig: Place 2 sprays  into both nostrils daily.    Dispense:  16 g    Refill:  6    Order Specific Question:   Supervising Provider    Answer:   Caryl Pina A [[id number]]   benzonatate (TESSALON PERLES) 100 MG capsule    Sig: Take 1 capsule (100 mg total) by mouth 3 (three) times daily as needed.    Dispense:  20 capsule    Refill:  0    Order Specific Question:   Supervising Provider    Answer:   Caryl Pina A [id number]     *If you need refills on other medications prior to your next appointment, please contact your pharmacy*  Follow-Up: Call back or seek an in-person evaluation if the symptoms worsen or if the condition fails to improve as anticipated.  [place]  Other Instructions 1. Take meds as prescribed 2. Use a cool mist humidifier especially during the winter months and when heat has been humid. 3. Use saline nose sprays frequently 4. Saline irrigations of the nose can be very helpful if done frequently.  * 4X daily for 1 week*  * Use of a nettie pot can be helpful with this. Follow directions with this* 5. Drink plenty of fluids 6. Keep thermostat turn down low 7.For any cough or congestion- tessalon 8. For fever or aces or pains- take tylenol or ibuprofen appropriate for age and weight.  * for fevers greater than 101 orally you may  alternate ibuprofen and tylenol every  3 hours.       If you have been instructed to have an in-person evaluation today at a local Urgent Care facility, please use the link below. It will take you to a list of all of our available [facility], including address, phone number and hours of operation. Please do not delay care.  [facility]  If you or a family member do not have a primary care provider, use the link below to schedule a visit and establish care. When you choose a [facility] primary care physician or advanced practice provider, you gain a long-term partner in health. Find a Primary  Care Provider  Learn more about [facility]'s in-office and virtual care options: [facility]

## 2023-03-18 ENCOUNTER — Encounter: Payer: Self-pay | Admitting: Family Medicine

## 2023-03-18 ENCOUNTER — Ambulatory Visit (INDEPENDENT_AMBULATORY_CARE_PROVIDER_SITE_OTHER): Payer: BC Managed Care – PPO | Admitting: Family Medicine

## 2023-03-18 VITALS — BP 114/78 | HR 66 | Ht 65.0 in | Wt 183.0 lb

## 2023-03-18 DIAGNOSIS — E782 Mixed hyperlipidemia: Secondary | ICD-10-CM

## 2023-03-18 DIAGNOSIS — F419 Anxiety disorder, unspecified: Secondary | ICD-10-CM | POA: Diagnosis not present

## 2023-03-18 DIAGNOSIS — F430 Acute stress reaction: Secondary | ICD-10-CM | POA: Diagnosis not present

## 2023-03-18 DIAGNOSIS — G479 Sleep disorder, unspecified: Secondary | ICD-10-CM | POA: Diagnosis not present

## 2023-03-18 DIAGNOSIS — K219 Gastro-esophageal reflux disease without esophagitis: Secondary | ICD-10-CM

## 2023-03-18 MED ORDER — MIRTAZAPINE 7.5 MG PO TABS: 7.5000 mg | ORAL_TABLET | Freq: Every day | ORAL | 1 refills | Status: DC

## 2023-03-18 MED ORDER — ROSUVASTATIN CALCIUM 20 MG PO TABS: 20.0000 mg | ORAL_TABLET | Freq: Every day | ORAL | 1 refills | Status: DC

## 2023-03-18 NOTE — Progress Notes (Signed)
BP 114/78   Pulse 66   Ht  (1.651 m)   Wt 183 lb (83 kg)   SpO2 98%   BMI 30.45 kg/m    Subjective:   Patient ID: [name], male    DOB: 1961/03/16, 62 y.o.   MRN: [id number]  HPI: [name] is a 62 y.o. male presenting on 03/18/2023 for Medical Management of Chronic Issues, Hyperlipidemia, Gastroesophageal Reflux, and Anxiety   HPI Hyperlipidemia Patient is coming in for recheck of his hyperlipidemia. The patient is currently taking Crestor. They deny any issues with myalgias or history of liver damage from it. They deny any focal numbness or weakness or chest pain.   GERD Patient is currently on pantoprazole and famotidine.  She denies any major symptoms or abdominal pain or belching or burping. She denies any blood in her stool or lightheadedness or dizziness.   Patient already with complaint today is he started get some contractures especially in his right hand like he had previously.  He already has an appointment with a hand surgeon.  Anxiety and stress and sleep recheck. Patient is taking mirtazapine for anxiety and stress and sleep.  He feels like it does help the mirtazapine and seems to be doing well.  He denies any issues and feels like things are going well.    03/18/2023    1:31 PM 01/31/2023   10:27 AM 01/31/2023   10:20 AM 12/30/2022    9:47 AM 09/03/2022    1:10 PM  Depression screen PHQ 2/9  Decreased Interest 0 Down, Depressed, Hopeless PHQ - 2 Score Altered sleeping 0 2  0 0  Tired, decreased energy Change in appetite 0 2 0 0 0  Feeling bad or failure about yourself  0 3 0 1 0  Trouble concentrating 0 0 0 0 0  Moving slowly or fidgety/restless 0 0 0 0 0  Suicidal thoughts 0 2 0 0 0  PHQ-9 Score Difficult doing work/chores Not difficult at all Somewhat difficult Somewhat difficult Somewhat difficult Somewhat difficult     Relevant past medical, surgical, family and social history reviewed  and updated as indicated. Interim medical history since our last visit reviewed. Allergies and medications reviewed and updated.  Review of Systems  Constitutional:  Negative for chills and fever.  Eyes:  Negative for visual disturbance.  Respiratory:  Negative for shortness of breath and wheezing.   Cardiovascular:  Negative for chest pain and leg swelling.  Musculoskeletal:  Negative for back pain and gait problem.  Skin:  Negative for rash.  Neurological:  Negative for dizziness, weakness and light-headedness.  All other systems reviewed and are negative.   Per HPI unless specifically indicated above   Allergies as of 03/18/2023       Reactions   Morphine And Related Swelling   Bee Venom Rash        Medication List        Accurate as of March 18, 2023  1:50 PM. If you have any questions, ask your nurse or doctor.          albuterol 108 (90 Base) MCG/ACT inhaler Commonly known as: ProAir HFA USE 2 PUFFS EVERY 6 HOURS AS NEEDED   benzonatate 100 MG capsule Commonly known as: Tessalon Perles Take 1 capsule (100 mg total) by mouth  3 (three) times daily as needed.   famotidine 20 MG tablet Commonly known as: Pepcid Take 1 tablet (20 mg total) by mouth 2 (two) times daily as needed for heartburn or indigestion (breakthrough not controlled by Protonix).   fluticasone 50 MCG/ACT nasal spray Commonly known as: FLONASE Place 2 sprays into both nostrils daily.   mirtazapine 7.5 MG tablet Commonly known as: REMERON Take 1 tablet (7.5 mg total) by mouth at bedtime. For anxiety/ sleep   pantoprazole 40 MG tablet Commonly known as: PROTONIX Take 1 tablet (40 mg total) by mouth daily.   rosuvastatin 20 MG tablet Commonly known as: Crestor Take 1 tablet (20 mg total) by mouth daily.   sucralfate 1 GM/10ML suspension Commonly known as: Carafate Take 10 mLs (1 g total) by mouth 4 (four) times daily -  with meals and at bedtime.         Objective:   BP 114/78    Pulse 66   Ht 5\' 5"  (1.651 m)   Wt 183 lb (83 kg)   SpO2 98%   BMI 30.45 kg/m   Wt Readings from Last 3 Encounters:  03/18/23 183 lb (83 kg)  01/31/23 189 lb (85.7 kg)  01/26/23 185 lb (83.9 kg)    Physical Exam Vitals and nursing note reviewed.  Constitutional:      General: He is not in acute distress.    Appearance: He is well-developed. He is not diaphoretic.  Eyes:     General: No scleral icterus.    Conjunctiva/sclera: Conjunctivae normal.  Neck:     Thyroid: No thyromegaly.  Cardiovascular:     Rate and Rhythm: Normal rate and regular rhythm.     Heart sounds: Normal heart sounds. No murmur heard. Pulmonary:     Effort: Pulmonary effort is normal. No respiratory distress.     Breath sounds: Normal breath sounds. No wheezing.  Musculoskeletal:        General: No swelling. Normal range of motion.     Cervical back: Neck supple.  Lymphadenopathy:     Cervical: No cervical adenopathy.  Skin:    General: Skin is warm and dry.     Findings: No rash.  Neurological:     Mental Status: He is alert and oriented to person, place, and time.     Coordination: Coordination normal.  Psychiatric:        Behavior: Behavior normal.       Assessment & Plan:   Problem List Items Addressed This Visit       Digestive   Esophageal reflux   Relevant Orders   CBC with Differential/Platelet     Other   Mixed hyperlipidemia - Primary   Relevant Medications   rosuvastatin (CRESTOR) 20 MG tablet   Other Relevant Orders   Lipid panel   CMP14+EGFR   Anxiety   Relevant Medications   mirtazapine (REMERON) 7.5 MG tablet   Stress reaction   Relevant Medications   mirtazapine (REMERON) 7.5 MG tablet   Sleep difficulties   Relevant Medications   mirtazapine (REMERON) 7.5 MG tablet    Will check blood work today, continue current medicine.  No changes Follow up plan: Return in about 6 months (around 09/17/2023), or if symptoms worsen or fail to improve, for Physical exam  anxiety hyperlipidemia and GERD.  Counseling provided for all of the vaccine components Orders Placed This Encounter  Procedures   CBC with Differential/Platelet   Lipid panel   CMP14+EGFR    Arville Care,  MD [facility] 03/18/2023, 1:50 PM

## 2023-03-19 LAB — CBC WITH DIFFERENTIAL/PLATELET
Basophils Absolute: 0 10*3/uL (ref 0.0–0.2)
Basos: 1 %
EOS (ABSOLUTE): 0.1 10*3/uL (ref 0.0–0.4)
Eos: 1 %
Hematocrit: 43.6 % (ref 37.5–51.0)
Hemoglobin: 14.5 g/dL (ref 13.0–17.7)
Immature Grans (Abs): 0 10*3/uL (ref 0.0–0.1)
Immature Granulocytes: 0 %
Lymphocytes Absolute: 1.8 10*3/uL (ref 0.7–3.1)
Lymphs: 22 %
MCH: 30.3 pg (ref 26.6–33.0)
MCHC: 33.3 g/dL (ref 31.5–35.7)
MCV: 91 fL (ref 79–97)
Monocytes Absolute: 0.8 10*3/uL (ref 0.1–0.9)
Monocytes: 9 %
Neutrophils Absolute: 5.5 10*3/uL (ref 1.4–7.0)
Neutrophils: 67 %
Platelets: 262 10*3/uL (ref 150–450)
RBC: 4.78 x10E6/uL (ref 4.14–5.80)
RDW: 13.5 % (ref 11.6–15.4)
WBC: 8.2 10*3/uL (ref 3.4–10.8)

## 2023-03-19 LAB — CMP14+EGFR
ALT: 16 IU/L (ref 0–44)
AST: 22 IU/L (ref 0–40)
Albumin/Globulin Ratio: 1.8 (ref 1.2–2.2)
Albumin: 4.4 g/dL (ref 3.8–4.9)
Alkaline Phosphatase: 83 IU/L (ref 44–121)
BUN/Creatinine Ratio: 12 (ref 9–20)
BUN: 12 mg/dL (ref 6–24)
Bilirubin Total: 0.5 mg/dL (ref 0.0–1.2)
CO2: 20 mmol/L (ref 20–29)
Calcium: 9.3 mg/dL (ref 8.7–10.2)
Chloride: 103 mmol/L (ref 96–106)
Creatinine, Ser: 1.01 mg/dL (ref 0.76–1.27)
Globulin, Total: 2.5 g/dL (ref 1.5–4.5)
Glucose: 97 mg/dL (ref 70–99)
Potassium: 4.1 mmol/L (ref 3.5–5.2)
Sodium: 140 mmol/L (ref 134–144)
Total Protein: 6.9 g/dL (ref 6.0–8.5)
eGFR: 86 mL/min/{1.73_m2} (ref >59)

## 2023-03-19 LAB — LIPID PANEL
Chol/HDL Ratio: 3.6 ratio (ref 0.0–5.0)
Cholesterol, Total: 134 mg/dL (ref 100–199)
HDL: 37 mg/dL — ABNORMAL LOW (ref >39)
LDL Chol Calc (NIH): 79 mg/dL (ref 0–99)
Triglycerides: 95 mg/dL (ref 0–149)
VLDL Cholesterol Cal: 18 mg/dL (ref 5–40)

## 2023-03-31 ENCOUNTER — Telehealth: Payer: Self-pay | Admitting: Family Medicine

## 2023-03-31 NOTE — Telephone Encounter (Signed)
[employer] faxed FMLA forms to be completed and signed on 03/30/23.  Message left with patient to contact us to get more information and payment for this before we can process.

## 2023-04-01 NOTE — Telephone Encounter (Signed)
Patient paid for FMLA Forms have been placed in the RX/HH Nurse Coordinators box for completion.

## 2023-04-04 NOTE — Telephone Encounter (Signed)
Information completed and forwarded to PCP 

## 2023-04-05 NOTE — Telephone Encounter (Signed)
Patient is calling checking on his FMLA. [employer] needs by 04-15-23. Please call.

## 2023-04-07 ENCOUNTER — Telehealth: Payer: Self-pay | Admitting: Family Medicine

## 2023-04-07 NOTE — Telephone Encounter (Signed)
LMOVM ppw still on PCP's desk

## 2023-04-08 NOTE — Telephone Encounter (Signed)
[employer] forms completed and signed. They have been faxed to [facility] at fax number [phone]. Patient has been contacted and informed they are completed and there is a copy at the front desk.

## 2023-09-01 ENCOUNTER — Other Ambulatory Visit: Payer: Self-pay | Admitting: Family Medicine

## 2023-09-01 DIAGNOSIS — J40 Bronchitis, not specified as acute or chronic: Secondary | ICD-10-CM

## 2023-09-07 ENCOUNTER — Other Ambulatory Visit: Payer: Self-pay

## 2023-09-07 ENCOUNTER — Emergency Department (HOSPITAL_COMMUNITY): Payer: BC Managed Care – PPO

## 2023-09-07 ENCOUNTER — Emergency Department (HOSPITAL_COMMUNITY)
Admission: EM | Admit: 2023-09-07 | Discharge: 2023-09-08 | Disposition: A | Discharge status: Home / Self Care | Payer: BC Managed Care – PPO | Attending: Emergency Medicine | Admitting: Emergency Medicine

## 2023-09-07 ENCOUNTER — Encounter (HOSPITAL_COMMUNITY): Payer: Self-pay

## 2023-09-07 DIAGNOSIS — R0602 Shortness of breath: Secondary | ICD-10-CM | POA: Insufficient documentation

## 2023-09-07 DIAGNOSIS — Z7982 Long term (current) use of aspirin: Secondary | ICD-10-CM | POA: Diagnosis not present

## 2023-09-07 DIAGNOSIS — R079 Chest pain, unspecified: Secondary | ICD-10-CM | POA: Diagnosis present

## 2023-09-07 DIAGNOSIS — K4090 Unilateral inguinal hernia, without obstruction or gangrene, not specified as recurrent: Secondary | ICD-10-CM | POA: Insufficient documentation

## 2023-09-07 DIAGNOSIS — R0789 Other chest pain: Secondary | ICD-10-CM | POA: Diagnosis not present

## 2023-09-07 DIAGNOSIS — R1110 Vomiting, unspecified: Secondary | ICD-10-CM | POA: Insufficient documentation

## 2023-09-07 LAB — CBC
HCT: 46.5 % (ref 39.0–52.0)
Hemoglobin: 15.5 g/dL (ref 13.0–17.0)
MCH: 30.5 pg (ref 26.0–34.0)
MCHC: 33.3 g/dL (ref 30.0–36.0)
MCV: 91.4 fL (ref 80.0–100.0)
Platelets: 310 10*3/uL (ref 150–400)
RBC: 5.09 MIL/uL (ref 4.22–5.81)
RDW: 14.8 % (ref 11.5–15.5)
WBC: 8.4 10*3/uL (ref 4.0–10.5)
nRBC: 0 % (ref 0.0–0.2)

## 2023-09-07 LAB — CBG MONITORING, ED: Glucose-Capillary: 101 mg/dL — ABNORMAL HIGH (ref 70–99)

## 2023-09-07 MED ORDER — ASPIRIN 81 MG PO CHEW: 324.0000 mg | CHEWABLE_TABLET | Freq: Once | ORAL | Status: DC

## 2023-09-07 NOTE — ED Provider Notes (Signed)
[place] EMERGENCY DEPARTMENT AT [facility] Provider Note   CSN: [id number] Arrival date & time: 09/07/23  2225     History {Add pertinent medical, surgical, social history, OB history to HPI:1} Chief Complaint  Patient presents with   Chest Pain    [name] is a 62 y.o. male.  History of indigestion but today had worsening chest pain.  Patient states that started yesterday off and on.  But today was very severe nature so he called EMS.  Had multiple episodes of nonbloody nonbilious emesis.  States his blood pressure was 100 diastolic with EMS.  Still feels weak but chest pain is improved.  No recent fevers but has been coughing.  No productive cough.  No abdominal pain.  No right chest pain at this before.   Chest Pain      Home Medications Prior to Admission medications   Medication Sig Start Date End Date Taking? Authorizing Provider  albuterol (VENTOLIN HFA) 108 (90 Base) MCG/ACT inhaler USE 2 PUFFS EVERY 6 HOURS AS NEEDED 09/01/23   Dettinger, Elige Radon, MD  benzonatate (TESSALON PERLES) 100 MG capsule Take 1 capsule (100 mg total) by mouth 3 (three) times daily as needed. 02/07/23   Daphine Deutscher Mary-Margaret, FNP  famotidine (PEPCID) 20 MG tablet Take 1 tablet (20 mg total) by mouth 2 (two) times daily as needed for heartburn or indigestion (breakthrough not controlled by Protonix). 01/31/23   Raliegh Ip, DO  fluticasone (FLONASE) 50 MCG/ACT nasal spray Place 2 sprays into both nostrils daily. 02/07/23   Daphine Deutscher, Mary-Margaret, FNP  mirtazapine (REMERON) 7.5 MG tablet Take 1 tablet (7.5 mg total) by mouth at bedtime. For anxiety/ sleep 03/18/23   Dettinger, Elige Radon, MD  pantoprazole (PROTONIX) 40 MG tablet Take 1 tablet (40 mg total) by mouth daily. 09/03/22   Dettinger, Elige Radon, MD  rosuvastatin (CRESTOR) 20 MG tablet Take 1 tablet (20 mg total) by mouth daily. 03/18/23   Dettinger, Elige Radon, MD  sucralfate (CARAFATE) 1 GM/10ML suspension Take 10 mLs (1 g  total) by mouth 4 (four) times daily -  with meals and at bedtime. 01/26/23   Tarez Bowns, Barbara Cower, MD      Allergies    Morphine and codeine and Bee venom    Review of Systems   Review of Systems  Cardiovascular:  Positive for chest pain.    Physical Exam Updated Vital Signs BP 113/82 (BP Location: Left Arm)   Pulse 84   Resp 16   Wt 83.9 kg   SpO2 93%   BMI 30.79 kg/m  Physical Exam Vitals and nursing note reviewed.  Constitutional:      Appearance: He is well-developed.  HENT:     Head: Normocephalic and atraumatic.  Cardiovascular:     Rate and Rhythm: Normal rate.  Pulmonary:     Effort: Pulmonary effort is normal. No respiratory distress.     Comments: Tachypneic to 22 and hypoxic to 94.  Non-smoker.  Lungs clear although slightly asymmetrically diminished on the right. Abdominal:     General: There is no distension.  Musculoskeletal:        General: Normal range of motion.     Cervical back: Normal range of motion.  Neurological:     Mental Status: He is alert.     ED Results / Procedures / Treatments   Labs (all labs ordered are listed, but only abnormal results are displayed) Labs Reviewed  CBC  BASIC METABOLIC PANEL  BRAIN NATRIURETIC  PEPTIDE  HEPATIC FUNCTION PANEL  LIPASE, BLOOD  CBG MONITORING, ED  TROPONIN I (HIGH SENSITIVITY)    EKG None  Radiology DG Chest Portable 1 View  Result Date: 09/07/2023 CLINICAL DATA:  Chest pain EXAM: PORTABLE CHEST 1 VIEW COMPARISON:  07/27/2020 FINDINGS: Heart and mediastinal contours within normal limits. No confluent airspace opacities or effusions. No acute bony abnormality IMPRESSION: No active disease. Electronically Signed   By: Charlett Nose M.D.   On: 09/07/2023 23:09    Procedures Procedures  {Document cardiac monitor, telemetry assessment procedure when appropriate:1}  Medications Ordered in ED Medications  aspirin chewable tablet 324 mg (has no administration in time range)    ED Course/ Medical  Decision Making/ A&P   {   Click here for ABCD2, HEART and other calculatorsREFRESH Note before signing :1}                              Medical Decision Making Amount and/or Complexity of Data Reviewed Labs: ordered. Radiology: ordered. ECG/medicine tests: ordered.   ***  {Document critical care time when appropriate:1} {Document review of labs and clinical decision tools ie heart score, Chads2Vasc2 etc:1}  {Document your independent review of radiology images, and any outside records:1} {Document your discussion with family members, caretakers, and with consultants:1} {Document social determinants of health affecting pt's care:1} {Document your decision making why or why not admission, treatments were needed:1} Final Clinical Impression(s) / ED Diagnoses Final diagnoses:  None    Rx / DC Orders ED Discharge Orders     None

## 2023-09-07 NOTE — ED Triage Notes (Signed)
Pt via [facility] c/o chest pain since last night. Located in central chest. Pain is 710. Endorsees some SOB.   4 aspirin taken at home 2 nitroglycerin given on truck

## 2023-09-08 DIAGNOSIS — R0789 Other chest pain: Secondary | ICD-10-CM | POA: Diagnosis not present

## 2023-09-08 LAB — BASIC METABOLIC PANEL
Anion gap: 11 (ref 5–15)
BUN: 14 mg/dL (ref 6–20)
CO2: 24 mmol/L (ref 22–32)
Calcium: 9 mg/dL (ref 8.9–10.3)
Chloride: 103 mmol/L (ref 98–111)
Creatinine, Ser: 1.13 mg/dL (ref 0.61–1.24)
GFR, Estimated: >60 mL/min (ref >60)
Glucose, Bld: 102 mg/dL — ABNORMAL HIGH (ref 70–99)
Potassium: 3.8 mmol/L (ref 3.5–5.1)
Sodium: 138 mmol/L (ref 135–145)

## 2023-09-08 LAB — LIPASE, BLOOD: Lipase: 32 U/L (ref 11–51)

## 2023-09-08 LAB — HEPATIC FUNCTION PANEL
ALT: 32 U/L (ref 0–44)
AST: 25 U/L (ref 15–41)
Albumin: 4.1 g/dL (ref 3.5–5.0)
Alkaline Phosphatase: 71 U/L (ref 38–126)
Bilirubin, Direct: 0.1 mg/dL (ref 0.0–0.2)
Indirect Bilirubin: 0.4 mg/dL (ref 0.3–0.9)
Total Bilirubin: 0.5 mg/dL (ref 0.3–1.2)
Total Protein: 7.3 g/dL (ref 6.5–8.1)

## 2023-09-08 LAB — BRAIN NATRIURETIC PEPTIDE: B Natriuretic Peptide: 16 pg/mL (ref 0.0–100.0)

## 2023-09-08 LAB — TROPONIN I (HIGH SENSITIVITY)
Troponin I (High Sensitivity): 2 ng/L (ref <18)
Troponin I (High Sensitivity): 2 ng/L (ref <18)

## 2023-09-08 MED ORDER — IOHEXOL 350 MG/ML SOLN
100.0000 mL | Freq: Once | INTRAVENOUS | Status: AC | PRN
  Administered 2023-09-08: 100 mL via INTRAVENOUS

## 2023-09-08 MED ORDER — ASPIRIN 81 MG PO CHEW: 81.0000 mg | CHEWABLE_TABLET | Freq: Every day | ORAL | 1 refills | Status: DC

## 2023-09-08 NOTE — ED Notes (Signed)
Patient transported to CT 

## 2023-09-09 ENCOUNTER — Other Ambulatory Visit: Payer: Self-pay | Admitting: Family Medicine

## 2023-09-09 DIAGNOSIS — Z1212 Encounter for screening for malignant neoplasm of rectum: Secondary | ICD-10-CM

## 2023-09-09 DIAGNOSIS — Z1211 Encounter for screening for malignant neoplasm of colon: Secondary | ICD-10-CM

## 2023-09-30 ENCOUNTER — Ambulatory Visit: Payer: BC Managed Care – PPO | Admitting: Family Medicine

## 2023-09-30 ENCOUNTER — Encounter: Payer: Self-pay | Admitting: Family Medicine

## 2023-09-30 VITALS — BP 144/93 | HR 72 | Ht 65.0 in | Wt 198.0 lb

## 2023-09-30 DIAGNOSIS — K219 Gastro-esophageal reflux disease without esophagitis: Secondary | ICD-10-CM | POA: Diagnosis not present

## 2023-09-30 DIAGNOSIS — Z23 Encounter for immunization: Secondary | ICD-10-CM

## 2023-09-30 DIAGNOSIS — E782 Mixed hyperlipidemia: Secondary | ICD-10-CM | POA: Diagnosis not present

## 2023-09-30 DIAGNOSIS — F419 Anxiety disorder, unspecified: Secondary | ICD-10-CM

## 2023-09-30 DIAGNOSIS — J683 Other acute and subacute respiratory conditions due to chemicals, gases, fumes and vapors: Secondary | ICD-10-CM | POA: Diagnosis not present

## 2023-09-30 MED ORDER — ROSUVASTATIN CALCIUM 20 MG PO TABS: 20.0000 mg | ORAL_TABLET | Freq: Every day | ORAL | 3 refills | Status: DC

## 2023-09-30 MED ORDER — PANTOPRAZOLE SODIUM 40 MG PO TBEC
40.0000 mg | DELAYED_RELEASE_TABLET | Freq: Every day | ORAL | 3 refills | Status: DC
Start: 2023-09-30 — End: 2023-12-13

## 2023-09-30 MED ORDER — ALBUTEROL SULFATE HFA 108 (90 BASE) MCG/ACT IN AERS
INHALATION_SPRAY | RESPIRATORY_TRACT | 0 refills | Status: DC
Start: 2023-09-30 — End: 2024-10-17

## 2023-09-30 MED ORDER — MIRTAZAPINE 7.5 MG PO TABS
7.5000 mg | ORAL_TABLET | Freq: Every day | ORAL | 1 refills | Status: DC
Start: 2023-09-30 — End: 2023-12-05

## 2023-09-30 MED ORDER — SERTRALINE HCL 50 MG PO TABS: 50.0000 mg | ORAL_TABLET | Freq: Every day | ORAL | 2 refills | Status: DC

## 2023-09-30 NOTE — Progress Notes (Signed)
BP (!) 144/93   Pulse 72   Ht 5\' 5"  (1.651 m)   Wt 198 lb (89.8 kg)   SpO2 96%   BMI 32.95 kg/m    Subjective:   Patient ID: [name], male    DOB: [birth year], 62 y.o.   MRN: [id number]  HPI: [name] is a 62 y.o. male presenting on 09/30/2023 for Medical Management of Chronic Issues, Hyperlipidemia, and Hypertension  Hyperlipidemia Patient is not currently taking any medications. He was previously prescribed Crestor 20mg  daily, but he ran out of this medication a few months ago. He is agreeable to restarting medication if refills are sent to the pharmacy. He denies any myalgias or muscle weakness when taking the medication. He does not have a history of liver damage from it.  GERD Patient is currently taking famotidine and pantoprazole. He reports the medications help his symptoms, but he does occasionally have heartburn and epigastric pain. He denies burping or belching, blood in his stools, or dysphagia.  Anxiety Patient is currently taking mirtazapine 7.5mg  at night. He reports this medication has significantly helped with his sleep. He continues to feel anxious during the day and has panic attacks 3-4 times per month. He experiences shortness of breath, palpitations, chest pain, and sweating during the panic attacks, and they last about 2 hours each. He has also been struggling with his temper lately as he will be triggered by little things.   Reactive Airway Disease Patient reports a diagnosis of bronchitis, but he has never seen a pulmonologist or allergist. He smoked briefly as a teen, but he does not have a longstanding history of tobacco use. He reports recently using the albuterol inhaler 1-2 times per day for wheezing and shortness of breath. His symptoms are generally worse in the fall. He does not take any allergy medication.  Patient was seen in the ER on 10/2 for nonspecific chest pain. He started experiencing chest pain, palpitations, diaphoresis, ans  shortness of breath while sitting in bed, so his wife called the ambulance. He received nitroglycerin and was monitored in the ED, but workup was negative. He is scheduled to see cardiology for further evaluation on 11/12. He is unsure whether this episode is related to his anxiety or heart as it felt very similar to a panic attack.  Relevant past medical, surgical, family and social history reviewed and updated as indicated. Interim medical history since our last visit reviewed. Allergies and medications reviewed and updated.  Review of Systems  Constitutional:  Negative for chills, fatigue and fever.  HENT:  Negative for congestion, sinus pressure and trouble swallowing.   Respiratory:  Positive for chest tightness, shortness of breath and wheezing.        During panic attacks and with reactive airway disease  Cardiovascular:  Positive for chest pain and palpitations.       During panic attacks  Gastrointestinal:  Negative for abdominal pain, blood in stool and nausea.  Endocrine: Negative for cold intolerance and heat intolerance.  Musculoskeletal:  Negative for myalgias.  Allergic/Immunologic: Positive for environmental allergies.  Neurological:  Negative for syncope, weakness and headaches.  Psychiatric/Behavioral:  Positive for dysphoric mood. Negative for sleep disturbance and suicidal ideas. The patient is nervous/anxious.     Per HPI unless specifically indicated above   Allergies as of 09/30/2023       Reactions   Morphine And Codeine Swelling   Bee Venom Rash        Medication List  Accurate as of September 30, 2023 12:54 PM. If you have any questions, ask your nurse or doctor.          STOP taking these medications    benzonatate 100 MG capsule Commonly known as: Lawyer Stopped by: Elige Radon Jahari Wiginton       TAKE these medications    albuterol 108 (90 Base) MCG/ACT inhaler Commonly known as: VENTOLIN HFA USE 2 PUFFS EVERY 6 HOURS AS  NEEDED   aspirin 81 MG chewable tablet Chew 1 tablet (81 mg total) by mouth daily.   famotidine 20 MG tablet Commonly known as: Pepcid Take 1 tablet (20 mg total) by mouth 2 (two) times daily as needed for heartburn or indigestion (breakthrough not controlled by Protonix).   fluticasone 50 MCG/ACT nasal spray Commonly known as: FLONASE Place 2 sprays into both nostrils daily.   mirtazapine 7.5 MG tablet Commonly known as: REMERON Take 1 tablet (7.5 mg total) by mouth at bedtime. For anxiety/ sleep   pantoprazole 40 MG tablet Commonly known as: PROTONIX Take 1 tablet (40 mg total) by mouth daily.   rosuvastatin 20 MG tablet Commonly known as: Crestor Take 1 tablet (20 mg total) by mouth daily.   sertraline 50 MG tablet Commonly known as: Zoloft Take 1 tablet (50 mg total) by mouth daily. Started by: Elige Radon Avari Nevares   sucralfate 1 GM/10ML suspension Commonly known as: Carafate Take 10 mLs (1 g total) by mouth 4 (four) times daily -  with meals and at bedtime.         Objective:   BP (!) 144/93   Pulse 72   Ht 5\' 5"  (1.651 m)   Wt 198 lb (89.8 kg)   SpO2 96%   BMI 32.95 kg/m   Wt Readings from Last 3 Encounters:  09/30/23 198 lb (89.8 kg)  09/07/23 185 lb (83.9 kg)  03/18/23 183 lb (83 kg)    Physical Exam Vitals and nursing note reviewed.  Constitutional:      General: He is not in acute distress.    Appearance: Normal appearance. He is obese.  HENT:     Head: Normocephalic and atraumatic.  Eyes:     Conjunctiva/sclera: Conjunctivae normal.  Cardiovascular:     Rate and Rhythm: Normal rate and regular rhythm.     Heart sounds: Normal heart sounds.  Pulmonary:     Effort: Pulmonary effort is normal. No respiratory distress.     Breath sounds: Normal breath sounds.  Abdominal:     General: Abdomen is flat.     Palpations: Abdomen is soft.     Tenderness: There is no abdominal tenderness.  Musculoskeletal:     Cervical back: Neck supple.      Right lower leg: No edema.     Left lower leg: No edema.  Skin:    General: Skin is warm and dry.  Neurological:     Mental Status: He is alert and oriented to person, place, and time.  Psychiatric:        Mood and Affect: Mood is anxious and depressed.        Behavior: Behavior normal.        Thought Content: Thought content does not include suicidal ideation. Thought content does not include suicidal plan.     Comments: Anxious mood      Assessment & Plan:   Problem List Items Addressed This Visit       Digestive   Esophageal reflux   Relevant Medications  pantoprazole (PROTONIX) 40 MG tablet   Other Relevant Orders   CBC with Differential/Platelet   TSH     Other   Mixed hyperlipidemia - Primary   Relevant Medications   rosuvastatin (CRESTOR) 20 MG tablet   Other Relevant Orders   CMP14+EGFR   Lipid panel   Anxiety   Relevant Medications   mirtazapine (REMERON) 7.5 MG tablet   sertraline (ZOLOFT) 50 MG tablet   Other Relevant Orders   TSH   Other Visit Diagnoses     Reactive airways dysfunction syndrome (HCC)       Relevant Medications   albuterol (VENTOLIN HFA) 108 (90 Base) MCG/ACT inhaler      Blood pressure elevated at 144/93 today. Patient has blood pressure cuff at home and will notify us if blood pressures remain >140/90. Will recheck lipids today and send Crestor prescription to pharmacy. Will start Zoloft for his anxiety in addition to the mirtazapine. Scheduled follow-up for 1-2 months. Recommended patient visit counselor, but he is not interested at this time. Regarding his reactive airway disease, can consider referral to pulmonology or allergy/immunology in future once cardiology workup is complete as patient may need daily maintenance inhaler. Recommended patient start taking Claritin and continue albuterol since symptoms worse during fall season.  Follow up plan: Return if symptoms worsen or fail to improve, for 1 to 16-month anxiety  recheck.  Counseling provided for all of the vaccine components Orders Placed This Encounter  Procedures   CBC with Differential/Platelet   CMP14+EGFR   Lipid panel   TSH   Gillermina Phy, Medical Student [facility] 09/30/2023, 12:54 PM  Patient seen and examined with Gillermina Phy, medical student.  Agree with assessment and plan above.  We will go ahead and start Zoloft 50 mg and see him back in a month or 2 to see how it is going.  He is to continue taking the mirtazapine.  With his blood pressure we have a lot of slightly permissive hypertension because of his age.  He is going to monitor his blood pressure more closely. Arville Care, MD [facility] 09/30/2023, 12:55 PM

## 2023-10-01 LAB — LIPID PANEL
Chol/HDL Ratio: 5.8 ratio — ABNORMAL HIGH (ref 0.0–5.0)
Cholesterol, Total: 221 mg/dL — ABNORMAL HIGH (ref 100–199)
HDL: 38 mg/dL — ABNORMAL LOW (ref >39)
LDL Chol Calc (NIH): 152 mg/dL — ABNORMAL HIGH (ref 0–99)
Triglycerides: 170 mg/dL — ABNORMAL HIGH (ref 0–149)
VLDL Cholesterol Cal: 31 mg/dL (ref 5–40)

## 2023-10-01 LAB — CBC WITH DIFFERENTIAL/PLATELET
Basophils Absolute: 0.1 10*3/uL (ref 0.0–0.2)
Basos: 1 %
EOS (ABSOLUTE): 0.1 10*3/uL (ref 0.0–0.4)
Eos: 1 %
Hematocrit: 46.7 % (ref 37.5–51.0)
Hemoglobin: 15.7 g/dL (ref 13.0–17.7)
Immature Grans (Abs): 0 10*3/uL (ref 0.0–0.1)
Immature Granulocytes: 0 %
Lymphocytes Absolute: 2.3 10*3/uL (ref 0.7–3.1)
Lymphs: 24 %
MCH: 31.2 pg (ref 26.6–33.0)
MCHC: 33.6 g/dL (ref 31.5–35.7)
MCV: 93 fL (ref 79–97)
Monocytes Absolute: 0.9 10*3/uL (ref 0.1–0.9)
Monocytes: 9 %
Neutrophils Absolute: 6.1 10*3/uL (ref 1.4–7.0)
Neutrophils: 65 %
Platelets: 294 10*3/uL (ref 150–450)
RBC: 5.03 x10E6/uL (ref 4.14–5.80)
RDW: 14.8 % (ref 11.6–15.4)
WBC: 9.5 10*3/uL (ref 3.4–10.8)

## 2023-10-01 LAB — CMP14+EGFR
ALT: 22 IU/L (ref 0–44)
AST: 22 IU/L (ref 0–40)
Albumin: 4.6 g/dL (ref 3.8–4.9)
Alkaline Phosphatase: 98 IU/L (ref 44–121)
BUN/Creatinine Ratio: 9 — ABNORMAL LOW (ref 10–24)
BUN: 10 mg/dL (ref 8–27)
Bilirubin Total: 0.3 mg/dL (ref 0.0–1.2)
CO2: 23 mmol/L (ref 20–29)
Calcium: 9.5 mg/dL (ref 8.6–10.2)
Chloride: 100 mmol/L (ref 96–106)
Creatinine, Ser: 1.12 mg/dL (ref 0.76–1.27)
Globulin, Total: 2.6 g/dL (ref 1.5–4.5)
Glucose: 100 mg/dL — ABNORMAL HIGH (ref 70–99)
Potassium: 4.6 mmol/L (ref 3.5–5.2)
Sodium: 140 mmol/L (ref 134–144)
Total Protein: 7.2 g/dL (ref 6.0–8.5)
eGFR: 75 mL/min/{1.73_m2} (ref >59)

## 2023-10-01 LAB — TSH: TSH: 3.8 u[IU]/mL (ref 0.450–4.500)

## 2023-10-18 ENCOUNTER — Ambulatory Visit: Payer: BC Managed Care – PPO | Admitting: Internal Medicine

## 2023-11-24 ENCOUNTER — Other Ambulatory Visit: Payer: Self-pay | Admitting: Family Medicine

## 2023-12-05 ENCOUNTER — Ambulatory Visit: Payer: BC Managed Care – PPO | Admitting: Family Medicine

## 2023-12-05 ENCOUNTER — Encounter: Payer: Self-pay | Admitting: Family Medicine

## 2023-12-05 VITALS — BP 122/81 | HR 75 | Temp 98.8°F | Ht 65.0 in | Wt 198.0 lb

## 2023-12-05 DIAGNOSIS — F430 Acute stress reaction: Secondary | ICD-10-CM | POA: Diagnosis not present

## 2023-12-05 DIAGNOSIS — F419 Anxiety disorder, unspecified: Secondary | ICD-10-CM | POA: Diagnosis not present

## 2023-12-05 DIAGNOSIS — Z0001 Encounter for general adult medical examination with abnormal findings: Secondary | ICD-10-CM

## 2023-12-05 DIAGNOSIS — Z0000 Encounter for general adult medical examination without abnormal findings: Secondary | ICD-10-CM

## 2023-12-05 MED ORDER — MIRTAZAPINE 7.5 MG PO TABS
7.5000 mg | ORAL_TABLET | Freq: Every day | ORAL | 3 refills | Status: AC
Start: 2023-12-05 — End: ?

## 2023-12-05 MED ORDER — SERTRALINE HCL 50 MG PO TABS
50.0000 mg | ORAL_TABLET | Freq: Every day | ORAL | 3 refills | Status: AC
Start: 2023-12-05 — End: ?

## 2023-12-05 MED ORDER — PROMETHAZINE-DM 6.25-15 MG/5ML PO SYRP: 5.0000 mL | ORAL_SOLUTION | Freq: Four times a day (QID) | ORAL | 0 refills | Status: DC | PRN

## 2023-12-05 NOTE — Addendum Note (Signed)
Addended by: Arville Care on: 12/05/2023 11:43 AM   Modules accepted: Orders

## 2023-12-05 NOTE — Progress Notes (Signed)
BP 122/81   Pulse 75   Temp 98.8 F (37.1 C)   Ht 5\' 5"  (1.651 m)   Wt 198 lb (89.8 kg)   SpO2 95%   BMI 32.95 kg/m    Subjective:   Patient ID: [name], male    DOB: 10/02/1961, 62 y.o.   MRN: [id number]  HPI: [name] is a 62 y.o. male presenting on 12/05/2023 for Medical Management of Chronic Issues (anxiety)   HPI Physical exam Patient denies any chest pain, shortness of breath, headaches or vision issues, abdominal complaints, diarrhea, nausea, vomiting, or joint issues.   Anxiety and stress recheck Patient was started on sertraline the last time he was seen, he is already been on mirtazapine prior to help with sleep.  He feels like he is doing really well and his wife is well says that he is doing really well and is very happy with the medicine and denies any side effects.  His anxiety is very much under control and he is sleeping well and not having any panic attacks.    12/05/2023   11:29 AM 09/30/2023   10:09 AM 03/18/2023    1:31 PM 01/31/2023   10:27 AM 01/31/2023   10:20 AM  Depression screen PHQ 2/9  Decreased Interest 0 1 0 3 1  Down, Depressed, Hopeless 0 1 1 3 1   PHQ - 2 Score 0 2 1 6 2   Altered sleeping 0 2 0 2   Tired, decreased energy 0 2 2 1 1   Change in appetite 0 0 0 2 0  Feeling bad or failure about yourself  0 2 0 3 0  Trouble concentrating 0 0 0 0 0  Moving slowly or fidgety/restless 0 1 0 0 0  Suicidal thoughts 0 0 0 2 0  PHQ-9 Score 0 9 3 16    Difficult doing work/chores Not difficult at all Not difficult at all Not difficult at all Somewhat difficult Somewhat difficult     Relevant past medical, surgical, family and social history reviewed and updated as indicated. Interim medical history since our last visit reviewed. Allergies and medications reviewed and updated.  Review of Systems  Constitutional:  Negative for chills and fever.  Eyes:  Negative for visual disturbance.  Respiratory:  Positive for cough (He gets the  occasional cough and the cough syrup the game for the pharmacy has been helping and so he wants refill on that). Negative for shortness of breath and wheezing.   Cardiovascular:  Negative for chest pain and leg swelling.  Musculoskeletal:  Negative for back pain and gait problem.  Skin:  Negative for rash.  Neurological:  Negative for dizziness and light-headedness.  Psychiatric/Behavioral:  Positive for dysphoric mood. Negative for self-injury, sleep disturbance and suicidal ideas. The patient is nervous/anxious.   All other systems reviewed and are negative.   Per HPI unless specifically indicated above   Allergies as of 12/05/2023       Reactions   Morphine And Codeine Swelling   Bee Venom Rash        Medication List        Accurate as of December 05, 2023 11:41 AM. If you have any questions, ask your nurse or doctor.          albuterol 108 (90 Base) MCG/ACT inhaler Commonly known as: VENTOLIN HFA USE 2 PUFFS EVERY 6 HOURS AS NEEDED   aspirin 81 MG chewable tablet Chew 1 tablet (81 mg total) by mouth daily.  famotidine 20 MG tablet Commonly known as: Pepcid Take 1 tablet (20 mg total) by mouth 2 (two) times daily as needed for heartburn or indigestion (breakthrough not controlled by Protonix).   fluticasone 50 MCG/ACT nasal spray Commonly known as: FLONASE Place 2 sprays into both nostrils daily.   mirtazapine 7.5 MG tablet Commonly known as: REMERON Take 1 tablet (7.5 mg total) by mouth at bedtime. For anxiety/ sleep   pantoprazole 40 MG tablet Commonly known as: PROTONIX Take 1 tablet (40 mg total) by mouth daily.   promethazine-dextromethorphan 6.25-15 MG/5ML syrup Commonly known as: PROMETHAZINE-DM Take 5 mLs by mouth 4 (four) times daily as needed for cough. Started by: Elige Radon Kimberlea Schlag   rosuvastatin 20 MG tablet Commonly known as: Crestor Take 1 tablet (20 mg total) by mouth daily.   sertraline 50 MG tablet Commonly known as: ZOLOFT Take 1  tablet (50 mg total) by mouth daily. What changed: how much to take Changed by: Elige Radon Nysia Dell   sucralfate 1 GM/10ML suspension Commonly known as: Carafate Take 10 mLs (1 g total) by mouth 4 (four) times daily -  with meals and at bedtime.         Objective:   BP 122/81   Pulse 75   Temp 98.8 F (37.1 C)   Ht 5\' 5"  (1.651 m)   Wt 198 lb (89.8 kg)   SpO2 95%   BMI 32.95 kg/m   Wt Readings from Last 3 Encounters:  12/05/23 198 lb (89.8 kg)  09/30/23 198 lb (89.8 kg)  03/18/23 183 lb (83 kg)    Physical Exam Vitals and nursing note reviewed.  Constitutional:      General: He is not in acute distress.    Appearance: He is well-developed. He is not diaphoretic.  HENT:     Right Ear: External ear normal.     Left Ear: External ear normal.     Nose: Nose normal.     Mouth/Throat:     Pharynx: No oropharyngeal exudate.  Eyes:     General: No scleral icterus.       Right eye: No discharge.     Conjunctiva/sclera: Conjunctivae normal.     Pupils: Pupils are equal, round, and reactive to light.  Neck:     Thyroid: No thyromegaly.  Cardiovascular:     Rate and Rhythm: Normal rate and regular rhythm.     Heart sounds: Normal heart sounds. No murmur heard. Pulmonary:     Effort: Pulmonary effort is normal. No respiratory distress.     Breath sounds: Normal breath sounds. No wheezing.  Abdominal:     General: Bowel sounds are normal. There is no distension.     Palpations: Abdomen is soft.     Tenderness: There is no abdominal tenderness. There is no guarding or rebound.  Musculoskeletal:        General: No swelling. Normal range of motion.     Cervical back: Neck supple.  Lymphadenopathy:     Cervical: No cervical adenopathy.  Skin:    General: Skin is warm and dry.     Findings: No rash.  Neurological:     Mental Status: He is alert and oriented to person, place, and time.     Coordination: Coordination normal.  Psychiatric:        Behavior: Behavior  normal.       Assessment & Plan:   Problem List Items Addressed This Visit       Other  Anxiety   Relevant Medications   sertraline (ZOLOFT) 50 MG tablet   Stress reaction   Relevant Medications   sertraline (ZOLOFT) 50 MG tablet   Other Visit Diagnoses       Physical exam    -  Primary       Continue the sertraline. Did physical exam today as well, will print AVS so he can take to insurance.  Also follow-up in 6 months for blood work Follow up plan: Return in about 6 months (around 06/04/2024), or if symptoms worsen or fail to improve, for Routine checkup.  Counseling provided for all of the vaccine components No orders of the defined types were placed in this encounter.   Arville Care, MD [facility] 12/05/2023, 11:41 AM

## 2023-12-12 ENCOUNTER — Other Ambulatory Visit: Payer: Self-pay

## 2023-12-12 ENCOUNTER — Emergency Department (HOSPITAL_COMMUNITY)
Admission: EM | Admit: 2023-12-12 | Discharge: 2023-12-13 | Disposition: A | Discharge status: Home / Self Care | Payer: BC Managed Care – PPO | Attending: Emergency Medicine | Admitting: Emergency Medicine

## 2023-12-12 DIAGNOSIS — E876 Hypokalemia: Secondary | ICD-10-CM

## 2023-12-12 DIAGNOSIS — R000 Tachycardia, unspecified: Secondary | ICD-10-CM | POA: Insufficient documentation

## 2023-12-12 DIAGNOSIS — R1013 Epigastric pain: Secondary | ICD-10-CM | POA: Diagnosis not present

## 2023-12-12 DIAGNOSIS — K219 Gastro-esophageal reflux disease without esophagitis: Secondary | ICD-10-CM

## 2023-12-12 DIAGNOSIS — K920 Hematemesis: Secondary | ICD-10-CM | POA: Diagnosis not present

## 2023-12-12 DIAGNOSIS — R17 Unspecified jaundice: Secondary | ICD-10-CM

## 2023-12-12 DIAGNOSIS — R739 Hyperglycemia, unspecified: Secondary | ICD-10-CM

## 2023-12-12 DIAGNOSIS — R7309 Other abnormal glucose: Secondary | ICD-10-CM | POA: Diagnosis not present

## 2023-12-12 DIAGNOSIS — Z7982 Long term (current) use of aspirin: Secondary | ICD-10-CM | POA: Insufficient documentation

## 2023-12-12 DIAGNOSIS — R1110 Vomiting, unspecified: Secondary | ICD-10-CM | POA: Diagnosis present

## 2023-12-12 MED ORDER — PANTOPRAZOLE SODIUM 40 MG IV SOLR
40.0000 mg | Freq: Once | INTRAVENOUS | Status: AC
  Administered 2023-12-13: 40 mg via INTRAVENOUS
  Filled 2023-12-12: qty 10

## 2023-12-12 NOTE — ED Triage Notes (Signed)
 Pt reports having cough ground like emesis x2 days. C/o "burning pain" in abdomin, weakness, and chills.   Denies taking blood thinners.

## 2023-12-12 NOTE — ED Provider Notes (Signed)
 [place] EMERGENCY DEPARTMENT AT [facility] Provider Note   CSN: [id number] Arrival date & time: 12/12/23  2248     History  Chief Complaint  Patient presents with   Hematemesis    [name] is a 63 y.o. male.  The history is provided by the patient.  He has history of hyperlipidemia, GERD and comes in complaining of epigastric pain and coffee-ground emesis for the last 2 days.  Pain has been constant.  There has been no bright red blood.  He denies NSAID use, ethanol use, anticoagulant use.  He has felt weak and lightheaded.   Home Medications Prior to Admission medications   Medication Sig Start Date End Date Taking? Authorizing Provider  albuterol  (VENTOLIN  HFA) 108 (90 Base) MCG/ACT inhaler USE 2 PUFFS EVERY 6 HOURS AS NEEDED 09/30/23   Dettinger, Fonda LABOR, MD  aspirin  81 MG chewable tablet Chew 1 tablet (81 mg total) by mouth daily. 09/08/23   Mesner, Selinda, MD  famotidine  (PEPCID ) 20 MG tablet Take 1 tablet (20 mg total) by mouth 2 (two) times daily as needed for heartburn or indigestion (breakthrough not controlled by Protonix ). 01/31/23   Jolinda Norene HERO, DO  fluticasone  (FLONASE ) 50 MCG/ACT nasal spray Place 2 sprays into both nostrils daily. 02/07/23   Gladis, Mary-Margaret, FNP  mirtazapine  (REMERON ) 7.5 MG tablet Take 1 tablet (7.5 mg total) by mouth at bedtime. For anxiety/ sleep 12/05/23   Dettinger, Fonda LABOR, MD  pantoprazole  (PROTONIX ) 40 MG tablet Take 1 tablet (40 mg total) by mouth daily. 09/30/23   Dettinger, Fonda LABOR, MD  promethazine -dextromethorphan (PROMETHAZINE -DM) 6.25-15 MG/5ML syrup Take 5 mLs by mouth 4 (four) times daily as needed for cough. 12/05/23   Dettinger, Fonda LABOR, MD  rosuvastatin  (CRESTOR ) 20 MG tablet Take 1 tablet (20 mg total) by mouth daily. 09/30/23   Dettinger, Fonda LABOR, MD  sertraline  (ZOLOFT ) 50 MG tablet Take 1 tablet (50 mg total) by mouth daily. 12/05/23   Dettinger, Fonda LABOR, MD  sucralfate  (CARAFATE ) 1 GM/10ML  suspension Take 10 mLs (1 g total) by mouth 4 (four) times daily -  with meals and at bedtime. 01/26/23   Mesner, Jason, MD      Allergies    Morphine and codeine and Bee venom    Review of Systems   Review of Systems  All other systems reviewed and are negative.   Physical Exam Updated Vital Signs BP (!) 131/102   Pulse (!) 127   Temp 99.3 F (37.4 C) (Oral)   Resp (!) 24   SpO2 91%  Physical Exam Vitals and nursing note reviewed.   63 year old male, resting comfortably and in no acute distress. Vital signs are significant for elevated heart rate, respiratory rate, slightly elevated diastolic blood pressure. Oxygen  saturation is 91%, which is normal. Head is normocephalic and atraumatic. PERRLA, EOMI. Oropharynx is clear.  Conjunctivae are pink. Neck is nontender and supple without adenopathy. Lungs are clear without rales, wheezes, or rhonchi. Chest is nontender. Heart has regular rate and rhythm without murmur. Abdomen is soft, flat, with mild epigastric tenderness.  There is no rebound or guarding. Rectal: Normal sphincter tone, small amount of brown stool which is Hemoccult negative. Extremities have no cyanosis or edema, full range of motion is present. Skin is warm and dry without rash. Neurologic: Mental status is normal, moves all extremities equally.  ED Results / Procedures / Treatments   Labs (all labs ordered are listed, but only abnormal results  are displayed) Labs Reviewed  COMPREHENSIVE METABOLIC PANEL - Abnormal; Notable for the following components:      Result Value   Potassium 3.1 (*)    Chloride 95 (*)    Glucose, Bld 140 (*)    BUN 28 (*)    Total Protein 8.5 (*)    Total Bilirubin 1.8 (*)    All other components within normal limits  CBC WITH DIFFERENTIAL/PLATELET - Abnormal; Notable for the following components:   WBC 12.0 (*)    Hemoglobin 17.9 (*)    Neutro Abs 10.3 (*)    All other components within normal limits  POC OCCULT BLOOD, ED   TYPE AND SCREEN    EKG None  Radiology No results found.  Procedures Procedures  Cardiac monitor shows sinus tachycardia, per my interpretation.  Medications Ordered in ED Medications  pantoprazole  (PROTONIX ) injection 40 mg (has no administration in time range)  sucralfate  (CARAFATE ) tablet 1 g (has no administration in time range)  pantoprazole  (PROTONIX ) injection 40 mg (40 mg Intravenous Given 12/13/23 0010)  ondansetron  (ZOFRAN ) injection 4 mg (4 mg Intravenous Given 12/13/23 0300)  alum & mag hydroxide-simeth (MAALOX/MYLANTA) 200-200-20 MG/5ML suspension 30 mL (30 mLs Oral Given 12/13/23 9388)    ED Course/ Medical Decision Making/ A&P                                 Medical Decision Making Amount and/or Complexity of Data Reviewed Labs: ordered.  Risk OTC drugs. Prescription drug management.   Coffee-ground emesis which is likely hematemesis given epigastric pain and tachycardia.  Most likely cause would be a bleeding ulcer, considered gastritis.  Doubt esophageal varices in the absence of history of cirrhosis.  I have ordered a dose of pantoprazole , screening labs of CBC and comprehensive metabolic panel as well as type and screen.  I have reviewed his laboratory tests, and my interpretation is hemoglobin is actually increased compared with baseline (17.9 compared with 15.7 on 09/30/2023), mild leukocytosis which is nonspecific, hypokalemia which is likely secondary to GI losses and will need to be replaced, mild elevation of random glucose which will need to be followed as an outpatient, elevated BUN relative to creatinine possibly consistent with GI bleed.  Heart rate has come down to normal without any hydration.  He has vomited in the emergency department and while emesis is not grossly bloody and not grossly coffee ground, it does test Hemoccult positive.  I will observe him in the emergency department and repeat hemoglobin to see if it drops.  Repeat hemoglobin is  essentially unchanged.  He has remained hemodynamically stable in the ED with normal heart rate.  He did have an episode of epigastric burning and I ordered a dose of Maalox with excellent relief of symptoms.  I suspect that he does have an ulcer which is bleeding, but not sufficient to warrant hospitalization.  I am discharging him with prescriptions for pantoprazole , sucralfate , ondansetron  oral dissolving tablet, oral potassium.  I am referring him to gastroenterology for follow-up.  I have instructed him to return if his symptoms are getting worse.  Final Clinical Impression(s) / ED Diagnoses Final diagnoses:  Hematemesis with nausea  Hypokalemia  Elevated random blood glucose level  Serum total bilirubin elevated    Rx / DC Orders ED Discharge Orders          Ordered    potassium chloride  SA (KLOR-CON  M) 20 MEQ tablet  2 times daily        12/13/23 0640    ondansetron  (ZOFRAN -ODT) 4 MG disintegrating tablet  Every 8 hours PRN        12/13/23 0640    pantoprazole  (PROTONIX ) 40 MG tablet        12/13/23 0640    sucralfate  (CARAFATE ) 1 g tablet  3 times daily with meals & bedtime        12/13/23 9357              Raford Lenis, MD 12/13/23 [phone]

## 2023-12-13 LAB — COMPREHENSIVE METABOLIC PANEL
ALT: 29 U/L (ref 0–44)
AST: 26 U/L (ref 15–41)
Albumin: 4.5 g/dL (ref 3.5–5.0)
Alkaline Phosphatase: 61 U/L (ref 38–126)
Anion gap: 14 (ref 5–15)
BUN: 28 mg/dL — ABNORMAL HIGH (ref 6–20)
CO2: 26 mmol/L (ref 22–32)
Calcium: 9.2 mg/dL (ref 8.9–10.3)
Chloride: 95 mmol/L — ABNORMAL LOW (ref 98–111)
Creatinine, Ser: 1.17 mg/dL (ref 0.61–1.24)
GFR, Estimated: >60 mL/min (ref >60)
Glucose, Bld: 140 mg/dL — ABNORMAL HIGH (ref 70–99)
Potassium: 3.1 mmol/L — ABNORMAL LOW (ref 3.5–5.1)
Sodium: 135 mmol/L (ref 135–145)
Total Bilirubin: 1.8 mg/dL — ABNORMAL HIGH (ref 0.0–1.2)
Total Protein: 8.5 g/dL — ABNORMAL HIGH (ref 6.5–8.1)

## 2023-12-13 LAB — CBC WITH DIFFERENTIAL/PLATELET
Abs Immature Granulocytes: 0.03 10*3/uL (ref 0.00–0.07)
Basophils Absolute: 0 10*3/uL (ref 0.0–0.1)
Basophils Relative: 0 %
Eosinophils Absolute: 0 10*3/uL (ref 0.0–0.5)
Eosinophils Relative: 0 %
HCT: 51.7 % (ref 39.0–52.0)
Hemoglobin: 17.9 g/dL — ABNORMAL HIGH (ref 13.0–17.0)
Immature Granulocytes: 0 %
Lymphocytes Relative: 8 %
Lymphs Abs: 1 10*3/uL (ref 0.7–4.0)
MCH: 31.4 pg (ref 26.0–34.0)
MCHC: 34.6 g/dL (ref 30.0–36.0)
MCV: 90.7 fL (ref 80.0–100.0)
Monocytes Absolute: 0.8 10*3/uL (ref 0.1–1.0)
Monocytes Relative: 6 %
Neutro Abs: 10.3 10*3/uL — ABNORMAL HIGH (ref 1.7–7.7)
Neutrophils Relative %: 86 %
Platelets: 268 10*3/uL (ref 150–400)
RBC: 5.7 MIL/uL (ref 4.22–5.81)
RDW: 14.5 % (ref 11.5–15.5)
WBC: 12 10*3/uL — ABNORMAL HIGH (ref 4.0–10.5)
nRBC: 0 % (ref 0.0–0.2)

## 2023-12-13 LAB — HEMOGLOBIN AND HEMATOCRIT, BLOOD
HCT: 52.1 % — ABNORMAL HIGH (ref 39.0–52.0)
Hemoglobin: 17.5 g/dL — ABNORMAL HIGH (ref 13.0–17.0)

## 2023-12-13 LAB — TYPE AND SCREEN
ABO/RH(D): O POS
Antibody Screen: NEGATIVE

## 2023-12-13 MED ORDER — SUCRALFATE 1 G PO TABS: 1.0000 g | ORAL_TABLET | Freq: Three times a day (TID) | ORAL | 0 refills | Status: DC

## 2023-12-13 MED ORDER — SUCRALFATE 1 G PO TABS
1.0000 g | ORAL_TABLET | Freq: Once | ORAL | Status: AC
  Administered 2023-12-13: 1 g via ORAL
  Filled 2023-12-13: qty 1

## 2023-12-13 MED ORDER — ONDANSETRON 4 MG PO TBDP: 4.0000 mg | ORAL_TABLET | Freq: Three times a day (TID) | ORAL | 0 refills | Status: DC | PRN

## 2023-12-13 MED ORDER — PANTOPRAZOLE SODIUM 40 MG PO TBEC: DELAYED_RELEASE_TABLET | ORAL | 0 refills | Status: DC

## 2023-12-13 MED ORDER — PANTOPRAZOLE SODIUM 40 MG IV SOLR
40.0000 mg | Freq: Once | INTRAVENOUS | Status: AC
  Administered 2023-12-13: 40 mg via INTRAVENOUS
  Filled 2023-12-13: qty 10

## 2023-12-13 MED ORDER — ALUM & MAG HYDROXIDE-SIMETH 200-200-20 MG/5ML PO SUSP
30.0000 mL | Freq: Once | ORAL | Status: AC
  Administered 2023-12-13: 30 mL via ORAL
  Filled 2023-12-13: qty 30

## 2023-12-13 MED ORDER — POTASSIUM CHLORIDE CRYS ER 20 MEQ PO TBCR: 20.0000 meq | EXTENDED_RELEASE_TABLET | Freq: Two times a day (BID) | ORAL | 0 refills | Status: DC

## 2023-12-13 MED ORDER — ONDANSETRON HCL 4 MG/2ML IJ SOLN
4.0000 mg | Freq: Once | INTRAMUSCULAR | Status: AC
  Administered 2023-12-13: 4 mg via INTRAVENOUS
  Filled 2023-12-13: qty 2

## 2023-12-13 NOTE — Discharge Instructions (Addendum)
 I suspect that you have an ulcer which is what is causing your bleeding.  It is very important that you take the pantoprazole  and sucralfate  exactly as I have prescribed.  You may also supplement it with antacids such as Maalox or Pepto-Bismol.  Return if your symptoms are getting worse.  It is very important that you follow-up with the gastroenterologist.  Call today for an appointment as soon as possible.

## 2023-12-19 ENCOUNTER — Ambulatory Visit: Payer: BC Managed Care – PPO

## 2023-12-19 ENCOUNTER — Telehealth: Payer: Self-pay

## 2023-12-19 ENCOUNTER — Ambulatory Visit: Payer: BC Managed Care – PPO | Admitting: Family Medicine

## 2023-12-19 ENCOUNTER — Encounter: Payer: Self-pay | Admitting: Family Medicine

## 2023-12-19 ENCOUNTER — Ambulatory Visit: Payer: BC Managed Care – PPO | Admitting: Nurse Practitioner

## 2023-12-19 VITALS — BP 142/89 | HR 77 | Ht 65.0 in | Wt 195.0 lb

## 2023-12-19 DIAGNOSIS — Z01818 Encounter for other preprocedural examination: Secondary | ICD-10-CM | POA: Diagnosis not present

## 2023-12-19 NOTE — Telephone Encounter (Signed)
 Appt scheduled with Dettinger today at 1:10. Pt made aware.

## 2023-12-19 NOTE — Telephone Encounter (Signed)
 Pt had surgical clearance apt this morning with DOD , called to get pt r/s pt states he needs to be seen today , his surgery is Wednesday ,wanted me to reach out to you and see what you would like him to do

## 2023-12-19 NOTE — Telephone Encounter (Signed)
 My 110 appointment just opened, [name] is going to block it and we can put him in there

## 2023-12-19 NOTE — Telephone Encounter (Signed)
 Is he not willing to come in later in the day with the DOD, that is the main person that has openings today?  If not I guess I can see if we have any cancellations but I am pretty packed because we rescheduled some of my morning once into the daytime as well.

## 2023-12-19 NOTE — Progress Notes (Signed)
 BP (!) 142/89   Pulse 77   Ht 5' 5 (1.651 m)   Wt 195 lb (88.5 kg)   SpO2 94%   BMI 32.45 kg/m    Subjective:   Patient ID: [name], male    DOB: [birth year], 63 y.o.   MRN: [id number]  HPI: SAMSON RALPH is a 63 y.o. male presenting on 12/19/2023 for Surgical clearance   HPI Preoperative clearance Patient is coming in today for preoperative clearance.  He is going to see Dr. Sebastian and having surgery on his right hand for Dupuytren contracture of the fifth ligament.  He says that he is able to walk up stairs and walk up to 4 or 5 miles without getting shortness of breath or chest pain.  He was recently in the emergency department with a gastrointestinal illness and he is much better from that.  His laboratory values were off because he was dehydrated but now he is hydrating and keeping food and water  down well.  He says he is having no more nausea or vomiting for the past couple days.  Relevant past medical, surgical, family and social history reviewed and updated as indicated. Interim medical history since our last visit reviewed. Allergies and medications reviewed and updated.  Review of Systems  Constitutional:  Negative for chills and fever.  Eyes:  Negative for visual disturbance.  Respiratory:  Negative for shortness of breath and wheezing.   Cardiovascular:  Negative for chest pain and leg swelling.  Musculoskeletal:  Negative for back pain and gait problem.  Skin:  Negative for rash.  Neurological:  Negative for dizziness, weakness and light-headedness.  All other systems reviewed and are negative.   Per HPI unless specifically indicated above   Allergies as of 12/19/2023       Reactions   Morphine And Codeine Swelling   Bee Venom Rash        Medication List        Accurate as of December 19, 2023  1:27 PM. If you have any questions, ask your nurse or doctor.          albuterol  108 (90 Base) MCG/ACT inhaler Commonly known as: VENTOLIN   HFA USE 2 PUFFS EVERY 6 HOURS AS NEEDED   aspirin  81 MG chewable tablet Chew 1 tablet (81 mg total) by mouth daily.   famotidine  20 MG tablet Commonly known as: Pepcid  Take 1 tablet (20 mg total) by mouth 2 (two) times daily as needed for heartburn or indigestion (breakthrough not controlled by Protonix ).   fluticasone  50 MCG/ACT nasal spray Commonly known as: FLONASE  Place 2 sprays into both nostrils daily.   mirtazapine  7.5 MG tablet Commonly known as: REMERON  Take 1 tablet (7.5 mg total) by mouth at bedtime. For anxiety/ sleep   ondansetron  4 MG disintegrating tablet Commonly known as: ZOFRAN -ODT Take 1 tablet (4 mg total) by mouth every 8 (eight) hours as needed for nausea or vomiting.   pantoprazole  40 MG tablet Commonly known as: PROTONIX  Take one tablet twice a day for two weeks, then take one tablet every day   potassium chloride  SA 20 MEQ tablet Commonly known as: KLOR-CON  M Take 1 tablet (20 mEq total) by mouth 2 (two) times daily.   promethazine -dextromethorphan 6.25-15 MG/5ML syrup Commonly known as: PROMETHAZINE -DM Take 5 mLs by mouth 4 (four) times daily as needed for cough.   rosuvastatin  20 MG tablet Commonly known as: Crestor  Take 1 tablet (20 mg total) by mouth daily.   sertraline   50 MG tablet Commonly known as: ZOLOFT  Take 1 tablet (50 mg total) by mouth daily.   sucralfate  1 g tablet Commonly known as: Carafate  Take 1 tablet (1 g total) by mouth 4 (four) times daily -  with meals and at bedtime.         Objective:   BP (!) 142/89   Pulse 77   Ht 5' 5 (1.651 m)   Wt 195 lb (88.5 kg)   SpO2 94%   BMI 32.45 kg/m   Wt Readings from Last 3 Encounters:  12/19/23 195 lb (88.5 kg)  12/05/23 198 lb (89.8 kg)  09/30/23 198 lb (89.8 kg)    Physical Exam Vitals and nursing note reviewed.  Constitutional:      General: He is not in acute distress.    Appearance: He is well-developed. He is not diaphoretic.  Eyes:     General: No scleral  icterus.    Conjunctiva/sclera: Conjunctivae normal.  Neck:     Thyroid : No thyromegaly.  Cardiovascular:     Rate and Rhythm: Normal rate and regular rhythm.     Heart sounds: Normal heart sounds. No murmur heard. Pulmonary:     Effort: Pulmonary effort is normal. No respiratory distress.     Breath sounds: Normal breath sounds. No wheezing.  Abdominal:     General: Abdomen is flat. Bowel sounds are normal. There is no distension.     Palpations: Abdomen is soft.     Tenderness: There is no abdominal tenderness. There is no guarding or rebound.  Musculoskeletal:        General: Normal range of motion.     Cervical back: Neck supple.     Comments: Dupuytren's contracture of the right hand on the fifth finger  Lymphadenopathy:     Cervical: No cervical adenopathy.  Skin:    General: Skin is warm and dry.     Findings: No rash.  Neurological:     Mental Status: He is alert and oriented to person, place, and time.     Coordination: Coordination normal.  Psychiatric:        Behavior: Behavior normal.       Assessment & Plan:   Problem List Items Addressed This Visit   None Visit Diagnoses       Preoperative clearance    -  Primary   Relevant Orders   CBC with Differential/Platelet   CMP14+EGFR       Patient will be cleared for surgery as long as his blood work comes back okay.  He was recently in the emergency department and was dehydrated and he is not having that issue anymore so that should come back better but as long as he comes back better then we should be able to clear him for surgery.  EKG and everything that he had in October looked good. Follow up plan: Return if symptoms worsen or fail to improve.  Counseling provided for all of the vaccine components Orders Placed This Encounter  Procedures   CBC with Differential/Platelet   CMP14+EGFR    Fonda Levins, MD [facility] 12/19/2023, 1:27 PM

## 2023-12-20 ENCOUNTER — Telehealth: Payer: Self-pay | Admitting: Family Medicine

## 2023-12-20 NOTE — Telephone Encounter (Signed)
 Called and spoke with Darel Hong at [facility]. Asked if she received the fax that was sent and she had not. Gave a new fax number for [place] to send it to. [phone]. Advised we would fax and to contact our office if she doesn't receive in the next 1/2 hour

## 2023-12-20 NOTE — Telephone Encounter (Signed)
 We are waiting on provider to review and a surgicial clearance form from [facility].  Left message to have form faxed to side  B.

## 2023-12-20 NOTE — Telephone Encounter (Signed)
 [name],   Will you please review pts labs from 1/13. Angelica Chessman should have the surgical clearance form. Aram Beecham was taking it to her. Thank you.

## 2023-12-20 NOTE — Telephone Encounter (Signed)
 Copied from CRM [id number]. Topic: General - Other >> Dec 20, 2023  2:33 PM Susanna ORN wrote: Reason for CRM: Dagoberto, with [employer], called stating that she faxed over the surgery clearance form this morning at 9:44 AM. States pt's surgery is scheduled for tomorrow & she's trying to keep pt's surgery from getting cancelled. She would like for nurse/provider to reach out to her to give a timeframe on completion. States she's working against time with the anesthesiologist. CB #: [phone].

## 2023-12-21 ENCOUNTER — Other Ambulatory Visit: Payer: Self-pay

## 2023-12-21 LAB — CBC WITH DIFFERENTIAL/PLATELET
Basophils Absolute: 0 10*3/uL (ref 0.0–0.2)
Basos: 0 %
EOS (ABSOLUTE): 0.2 10*3/uL (ref 0.0–0.4)
Eos: 2 %
Hematocrit: 41.7 % (ref 37.5–51.0)
Hemoglobin: 14.1 g/dL (ref 13.0–17.7)
Immature Grans (Abs): 0 10*3/uL (ref 0.0–0.1)
Immature Granulocytes: 0 %
Lymphocytes Absolute: 2.3 10*3/uL (ref 0.7–3.1)
Lymphs: 24 %
MCH: 31.9 pg (ref 26.6–33.0)
MCHC: 33.8 g/dL (ref 31.5–35.7)
MCV: 94 fL (ref 79–97)
Monocytes Absolute: 0.6 10*3/uL (ref 0.1–0.9)
Monocytes: 7 %
Neutrophils Absolute: 6.4 10*3/uL (ref 1.4–7.0)
Neutrophils: 67 %
Platelets: 264 10*3/uL (ref 150–450)
RBC: 4.42 x10E6/uL (ref 4.14–5.80)
RDW: 14.4 % (ref 11.6–15.4)
WBC: 9.6 10*3/uL (ref 3.4–10.8)

## 2023-12-21 LAB — CMP14+EGFR
ALT: 19 IU/L (ref 0–44)
AST: 19 IU/L (ref 0–40)
Albumin: 4.3 g/dL (ref 3.8–4.9)
Alkaline Phosphatase: 81 IU/L (ref 44–121)
BUN/Creatinine Ratio: 12 (ref 10–24)
BUN: 12 mg/dL (ref 8–27)
Bilirubin Total: 0.4 mg/dL (ref 0.0–1.2)
CO2: 24 mmol/L (ref 20–29)
Calcium: 9.4 mg/dL (ref 8.6–10.2)
Chloride: 103 mmol/L (ref 96–106)
Creatinine, Ser: 1.03 mg/dL (ref 0.76–1.27)
Globulin, Total: 2.2 g/dL (ref 1.5–4.5)
Glucose: 87 mg/dL (ref 70–99)
Potassium: 4.2 mmol/L (ref 3.5–5.2)
Sodium: 140 mmol/L (ref 134–144)
Total Protein: 6.5 g/dL (ref 6.0–8.5)
eGFR: 83 mL/min/{1.73_m2} (ref >59)

## 2023-12-22 LAB — SURGICAL PATHOLOGY

## 2023-12-23 ENCOUNTER — Encounter: Payer: Self-pay | Admitting: Family Medicine

## 2023-12-23 ENCOUNTER — Encounter: Payer: Self-pay | Admitting: *Deleted

## 2024-02-24 ENCOUNTER — Telehealth: Payer: Self-pay | Admitting: Family Medicine

## 2024-02-24 ENCOUNTER — Telehealth: Payer: Self-pay

## 2024-02-24 DIAGNOSIS — Z0279 Encounter for issue of other medical certificate: Secondary | ICD-10-CM

## 2024-02-24 NOTE — Telephone Encounter (Signed)
 Copied from CRM [id number]. Topic: Medical Record Request - Other >> Feb 24, 2024 11:25 AM Geroge Baseman wrote: Reason for CRM: FMLA paper work is about to be sent over, patient would like a call from the office when it is received, he also needs to find out what the office needs from him in regard to getting this form completed. Please call mobile number in chart to follow up with patient.

## 2024-02-24 NOTE — Telephone Encounter (Signed)
 [employer] faxed FMLA forms to be completed  Form Fee Paid? (Y/N)       NO PT AWARE TO PAY. HE WILL COME MONDAY     If NO, form is placed on front office manager desk to hold until payment received. If YES, then form will be placed in the RX/HH Nurse Coordinators box for completion.  Form will not be processed until payment is received

## 2024-02-28 ENCOUNTER — Telehealth: Payer: Self-pay | Admitting: Family Medicine

## 2024-02-28 NOTE — Telephone Encounter (Signed)
 FMLA ppw on PCP desk

## 2024-02-28 NOTE — Telephone Encounter (Signed)
 [name] dropped off FMLA forms to be completed and signed.  Form Fee Paid? (Y/N)      yes      If NO, form is placed on front office manager desk to hold until payment received. If YES, then form will be placed in the RX/HH Nurse Coordinators box for completion.  Form will not be processed until payment is received   Please fax, make copy for pt & call him, when copy is ready!

## 2024-03-02 NOTE — Telephone Encounter (Signed)
 PCP completed and signed FMLA forms. They have been faxed to [facility] at fax number [phone]. Patient has been contacted and informed they are complete. Copy at front desk.

## 2024-03-30 ENCOUNTER — Ambulatory Visit: Admitting: Nurse Practitioner

## 2024-03-30 ENCOUNTER — Encounter: Payer: Self-pay | Admitting: Nurse Practitioner

## 2024-03-30 VITALS — BP 125/77 | HR 80 | Temp 98.2°F | Ht 65.0 in | Wt 196.0 lb

## 2024-03-30 DIAGNOSIS — J301 Allergic rhinitis due to pollen: Secondary | ICD-10-CM

## 2024-03-30 MED ORDER — PREDNISONE 20 MG PO TABS: 40.0000 mg | ORAL_TABLET | Freq: Every day | ORAL | 0 refills | Status: AC

## 2024-03-30 NOTE — Progress Notes (Signed)
 Subjective:    Patient ID: [name], male    DOB: 04-Aug-1961, 63 y.o.   MRN: [id number]   Chief Complaint: Sinus Problem (Cough/Mowing Monday and usually wears a mask but didn't this time and has had issues since)   Sinus Problem This is a new problem. The current episode started in the past 7 days. The problem has been waxing and waning since onset. There has been no fever. His pain is at a severity of 6/10. The pain is mild. Associated symptoms include congestion, coughing, headaches, sinus pressure, sneezing and a sore throat. Pertinent negatives include no chills. Treatments tried: flonase . The treatment provided mild relief.    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  HENT:  Positive for congestion, sinus pressure, sneezing and sore throat.   Respiratory:  Positive for cough.   Neurological:  Positive for headaches.       Objective:   Physical Exam Constitutional:      Appearance: Normal appearance. He is obese.  HENT:     Right Ear: Tympanic membrane normal.     Left Ear: Tympanic membrane normal.     Nose: Congestion and rhinorrhea present.     Right Sinus: No maxillary sinus tenderness or frontal sinus tenderness.     Left Sinus: No maxillary sinus tenderness or frontal sinus tenderness.     Mouth/Throat:     Pharynx: No oropharyngeal exudate or posterior oropharyngeal erythema.  Cardiovascular:     Rate and Rhythm: Normal rate and regular rhythm.     Heart sounds: Normal heart sounds.  Pulmonary:     Breath sounds: Normal breath sounds.  Musculoskeletal:     Cervical back: Normal range of motion and  neck supple.  Skin:    General: Skin is warm.  Neurological:     General: No focal deficit present.     Mental Status: He is alert and oriented to person, place, and time.  Psychiatric:        Mood and Affect: Mood normal.        Behavior: Behavior normal.     BP 125/77   Pulse 80   Temp 98.2 F (36.8 C) (Temporal)   Ht 5\' 5"  (1.651 m)   Wt 196 lb (88.9 kg)   SpO2 95%   BMI 32.62 kg/m        Assessment & Plan:  [name] in today with chief complaint of Sinus Problem (Cough/Mowing Monday and usually wears a mask but didn't this time and has had issues since)   1. Seasonal allergic rhinitis due to pollen (Primary) Flonase  in mornings Claritin  at night Force fluids Avoid allergens Wear a mask when mowing yard  Meds ordered this encounter  Medications   predniSONE  (DELTASONE ) 20 MG tablet    Sig: Take 2 tablets (40 mg total) by mouth daily with breakfast for 5 days. 2 po daily for 5 days    Dispense:  10 tablet    Refill:  0    Supervising Provider:   Jolyne Needs A [[id number]]       The above assessment and management plan was  discussed with the patient. The patient verbalized understanding of and has agreed to the management plan. Patient is aware to call the clinic if symptoms persist or worsen. Patient is aware when to return to the clinic for a follow-up visit. Patient educated on when it is appropriate to go to the emergency department.   Mary-Margaret Gaylyn Keas, FNP

## 2024-03-30 NOTE — Patient Instructions (Signed)
 Allergic Rhinitis, Adult  Allergic rhinitis is a reaction to allergens. Allergens are things that can cause an allergic reaction. This condition affects the lining inside the nose (mucous membrane). There are two types of allergic rhinitis: Seasonal. This type is also called hay fever. It happens only during some times of the year. Perennial. This type can happen at any time of the year. This condition cannot be spread from person to person (is not contagious). It can be mild, bad, or very bad. It can develop at any age and may be outgrown. What are the causes? Pollen from grasses, trees, and weeds. Other causes can be: Dust mites. Smoke. Mold. Car fumes. The pee (urine), spit, or dander of pets. Dander is dead skin cells from a pet. What increases the risk? You are more likely to develop this condition if: You have allergies in your family. You have problems like allergies in your family. You may have: Swelling of parts of your eyes and eyelids. Asthma. This affects how you breathe. Long-term redness and swelling on your skin. Food allergies. What are the signs or symptoms? The main symptom of this condition is a runny or stuffy nose (nasal congestion). Other symptoms may include: Sneezing or coughing. Itching and tearing of your eyes. Mucus that drips down the back of your throat (postnasal drip). This may cause a sore throat. Trouble sleeping. Feeling tired. Headache. How is this treated? There is no cure for this condition. You should avoid things that you are allergic to. Treatment can help to relieve symptoms. This may include: Medicines that block allergy symptoms, such as anti-inflammatories or antihistamines. These may be given as a shot, nasal spray, or pill. Avoiding things you are allergic to. Medicines that give you some of what you are allergic to over time. This is called immunotherapy. It is done if other treatments do not help. You may get: Shots. Medicine under  your tongue. Stronger medicines, if other treatments do not help. Follow these instructions at home: Avoiding allergens Find out what things you are allergic to and avoid them. To do this, try these things: If you get allergies any time of year: Replace carpet with wood, tile, or vinyl flooring. Carpet can trap pet dander and dust. Do not smoke. Do not allow smoking in your home. Change your heating and air conditioning filters at least once a month. If you get allergies only some times of the year: Keep windows closed when you can. Plan things to do outside when pollen counts are lowest. Check pollen counts before you plan things to do outside. When you come indoors, change your clothes and shower before you sit on furniture or bedding. If you are allergic to a pet: Keep the pet out of your bedroom. Vacuum, sweep, and dust often. General instructions Take over-the-counter and prescription medicines only as told by your doctor. Drink enough fluid to keep your pee pale yellow. Where to find more information American Academy of Allergy, Asthma & Immunology: aaaai.org Contact a doctor if: You have a fever. You get a cough that does not go away. You make high-pitched whistling sounds when you breathe, most often when you breathe out (wheeze). Your symptoms slow you down. Your symptoms stop you from doing your normal things each day. Get help right away if: You are short of breath. This symptom may be an emergency. Get help right away. Call 911. Do not wait to see if the symptom will go away. Do not drive yourself to the  hospital. This information is not intended to replace advice given to you by your health care provider. Make sure you discuss any questions you have with your health care provider. Document Revised: 08/02/2022 Document Reviewed: 08/02/2022 Elsevier Patient Education  2024 [employer].

## 2024-04-25 ENCOUNTER — Ambulatory Visit (INDEPENDENT_AMBULATORY_CARE_PROVIDER_SITE_OTHER)

## 2024-04-25 ENCOUNTER — Encounter: Payer: Self-pay | Admitting: Family Medicine

## 2024-04-25 ENCOUNTER — Ambulatory Visit: Admitting: Family Medicine

## 2024-04-25 VITALS — BP 132/80 | HR 88 | Temp 98.2°F | Ht 65.0 in | Wt 192.0 lb

## 2024-04-25 DIAGNOSIS — R053 Chronic cough: Secondary | ICD-10-CM

## 2024-04-25 MED ORDER — BENZONATATE 200 MG PO CAPS: 200.0000 mg | ORAL_CAPSULE | Freq: Two times a day (BID) | ORAL | 0 refills | Status: DC | PRN

## 2024-04-25 MED ORDER — PREDNISONE 20 MG PO TABS: ORAL_TABLET | ORAL | 0 refills | Status: DC

## 2024-04-25 NOTE — Progress Notes (Signed)
 BP 132/80   Pulse 88   Temp 98.2 F (36.8 C)   Ht 5\' 5"  (1.651 m)   Wt 192 lb (87.1 kg)   SpO2 95%   BMI 31.95 kg/m    Subjective:   Patient ID: [name], male    DOB: 21-Jan-1961, 63 y.o.   MRN: [id number]  HPI: [name] is a 63 y.o. male presenting on 04/25/2024 for Cough (Present for 4 weeks. Has been prescribed oral and injectable steroids. Breathing tx's as well. Pt states that he uses his inhaler. Cough in non productive. Feels like something is stuck mid chest.)   HPI Persistent cough Patient is coming in today for persistent cough.  He was treated for a bacterial upper respiratory infection about 4 weeks ago and given doxycycline  and some albuterol  inhaler breathing treatments and he has been using his albuterol  inhaler.  He still feels like he is getting postnasal drainage and then has had a cough.  He says he has a mild shortness of breath on exertion.  He says the cough is much worse in the morning and he will wake up with it and then will get somewhat better through the day.  He says it is almost feels like it is getting worse again.  He denies any fevers or chills or bodyaches.  He does take antiacid medicine and denies having any difficulties with reflux or belching or burping or gas.  Relevant past medical, surgical, family and social history reviewed and updated as indicated. Interim medical history since our last visit reviewed. Allergies and medications reviewed and updated.  Review of Systems  Constitutional:  Negative for chills and fever.  HENT:  Positive for congestion and trouble swallowing (With some foods and liquids).   Eyes:  Negative for discharge.  Respiratory:  Positive for cough and shortness of breath (On exertion). Negative for wheezing.   Cardiovascular:  Negative for chest pain and leg swelling.  Skin:  Negative for rash.  All other systems reviewed and are negative.   Per HPI unless specifically indicated above   Allergies as of  04/25/2024       Reactions   Morphine And Codeine Swelling   Bee Venom Rash        Medication List        Accurate as of Apr 25, 2024 11:41 AM. If you have any questions, ask your nurse or doctor.          albuterol  108 (90 Base) MCG/ACT inhaler Commonly known as: VENTOLIN  HFA USE 2 PUFFS EVERY 6 HOURS AS NEEDED   aspirin  81 MG chewable tablet Chew 1 tablet (81 mg total) by mouth daily.   benzonatate  200 MG capsule Commonly known as: TESSALON  Take 1 capsule (200 mg total) by mouth 2 (two) times daily as needed for cough. Started by: Lucio Sabin Yandriel Boening   famotidine  20 MG tablet Commonly known as: Pepcid  Take 1 tablet (20 mg total) by mouth 2 (two) times daily as needed for heartburn or indigestion (breakthrough not controlled by Protonix ).   fluticasone  50 MCG/ACT nasal spray Commonly known as: FLONASE  Place 2 sprays into both nostrils daily.   mirtazapine  7.5 MG tablet Commonly known as: REMERON  Take 1 tablet (7.5 mg total) by mouth at bedtime. For anxiety/ sleep   ondansetron  4 MG disintegrating tablet Commonly known as: ZOFRAN -ODT Take 1 tablet (4 mg total) by mouth every 8 (eight) hours as needed for nausea or vomiting.   pantoprazole  40 MG tablet  Commonly known as: PROTONIX  Take one tablet twice a day for two weeks, then take one tablet every day   potassium chloride  SA 20 MEQ tablet Commonly known as: KLOR-CON  M Take 1 tablet (20 mEq total) by mouth 2 (two) times daily.   predniSONE  20 MG tablet Commonly known as: DELTASONE  2 po at same time daily for 5 days Started by: Lucio Sabin Apollos Tenbrink   rosuvastatin  20 MG tablet Commonly known as: Crestor  Take 1 tablet (20 mg total) by mouth daily.   sertraline  50 MG tablet Commonly known as: ZOLOFT  Take 1 tablet (50 mg total) by mouth daily.   sucralfate  1 g tablet Commonly known as: Carafate  Take 1 tablet (1 g total) by mouth 4 (four) times daily -  with meals and at bedtime.         Objective:    BP 132/80   Pulse 88   Temp 98.2 F (36.8 C)   Ht 5\' 5"  (1.651 m)   Wt 192 lb (87.1 kg)   SpO2 95%   BMI 31.95 kg/m   Wt Readings from Last 3 Encounters:  04/25/24 192 lb (87.1 kg)  03/30/24 196 lb (88.9 kg)  12/19/23 195 lb (88.5 kg)    Physical Exam Vitals and nursing note reviewed.  Constitutional:      General: He is not in acute distress.    Appearance: He is well-developed. He is not diaphoretic.  HENT:     Mouth/Throat:     Mouth: Mucous membranes are dry.     Pharynx: Oropharynx is clear.  Eyes:     General: No scleral icterus.    Conjunctiva/sclera: Conjunctivae normal.  Neck:     Thyroid : No thyromegaly.  Cardiovascular:     Rate and Rhythm: Normal rate and regular rhythm.     Heart sounds: Normal heart sounds. No murmur heard. Pulmonary:     Effort: Pulmonary effort is normal. No respiratory distress.     Breath sounds: Normal breath sounds. No wheezing.  Musculoskeletal:        General: Normal range of motion.     Cervical back: Neck supple.  Lymphadenopathy:     Cervical: No cervical adenopathy.  Skin:    General: Skin is warm and dry.     Findings: No rash.  Neurological:     Mental Status: He is alert and oriented to person, place, and time.     Coordination: Coordination normal.  Psychiatric:        Behavior: Behavior normal.    Chest x-ray: No signs of fluid or consolidation, looks to be improved from the x-ray on 4/28.  Await final read from radiology.   Assessment & Plan:   Problem List Items Addressed This Visit   None Visit Diagnoses       Persistent cough for 3 weeks or longer    -  Primary   Relevant Medications   predniSONE  (DELTASONE ) 20 MG tablet   benzonatate  (TESSALON ) 200 MG capsule   Other Relevant Orders   DG Chest 2 View     Likely persistent cough secondary to inflammation, will do prednisone  for anti-inflammatory.  Will also give him some Tessalon  Perles.  Follow up plan: Return if symptoms worsen or fail to  improve.  Counseling provided for all of the vaccine components Orders Placed This Encounter  Procedures   DG Chest 2 View    Jolyne Needs, MD [facility] 04/25/2024, 11:41 AM

## 2024-05-02 ENCOUNTER — Ambulatory Visit: Payer: Self-pay | Admitting: Family Medicine

## 2024-06-04 ENCOUNTER — Ambulatory Visit: Payer: BC Managed Care – PPO | Admitting: Family Medicine

## 2024-06-06 ENCOUNTER — Ambulatory Visit: Payer: BC Managed Care – PPO | Admitting: Family Medicine

## 2024-06-13 ENCOUNTER — Encounter: Payer: Self-pay | Admitting: Family Medicine

## 2024-06-13 ENCOUNTER — Ambulatory Visit: Payer: BC Managed Care – PPO | Admitting: Family Medicine

## 2024-06-13 VITALS — BP 119/81 | HR 75 | Ht 65.0 in | Wt 190.0 lb

## 2024-06-13 DIAGNOSIS — F430 Acute stress reaction: Secondary | ICD-10-CM

## 2024-06-13 DIAGNOSIS — F419 Anxiety disorder, unspecified: Secondary | ICD-10-CM | POA: Diagnosis not present

## 2024-06-13 DIAGNOSIS — K219 Gastro-esophageal reflux disease without esophagitis: Secondary | ICD-10-CM

## 2024-06-13 DIAGNOSIS — E782 Mixed hyperlipidemia: Secondary | ICD-10-CM

## 2024-06-13 MED ORDER — FAMOTIDINE 20 MG PO TABS: 20.0000 mg | ORAL_TABLET | Freq: Two times a day (BID) | ORAL | 3 refills | Status: AC | PRN

## 2024-06-13 NOTE — Progress Notes (Signed)
 BP 119/81   Pulse 75   Ht 5' 5 (1.651 m)   Wt 190 lb (86.2 kg)   SpO2 94%   BMI 31.62 kg/m    Subjective:   Patient ID: [name], male    DOB: 1961/08/01, 63 y.o.   MRN: [id number]  HPI: [name] is a 63 y.o. male presenting on 06/13/2024 for Medical Management of Chronic Issues and Hyperlipidemia   HPI Hyperlipidemia Patient is coming in for recheck of his hyperlipidemia. The patient is currently taking crestor . They deny any issues with myalgias or history of liver damage from it. They deny any focal numbness or weakness or chest pain.   GERD Patient is currently on pantoprazole  and famotidine .  She denies any major symptoms or abdominal pain or belching or burping. She denies any blood in her stool or lightheadedness or dizziness.   Anxiety and stress reaction Patient is coming in today for anxiety and stress recheck.  Currently takes mirtazapine  in the evening and Zoloft  during the daytime.  He feels like he is doing very well and says that if he does not take it then his wife notices.  With that though his anxiety is under control.    06/13/2024   10:58 AM 04/25/2024   10:53 AM 03/30/2024   10:35 AM 12/19/2023    1:10 PM 12/05/2023   11:29 AM  Depression screen PHQ 2/9  Decreased Interest 0 0 0 0 0  Down, Depressed, Hopeless 0 0 0 0 0  PHQ - 2 Score 0 0 0 0 0  Altered sleeping 0 0   0  Tired, decreased energy 1 1   0  Change in appetite 0 0   0  Feeling bad or failure about yourself  0 0   0  Trouble concentrating 0 0   0  Moving slowly or fidgety/restless 0 0   0  Suicidal thoughts 0 0   0  PHQ-9 Score 1 1   0  Difficult doing work/chores Not difficult at all Not difficult at all   Not difficult at all     Relevant past medical, surgical, family and social history reviewed and updated as indicated. Interim medical history since our last visit reviewed. Allergies and medications reviewed and updated.  Review of Systems  Constitutional:  Negative for  chills and fever.  Eyes:  Negative for discharge.  Respiratory:  Negative for shortness of breath and wheezing.   Cardiovascular:  Negative for chest pain and leg swelling.  Gastrointestinal:  Negative for abdominal pain.  Musculoskeletal:  Negative for back pain and gait problem.  Skin:  Negative for rash.  Neurological:  Negative for dizziness and light-headedness.  Psychiatric/Behavioral:  Positive for sleep disturbance. Negative for dysphoric mood, self-injury and suicidal ideas. The patient is nervous/anxious.   All other systems reviewed and are negative.   Per HPI unless specifically indicated above   Allergies as of 06/13/2024       Reactions   Morphine And Codeine Swelling   Bee Venom Rash        Medication List        Accurate as of June 13, 2024 11:10 AM. If you have any questions, ask your nurse or doctor.          STOP taking these medications    benzonatate  200 MG capsule Commonly known as: TESSALON  Stopped by: Fonda LABOR Nic Lampe   fluticasone  50 MCG/ACT nasal spray Commonly known as: FLONASE  Stopped by: Fonda  A Dijon Cosens   predniSONE  20 MG tablet Commonly known as: DELTASONE  Stopped by: Fonda LABOR Carrol Hougland       TAKE these medications    albuterol  108 (90 Base) MCG/ACT inhaler Commonly known as: VENTOLIN  HFA USE 2 PUFFS EVERY 6 HOURS AS NEEDED   aspirin  81 MG chewable tablet Chew 1 tablet (81 mg total) by mouth daily.   famotidine  20 MG tablet Commonly known as: Pepcid  Take 1 tablet (20 mg total) by mouth 2 (two) times daily as needed for heartburn or indigestion (breakthrough not controlled by Protonix ).   mirtazapine  7.5 MG tablet Commonly known as: REMERON  Take 1 tablet (7.5 mg total) by mouth at bedtime. For anxiety/ sleep   ondansetron  4 MG disintegrating tablet Commonly known as: ZOFRAN -ODT Take 1 tablet (4 mg total) by mouth every 8 (eight) hours as needed for nausea or vomiting.   pantoprazole  40 MG tablet Commonly known as:  PROTONIX  Take one tablet twice a day for two weeks, then take one tablet every day   potassium chloride  SA 20 MEQ tablet Commonly known as: KLOR-CON  M Take 1 tablet (20 mEq total) by mouth 2 (two) times daily.   rosuvastatin  20 MG tablet Commonly known as: Crestor  Take 1 tablet (20 mg total) by mouth daily.   sertraline  50 MG tablet Commonly known as: ZOLOFT  Take 1 tablet (50 mg total) by mouth daily.   sucralfate  1 g tablet Commonly known as: Carafate  Take 1 tablet (1 g total) by mouth 4 (four) times daily -  with meals and at bedtime.         Objective:   BP 119/81   Pulse 75   Ht 5' 5 (1.651 m)   Wt 190 lb (86.2 kg)   SpO2 94%   BMI 31.62 kg/m   Wt Readings from Last 3 Encounters:  06/13/24 190 lb (86.2 kg)  04/25/24 192 lb (87.1 kg)  03/30/24 196 lb (88.9 kg)    Physical Exam Vitals and nursing note reviewed.  Constitutional:      General: He is not in acute distress.    Appearance: He is well-developed. He is not diaphoretic.  Eyes:     General: No scleral icterus.    Conjunctiva/sclera: Conjunctivae normal.  Neck:     Thyroid : No thyromegaly.  Cardiovascular:     Rate and Rhythm: Normal rate and regular rhythm.     Heart sounds: Normal heart sounds. No murmur heard. Pulmonary:     Effort: Pulmonary effort is normal. No respiratory distress.     Breath sounds: Normal breath sounds. No wheezing.  Abdominal:     General: Abdomen is flat. Bowel sounds are normal. There is no distension.     Palpations: Abdomen is soft.     Tenderness: There is no abdominal tenderness. There is no guarding.  Musculoskeletal:        General: Normal range of motion.     Cervical back: Neck supple.  Lymphadenopathy:     Cervical: No cervical adenopathy.  Skin:    General: Skin is warm and dry.     Findings: No rash.  Neurological:     Mental Status: He is alert and oriented to person, place, and time.     Coordination: Coordination normal.  Psychiatric:         Behavior: Behavior normal.       Assessment & Plan:   Problem List Items Addressed This Visit       Digestive   Esophageal reflux  Relevant Medications   famotidine  (PEPCID ) 20 MG tablet   Other Relevant Orders   CBC with Differential/Platelet     Other   Mixed hyperlipidemia - Primary   Relevant Orders   CBC with Differential/Platelet   CMP14+EGFR   Lipid panel   Anxiety   Relevant Orders   CBC with Differential/Platelet   Stress reaction    Will do blood work today, blood pressure and everything else seems to be doing well.  No changes. Follow up plan: Return in about 6 months (around 12/14/2024), or if symptoms worsen or fail to improve, for Physical exam.  Counseling provided for all of the vaccine components Orders Placed This Encounter  Procedures   CBC with Differential/Platelet   CMP14+EGFR   Lipid panel    Fonda Levins, MD [facility] 06/13/2024, 11:10 AM

## 2024-06-14 LAB — CMP14+EGFR
ALT: 20 IU/L (ref 0–44)
AST: 21 IU/L (ref 0–40)
Albumin: 4.5 g/dL (ref 3.9–4.9)
Alkaline Phosphatase: 88 IU/L (ref 44–121)
BUN/Creatinine Ratio: 14 (ref 10–24)
BUN: 15 mg/dL (ref 8–27)
Bilirubin Total: 0.6 mg/dL (ref 0.0–1.2)
CO2: 25 mmol/L (ref 20–29)
Calcium: 9.5 mg/dL (ref 8.6–10.2)
Chloride: 102 mmol/L (ref 96–106)
Creatinine, Ser: 1.1 mg/dL (ref 0.76–1.27)
Globulin, Total: 2.6 g/dL (ref 1.5–4.5)
Glucose: 101 mg/dL — ABNORMAL HIGH (ref 70–99)
Potassium: 4.4 mmol/L (ref 3.5–5.2)
Sodium: 140 mmol/L (ref 134–144)
Total Protein: 7.1 g/dL (ref 6.0–8.5)
eGFR: 76 mL/min/1.73 (ref >59)

## 2024-06-14 LAB — CBC WITH DIFFERENTIAL/PLATELET
Basophils Absolute: 0.1 x10E3/uL (ref 0.0–0.2)
Basos: 1 %
EOS (ABSOLUTE): 0.1 x10E3/uL (ref 0.0–0.4)
Eos: 1 %
Hematocrit: 48 % (ref 37.5–51.0)
Hemoglobin: 15.5 g/dL (ref 13.0–17.7)
Immature Grans (Abs): 0 x10E3/uL (ref 0.0–0.1)
Immature Granulocytes: 0 %
Lymphocytes Absolute: 2.3 x10E3/uL (ref 0.7–3.1)
Lymphs: 25 %
MCH: 31.3 pg (ref 26.6–33.0)
MCHC: 32.3 g/dL (ref 31.5–35.7)
MCV: 97 fL (ref 79–97)
Monocytes Absolute: 0.6 x10E3/uL (ref 0.1–0.9)
Monocytes: 7 %
Neutrophils Absolute: 6 x10E3/uL (ref 1.4–7.0)
Neutrophils: 66 %
Platelets: 272 x10E3/uL (ref 150–450)
RBC: 4.95 x10E6/uL (ref 4.14–5.80)
RDW: 14.6 % (ref 11.6–15.4)
WBC: 9.1 x10E3/uL (ref 3.4–10.8)

## 2024-06-14 LAB — LIPID PANEL
Chol/HDL Ratio: 5.8 ratio — ABNORMAL HIGH (ref 0.0–5.0)
Cholesterol, Total: 225 mg/dL — ABNORMAL HIGH (ref 100–199)
HDL: 39 mg/dL — ABNORMAL LOW (ref >39)
LDL Chol Calc (NIH): 155 mg/dL — ABNORMAL HIGH (ref 0–99)
Triglycerides: 171 mg/dL — ABNORMAL HIGH (ref 0–149)
VLDL Cholesterol Cal: 31 mg/dL (ref 5–40)

## 2024-06-18 ENCOUNTER — Ambulatory Visit: Payer: Self-pay | Admitting: Family Medicine

## 2024-06-19 ENCOUNTER — Other Ambulatory Visit: Payer: Self-pay

## 2024-06-19 DIAGNOSIS — K4090 Unilateral inguinal hernia, without obstruction or gangrene, not specified as recurrent: Secondary | ICD-10-CM

## 2024-07-27 ENCOUNTER — Encounter: Payer: Self-pay | Admitting: Radiology

## 2024-09-20 ENCOUNTER — Other Ambulatory Visit: Payer: Self-pay | Admitting: Family Medicine

## 2024-09-20 DIAGNOSIS — K219 Gastro-esophageal reflux disease without esophagitis: Secondary | ICD-10-CM

## 2024-09-27 ENCOUNTER — Ambulatory Visit: Payer: Self-pay

## 2024-09-27 NOTE — Telephone Encounter (Signed)
 Noted. Apt made. LS

## 2024-09-27 NOTE — Telephone Encounter (Signed)
 Pt unavailable for same day. States wheezing is only present while coughing and resolves at rest. ED precautions reviewed, wife verbalized understanding.   FYI Only or Action Required?: FYI only for provider.  Patient was last seen in primary care on 06/13/2024 by Dettinger, Fonda LABOR, MD.  Called Nurse Triage reporting Cough.  Symptoms began several days ago.  Interventions attempted: Rest, hydration, or home remedies.  Symptoms are: unchanged.  Triage Disposition: See HCP Within 4 Hours (Or PCP Triage)  Patient/caregiver understands and will follow disposition?: Not available for same day Reason for Disposition  Wheezing is present  Answer Assessment - Initial Assessment Questions Reports coughing with wheezing x 3-4 days ago. States gets bronchitis around this time of year, usually tx with rx cough medicine and inhaler. Wife not sure if pt has used his inhaler.   1. ONSET: When did the cough begin?      3-4 days  2. SEVERITY: How bad is the cough today?      Feels like train hit him but was able to work  3. SPUTUM: Describe the color of your sputum (e.g., none, dry cough; clear, white, yellow, green)     Denies  4. HEMOPTYSIS: Are you coughing up any blood? If Yes, ask: How much? (e.g., flecks, streaks, tablespoons, etc.)     Denies  5. DIFFICULTY BREATHING: Are you having difficulty breathing? If Yes, ask: How bad is it? (e.g., mild, moderate, severe)      Wheezing when cough is severe  6. FEVER: Do you have a fever? If Yes, ask: What is your temperature, how was it measured, and when did it start?     Denies  7. CARDIAC HISTORY: Do you have any history of heart disease? (e.g., heart attack, congestive heart failure)      Denies   8. LUNG HISTORY: Do you have any history of lung disease?  (e.g., pulmonary embolus, asthma, emphysema)     Denies  9. PE RISK FACTORS: Do you have a history of blood clots? (or: recent major surgery, recent  prolonged travel, bedridden)     Denies  10. OTHER SYMPTOMS: Do you have any other symptoms? (e.g., runny nose, wheezing, chest pain)       Wheezing  Protocols used: Cough - Acute Non-Productive-A-AH Copied from CRM [id number]. Topic: Clinical - Red Word Triage >> Sep 27, 2024  9:23 AM Tobias CROME wrote: Red Word that prompted transfer to Nurse Triage: wheezing, coughing, bronchitis flare up

## 2024-09-28 ENCOUNTER — Encounter: Payer: Self-pay | Admitting: Family

## 2024-09-28 ENCOUNTER — Ambulatory Visit: Admitting: Family

## 2024-09-28 VITALS — BP 93/61 | HR 71 | Temp 97.7°F | Ht 65.0 in | Wt 189.0 lb

## 2024-09-28 DIAGNOSIS — J209 Acute bronchitis, unspecified: Secondary | ICD-10-CM | POA: Diagnosis not present

## 2024-09-28 MED ORDER — PREDNISONE 20 MG PO TABS: 40.0000 mg | ORAL_TABLET | Freq: Every day | ORAL | 0 refills | Status: AC

## 2024-09-28 MED ORDER — FLUTICASONE PROPIONATE 50 MCG/ACT NA SUSP: 2.0000 | Freq: Every day | NASAL | 6 refills | Status: AC

## 2024-09-28 MED ORDER — CETIRIZINE HCL 10 MG PO TABS: 10.0000 mg | ORAL_TABLET | Freq: Every day | ORAL | 1 refills | Status: DC

## 2024-09-28 MED ORDER — BENZONATATE 200 MG PO CAPS: 200.0000 mg | ORAL_CAPSULE | Freq: Three times a day (TID) | ORAL | 1 refills | Status: DC | PRN

## 2024-09-28 MED ORDER — PROMETHAZINE-DM 6.25-15 MG/5ML PO SYRP: 5.0000 mL | ORAL_SOLUTION | Freq: Three times a day (TID) | ORAL | 0 refills | Status: DC | PRN

## 2024-09-28 NOTE — Patient Instructions (Signed)

## 2024-09-28 NOTE — Progress Notes (Signed)
 22  Subjective:    Patient ID: [name], male    DOB: 1961-06-14, 63 y.o.   MRN: [id number]  Chief Complaint  Patient presents with   Cough   Pt presents to the office today with a cough that started 3 days ago.  Cough This is a new problem. The current episode started in the past 7 days. The problem has been unchanged. The problem occurs every few minutes. The cough is Non-productive. Associated symptoms include headaches and shortness of breath. Pertinent negatives include no chest pain, chills, ear congestion, ear pain, fever, myalgias, nasal congestion, postnasal drip, sore throat or wheezing. He has tried rest (motrin ) for the symptoms. The treatment provided mild relief.      Review of Systems  Constitutional:  Negative for chills and fever.  HENT:  Negative for ear pain, postnasal drip and sore throat.   Respiratory:  Positive for cough and shortness of breath. Negative for wheezing.   Cardiovascular:  Negative for chest pain.  Musculoskeletal:  Negative for myalgias.  Neurological:  Positive for headaches.  All other systems reviewed and are negative.   Social History   Socioeconomic History   Marital status: Married    Spouse name: Not on file   Number of children: Not on file   Years of education: Not on file   Highest education level: 9th grade  Occupational History   Not on file  Tobacco Use   Smoking status: Never   Smokeless tobacco: Never  Vaping Use   Vaping status: Never Used  Substance and Sexual Activity   Alcohol use: Yes    Alcohol/week: 4.0 standard drinks of alcohol    Types: 4 Cans of beer per week    Comment: occ   Drug use: No   Sexual activity: Yes    Birth control/protection: None    Comment: married 2005  Other Topics Concern   Not on file  Social History Narrative   Not on file   Social Drivers of Health   Financial Resource Strain: Low Risk  (09/27/2024)   Overall Financial Resource Strain (CARDIA)    Difficulty of Paying  Living Expenses: Not hard at all  Food Insecurity: No Food Insecurity (09/27/2024)   Hunger Vital Sign    Worried About Running Out of Food in the Last Year: Never true    Ran Out of Food in the Last Year: Never true  Transportation Needs: No Transportation Needs (09/27/2024)   PRAPARE - Administrator, Civil Service (Medical): No    Lack of Transportation (Non-Medical): No  Physical Activity: Insufficiently Active (09/27/2024)   Exercise Vital Sign    Days of Exercise per Week: 3 days    Minutes of Exercise per Session: 30 min  Stress: No Stress Concern Present (09/27/2024)   [employer] of Occupational Health - Occupational Stress Questionnaire    Feeling of Stress: Not at all  Social Connections: Moderately Isolated (09/27/2024)   Social Connection and Isolation Panel    Frequency of Communication with Friends and Family: More than three times a week    Frequency of Social Gatherings with Friends and Family: Twice a week    Attends Religious Services: Never    Database administrator or Organizations: No    Attends Engineer, structural: Not on file    Marital Status: Married   Family History  Problem Relation Age of Onset   Stroke Mother    Cancer Mother  colon   Alzheimer's disease Father        61 years   Heart disease Father        atrial fibri   Heart disease Sister    Early death Brother    Cancer Maternal Grandmother         Objective:   Physical Exam Vitals reviewed.  Constitutional:      General: He is not in acute distress.    Appearance: He is well-developed.  HENT:     Head: Normocephalic.     Right Ear: Tympanic membrane normal.     Left Ear: Tympanic membrane normal.  Eyes:     General:        Right eye: No discharge.        Left eye: No discharge.     Pupils: Pupils are equal, round, and reactive to light.  Neck:     Thyroid : No thyromegaly.  Cardiovascular:     Rate and Rhythm: Normal rate and regular rhythm.      Heart sounds: Normal heart sounds. No murmur heard. Pulmonary:     Effort: Pulmonary effort is normal. No respiratory distress.     Breath sounds: Normal breath sounds. No wheezing.     Comments: Dry nonproductive cough Abdominal:     General: Bowel sounds are normal. There is no distension.     Palpations: Abdomen is soft.     Tenderness: There is no abdominal tenderness.  Musculoskeletal:        General: No tenderness. Normal range of motion.     Cervical back: Normal range of motion and neck supple.  Skin:    General: Skin is warm and dry.     Findings: No erythema or rash.  Neurological:     Mental Status: He is alert and oriented to person, place, and time.     Cranial Nerves: No cranial nerve deficit.     Deep Tendon Reflexes: Reflexes are normal and symmetric.  Psychiatric:        Behavior: Behavior normal.        Thought Content: Thought content normal.        Judgment: Judgment normal.       BP 93/61   Pulse 71   Temp 97.7 F (36.5 C) (Temporal)   Ht 5' 5 (1.651 m)   Wt 189 lb (85.7 kg)   SpO2 94%   BMI 31.45 kg/m      Assessment & Plan:  [name] comes in today with chief complaint of Cough   Diagnosis and orders addressed:  1. Acute bronchitis, unspecified organism (Primary) - Take meds as prescribed - Use a cool mist humidifier  -Use saline nose sprays frequently -Force fluids -For any cough or congestion  Use plain Mucinex - regular strength or max strength is fine -For fever or aces or pains- take tylenol  or ibuprofen . -Throat lozenges if help -Follow up if symptoms worsen or do not improve - benzonatate  (TESSALON ) 200 MG capsule; Take 1 capsule (200 mg total) by mouth 3 (three) times daily as needed.  Dispense: 30 capsule; Refill: 1 - promethazine -dextromethorphan (PROMETHAZINE -DM) 6.25-15 MG/5ML syrup; Take 5 mLs by mouth 3 (three) times daily as needed for cough.  Dispense: 118 mL; Refill: 0 - fluticasone  (FLONASE ) 50 MCG/ACT nasal  spray; Place 2 sprays into both nostrils daily.  Dispense: 16 g; Refill: 6 - cetirizine  (ZYRTEC  ALLERGY) 10 MG tablet; Take 1 tablet (10 mg total) by mouth daily.  Dispense: 90 tablet; Refill:  1 - predniSONE  (DELTASONE ) 20 MG tablet; Take 2 tablets (40 mg total) by mouth daily with breakfast for 5 days.  Dispense: 10 tablet; Refill: 0     Bari Learn, FNP

## 2024-10-03 NOTE — Progress Notes (Signed)
 Assessment and Plan:   Assessment & Plan Upper respiratory tract infection, unspecified type  Cough and dyspnea unresponsive to Tessalon  Perles and steroids suggest bronchitis. No fever or other systemic symptoms.  - Administer breathing treatment. - Prescribe steroid taper. - Advise inhaler use every 4-6 hours as needed. - Prescribe Augmentin for upper respiratory infection. - Advise follow-up with primary care in 3-5 days or sooner if symptoms worsen. Orders: .  amoxicillin -clavulanate (AUGMENTIN) 875-125 mg per tablet; Take 1 tablet by mouth every twelve (12) hours for 10 days.  Bronchitis  Orders: .  albuterol  2.5 mg /3 mL (0.083 %) nebulizer solution 2.5 mg .  ipratropium (ATROVENT ) 0.02 % nebulizer solution 500 mcg .  predniSONE  (DELTASONE ) 10 MG tablet; Take 6 tablets (60 mg total) by mouth daily for 1 day, THEN 5 tablets (50 mg total) daily for 1 day, THEN 4 tablets (40 mg total) daily for 1 day, THEN 3 tablets (30 mg total) daily for 1 day, THEN 2 tablets (20 mg total) daily for 1 day, THEN 1 tablet (10 mg total) daily for 1 day.  Fever, unspecified fever cause  Orders: .  POCT SARS/FLU/RSV - RN OBTAIN      The following portions of the patient's history were reviewed and updated as appropriate: allergies, current medications, past family history, past medical history, past social history, past surgical history and problem list.   Subjective:   Patient ID: [name] is a 64 y.o. male Chief Complaint  Patient presents with  . Cough    Began last Monday--saw PCP Friday and given pearl drops and steroid.  SABRA Shortness of Breath  . Nasal Congestion    Clear mucus  . Headache    Usually with cough     History of Present Illness He presents with cough, intermittent shortness of breath, and nasal congestion.He has had a persistent cough for over five days without improvement from Tessalon  Perles and a steroid. There is no sputum production. Intermittent shortness  of breath and chest tightness occur even at rest. He experiences similar symptoms annually, which he associates with bronchitis/ He completed a five-day course of steroids and has not used other over-the-counter medications. He denies ear pain, throat pain, nausea, vomiting, diarrhea, fevers, chills, leg swelling, or palpitations.   Cough This is a new problem. The current episode started 1 to 4 weeks ago. The problem has been unchanged. The cough is Non-productive. Associated symptoms include nasal congestion. He has tried oral steroids for the symptoms. The treatment provided no relief. His past medical history is significant for bronchitis.    ROS: Negative unless otherwise noted in HPI.  Objective:   Vital Signs:  BP 109/68 (BP Site: L Arm, BP Position: Sitting, BP Cuff Size: X-Large)   Pulse 100   Temp 36.9 C (98.5 F) (Oral)   Resp 16   Ht 165.1 cm (5' 5)   Wt 87.4 kg (192 lb 9.6 oz)   SpO2 97%   BMI 32.05 kg/m   Body mass index is 32.05 kg/m. No LMP for male patient.  Results for orders placed or performed in visit on 10/03/24  POCT SARS/Flu/RSV  Result Value Ref Range   POCT SARS-CoV-2 NAA Negative Negative   Influenza A Negative Negative   Influenza B Negative Negative   POC Rapid RSV, NAA Negative Negative     Physical Exam Vitals and nursing note reviewed.  Constitutional:      General: He is not in acute distress.  Appearance: Normal appearance. He is normal weight. He is not ill-appearing or toxic-appearing.  HENT:     Head: Normocephalic.     Right Ear: Tympanic membrane, ear canal and external ear normal.     Left Ear: Tympanic membrane, ear canal and external ear normal.     Nose: Congestion and rhinorrhea present.     Mouth/Throat:     Mouth: Mucous membranes are moist.     Pharynx: Oropharynx is clear. No posterior oropharyngeal erythema.  Eyes:     Extraocular Movements: Extraocular movements intact.     Conjunctiva/sclera: Conjunctivae normal.      Pupils: Pupils are equal, round, and reactive to light.  Cardiovascular:     Rate and Rhythm: Normal rate and regular rhythm.     Heart sounds: Normal heart sounds.  Pulmonary:     Effort: Pulmonary effort is normal. No respiratory distress.     Breath sounds: No stridor. Wheezing present. No rhonchi or rales.     Comments: DuoNeb completed in office with resolution of wheezes Chest:     Chest wall: No tenderness.  Abdominal:     General: Abdomen is flat.     Palpations: Abdomen is soft.  Musculoskeletal:        General: Normal range of motion.     Cervical back: Normal range of motion.  Skin:    General: Skin is warm and dry.     Findings: No rash.  Neurological:     General: No focal deficit present.     Mental Status: He is alert and oriented to person, place, and time. Mental status is at baseline.  Psychiatric:        Mood and Affect: Mood normal.        Behavior: Behavior normal.        Thought Content: Thought content normal.        Judgment: Judgment normal.       PHQ-2 Score: 0  PHQ-9 Score:    Screening complete, no depression identified / no further action needed today   Follow-up as Needed  and Follow-up with PCP  The use of abridge was used to help in the completion of this note. Patient was educated and verbalized consent of its use.   Disposition Upon Discharge:  1.  Routine symptom specific, illness specific and/or disease specific instructions were discussed with the patient and/or caregiver at length.  2.  The differential diagnosis for the patient's specific symptoms were reviewed and discussed with the patient/caregiver at length; the patient/caregiver expressed understanding of all discussed and their relevant questions were all satisfactorily answered.  3.  Return to care should the presenting symptoms recur, persist, or worsen in any way; or in the alternative, if new symptoms or complaints develop.  4.  The patient and any family present  were given verbal and/or written discharge instructions as clinically indicated and appropriate.  5.  Continue OTC medications as needed and tolerated for control of the currently reported symptoms, if no conflict with the currently prescribed medications.

## 2024-10-03 NOTE — Progress Notes (Signed)
 Tobacco Use: Low Risk  (10/03/2024)   Patient History   . Smoking Tobacco Use: Never   . Smokeless Tobacco Use: Never   . Passive Exposure: Never

## 2024-10-06 ENCOUNTER — Encounter (HOSPITAL_COMMUNITY): Payer: Self-pay | Admitting: Emergency Medicine

## 2024-10-06 ENCOUNTER — Emergency Department (HOSPITAL_COMMUNITY)

## 2024-10-06 ENCOUNTER — Other Ambulatory Visit: Payer: Self-pay

## 2024-10-06 ENCOUNTER — Emergency Department (HOSPITAL_COMMUNITY)
Admission: EM | Admit: 2024-10-06 | Discharge: 2024-10-06 | Disposition: A | Discharge status: Home / Self Care | Attending: Emergency Medicine | Admitting: Emergency Medicine

## 2024-10-06 DIAGNOSIS — Z7982 Long term (current) use of aspirin: Secondary | ICD-10-CM | POA: Diagnosis not present

## 2024-10-06 DIAGNOSIS — R519 Headache, unspecified: Secondary | ICD-10-CM | POA: Diagnosis not present

## 2024-10-06 DIAGNOSIS — J069 Acute upper respiratory infection, unspecified: Secondary | ICD-10-CM | POA: Insufficient documentation

## 2024-10-06 DIAGNOSIS — R059 Cough, unspecified: Secondary | ICD-10-CM | POA: Diagnosis present

## 2024-10-06 LAB — CBC WITH DIFFERENTIAL/PLATELET
Abs Immature Granulocytes: 0.08 K/uL — ABNORMAL HIGH (ref 0.00–0.07)
Basophils Absolute: 0 K/uL (ref 0.0–0.1)
Basophils Relative: 0 %
Eosinophils Absolute: 0 K/uL (ref 0.0–0.5)
Eosinophils Relative: 0 %
HCT: 47.6 % (ref 39.0–52.0)
Hemoglobin: 16.3 g/dL (ref 13.0–17.0)
Immature Granulocytes: 1 %
Lymphocytes Relative: 8 %
Lymphs Abs: 1 K/uL (ref 0.7–4.0)
MCH: 32.9 pg (ref 26.0–34.0)
MCHC: 34.2 g/dL (ref 30.0–36.0)
MCV: 96 fL (ref 80.0–100.0)
Monocytes Absolute: 0.5 K/uL (ref 0.1–1.0)
Monocytes Relative: 4 %
Neutro Abs: 11.3 K/uL — ABNORMAL HIGH (ref 1.7–7.7)
Neutrophils Relative %: 87 %
Platelets: 267 K/uL (ref 150–400)
RBC: 4.96 MIL/uL (ref 4.22–5.81)
RDW: 14.3 % (ref 11.5–15.5)
WBC: 12.9 K/uL — ABNORMAL HIGH (ref 4.0–10.5)
nRBC: 0 % (ref 0.0–0.2)

## 2024-10-06 LAB — COMPREHENSIVE METABOLIC PANEL WITH GFR
ALT: 13 U/L (ref 0–44)
AST: 15 U/L (ref 15–41)
Albumin: 4.7 g/dL (ref 3.5–5.0)
Alkaline Phosphatase: 70 U/L (ref 38–126)
Anion gap: 11 (ref 5–15)
BUN: 20 mg/dL (ref 8–23)
CO2: 28 mmol/L (ref 22–32)
Calcium: 9 mg/dL (ref 8.9–10.3)
Chloride: 98 mmol/L (ref 98–111)
Creatinine, Ser: 1.03 mg/dL (ref 0.61–1.24)
GFR, Estimated: >60 mL/min (ref >60)
Glucose, Bld: 107 mg/dL — ABNORMAL HIGH (ref 70–99)
Potassium: 4.3 mmol/L (ref 3.5–5.1)
Sodium: 137 mmol/L (ref 135–145)
Total Bilirubin: 0.8 mg/dL (ref 0.0–1.2)
Total Protein: 7.3 g/dL (ref 6.5–8.1)

## 2024-10-06 LAB — RESP PANEL BY RT-PCR (RSV, FLU A&B, COVID)  RVPGX2
Influenza A by PCR: NEGATIVE
Influenza B by PCR: NEGATIVE
Resp Syncytial Virus by PCR: NEGATIVE
SARS Coronavirus 2 by RT PCR: NEGATIVE

## 2024-10-06 LAB — PROTIME-INR
INR: 1 (ref 0.8–1.2)
Prothrombin Time: 13.3 s (ref 11.4–15.2)

## 2024-10-06 LAB — LACTIC ACID, PLASMA: Lactic Acid, Venous: 2.4 mmol/L (ref 0.5–1.9)

## 2024-10-06 MED ORDER — DIPHENHYDRAMINE HCL 50 MG/ML IJ SOLN
25.0000 mg | Freq: Once | INTRAMUSCULAR | Status: AC
  Administered 2024-10-06: 25 mg via INTRAVENOUS
  Filled 2024-10-06: qty 1

## 2024-10-06 MED ORDER — SODIUM CHLORIDE 0.9 % IV BOLUS
1000.0000 mL | Freq: Once | INTRAVENOUS | Status: AC
  Administered 2024-10-06: 1000 mL via INTRAVENOUS

## 2024-10-06 MED ORDER — ACETAMINOPHEN 325 MG PO TABS
650.0000 mg | ORAL_TABLET | Freq: Once | ORAL | Status: AC
  Administered 2024-10-06: 650 mg via ORAL
  Filled 2024-10-06: qty 2

## 2024-10-06 MED ORDER — METOCLOPRAMIDE HCL 5 MG/ML IJ SOLN
10.0000 mg | Freq: Once | INTRAMUSCULAR | Status: AC
Start: 2024-10-06 — End: 2024-10-06
  Administered 2024-10-06: 10 mg via INTRAVENOUS
  Filled 2024-10-06: qty 2

## 2024-10-06 MED ORDER — PROCHLORPERAZINE EDISYLATE 10 MG/2ML IJ SOLN
10.0000 mg | Freq: Once | INTRAMUSCULAR | Status: AC
  Administered 2024-10-06: 10 mg via INTRAVENOUS
  Filled 2024-10-06: qty 2

## 2024-10-06 MED ORDER — KETOROLAC TROMETHAMINE 15 MG/ML IJ SOLN
15.0000 mg | Freq: Once | INTRAMUSCULAR | Status: AC
  Administered 2024-10-06: 15 mg via INTRAVENOUS
  Filled 2024-10-06: qty 1

## 2024-10-06 NOTE — ED Triage Notes (Signed)
 Pt to the ED with complaints of a cough for over a week.  Pt has been to primary care, urgent care and now presents with a pounding headache. Pt received a breathing treatment today from UC.  Pt has been prescribed Augmentin and a prednisone  taper.

## 2024-10-06 NOTE — Progress Notes (Signed)
 Assessment and Plan:   Assessment & Plan Bronchitis Acute respiratory symptoms (cough, shortness of breath, chest tightness) Persistent symptoms despite antibiotics and steroids suggest possible underlying issues. - Administer nebulizer treatment to alleviate chest tightness. Orders: .  albuterol  2.5 mg /3 mL (0.083 %) nebulizer solution 2.5 mg .  ipratropium (ATROVENT ) 0.02 % nebulizer solution 500 mcg  Acute nonintractable headache, unspecified headache type Headache not present at this time.     Vision changes Blurred vision New blurred vision is concerning with dizziness and requires further evaluation. - Refer to emergency department for further evaluation and management.    Intermittent palpitations  Dizziness and vertigo Intermittent dizziness and vertigo with room spinning and blurred vision are concerning due to lack of improvement. - Refer to emergency department for further evaluation and management. - Perform EKG prior to transfer to emergency department.  Orders: .  ECG 12 Lead       The following portions of the patient's history were reviewed and updated as appropriate: allergies, current medications, past family history, past medical history, past social history, past surgical history and problem list.   Subjective:   Patient ID: [name] is a 63 y.o. male Chief Complaint  Patient presents with  . Cough    Non-productive cough, ongoing.   SABRA Headache    Feels a pounding in his head, not triggered by coughing. States he almost fell out at work yesterday because the pounding headache lead to dizziness.  . Shortness of Breath     History of Present Illness [name] is a 63 year old male who presents with nonproductive cough, headache, and shortness of breath.He experiences a worsening nonproductive cough, headache, and shortness of breath despite taking Augmentin and a prednisone  taper. He uses an inhaler every 4-6 hours without relief. He  feels pressure in his head, similar to ears popping, with dizziness and a sensation of the room spinning, especially at work. Episodes of dizziness occur when walking, with a sensation of heart racing. He denies heart problems, arrhythmias, or chest pain. He has experienced vertigo in the past but notes this episode is different, with constant ear popping and a pounding headache. He reports vision changes, describing it as 'fuzzy' with difficulty seeing clearly. He feels febrile and has been swabbed for COVID, flu, and RSV.     Cough This is a new problem. The current episode started 1 to 4 weeks ago. The problem has been unchanged. The cough is Non-productive. Associated symptoms include ear pain, headaches, shortness of breath and wheezing. Pertinent negatives include no chest pain, fever or rash.  Headache  This is a new problem. The current episode started yesterday. The problem occurs intermittently. The pain is located in the Bilateral region. The pain does not radiate. Associated symptoms include blurred vision, coughing, dizziness, ear pain, sinus pressure and a visual change. Pertinent negatives include no fever, hearing loss, phonophobia, photophobia or vomiting.  Shortness of Breath This is a new problem. The current episode started in the past 7 days. The problem occurs intermittently. Associated symptoms include ear pain, headaches and wheezing. Pertinent negatives include no chest pain, fever, leg swelling, orthopnea, rash or vomiting.    ROS: Negative unless otherwise noted in HPI.  Objective:   Vital Signs:  BP 122/70 (BP Site: R Arm, BP Position: Sitting)   Pulse 85   Temp 37 C (98.6 F) (Temporal)   Resp 20   Ht 165.1 cm (5' 5)   Wt 87.4 kg (192  lb 9.6 oz)   SpO2 96%   BMI 32.05 kg/m   Body mass index is 32.05 kg/m. No LMP for male patient.  No results found for this visit on 10/06/24.   Physical Exam  Follow-up as Needed , Follow-up with PCP, and Transferred  to ED Patient Disposition  Indication for Disposition: Requires a higher level of care  Chief Complaint:shortness of breath, vision changes intermittent headache and palpitations.    [name] given instructions to proceed to ED via personal vehicle; Location: [name] due to Requires a higher level of care, testing, and/or treatment not able to be provided at Urgent Care Patient is being transported by his wife.   The use of abridge was used to help in the completion of this note. Patient was educated and verbalized consent of its use.   Disposition Upon Discharge:  1.  Routine symptom specific, illness specific and/or disease specific instructions were discussed with the patient and/or caregiver at length.  2.  The differential diagnosis for the patient's specific symptoms were reviewed and discussed with the patient/caregiver at length; the patient/caregiver expressed understanding of all discussed and their relevant questions were all satisfactorily answered.  3.  Return to care should the presenting symptoms recur, persist, or worsen in any way; or in the alternative, if new symptoms or complaints develop.  4.  The patient and any family present were given verbal and/or written discharge instructions as clinically indicated and appropriate.  5.  Continue OTC medications as needed and tolerated for control of the currently reported symptoms, if no conflict with the currently prescribed medications.

## 2024-10-06 NOTE — ED Notes (Signed)
 Date and time results received: 10/06/24 1517  Test: Lactic Acid Critical Value: 2.4  Name of Provider Notified: Charlyn Sora, MD

## 2024-10-06 NOTE — Discharge Instructions (Signed)
 We saw you in the ER for headaches, nausea, persistent cough. The workup in the ER is overall reassuring.  There is no evidence of pneumonia, brain bleed and flu, COVID test remains negative.  We are not sure what is causing your symptoms. The workup in the ER is not complete, and is limited to screening for life threatening and emergent conditions only, so please see a primary care doctor for further evaluation.  Please return to the ER if the headache gets severe and in not improving, you have associated new one sided numbness, tingling, weakness or confusion, seizures, poor balance or poor vision.

## 2024-10-06 NOTE — ED Provider Notes (Signed)
 [facility] AT [facility] Provider Note   CSN: [id number] Arrival date & time: 10/06/24  1114     Patient presents with: Cough   [name] is a 63 y.o. male.   HPI    63 year old male comes in with chief complaint of cough, congestion, fevers and now headaches.  Patient has been unwell for the last 2 weeks.  Yesterday he started having sweats and also started noticing some headaches.  He has seen PCP, urgent care.  He has been put on antibiotics and also given steroids, with no relief.  Given that he started having some sweats and then headaches today, he decided to come to the ER.  Patient denies any neck pain. Pt has no associated nausea, vomiting, seizures, loss of consciousness or new visual complains, weakness, numbness, dizziness or gait instability.   Prior to Admission medications   Medication Sig Start Date End Date Taking? Authorizing Provider  albuterol  (VENTOLIN  HFA) 108 (90 Base) MCG/ACT inhaler USE 2 PUFFS EVERY 6 HOURS AS NEEDED 09/30/23   Dettinger, Fonda LABOR, MD  aspirin  81 MG chewable tablet Chew 1 tablet (81 mg total) by mouth daily. 09/08/23   Mesner, Selinda, MD  benzonatate  (TESSALON ) 200 MG capsule Take 1 capsule (200 mg total) by mouth 3 (three) times daily as needed. 09/28/24   Lavell Bari LABOR, FNP  cetirizine  (ZYRTEC  ALLERGY) 10 MG tablet Take 1 tablet (10 mg total) by mouth daily. 09/28/24   Lavell Bari LABOR, FNP  famotidine  (PEPCID ) 20 MG tablet Take 1 tablet (20 mg total) by mouth 2 (two) times daily as needed for heartburn or indigestion (breakthrough not controlled by Protonix ). 06/13/24   Dettinger, Fonda LABOR, MD  fluticasone  (FLONASE ) 50 MCG/ACT nasal spray Place 2 sprays into both nostrils daily. 09/28/24   Lavell Bari A, FNP  mirtazapine  (REMERON ) 7.5 MG tablet Take 1 tablet (7.5 mg total) by mouth at bedtime. For anxiety/ sleep 12/05/23   Dettinger, Fonda LABOR, MD  ondansetron  (ZOFRAN -ODT) 4 MG disintegrating tablet  Take 1 tablet (4 mg total) by mouth every 8 (eight) hours as needed for nausea or vomiting. 12/13/23   Raford Lenis, MD  pantoprazole  (PROTONIX ) 40 MG tablet TAKE ONE (1) TABLET BY MOUTH EVERY DAY 09/20/24   Dettinger, Fonda LABOR, MD  potassium chloride  SA (KLOR-CON  M) 20 MEQ tablet Take 1 tablet (20 mEq total) by mouth 2 (two) times daily. 12/13/23   Raford Lenis, MD  promethazine -dextromethorphan (PROMETHAZINE -DM) 6.25-15 MG/5ML syrup Take 5 mLs by mouth 3 (three) times daily as needed for cough. 09/28/24   Lavell Bari A, FNP  rosuvastatin  (CRESTOR ) 20 MG tablet Take 1 tablet (20 mg total) by mouth daily. 09/30/23   Dettinger, Fonda LABOR, MD  sertraline  (ZOLOFT ) 50 MG tablet Take 1 tablet (50 mg total) by mouth daily. 12/05/23   Dettinger, Fonda LABOR, MD  sucralfate  (CARAFATE ) 1 g tablet Take 1 tablet (1 g total) by mouth 4 (four) times daily -  with meals and at bedtime. 12/13/23   Raford Lenis, MD    Allergies: Morphine and codeine and Bee venom    Review of Systems  All other systems reviewed and are negative.   Updated Vital Signs BP 125/82   Pulse 62   Temp 98.3 F (36.8 C) (Oral)   Resp 20   Ht 5' 5 (1.651 m)   Wt 85.7 kg   SpO2 94%   BMI 31.45 kg/m   Physical Exam Vitals and nursing note reviewed.  Constitutional:      Appearance: He is well-developed.  HENT:     Head: Atraumatic.  Eyes:     Extraocular Movements: Extraocular movements intact.     Pupils: Pupils are equal, round, and reactive to light.  Neck:     Comments: No meningismus Cardiovascular:     Rate and Rhythm: Normal rate.  Pulmonary:     Effort: Pulmonary effort is normal.  Musculoskeletal:     Cervical back: Neck supple.  Skin:    General: Skin is warm.  Neurological:     Mental Status: He is alert and oriented to person, place, and time.  Psychiatric:        Mood and Affect: Mood normal.     (all labs ordered are listed, but only abnormal results are displayed) Labs Reviewed  LACTIC ACID,  PLASMA - Abnormal; Notable for the following components:      Result Value   Lactic Acid, Venous 2.4 (*)    All other components within normal limits  COMPREHENSIVE METABOLIC PANEL WITH GFR - Abnormal; Notable for the following components:   Glucose, Bld 107 (*)    All other components within normal limits  CBC WITH DIFFERENTIAL/PLATELET - Abnormal; Notable for the following components:   WBC 12.9 (*)    Neutro Abs 11.3 (*)    Abs Immature Granulocytes 0.08 (*)    All other components within normal limits  CULTURE, BLOOD (ROUTINE X 2)  CULTURE, BLOOD (ROUTINE X 2)  RESP PANEL BY RT-PCR (RSV, FLU A&B, COVID)  [id number]  PROTIME-INR  LACTIC ACID, PLASMA    EKG: EKG Interpretation Date/Time:  Saturday October 06 2024 14:54:37 EDT Ventricular Rate:  70 PR Interval:  119 QRS Duration:  78 QT Interval:  405 QTC Calculation: 437 R Axis:   46  Text Interpretation: Sinus rhythm Borderline short PR interval Abnormal R-wave progression, early transition No acute changes Confirmed by Charlyn Sora ([id number]) on 10/06/2024 4:09:56 PM  Radiology: CT Chest Wo Contrast Result Date: 10/06/2024 EXAM: CT CHEST WITHOUT CONTRAST 10/06/2024 02:26:10 PM TECHNIQUE: CT of the chest was performed without the administration of intravenous contrast. Multiplanar reformatted images are provided for review. Automated exposure control, iterative reconstruction, and/or weight based adjustment of the mA/kV was utilized to reduce the radiation dose to as low as reasonably achievable. COMPARISON: Chest radiograph 01/27/2024 and CT 08/27/2023. CLINICAL HISTORY: Respiratory illness, nondiagnostic xray. FINDINGS: MEDIASTINUM: Heart and pericardium are unremarkable. The central airways are clear. LYMPH NODES: No mediastinal, hilar or axillary lymphadenopathy. LUNGS AND PLEURA: Likely mild bibasilar subsegmental atelectasis. No suspicious pulmonary nodules or masses. No focal consolidation. No pulmonary edema. No pleural  effusion or pneumothorax. SOFT TISSUES/BONES: No acute abnormality of the bones or soft tissues. UPPER ABDOMEN: Limited images of the upper abdomen demonstrates no acute abnormality. IMPRESSION: 1. No acute intrathoracic pathology. Electronically signed by: Michaeline Blanch MD 10/06/2024 02:45 PM EDT RP Workstation: [id number]   CT Head Wo Contrast Result Date: 10/06/2024 EXAM: CT HEAD WITHOUT CONTRAST 10/06/2024 02:26:10 PM TECHNIQUE: CT of the head was performed without the administration of intravenous contrast. Automated exposure control, iterative reconstruction, and/or weight based adjustment of the mA/kV was utilized to reduce the radiation dose to as low as reasonably achievable. COMPARISON: Head CT dated 06/06/2014. CLINICAL HISTORY: Headache, new onset (Age >= 51y). FINDINGS: BRAIN AND VENTRICLES: No acute hemorrhage. No evidence of acute infarct. No hydrocephalus. No extra-axial collection. No mass effect or midline shift. ORBITS: No acute abnormality. SINUSES: No acute abnormality. SOFT TISSUES  AND SKULL: No acute soft tissue abnormality. No skull fracture. IMPRESSION: 1. No acute intracranial abnormality. Electronically signed by: Franky Stanford MD 10/06/2024 02:35 PM EDT RP Workstation: [id number]     Procedures   Medications Ordered in the ED  ketorolac  (TORADOL ) 15 MG/ML injection 15 mg (has no administration in time range)  metoCLOPramide (REGLAN) injection 10 mg (has no administration in time range)  acetaminophen  (TYLENOL ) tablet 650 mg (has no administration in time range)  prochlorperazine (COMPAZINE) injection 10 mg (10 mg Intravenous Given 10/06/24 1456)  diphenhydrAMINE (BENADRYL) injection 25 mg (25 mg Intravenous Given 10/06/24 1456)  sodium chloride  0.9 % bolus 1,000 mL (0 mLs Intravenous Stopped 10/06/24 1557)    Clinical Course as of 10/06/24 1612  Sat Oct 06, 2024  1612 CT Head Wo Contrast Independently assessed, no evidence of brain bleed. [AN]  1612 CT Chest Wo  Contrast Independently interpreted, no evidence of pneumothorax or focal consolidation. [AN]    Clinical Course User Index [AN] Charlyn Sora, MD                                 Medical Decision Making Amount and/or Complexity of Data Reviewed Labs: ordered. Radiology: ordered.  Risk OTC drugs. Prescription drug management.   63 year old male comes in with chief complaint of fevers, sweats, persistent cough for the last several days with URI-like symptoms and also now headache.  He does not have any significant past medical history.  Patient is immunocompetent and does not have any active cancer.  Patient has nonfocal neuroexam.  Differential diagnosis includes severe electrolyte abnormality, viral URI, postviral cough, superimposed pneumonia, bacteremia.  Patient's cardiac and pulmonary exam is reassuring.  He has had previous x-rays that were normal.  O2 sats is about 92%.  I will get a CT without contrast of the chest along with the brain.  CT brain ordered given that patient is over the age of 99 and has a new headache.  The headache does not have any concerning red flags, but it is moderate to severe and it is new for the patient.  He has no history of clotting, the headaches started yesterday therefore we will not get CT venogram.   If the workup in the emergency room is reassuring, then we will discharge the patient.   Reassessment: Upon reassessment, patient reports that the headache has improved significantly. he continued to have no neurologic complains.  Results of the ER workup discussed with the patient.  His labs are completely normal.  CT chest does not show any evidence of infection.  CT head is negative for bleed or any mass.  Strict return precautions discussed, pt will return to the ER if there is visual complains, seizures, altered mental status, loss of consciousness, dizziness, new focal weakness, or numbness.   Final diagnoses:  Viral URI with cough     ED Discharge Orders     None          Charlyn Sora, MD 10/06/24 1612

## 2024-10-08 ENCOUNTER — Encounter: Payer: Self-pay | Admitting: Radiology

## 2024-10-10 ENCOUNTER — Telehealth: Admitting: Physician Assistant

## 2024-10-10 DIAGNOSIS — B9689 Other specified bacterial agents as the cause of diseases classified elsewhere: Secondary | ICD-10-CM | POA: Diagnosis not present

## 2024-10-10 DIAGNOSIS — J208 Acute bronchitis due to other specified organisms: Secondary | ICD-10-CM | POA: Diagnosis not present

## 2024-10-11 LAB — CULTURE, BLOOD (ROUTINE X 2)
Culture: NO GROWTH
Culture: NO GROWTH
Special Requests: ADEQUATE

## 2024-10-11 MED ORDER — BENZONATATE 100 MG PO CAPS: 100.0000 mg | ORAL_CAPSULE | Freq: Three times a day (TID) | ORAL | 0 refills | Status: DC | PRN

## 2024-10-11 MED ORDER — AZITHROMYCIN 250 MG PO TABS: ORAL_TABLET | ORAL | 0 refills | Status: DC

## 2024-10-11 NOTE — Progress Notes (Signed)
 We are sorry that you are not feeling well.  Here is how we plan to help!  Based on your presentation I believe you most likely have A cough due to bacteria.  When patients have a fever and a productive cough with a change in color or increased sputum production, we are concerned about bacterial bronchitis.  If left untreated it can progress to pneumonia.  If your symptoms do not improve with your treatment plan it is important that you contact your provider.   I have prescribed Azithromyin 250 mg: two tablets now and then one tablet daily for 4 additonal days    In addition you may use A non-prescription cough medication called Mucinex DM: take 2 tablets every 12 hours. and A prescription cough medication called Tessalon  Perles 100mg . You may take 1-2 capsules every 8 hours as needed for your cough.   From your responses in the eVisit questionnaire you describe inflammation in the upper respiratory tract which is causing a significant cough.  This is commonly called Bronchitis and has four common causes:   Allergies Viral Infections Acid Reflux Bacterial Infection Allergies, viruses and acid reflux are treated by controlling symptoms or eliminating the cause. An example might be a cough caused by taking certain blood pressure medications. You stop the cough by changing the medication. Another example might be a cough caused by acid reflux. Controlling the reflux helps control the cough.  USE OF BRONCHODILATOR (RESCUE) INHALERS: There is a risk from using your bronchodilator too frequently.  The risk is that over-reliance on a medication which only relaxes the muscles surrounding the breathing tubes can reduce the effectiveness of medications prescribed to reduce swelling and congestion of the tubes themselves.  Although you feel brief relief from the bronchodilator inhaler, your asthma may actually be worsening with the tubes becoming more swollen and filled with mucus.  This can delay other  crucial treatments, such as oral steroid medications. If you need to use a bronchodilator inhaler daily, several times per day, you should discuss this with your provider.  There are probably better treatments that could be used to keep your asthma under control.     HOME CARE Only take medications as instructed by your medical team. Complete the entire course of an antibiotic. Drink plenty of fluids and get plenty of rest. Avoid close contacts especially the very young and the elderly Cover your mouth if you cough or cough into your sleeve. Always remember to wash your hands A steam or ultrasonic humidifier can help congestion.   GET HELP RIGHT AWAY IF: You develop worsening fever. You become short of breath You cough up blood. Your symptoms persist after you have completed your treatment plan MAKE SURE YOU  Understand these instructions. Will watch your condition. Will get help right away if you are not doing well or get worse.  Your e-visit answers were reviewed by a board certified advanced clinical practitioner to complete your personal care plan.  Depending on the condition, your plan could have included both over the counter or prescription medications. If there is a problem please reply  once you have received a response from your provider. Your safety is important to us .  If you have drug allergies check your prescription carefully.    You can use MyChart to ask questions about today's visit, request a non-urgent call back, or ask for a work or school excuse for 24 hours related to this e-Visit. If it has been greater than 24 hours  will need to follow up with your provider, or enter a new e-Visit to address those concerns. You will get an e-mail in the next two days asking about your experience.  I hope that your e-visit has been valuable and will speed your recovery. Thank you for using e-visits.   I have spent 5 minutes in review of e-visit questionnaire, review and updating  patient chart, medical decision making and response to patient.   Delon CHRISTELLA Dickinson, PA-C

## 2024-10-12 ENCOUNTER — Ambulatory Visit

## 2024-10-12 ENCOUNTER — Encounter: Payer: Self-pay | Admitting: Family Medicine

## 2024-10-12 ENCOUNTER — Ambulatory Visit: Admitting: Family Medicine

## 2024-10-12 VITALS — BP 103/66 | HR 93 | Temp 101.7°F | Ht 65.0 in | Wt 186.8 lb

## 2024-10-12 DIAGNOSIS — R509 Fever, unspecified: Secondary | ICD-10-CM | POA: Diagnosis not present

## 2024-10-12 DIAGNOSIS — Z8709 Personal history of other diseases of the respiratory system: Secondary | ICD-10-CM

## 2024-10-12 DIAGNOSIS — R062 Wheezing: Secondary | ICD-10-CM

## 2024-10-12 DIAGNOSIS — J069 Acute upper respiratory infection, unspecified: Secondary | ICD-10-CM

## 2024-10-12 DIAGNOSIS — R0602 Shortness of breath: Secondary | ICD-10-CM | POA: Diagnosis not present

## 2024-10-12 DIAGNOSIS — J209 Acute bronchitis, unspecified: Secondary | ICD-10-CM

## 2024-10-12 LAB — VERITOR SARS-COV-2 AND FLU A+B
BD Veritor SARS-CoV-2 Ag: NEGATIVE
Influenza A: NEGATIVE
Influenza B: NEGATIVE

## 2024-10-12 MED ORDER — METHYLPREDNISOLONE ACETATE 80 MG/ML IJ SUSP
80.0000 mg | Freq: Once | INTRAMUSCULAR | Status: AC
  Administered 2024-10-12: 80 mg via INTRAMUSCULAR

## 2024-10-12 MED ORDER — DOXYCYCLINE HYCLATE 100 MG PO TABS: 100.0000 mg | ORAL_TABLET | Freq: Two times a day (BID) | ORAL | 0 refills | Status: AC

## 2024-10-12 MED ORDER — AIRSUPRA 90-80 MCG/ACT IN AERO: 2.0000 | INHALATION_SPRAY | RESPIRATORY_TRACT | 3 refills | Status: AC | PRN

## 2024-10-12 MED ORDER — PROMETHAZINE-DM 6.25-15 MG/5ML PO SYRP
5.0000 mL | ORAL_SOLUTION | Freq: Three times a day (TID) | ORAL | 0 refills | Status: DC | PRN
Start: 2024-10-12 — End: 2024-10-17

## 2024-10-12 NOTE — Progress Notes (Signed)
 Acute Office Visit  Subjective:     Patient ID: [name], male    DOB: 03-May-1961, 63 y.o.   MRN: [id number]  Chief Complaint  Patient presents with   Fever    Fever     History of Present Illness   [name] is a 63 year old male with recurrent bronchitis who presents with a persistent cough and fever.  Cough and respiratory symptoms - Persistent dry cough for three weeks, onset October 21st - Cough exacerbated by talking and physical activity - Cough worse at night - Chest tightness, shortness of breath, and occasional wheezing, particularly since November 5th - Nasal congestion without discharge - Sinus pressure present - No sore throat or ear pain - No swelling in feet or ankles  Fever and constitutional symptoms - Fever of 101.47F on November 7th - Feeling cold today and yesterday - Headache when coughing - No recent exposure to sick contacts  Gastrointestinal symptoms - Diarrhea present  Prior treatments and response - Initial treatment with prednisone , antihistamines, Flonase , and Tessalon  Perles on October 24th with minimal improvement - Prescribed Augmentin and additional prednisone  after urgent care visit about 1 week later, with no significant improvement - Prescribed Z-Pak after e-visit on November 6th, unaware so was not started - Albuterol  inhaler used every 2-3 hours with some relief for the last week - Liquid cough medicine nearly depleted - Prednisone  provided minimal improvement - Use of '140 proof rye' for symptom relief  Relevant exposures and risk factors - No tobacco use - 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   As  per HPI.      Objective:    BP 103/66   Pulse 93   Temp (!) 101.7 F (38.7 C) (Temporal)   Ht 5' 5 (1.651 m)   Wt 186 lb 12.8 oz (84.7 kg)   SpO2 94%   BMI 31.09 kg/m    Physical Exam Vitals and nursing note reviewed.  Constitutional:      General: He is not in acute distress.    Appearance: He is ill-appearing. He is not toxic-appearing or diaphoretic.  HENT:     Right Ear: Tympanic membrane, ear canal and external ear normal.     Left Ear: Tympanic membrane, ear canal and external ear normal.     Nose: Congestion present.     Right Sinus: No maxillary sinus tenderness or frontal sinus tenderness.     Left Sinus: No maxillary sinus tenderness or frontal sinus tenderness.     Mouth/Throat:     Mouth: Mucous membranes are moist.     Pharynx: Oropharynx is clear. No oropharyngeal exudate or posterior oropharyngeal erythema.  Eyes:     General:        Right eye: No discharge.        Left eye: No discharge.     Conjunctiva/sclera: Conjunctivae normal.  Cardiovascular:     Rate and Rhythm: Normal rate and regular rhythm.     Heart sounds: Normal heart sounds. No murmur heard. Pulmonary:     Effort: Pulmonary effort is normal. No respiratory distress.     Breath sounds: Normal breath sounds.  No wheezing, rhonchi or rales.  Musculoskeletal:     Cervical back: Neck supple.     Right lower leg: No edema.     Left lower leg: No edema.  Skin:    General: Skin is warm and dry.  Neurological:     General: No focal deficit present.     Mental Status: He is alert and oriented to person, place, and time.  Psychiatric:        Mood and Affect: Mood normal.        Behavior: Behavior normal.     No results found for any visits on 10/12/24.      Assessment & Plan:   Harim was seen today for fever.  Diagnoses and all orders for this visit:  Fever in adult -     DG Chest 2 View; Future -     Veritor SARS-CoV-2 and Flu A+B -     doxycycline  (VIBRA -TABS) 100 MG tablet;  Take 1 tablet (100 mg total) by mouth 2 (two) times daily for 7 days. 1 po bid  URI, acute -     doxycycline  (VIBRA -TABS) 100 MG tablet; Take 1 tablet (100 mg total) by mouth 2 (two) times daily for 7 days. 1 po bid  Wheezing -     Albuterol -Budesonide (AIRSUPRA) 90-80 MCG/ACT AERO; Inhale 2 puffs into the lungs as needed (wheezing, shortness of breath). Max of 12 puffs per 24 hours -     methylPREDNISolone  acetate (DEPO-MEDROL ) injection 80 mg -     Ambulatory referral to Pulmonology  Acute bronchitis, unspecified organism -     methylPREDNISolone  acetate (DEPO-MEDROL ) injection 80 mg -     promethazine -dextromethorphan (PROMETHAZINE -DM) 6.25-15 MG/5ML syrup; Take 5 mLs by mouth 3 (three) times daily as needed for cough.  History of bronchitis -     Ambulatory referral to Pulmonology   Assessment and Plan    Acute bronchitis with persistent nonproductive cough, and wheezing with new onset fever Persistent cough and wheezing despite previous treatments. Normal chest CT on 10/06/24. No pneumonia on xray today, agree with radiology read. Negative COVID, flu, and RSV tests on 10/06/24 and today. Differential includes bacterial infection. No COPD or asthma diagnosis, but frequent bronchitis episodes in past.  - Prescribed doxycycline  twice daily for one week. - Administered steroid injection to reduce inflammation. - Prescribed Air Supra inhaler as needed for wheezing, shortness of breath, or chest tightness, up to 12 puffs in 24 hours. - Provided work note for absence until Monday. - Advised follow-up if symptoms persist beyond Monday.  Suspected asthma Symptoms suggestive of asthma with frequent bronchitis episodes. No prior asthma or COPD diagnosis. - Referred to pulmonologist in [place] for asthma evaluation and testing. - Advised keeping pulmonology appointment even if symptoms improve.      Return to office for new or worsening symptoms, or if symptoms persist.   The patient  indicates understanding of these issues and agrees with the plan.  Annabella CHRISTELLA Search, FNP

## 2024-10-15 ENCOUNTER — Encounter: Payer: Self-pay | Admitting: Family

## 2024-10-15 ENCOUNTER — Ambulatory Visit: Admitting: Family

## 2024-10-15 ENCOUNTER — Ambulatory Visit: Payer: Self-pay

## 2024-10-15 ENCOUNTER — Telehealth: Payer: Self-pay | Admitting: Family Medicine

## 2024-10-15 VITALS — BP 107/69 | HR 109 | Temp 98.5°F | Ht 65.0 in | Wt 178.0 lb

## 2024-10-15 DIAGNOSIS — A084 Viral intestinal infection, unspecified: Secondary | ICD-10-CM

## 2024-10-15 MED ORDER — ONDANSETRON HCL 4 MG PO TABS: 4.0000 mg | ORAL_TABLET | Freq: Three times a day (TID) | ORAL | 0 refills | Status: DC | PRN

## 2024-10-15 NOTE — Telephone Encounter (Signed)
 Copied from CRM [id number]. Topic: General - Other >> Oct 15, 2024  4:12 PM Nathanel BROCKS wrote: Reason for CRM: pt called back he was there earlier today and he needs a note for work for today and tomorrow. Please call pt and advise.

## 2024-10-15 NOTE — Patient Instructions (Signed)

## 2024-10-15 NOTE — Progress Notes (Signed)
 Subjective:    Patient ID: [name], male    DOB: Mar 26, 1961, 63 y.o.   MRN: [id number]  Chief Complaint  Patient presents with   Fatigue   Emesis   PT presents to the office today with emesis that started this AM.   He was seen on 10/12/24 and diagnosed with bronchitis. Given steroid shot and doxycyline. Reports his cough has improved.   Emesis  This is a new problem. The current episode started today. The problem occurs 2 to 4 times per day. The problem has been unchanged. The emesis has an appearance of stomach contents. There has been no fever. Associated symptoms include diarrhea and myalgias. Pertinent negatives include no arthralgias, chest pain, chills, coughing, dizziness, fever, sweats or URI.      Review of Systems  Constitutional:  Negative for chills and fever.  Respiratory:  Negative for cough.   Cardiovascular:  Negative for chest pain.  Gastrointestinal:  Positive for diarrhea and vomiting.  Musculoskeletal:  Positive for myalgias. Negative for arthralgias.  Neurological:  Negative for dizziness.  All other systems reviewed and are negative.   Social History   Socioeconomic History   Marital status: Married    Spouse name: Not on file   Number of children: Not on file   Years of education: Not on file   Highest education level: 9th grade  Occupational History   Not on file  Tobacco Use   Smoking status: Never   Smokeless tobacco: Never  Vaping Use   Vaping status: Never Used  Substance and Sexual Activity   Alcohol use: Yes    Alcohol/week: 4.0 standard drinks of alcohol    Types: 4 Cans of beer per week    Comment: occ   Drug use: No   Sexual activity: Yes    Birth control/protection: None    Comment: married 2005  Other Topics Concern   Not on file  Social History Narrative   Not on file   Social Drivers of Health   Financial Resource Strain: Low Risk  (09/27/2024)   Overall Financial Resource Strain (CARDIA)    Difficulty of  Paying Living Expenses: Not hard at all  Food Insecurity: No Food Insecurity (09/27/2024)   Hunger Vital Sign    Worried About Running Out of Food in the Last Year: Never true    Ran Out of Food in the Last Year: Never true  Transportation Needs: No Transportation Needs (09/27/2024)   PRAPARE - Administrator, Civil Service (Medical): No    Lack of Transportation (Non-Medical): No  Physical Activity: Insufficiently Active (09/27/2024)   Exercise Vital Sign    Days of Exercise per Week: 3 days    Minutes of Exercise per Session: 30 min  Stress: No Stress Concern Present (09/27/2024)   [employer] of Occupational Health - Occupational Stress Questionnaire    Feeling of Stress: Not at all  Social Connections: Moderately Isolated (09/27/2024)   Social Connection and Isolation Panel    Frequency of Communication with Friends and Family: More than three times a week    Frequency of Social Gatherings with Friends and Family: Twice a week    Attends Religious Services: Never    Database Administrator or Organizations: No    Attends Engineer, Structural: Not on file    Marital Status: Married   Family History  Problem Relation Age of Onset   Stroke Mother    Cancer Mother  colon   Alzheimer's disease Father        72 years   Heart disease Father        atrial fibri   Heart disease Sister    Early death Brother    Cancer Maternal Grandmother         Objective:   Physical Exam Vitals reviewed.  Constitutional:      General: He is not in acute distress.    Appearance: He is well-developed.  HENT:     Head: Normocephalic.     Right Ear: Tympanic membrane normal.     Left Ear: Tympanic membrane normal.  Eyes:     General:        Right eye: No discharge.        Left eye: No discharge.     Pupils: Pupils are equal, round, and reactive to light.  Neck:     Thyroid : No thyromegaly.  Cardiovascular:     Rate and Rhythm: Normal rate and regular  rhythm.     Heart sounds: Normal heart sounds. No murmur heard. Pulmonary:     Effort: Pulmonary effort is normal. No respiratory distress.     Breath sounds: Normal breath sounds. No wheezing.  Abdominal:     General: Bowel sounds are normal. There is no distension.     Palpations: Abdomen is soft.     Tenderness: There is no abdominal tenderness.  Musculoskeletal:        General: No tenderness. Normal range of motion.     Cervical back: Normal range of motion and neck supple.  Skin:    General: Skin is warm and dry.     Findings: No erythema or rash.  Neurological:     Mental Status: He is alert and oriented to person, place, and time.     Cranial Nerves: No cranial nerve deficit.     Deep Tendon Reflexes: Reflexes are normal and symmetric.  Psychiatric:        Behavior: Behavior normal.        Thought Content: Thought content normal.        Judgment: Judgment normal.       BP 107/69   Pulse (!) 109   Temp 98.5 F (36.9 C) (Temporal)   Ht 5' 5 (1.651 m)   Wt 178 lb (80.7 kg)   SpO2 95%   BMI 29.62 kg/m      Assessment & Plan:  [name] comes in today with chief complaint of Fatigue and Emesis   Diagnosis and orders addressed:  1. Viral gastroenteritis (Primary) Rest Force fluids Zofran  as needed Bland diet  Follow up if symptoms worsen or do not improve  - ondansetron  (ZOFRAN ) 4 MG tablet; Take 1 tablet (4 mg total) by mouth every 8 (eight) hours as needed for nausea or vomiting.  Dispense: 20 tablet; Refill: 0    Bari Learn, FNP

## 2024-10-15 NOTE — Telephone Encounter (Addendum)
  Cough is gone Still feeling fatigued/generalized malaise Patient only has five tablets left of doxycycline  No energy Patient states he has been hydrating some and just not having a big appetite   FYI Only or Action Required?: FYI only for provider: appointment scheduled on 10/15/2024 at 3:05pm with Bari Learn FNP.  Patient was last seen in primary care on 10/12/2024 by Joesph Annabella HERO, FNP.  Called Nurse Triage reporting Fatigue.  Symptoms began a week ago.  Interventions attempted: Prescription medications: Antibiotic and Rest, hydration, or home remedies.  Symptoms are: cough better but fatigue/generalized malaise worse per patient.  Triage Disposition: See HCP Within 4 Hours (Or PCP Triage)  Patient/caregiver understands and will follow disposition?: Yes                  Copied from CRM [id number]. Topic: Clinical - Red Word Triage >> Oct 15, 2024 10:42 AM Delon HERO wrote: Red Word that prompted transfer to Nurse Triage: Patient is calling to report weakness & fatigue, no appetite  Seen 10/12/2024 Requesting to be written out of work until Wednesday. Originally the doctor's note was written until today. doxycycline  (VIBRA -TABS) 100 MG tablet; Take 1 tablet (100 mg total) by mouth 2 (two) times daily for 7 days. 1 po bid With 2 d left. Reason for Disposition  [1] MODERATE weakness (e.g., interferes with work, school, normal activities) AND [2] cause unknown  (Exceptions: Weakness from acute minor illness or poor fluid intake; weakness is chronic and not worse.)  Protocols used: Weakness (Generalized) and Fatigue-A-AH

## 2024-10-15 NOTE — Telephone Encounter (Signed)
 Note printed patient aware.

## 2024-10-15 NOTE — Telephone Encounter (Signed)
 Answer Assessment - Initial Assessment Questions Cough is gone Still feeling fatigued/generalized malaise Patient only has five tablets left of doxycycline  No energy Patient states he has been hydrating some and just not having a big appetite Patient states he feels generally worse overall with the fatigue/generalized malaise   Patient is advised to call us  back if anything changes or with any further questions/concerns. Patient is advised that if anything worsens to go to the Emergency Room. Patient verbalized understanding.  Protocols used: Weakness (Generalized) and Fatigue-A-AH

## 2024-10-15 NOTE — Telephone Encounter (Signed)
 Appt made.

## 2024-10-17 ENCOUNTER — Other Ambulatory Visit: Payer: Self-pay

## 2024-10-17 ENCOUNTER — Emergency Department (HOSPITAL_COMMUNITY)

## 2024-10-17 ENCOUNTER — Inpatient Hospital Stay (HOSPITAL_COMMUNITY)

## 2024-10-17 ENCOUNTER — Inpatient Hospital Stay (HOSPITAL_COMMUNITY)
Admission: EM | Admit: 2024-10-17 | Discharge: 2024-10-30 | DRG: 871 | Disposition: A | Discharge status: Rehabilitation Facility | Attending: Internal Medicine | Admitting: Internal Medicine

## 2024-10-17 ENCOUNTER — Encounter (HOSPITAL_COMMUNITY): Payer: Self-pay

## 2024-10-17 DIAGNOSIS — B004 Herpesviral encephalitis: Secondary | ICD-10-CM | POA: Diagnosis present

## 2024-10-17 DIAGNOSIS — G9341 Metabolic encephalopathy: Secondary | ICD-10-CM | POA: Diagnosis present

## 2024-10-17 DIAGNOSIS — E871 Hypo-osmolality and hyponatremia: Secondary | ICD-10-CM | POA: Diagnosis present

## 2024-10-17 DIAGNOSIS — G9340 Encephalopathy, unspecified: Secondary | ICD-10-CM | POA: Diagnosis present

## 2024-10-17 DIAGNOSIS — F419 Anxiety disorder, unspecified: Secondary | ICD-10-CM | POA: Diagnosis present

## 2024-10-17 DIAGNOSIS — Z79899 Other long term (current) drug therapy: Secondary | ICD-10-CM | POA: Diagnosis not present

## 2024-10-17 DIAGNOSIS — K219 Gastro-esophageal reflux disease without esophagitis: Secondary | ICD-10-CM | POA: Diagnosis present

## 2024-10-17 DIAGNOSIS — A86 Unspecified viral encephalitis: Secondary | ICD-10-CM | POA: Diagnosis not present

## 2024-10-17 DIAGNOSIS — R6520 Severe sepsis without septic shock: Secondary | ICD-10-CM | POA: Diagnosis not present

## 2024-10-17 DIAGNOSIS — K5900 Constipation, unspecified: Secondary | ICD-10-CM | POA: Diagnosis not present

## 2024-10-17 DIAGNOSIS — Z91030 Bee allergy status: Secondary | ICD-10-CM | POA: Diagnosis not present

## 2024-10-17 DIAGNOSIS — R6521 Severe sepsis with septic shock: Secondary | ICD-10-CM | POA: Diagnosis present

## 2024-10-17 DIAGNOSIS — E8729 Other acidosis: Secondary | ICD-10-CM | POA: Diagnosis not present

## 2024-10-17 DIAGNOSIS — E872 Acidosis, unspecified: Secondary | ICD-10-CM | POA: Diagnosis present

## 2024-10-17 DIAGNOSIS — G009 Bacterial meningitis, unspecified: Secondary | ICD-10-CM

## 2024-10-17 DIAGNOSIS — B007 Disseminated herpesviral disease: Secondary | ICD-10-CM | POA: Diagnosis present

## 2024-10-17 DIAGNOSIS — A879 Viral meningitis, unspecified: Secondary | ICD-10-CM | POA: Diagnosis not present

## 2024-10-17 DIAGNOSIS — G936 Cerebral edema: Secondary | ICD-10-CM | POA: Diagnosis present

## 2024-10-17 DIAGNOSIS — R6510 Systemic inflammatory response syndrome (SIRS) of non-infectious origin without acute organ dysfunction: Secondary | ICD-10-CM | POA: Diagnosis present

## 2024-10-17 DIAGNOSIS — E1165 Type 2 diabetes mellitus with hyperglycemia: Secondary | ICD-10-CM | POA: Diagnosis not present

## 2024-10-17 DIAGNOSIS — B020 Zoster encephalitis: Secondary | ICD-10-CM | POA: Diagnosis not present

## 2024-10-17 DIAGNOSIS — Z7401 Bed confinement status: Secondary | ICD-10-CM | POA: Diagnosis not present

## 2024-10-17 DIAGNOSIS — R269 Unspecified abnormalities of gait and mobility: Secondary | ICD-10-CM | POA: Diagnosis not present

## 2024-10-17 DIAGNOSIS — Z7982 Long term (current) use of aspirin: Secondary | ICD-10-CM | POA: Diagnosis not present

## 2024-10-17 DIAGNOSIS — J9601 Acute respiratory failure with hypoxia: Secondary | ICD-10-CM

## 2024-10-17 DIAGNOSIS — Z885 Allergy status to narcotic agent status: Secondary | ICD-10-CM | POA: Diagnosis not present

## 2024-10-17 DIAGNOSIS — B1009 Other human herpesvirus encephalitis: Secondary | ICD-10-CM | POA: Diagnosis not present

## 2024-10-17 DIAGNOSIS — E861 Hypovolemia: Secondary | ICD-10-CM | POA: Diagnosis present

## 2024-10-17 DIAGNOSIS — R339 Retention of urine, unspecified: Secondary | ICD-10-CM

## 2024-10-17 DIAGNOSIS — F32A Depression, unspecified: Secondary | ICD-10-CM | POA: Diagnosis present

## 2024-10-17 DIAGNOSIS — L8990 Pressure ulcer of unspecified site, unspecified stage: Secondary | ICD-10-CM | POA: Insufficient documentation

## 2024-10-17 DIAGNOSIS — K5901 Slow transit constipation: Secondary | ICD-10-CM | POA: Diagnosis not present

## 2024-10-17 DIAGNOSIS — R7989 Other specified abnormal findings of blood chemistry: Secondary | ICD-10-CM | POA: Diagnosis not present

## 2024-10-17 DIAGNOSIS — Z981 Arthrodesis status: Secondary | ICD-10-CM | POA: Diagnosis not present

## 2024-10-17 DIAGNOSIS — R4182 Altered mental status, unspecified: Secondary | ICD-10-CM | POA: Diagnosis not present

## 2024-10-17 DIAGNOSIS — R059 Cough, unspecified: Secondary | ICD-10-CM | POA: Diagnosis not present

## 2024-10-17 DIAGNOSIS — R569 Unspecified convulsions: Secondary | ICD-10-CM

## 2024-10-17 DIAGNOSIS — A419 Sepsis, unspecified organism: Secondary | ICD-10-CM | POA: Diagnosis present

## 2024-10-17 DIAGNOSIS — R498 Other voice and resonance disorders: Secondary | ICD-10-CM | POA: Diagnosis not present

## 2024-10-17 DIAGNOSIS — R739 Hyperglycemia, unspecified: Secondary | ICD-10-CM | POA: Diagnosis not present

## 2024-10-17 DIAGNOSIS — Z9911 Dependence on respirator [ventilator] status: Secondary | ICD-10-CM

## 2024-10-17 DIAGNOSIS — Z85828 Personal history of other malignant neoplasm of skin: Secondary | ICD-10-CM | POA: Diagnosis not present

## 2024-10-17 DIAGNOSIS — R531 Weakness: Secondary | ICD-10-CM | POA: Diagnosis not present

## 2024-10-17 DIAGNOSIS — N179 Acute kidney failure, unspecified: Secondary | ICD-10-CM | POA: Diagnosis present

## 2024-10-17 DIAGNOSIS — Z8249 Family history of ischemic heart disease and other diseases of the circulatory system: Secondary | ICD-10-CM | POA: Diagnosis not present

## 2024-10-17 DIAGNOSIS — R630 Anorexia: Secondary | ICD-10-CM | POA: Diagnosis not present

## 2024-10-17 DIAGNOSIS — Z1152 Encounter for screening for COVID-19: Secondary | ICD-10-CM | POA: Diagnosis not present

## 2024-10-17 DIAGNOSIS — R5381 Other malaise: Secondary | ICD-10-CM | POA: Diagnosis not present

## 2024-10-17 DIAGNOSIS — R509 Fever, unspecified: Secondary | ICD-10-CM | POA: Diagnosis not present

## 2024-10-17 HISTORY — DX: Idiopathic aseptic necrosis of unspecified bone: M87.00

## 2024-10-17 HISTORY — DX: Spondylosis without myelopathy or radiculopathy, lumbar region: M47.816

## 2024-10-17 LAB — URINALYSIS, W/ REFLEX TO CULTURE (INFECTION SUSPECTED)
Bilirubin Urine: NEGATIVE
Glucose, UA: NEGATIVE mg/dL
Ketones, ur: NEGATIVE mg/dL
Leukocytes,Ua: NEGATIVE
Nitrite: NEGATIVE
Protein, ur: 100 mg/dL — AB
Specific Gravity, Urine: >1.046 — ABNORMAL HIGH (ref 1.005–1.030)
pH: 5 (ref 5.0–8.0)

## 2024-10-17 LAB — BLOOD GAS, VENOUS
Acid-base deficit: 1.5 mmol/L (ref 0.0–2.0)
Bicarbonate: 24.8 mmol/L (ref 20.0–28.0)
Drawn by: 73776
O2 Saturation: 90.4 %
Patient temperature: 36.6
pCO2, Ven: 46 mmHg (ref 44–60)
pH, Ven: 7.34 (ref 7.25–7.43)
pO2, Ven: 58 mmHg — ABNORMAL HIGH (ref 32–45)

## 2024-10-17 LAB — CBC WITH DIFFERENTIAL/PLATELET
Abs Immature Granulocytes: 0.04 K/uL (ref 0.00–0.07)
Basophils Absolute: 0 K/uL (ref 0.0–0.1)
Basophils Relative: 0 %
Eosinophils Absolute: 0 K/uL (ref 0.0–0.5)
Eosinophils Relative: 0 %
HCT: 40.4 % (ref 39.0–52.0)
Hemoglobin: 15 g/dL (ref 13.0–17.0)
Immature Granulocytes: 0 %
Lymphocytes Relative: 5 %
Lymphs Abs: 0.7 K/uL (ref 0.7–4.0)
MCH: 33.4 pg (ref 26.0–34.0)
MCHC: 37.1 g/dL — ABNORMAL HIGH (ref 30.0–36.0)
MCV: 90 fL (ref 80.0–100.0)
Monocytes Absolute: 0.9 K/uL (ref 0.1–1.0)
Monocytes Relative: 7 %
Neutro Abs: 11.5 K/uL — ABNORMAL HIGH (ref 1.7–7.7)
Neutrophils Relative %: 88 %
Platelets: 215 K/uL (ref 150–400)
RBC: 4.49 MIL/uL (ref 4.22–5.81)
RDW: 13 % (ref 11.5–15.5)
WBC: 13.1 K/uL — ABNORMAL HIGH (ref 4.0–10.5)
nRBC: 0 % (ref 0.0–0.2)

## 2024-10-17 LAB — COMPREHENSIVE METABOLIC PANEL WITH GFR
ALT: 37 U/L (ref 0–44)
AST: 38 U/L (ref 15–41)
Albumin: 4.4 g/dL (ref 3.5–5.0)
Alkaline Phosphatase: 43 U/L (ref 38–126)
Anion gap: 16 — ABNORMAL HIGH (ref 5–15)
BUN: 25 mg/dL — ABNORMAL HIGH (ref 8–23)
CO2: 20 mmol/L — ABNORMAL LOW (ref 22–32)
Calcium: 8.7 mg/dL — ABNORMAL LOW (ref 8.9–10.3)
Chloride: 95 mmol/L — ABNORMAL LOW (ref 98–111)
Creatinine, Ser: 1.18 mg/dL (ref 0.61–1.24)
GFR, Estimated: >60 mL/min (ref >60)
Glucose, Bld: 106 mg/dL — ABNORMAL HIGH (ref 70–99)
Potassium: 3.7 mmol/L (ref 3.5–5.1)
Sodium: 130 mmol/L — ABNORMAL LOW (ref 135–145)
Total Bilirubin: 1.2 mg/dL (ref 0.0–1.2)
Total Protein: 7 g/dL (ref 6.5–8.1)

## 2024-10-17 LAB — RESP PANEL BY RT-PCR (RSV, FLU A&B, COVID)  RVPGX2
Influenza A by PCR: NEGATIVE
Influenza B by PCR: NEGATIVE
Resp Syncytial Virus by PCR: NEGATIVE
SARS Coronavirus 2 by RT PCR: NEGATIVE

## 2024-10-17 LAB — CBG MONITORING, ED: Glucose-Capillary: 125 mg/dL — ABNORMAL HIGH (ref 70–99)

## 2024-10-17 LAB — PROTIME-INR
INR: 1.1 (ref 0.8–1.2)
Prothrombin Time: 15 s (ref 11.4–15.2)

## 2024-10-17 LAB — LACTIC ACID, PLASMA
Lactic Acid, Venous: 3.4 mmol/L (ref 0.5–1.9)
Lactic Acid, Venous: 2.9 mmol/L (ref 0.5–1.9)

## 2024-10-17 MED ORDER — SODIUM CHLORIDE 0.9 % IV SOLN
500.0000 mg | Freq: Once | INTRAVENOUS | Status: AC
  Administered 2024-10-17: 500 mg via INTRAVENOUS
  Filled 2024-10-17: qty 5

## 2024-10-17 MED ORDER — POLYETHYLENE GLYCOL 3350 17 G PO PACK: 17.0000 g | PACK | Freq: Every day | ORAL | Status: DC | PRN

## 2024-10-17 MED ORDER — ACETAMINOPHEN 500 MG PO TABS
1000.0000 mg | ORAL_TABLET | Freq: Once | ORAL | Status: AC
  Administered 2024-10-17: 1000 mg via ORAL
  Filled 2024-10-17: qty 2

## 2024-10-17 MED ORDER — LACTATED RINGERS IV SOLN: INTRAVENOUS | Status: DC

## 2024-10-17 MED ORDER — VANCOMYCIN HCL IN DEXTROSE 1-5 GM/200ML-% IV SOLN
1000.0000 mg | Freq: Once | INTRAVENOUS | Status: AC
  Administered 2024-10-17: 1000 mg via INTRAVENOUS
  Filled 2024-10-17: qty 200

## 2024-10-17 MED ORDER — LACTATED RINGERS IV BOLUS (SEPSIS)
1000.0000 mL | Freq: Once | INTRAVENOUS | Status: AC
  Administered 2024-10-17: 1000 mL via INTRAVENOUS

## 2024-10-17 MED ORDER — LACTATED RINGERS IV BOLUS (SEPSIS)
500.0000 mL | Freq: Once | INTRAVENOUS | Status: AC
  Administered 2024-10-17: 500 mL via INTRAVENOUS

## 2024-10-17 MED ORDER — ACETAMINOPHEN 650 MG RE SUPP
650.0000 mg | Freq: Four times a day (QID) | RECTAL | Status: DC | PRN
Start: 2024-10-17 — End: 2024-10-20
  Administered 2024-10-19: 650 mg via RECTAL
  Filled 2024-10-17: qty 1

## 2024-10-17 MED ORDER — SODIUM CHLORIDE 0.9 % IV SOLN
2.0000 g | Freq: Once | INTRAVENOUS | Status: AC
  Administered 2024-10-17: 2 g via INTRAVENOUS
  Filled 2024-10-17: qty 20

## 2024-10-17 MED ORDER — SODIUM CHLORIDE 0.9 % IV SOLN
1.0000 g | Freq: Four times a day (QID) | INTRAVENOUS | Status: DC
  Filled 2024-10-17 (×3): qty 1000

## 2024-10-17 MED ORDER — DEXAMETHASONE SOD PHOSPHATE PF 10 MG/ML IJ SOLN
10.0000 mg | Freq: Four times a day (QID) | INTRAMUSCULAR | Status: DC
  Administered 2024-10-17 – 2024-10-18 (×3): 10 mg via INTRAVENOUS

## 2024-10-17 MED ORDER — SODIUM CHLORIDE 0.9 % IV SOLN
2.0000 g | INTRAVENOUS | Status: DC
  Administered 2024-10-17 – 2024-10-18 (×5): 2 g via INTRAVENOUS
  Filled 2024-10-17 (×13): qty 2000

## 2024-10-17 MED ORDER — SODIUM CHLORIDE 0.9 % IV SOLN: INTRAVENOUS | Status: AC

## 2024-10-17 MED ORDER — ONDANSETRON HCL 4 MG/2ML IJ SOLN: 4.0000 mg | Freq: Four times a day (QID) | INTRAMUSCULAR | Status: DC | PRN

## 2024-10-17 MED ORDER — DEXTROSE 5 % IV SOLN
800.0000 mg | Freq: Three times a day (TID) | INTRAVENOUS | Status: DC
  Administered 2024-10-17 – 2024-10-30 (×39): 800 mg via INTRAVENOUS
  Filled 2024-10-17 (×44): qty 16

## 2024-10-17 MED ORDER — IPRATROPIUM-ALBUTEROL 0.5-2.5 (3) MG/3ML IN SOLN
3.0000 mL | Freq: Once | RESPIRATORY_TRACT | Status: AC
  Administered 2024-10-17: 3 mL via RESPIRATORY_TRACT
  Filled 2024-10-17: qty 3

## 2024-10-17 MED ORDER — VANCOMYCIN HCL IN DEXTROSE 1-5 GM/200ML-% IV SOLN
1000.0000 mg | Freq: Once | INTRAVENOUS | Status: AC
  Administered 2024-10-18: 1000 mg via INTRAVENOUS
  Filled 2024-10-17: qty 200

## 2024-10-17 MED ORDER — VANCOMYCIN HCL 750 MG/150ML IV SOLN
750.0000 mg | Freq: Two times a day (BID) | INTRAVENOUS | Status: DC
  Administered 2024-10-18: 750 mg via INTRAVENOUS
  Filled 2024-10-17 (×3): qty 150

## 2024-10-17 MED ORDER — CHLORHEXIDINE GLUCONATE CLOTH 2 % EX PADS
6.0000 | MEDICATED_PAD | Freq: Every day | CUTANEOUS | Status: DC
  Administered 2024-10-17 – 2024-10-30 (×14): 6 via TOPICAL

## 2024-10-17 MED ORDER — KETOROLAC TROMETHAMINE 15 MG/ML IJ SOLN
15.0000 mg | Freq: Once | INTRAMUSCULAR | Status: AC
  Administered 2024-10-17: 15 mg via INTRAVENOUS
  Filled 2024-10-17: qty 1

## 2024-10-17 MED ORDER — ONDANSETRON HCL 4 MG PO TABS: 4.0000 mg | ORAL_TABLET | Freq: Four times a day (QID) | ORAL | Status: DC | PRN

## 2024-10-17 MED ORDER — KCL IN DEXTROSE-NACL 20-5-0.9 MEQ/L-%-% IV SOLN: INTRAVENOUS | Status: DC

## 2024-10-17 MED ORDER — IOHEXOL 350 MG/ML SOLN
75.0000 mL | Freq: Once | INTRAVENOUS | Status: AC | PRN
  Administered 2024-10-17: 75 mL via INTRAVENOUS

## 2024-10-17 MED ORDER — SODIUM CHLORIDE 0.9 % IV SOLN
2.0000 g | Freq: Two times a day (BID) | INTRAVENOUS | Status: DC
  Administered 2024-10-18 (×2): 2 g via INTRAVENOUS
  Filled 2024-10-17 (×2): qty 20

## 2024-10-17 MED ORDER — ACETAMINOPHEN 325 MG PO TABS
650.0000 mg | ORAL_TABLET | Freq: Four times a day (QID) | ORAL | Status: DC | PRN
  Administered 2024-10-19: 650 mg via ORAL
  Filled 2024-10-17 (×2): qty 2

## 2024-10-17 NOTE — ED Triage Notes (Signed)
 Pt BIB [facility] for ongoing weakness. Pt fell this morning w/o LOC but was too weak to get up. Started doxycycline  on 11/10. Experiencing SHOB on 4L

## 2024-10-17 NOTE — ED Provider Notes (Addendum)
  EMERGENCY DEPARTMENT AT [facility] Provider Note   CSN: [id number] Arrival date & time: 10/17/24  1241     Patient presents with: Weakness   [name] is a 63 y.o. male.   Patient is a 63 year old male who presents emergency department the chief complaint of ongoing productive cough, generalized weakness, fever.  He was evaluated in the emergency department within the past week and placed on doxycycline  which he has been compliant with.  Patient notes that he has had no associated dizziness or lightheadedness but notes that he had worsening weakness this morning which did cause him to fall and he was unable to get up.  Patient denies any injuries associated with the fall including pain to long bones or joints, neck or back.  He did not strike his head during the fall but does note he is having a headache at this time.  Patient was noted to be hypoxic with EMS and is currently on 3 L nasal cannula and maintaining his oxygen  saturations.  He denies any active chest pain, abdominal pain.  He has had some diarrhea over the past 2 days.   Weakness Associated symptoms: cough and shortness of breath        Prior to Admission medications   Medication Sig Start Date End Date Taking? Authorizing Provider  albuterol  (VENTOLIN  HFA) 108 (90 Base) MCG/ACT inhaler USE 2 PUFFS EVERY 6 HOURS AS NEEDED 09/30/23   Dettinger, Fonda LABOR, MD  Albuterol -Budesonide (AIRSUPRA) 90-80 MCG/ACT AERO Inhale 2 puffs into the lungs as needed (wheezing, shortness of breath). Max of 12 puffs per 24 hours 10/12/24   Joesph Annabella HERO, FNP  aspirin  81 MG chewable tablet Chew 1 tablet (81 mg total) by mouth daily. 09/08/23   Mesner, Selinda, MD  cetirizine  (ZYRTEC  ALLERGY) 10 MG tablet Take 1 tablet (10 mg total) by mouth daily. 09/28/24   Lavell Bari LABOR, FNP  doxycycline  (VIBRA -TABS) 100 MG tablet Take 1 tablet (100 mg total) by mouth 2 (two) times daily for 7 days. 1 po bid 10/12/24 10/19/24   Joesph Annabella HERO, FNP  famotidine  (PEPCID ) 20 MG tablet Take 1 tablet (20 mg total) by mouth 2 (two) times daily as needed for heartburn or indigestion (breakthrough not controlled by Protonix ). 06/13/24   Dettinger, Fonda LABOR, MD  fluticasone  (FLONASE ) 50 MCG/ACT nasal spray Place 2 sprays into both nostrils daily. 09/28/24   Lavell Bari LABOR, FNP  mirtazapine  (REMERON ) 7.5 MG tablet Take 1 tablet (7.5 mg total) by mouth at bedtime. For anxiety/ sleep 12/05/23   Dettinger, Fonda LABOR, MD  ondansetron  (ZOFRAN ) 4 MG tablet Take 1 tablet (4 mg total) by mouth every 8 (eight) hours as needed for nausea or vomiting. 10/15/24   Lavell Bari A, FNP  ondansetron  (ZOFRAN -ODT) 4 MG disintegrating tablet Take 1 tablet (4 mg total) by mouth every 8 (eight) hours as needed for nausea or vomiting. 12/13/23   Raford Lenis, MD  pantoprazole  (PROTONIX ) 40 MG tablet TAKE ONE (1) TABLET BY MOUTH EVERY DAY 09/20/24   Dettinger, Fonda LABOR, MD  potassium chloride  SA (KLOR-CON  M) 20 MEQ tablet Take 1 tablet (20 mEq total) by mouth 2 (two) times daily. 12/13/23   Raford Lenis, MD  promethazine -dextromethorphan (PROMETHAZINE -DM) 6.25-15 MG/5ML syrup Take 5 mLs by mouth 3 (three) times daily as needed for cough. 10/12/24   Joesph Annabella HERO, FNP  rosuvastatin  (CRESTOR ) 20 MG tablet Take 1 tablet (20 mg total) by mouth daily. 09/30/23   Dettinger,  Fonda LABOR, MD  sertraline  (ZOLOFT ) 50 MG tablet Take 1 tablet (50 mg total) by mouth daily. 12/05/23   Dettinger, Fonda LABOR, MD  sucralfate  (CARAFATE ) 1 g tablet Take 1 tablet (1 g total) by mouth 4 (four) times daily -  with meals and at bedtime. 12/13/23   Raford Lenis, MD    Allergies: Morphine and codeine and Bee venom    Review of Systems  Respiratory:  Positive for cough and shortness of breath.   Neurological:  Positive for weakness.  All other systems reviewed and are negative.   Updated Vital Signs BP 116/83   Pulse 97   Temp (!) 102.2 F (39 C) (Rectal)   Resp (!) 22   Wt  80.7 kg   SpO2 99%   BMI 29.62 kg/m   Physical Exam Vitals and nursing note reviewed.  Constitutional:      General: He is not in acute distress.    Appearance: Normal appearance. He is not ill-appearing.  HENT:     Head: Normocephalic and atraumatic.     Nose: Nose normal.     Mouth/Throat:     Mouth: Mucous membranes are moist.  Eyes:     Extraocular Movements: Extraocular movements intact.     Conjunctiva/sclera: Conjunctivae normal.     Pupils: Pupils are equal, round, and reactive to light.  Cardiovascular:     Rate and Rhythm: Normal rate and regular rhythm.     Pulses: Normal pulses.     Heart sounds: Normal heart sounds. No murmur heard.    No gallop.  Pulmonary:     Effort: Pulmonary effort is normal. No respiratory distress.     Breath sounds: Normal breath sounds. No stridor. No wheezing, rhonchi or rales.  Abdominal:     General: Abdomen is flat. Bowel sounds are normal. There is no distension.     Palpations: Abdomen is soft.     Tenderness: There is no abdominal tenderness. There is no guarding.  Musculoskeletal:        General: No swelling, tenderness, deformity or signs of injury. Normal range of motion.     Cervical back: Normal range of motion and neck supple. No rigidity or tenderness.  Skin:    General: Skin is warm and dry.     Findings: No bruising or rash.  Neurological:     General: No focal deficit present.     Mental Status: He is alert and oriented to person, place, and time. Mental status is at baseline.     Cranial Nerves: No cranial nerve deficit.     Sensory: No sensory deficit.     Coordination: Coordination normal.  Psychiatric:        Mood and Affect: Mood normal.        Behavior: Behavior normal.        Thought Content: Thought content normal.        Judgment: Judgment normal.     (all labs ordered are listed, but only abnormal results are displayed) Labs Reviewed  RESP PANEL BY RT-PCR (RSV, FLU A&B, COVID)  RVPGX2  CULTURE,  BLOOD (ROUTINE X 2)  CULTURE, BLOOD (ROUTINE X 2)  LACTIC ACID, PLASMA  LACTIC ACID, PLASMA  COMPREHENSIVE METABOLIC PANEL WITH GFR  CBC WITH DIFFERENTIAL/PLATELET  PROTIME-INR  URINALYSIS, W/ REFLEX TO CULTURE (INFECTION SUSPECTED)    EKG: None  Radiology: No results found.   .Critical Care  Performed by: Daralene Lonni BIRCH, PA-C Authorized by: Daralene Lonni BIRCH, PA-C  Critical care provider statement:    Critical care time (minutes):  45   Critical care was necessary to treat or prevent imminent or life-threatening deterioration of the following conditions:  Sepsis   Critical care was time spent personally by me on the following activities:  Development of treatment plan with patient or surrogate, discussions with consultants, evaluation of patient's response to treatment, examination of patient, ordering and review of laboratory studies, ordering and review of radiographic studies, ordering and performing treatments and interventions, pulse oximetry, re-evaluation of patient's condition and review of old charts   I assumed direction of critical care for this patient from another provider in my specialty: no     Care discussed with: admitting provider      Medications Ordered in the ED  lactated ringers  infusion (has no administration in time range)  lactated ringers  bolus 1,000 mL (1,000 mLs Intravenous New Bag/Given 10/17/24 1333)    And  lactated ringers  bolus 1,000 mL (1,000 mLs Intravenous New Bag/Given 10/17/24 1336)    And  lactated ringers  bolus 500 mL (has no administration in time range)  cefTRIAXone  (ROCEPHIN ) 2 g in sodium chloride  0.9 % 100 mL IVPB (2 g Intravenous New Bag/Given 10/17/24 1323)  azithromycin  (ZITHROMAX ) 500 mg in sodium chloride  0.9 % 250 mL IVPB (500 mg Intravenous New Bag/Given 10/17/24 1331)  acetaminophen  (TYLENOL ) tablet 1,000 mg (1,000 mg Oral Given 10/17/24 1332)  ketorolac  (TORADOL ) 15 MG/ML injection 15 mg (15 mg Intravenous Given  10/17/24 1333)  ipratropium-albuterol  (DUONEB) 0.5-2.5 (3) MG/3ML nebulizer solution 3 mL (3 mLs Nebulization Given 10/17/24 1333)                                    Medical Decision Making Amount and/or Complexity of Data Reviewed Labs: ordered. Radiology: ordered.  Risk OTC drugs. Prescription drug management. Decision regarding hospitalization.   This patient presents to the ED for concern of fever, chills, weakness, this involves an extensive number of treatment options, and is a complaint that carries with it a high risk of complications and morbidity.  The differential diagnosis includes sepsis, pneumonia, urinary tract infection, bacteremia, electrolyte derangement, acute kidney injury, meningitis, encephalitis   Co morbidities that complicate the patient evaluation  Hyperlipidemia, anxiety   Additional history obtained:  Additional history obtained from family External records from outside source obtained and reviewed including medical records   Lab Tests:  I Ordered, and personally interpreted labs.  The pertinent results include: Leukocytosis, no anemia, normal kidney function liver function, hyponatremia, elevated lactic acid, negative respiratory panel   Imaging Studies ordered:  I ordered imaging studies including CT scan head, CTA chest, chest x-ray I independently visualized and interpreted imaging which showed no acute intracranial process, no pulmonary embolus, no acute cardiopulmonary process I agree with the radiologist interpretation   Cardiac Monitoring: / EKG:  The patient was maintained on a cardiac monitor.  I personally viewed and interpreted the cardiac monitored which showed an underlying rhythm of: Normal sinus rhythm, no ST/T wave changes, no ischemic changes, no STEMI   Consultations Obtained:  I requested consultation with the hospitalist,  and discussed lab and imaging findings as well as pertinent plan - they recommend:  Admission   Problem List / ED Course / Critical interventions / Medication management  Patient does remain stable at this time.  Vital signs have greatly improved with treatment in the emergency department.  He is  still requiring oxygen  supplementation at this point.  Patient did present septic with no clear source at this time though urinalysis is still pending.    Blood work does demonstrate elevated lactic acid and leukocytosis.  Suspect this may be secondary to bacteremia at this point.  He has been started with Rocephin , azithromycin , vancomycin.  CT of the chest demonstrated no indication of pulmonary embolus and no signs of pneumonia.  Have discussed patient case with Dr. FORBES Carwin with the hospital service who has excepted for admission. Patient has been fully evaluated by attending physician who is in agreement to plan at this time. Patient did have worsening of mentation while in the emergency department. He has been evaluated by hospitalist and will obtain MRI of the brain for further evaluation. Will sign patient out to Dr. Bernard pending results of MRI and determination if LP is warranted.  I ordered medication including IV fluids, azithromycin , Rocephin , vancomycin, DuoNeb, Toradol , Tylenol  for sepsis Reevaluation of the patient after these medicines showed that the patient improved I have reviewed the patients home medicines and have made adjustments as needed   Social Determinants of Health:  None   Test / Admission - Considered:  Admission     Final diagnoses:  None    ED Discharge Orders     None          Daralene Lonni BIRCH, PA-C 10/17/24 1757    Daralene Lonni BIRCH, PA-C 10/17/24 1757    Daralene Lonni BIRCH, PA-C 10/17/24 1858    Daralene Lonni BIRCH, PA-C 10/17/24 2046    Bernard Drivers, MD 10/18/24 1529

## 2024-10-17 NOTE — Consult Note (Signed)
 Pharmacy Antibiotic Note  [name] is a 63 y.o. male admitted on 10/17/2024 with meningitis.  Pharmacy has been consulted for Vancomycin, acyclovir, ampicillin, and rocephin  dosing.  Weight: 80.7 kg (178 lb)  Plan:  Rocephin  2G q12h Ampicillin 2G q4h Acyclovir 800 mg q8h Vancomycin 2,000 mg LD scheduled to be given at 2145 on 11/12 Vancomycin 750 mg IV Q 12 hrs ordered to start at 1000 on 11/13 . Goal AUC 400-550. Obtain levels at steady state or as clinically indicated.   Expected AUC: 497.8 SCr used: 1.18  Weight used for dosing: IBW Cmin: 15.7 Will monitor serum creatinine daily while on vanc. Follow renal function and cultures for adjustments      Weight: 80.7 kg (178 lb)  Temp (24hrs), Avg:100 F (37.8 C), Min:97.9 F (36.6 C), Max:102.2 F (39 C)  Recent Labs  Lab 10/17/24 1329 10/17/24 1512  WBC 13.1*  --   CREATININE 1.18  --   LATICACIDVEN 3.4* 2.9*    Estimated Creatinine Clearance: 64.3 mL/min (by C-G formula based on SCr of 1.18 mg/dL).    Allergies  Allergen Reactions   Morphine And Codeine Swelling   Bee Venom Rash    Antimicrobials this admission: 11/12 Rocephin  >> 11/12 Ampicillin >> 11/12 Acyclovir  >>  11/12 vancomycin >>    Microbiology results: 11/12 BCx: pending  Thank you for allowing pharmacy to be a part of this patient's care. Annabella LOISE Banks, PharmD Clinical Pharmacist 10/17/2024 9:41 PM

## 2024-10-17 NOTE — ED Notes (Signed)
 Patient transported to CT

## 2024-10-17 NOTE — Sepsis Progress Note (Signed)
 eLink is following this Code Sepsis.

## 2024-10-17 NOTE — ED Provider Notes (Addendum)
 Lumbar Puncture  Date/Time: 10/17/2024 9:21 PM  Performed by: Bernard Drivers, MD Authorized by: Bernard Drivers, MD   Consent:    Consent given by:  Patient and spouse   Risks discussed:  Bleeding, infection, repeat procedure, pain, nerve damage and headache Universal protocol:    Patient identity confirmed:  Verbally with patient and arm band (time out performed) Pre-procedure details:    Procedure purpose:  Diagnostic   Preparation: Patient was prepped and draped in usual sterile fashion   Anesthesia:    Anesthesia method:  Local infiltration   Local anesthetic:  Lidocaine  2% WITH epi Procedure details:    Lumbar space:  L4-L5 interspace   Patient position: both lateral decub and sitting positions tried.   Needle gauge:  20   Ultrasound guidance: no     Number of attempts:  2 Post-procedure details:    Puncture site:  Adhesive bandage applied   Procedure completion:  Tolerated well, no immediate complications Comments:     Not successful at obtaining csf.    Pt had been admitted to hospitalist service by prior ED team. Admitting team calls and indicates they would like LP done.  Discussed LP with patient, including risk/benefit, pt agreeable/gave consent.  I noted that pt had already received iv abx, and I added acyclovir.  LP attempt not successful. I informed admitting team. Iv abx and acyclovir are already ordered/given - admitting team can get fluoro LP in AM.    Harvir Patry, MD 10/17/24 2123      Bernard Drivers, MD 10/18/24 1318

## 2024-10-17 NOTE — H&P (Addendum)
 History and Physical    [name] FMW:[id number] DOB: 03/09/1961 DOA: 10/17/2024  PCP: Dettinger, Fonda LABOR, MD   Patient coming from: Home  I have personally briefly reviewed patient's old medical records in [facility]  Chief Complaint: AMS, weakness  HPI: [name] is a 63 y.o. male with medical history significant for small bowel obstruction, GERD. Patient presented to the ED with reports of generalized weakness, and a fall today.  At the time of my evaluation, patient is quite somnolent, and unable to provide history. Family reports he has been sick for 3 weeks, symptoms started with congestion, cough, headaches, they report nausea vomiting and diarrhea.  He has had persistent headaches also.  No neck pain.  He was seen as outpatient, prescribed course of doxycycline -started 11/7, spouse reports he was compliant with it, but symptoms persisted.  During my evaluation, spouse and grand niece at bedside reports that patient has not been responding to them, and from 4 PM while here in the ED, that his mental status has worsened, he did not recognize them, they report that he was unable to say their names or talk.  ED Course: Febrile to 102.2.  Heart rate 84-101.  Respirate rate 15-28.  Blood pressure systolic 116-135.  O2 sats down to 88% on room air. WBC 13.1.  Lactic acidosis of 3.4 >> 2.9. UA not suggestive of UTI. COVID influenza RSV negative. Chest x-ray negative for acute abnormality. Head CT unremarkable. CTA chest-no acute abnormality. IV vancomycin, ceftriaxone  and azithromycin  started. 2.5 L bolus given.  Review of Systems: As per HPI all other systems reviewed and negative.  Past Medical History:  Diagnosis Date   Allergy    Arthritis    Calcaneus fracture, left    Cancer (HCC)    Skin cancer Left Arm   GERD (gastroesophageal reflux disease)    Small bowel obstruction (HCC)     Past Surgical History:  Procedure Laterality Date   FOOT SURGERY      HAND SURGERY Right    contracture of hand   INGUINAL HERNIA REPAIR Right 11/23/2013   Procedure: HERNIA REPAIR INGUINAL ADULT;  Surgeon: Elsie GORMAN Holland, MD;  Location: [facility];  Service: General;  Laterality: Right;  site-inguinal area   OPEN REDUCTION, INTERNAL FIXATION (ORIF) CALCANEAL FRACTURE WITH FUSION Left 11/30/2018   Procedure: OPEN REDUCTION, INTERNAL FIXATION (ORIF) CALCANEAL FRACTURE WITH PERONEAL DEBRIDEMENT & REPAIR OF SUPERIOR PERONEAL RETINACULUM;  Surgeon: Elsa Lonni SAUNDERS, MD;  Location: [facility];  Service: Orthopedics;  Laterality: Left;   VASECTOMY     WRIST SURGERY Right    otif     reports that he has never smoked. He has never used smokeless tobacco. He reports current alcohol use of about 4.0 standard drinks of alcohol per week. He reports that he does not use drugs.  Allergies  Allergen Reactions   Morphine And Codeine Swelling   Bee Venom Rash    Family History  Problem Relation Age of Onset   Stroke Mother    Cancer Mother        colon   Alzheimer's disease Father        4 years   Heart disease Father        atrial fibri   Heart disease Sister    Early death Brother    Cancer Maternal Grandmother    Prior to Admission medications   Medication Sig Start Date End Date Taking? Authorizing Provider  Albuterol -Budesonide (AIRSUPRA) 90-80 MCG/ACT AERO  Inhale 2 puffs into the lungs as needed (wheezing, shortness of breath). Max of 12 puffs per 24 hours 10/12/24  Yes Joesph Annabella HERO, FNP  doxycycline  (VIBRA -TABS) 100 MG tablet Take 1 tablet (100 mg total) by mouth 2 (two) times daily for 7 days. 1 po bid 10/12/24 10/19/24 Yes Joesph Annabella HERO, FNP  esomeprazole (NEXIUM) 20 MG capsule Take 20 mg by mouth daily at 12 noon.   Yes [provider]  famotidine  (PEPCID ) 20 MG tablet Take 1 tablet (20 mg total) by mouth 2 (two) times daily as needed for heartburn or indigestion (breakthrough not controlled by Protonix ). 06/13/24  Yes Dettinger, Fonda LABOR,  MD  fluticasone  (FLONASE ) 50 MCG/ACT nasal spray Place 2 sprays into both nostrils daily. 09/28/24  Yes Hawks, Christy A, FNP  mirtazapine  (REMERON ) 7.5 MG tablet Take 1 tablet (7.5 mg total) by mouth at bedtime. For anxiety/ sleep 12/05/23  Yes Dettinger, Fonda LABOR, MD  ondansetron  (ZOFRAN ) 4 MG tablet Take 1 tablet (4 mg total) by mouth every 8 (eight) hours as needed for nausea or vomiting. 10/15/24  Yes Hawks, Christy A, FNP  sertraline  (ZOLOFT ) 50 MG tablet Take 1 tablet (50 mg total) by mouth daily. 12/05/23  Yes Dettinger, Fonda LABOR, MD  pantoprazole  (PROTONIX ) 40 MG tablet TAKE ONE (1) TABLET BY MOUTH EVERY DAY Patient not taking: Reported on 10/17/2024 09/20/24   Dettinger, Fonda LABOR, MD    Physical Exam: Exam limited by mental status Vitals:   10/17/24 1726 10/17/24 1745 10/17/24 1845 10/17/24 1900  BP: 135/85 127/85 118/79 124/79  Pulse: 93 91 85 84  Resp: 20 15 (!) 28 18  Temp:    97.9 F (36.6 C)  TempSrc:    Oral  SpO2: 94% 94% (!) 88% 93%  Weight:        Constitutional: Acutely ill-appearing, somnolent Vitals:   10/17/24 1726 10/17/24 1745 10/17/24 1845 10/17/24 1900  BP: 135/85 127/85 118/79 124/79  Pulse: 93 91 85 84  Resp: 20 15 (!) 28 18  Temp:    97.9 F (36.6 C)  TempSrc:    Oral  SpO2: 94% 94% (!) 88% 93%  Weight:       Eyes: PERRL, lids and conjunctivae normal ENMT: Mucous membranes are moist.   Neck: normal, supple, no masses, no thyromegaly Respiratory: On my evaluation, O2 sats dropped to 88% on room air, placed on 2 L clear to auscultation bilaterally, no wheezing, no crackles. Normal respiratory effort. No accessory muscle use.  Cardiovascular: Regular rate and rhythm, no murmurs / rubs / gallops. No extremity edema.  Extremities warm Abdomen: no tenderness, no masses palpated. No hepatosplenomegaly. Bowel sounds positive.  Musculoskeletal: no clubbing / cyanosis. No joint deformity upper and lower extremities.  Skin: no rashes, lesions, ulcers. No  induration Neurologic: No facial asymmetry, moves bilateral lower extremity to stimulation, able to grip my fingers. Psychiatric: Quite somnolent, required a lot of prodding to get him to answer any question, he was barely able to tell me his name, he knew he was in the hospital, but he was unable to accurately tell the names of his grand niece and spouse at bedside-calling them other names.  Labs on Admission: I have personally reviewed following labs and imaging studies  CBC: Recent Labs  Lab 10/17/24 1329  WBC 13.1*  NEUTROABS 11.5*  HGB 15.0  HCT 40.4  MCV 90.0  PLT 215   Basic Metabolic Panel: Recent Labs  Lab 10/17/24 1329  NA 130*  K  3.7  CL 95*  CO2 20*  GLUCOSE 106*  BUN 25*  CREATININE 1.18  CALCIUM  8.7*   GFR: Estimated Creatinine Clearance: 64.3 mL/min (by C-G formula based on SCr of 1.18 mg/dL). Liver Function Tests: Recent Labs  Lab 10/17/24 1329  AST 38  ALT 37  ALKPHOS 43  BILITOT 1.2  PROT 7.0  ALBUMIN 4.4   Coagulation Profile: Recent Labs  Lab 10/17/24 1329  INR 1.1   Urine analysis:    Component Value Date/Time   COLORURINE YELLOW 10/17/2024 1713   APPEARANCEUR TURBID (A) 10/17/2024 1713   LABSPEC >1.046 (H) 10/17/2024 1713   PHURINE 5.0 10/17/2024 1713   GLUCOSEU NEGATIVE 10/17/2024 1713   HGBUR SMALL (A) 10/17/2024 1713   BILIRUBINUR NEGATIVE 10/17/2024 1713   KETONESUR NEGATIVE 10/17/2024 1713   PROTEINUR 100 (A) 10/17/2024 1713   UROBILINOGEN 0.2 11/06/2014 2236   NITRITE NEGATIVE 10/17/2024 1713   LEUKOCYTESUR NEGATIVE 10/17/2024 1713    Radiological Exams on Admission: MR BRAIN WO CONTRAST Result Date: 10/17/2024 CLINICAL DATA:  Initial evaluation for acute altered mental status, fever. EXAM: MRI HEAD WITHOUT CONTRAST MRA HEAD WITHOUT CONTRAST TECHNIQUE: Multiplanar, multi-echo pulse sequences of the brain and surrounding structures were acquired without intravenous contrast. Angiographic images of the Circle of Willis  were acquired using MRA technique without intravenous contrast. COMPARISON:  CT from earlier the same day. FINDINGS: MRI HEAD FINDINGS Brain: Cerebral volume within normal limits. Abnormal diffusion and FLAIR signal abnormality seen involving the left insular cortex and inferior left frontal lobe, as well as the mesial left temporal lobe (series 5, images 25, 18). Gyral swelling and edema within this region without significant regional mass effect. Findings are nonspecific, but could reflect sequelae of acute encephalitis, including HSV encephalitis. Postictal changes or possibly infiltrating neoplasm would be the primary differential considerations. No other evidence for acute or subacute infarct. Gray-white matter differentiation otherwise maintained. No areas of chronic cortical infarction. No acute or chronic intracranial blood products. No mass lesion or midline shift. No hydrocephalus or extra-axial fluid collection. Pituitary gland within normal limits. Vascular: Major intracranial vascular flow voids are maintained. Skull and upper cervical spine: Craniocervical junction within normal limits. Mildly decreased T1 signal intensity within the visualized bone marrow, nonspecific, but most commonly related to anemia, smoking, or obesity. No scalp soft tissue abnormality. Sinuses/Orbits: Globes orbital soft tissues within normal limits. Paranasal sinuses are largely clear. No significant mastoid effusion. Other: None. MRA HEAD FINDINGS Anterior circulation: Both internal carotid arteries are patent through the siphons without stenosis or other abnormality. A1 segments patent bilaterally. Normal anterior communicating artery complex. Anterior cerebral arteries patent without stenosis. No M1 stenosis or occlusion. No proximal MCA branch occlusion or high-grade stenosis. Distal MCA branches perfused and symmetric. Posterior circulation: Visualized V4 segments widely patent without stenosis. Neither PICA origin  visualized. Basilar patent without stenosis. Superior cerebral arteries patent bilaterally. Predominant fetal type origin of the PCAs bilaterally. Both PCAs patent to their distal aspects without stenosis. Anatomic variants: As above.  No aneurysm. IMPRESSION: MRI HEAD: Abnormal diffusion and FLAIR signal abnormality involving the left insular cortex and inferior left frontal lobe, as well as the mesial left temporal lobe. Findings are nonspecific, but could reflect sequelae of acute encephalitis, which could be either infectious or inflammatory in nature (HSV encephalitis should be considered until proven otherwise). Postictal changes or possibly infiltrating neoplasm would be the primary differential considerations. Correlation with CSF analysis and EEG suggested. MRA HEAD: Normal intracranial MRA. Electronically Signed  By: Morene Hoard M.D.   On: 10/17/2024 20:28   MR ANGIO HEAD WO CONTRAST Result Date: 10/17/2024 CLINICAL DATA:  Initial evaluation for acute altered mental status, fever. EXAM: MRI HEAD WITHOUT CONTRAST MRA HEAD WITHOUT CONTRAST TECHNIQUE: Multiplanar, multi-echo pulse sequences of the brain and surrounding structures were acquired without intravenous contrast. Angiographic images of the Circle of Willis were acquired using MRA technique without intravenous contrast. COMPARISON:  CT from earlier the same day. FINDINGS: MRI HEAD FINDINGS Brain: Cerebral volume within normal limits. Abnormal diffusion and FLAIR signal abnormality seen involving the left insular cortex and inferior left frontal lobe, as well as the mesial left temporal lobe (series 5, images 25, 18). Gyral swelling and edema within this region without significant regional mass effect. Findings are nonspecific, but could reflect sequelae of acute encephalitis, including HSV encephalitis. Postictal changes or possibly infiltrating neoplasm would be the primary differential considerations. No other evidence for acute or  subacute infarct. Gray-white matter differentiation otherwise maintained. No areas of chronic cortical infarction. No acute or chronic intracranial blood products. No mass lesion or midline shift. No hydrocephalus or extra-axial fluid collection. Pituitary gland within normal limits. Vascular: Major intracranial vascular flow voids are maintained. Skull and upper cervical spine: Craniocervical junction within normal limits. Mildly decreased T1 signal intensity within the visualized bone marrow, nonspecific, but most commonly related to anemia, smoking, or obesity. No scalp soft tissue abnormality. Sinuses/Orbits: Globes orbital soft tissues within normal limits. Paranasal sinuses are largely clear. No significant mastoid effusion. Other: None. MRA HEAD FINDINGS Anterior circulation: Both internal carotid arteries are patent through the siphons without stenosis or other abnormality. A1 segments patent bilaterally. Normal anterior communicating artery complex. Anterior cerebral arteries patent without stenosis. No M1 stenosis or occlusion. No proximal MCA branch occlusion or high-grade stenosis. Distal MCA branches perfused and symmetric. Posterior circulation: Visualized V4 segments widely patent without stenosis. Neither PICA origin visualized. Basilar patent without stenosis. Superior cerebral arteries patent bilaterally. Predominant fetal type origin of the PCAs bilaterally. Both PCAs patent to their distal aspects without stenosis. Anatomic variants: As above.  No aneurysm. IMPRESSION: MRI HEAD: Abnormal diffusion and FLAIR signal abnormality involving the left insular cortex and inferior left frontal lobe, as well as the mesial left temporal lobe. Findings are nonspecific, but could reflect sequelae of acute encephalitis, which could be either infectious or inflammatory in nature (HSV encephalitis should be considered until proven otherwise). Postictal changes or possibly infiltrating neoplasm would be the  primary differential considerations. Correlation with CSF analysis and EEG suggested. MRA HEAD: Normal intracranial MRA. Electronically Signed   By: Morene Hoard M.D.   On: 10/17/2024 20:28   CT Angio Chest PE W/Cm &/Or Wo Cm Result Date: 10/17/2024 CLINICAL DATA:  Fall this morning. Ongoing weakness. Shortness of breath. Possible pulmonary embolism. EXAM: CT ANGIOGRAPHY CHEST WITH CONTRAST TECHNIQUE: Multidetector CT imaging of the chest was performed using the standard protocol during bolus administration of intravenous contrast. Multiplanar CT image reconstructions and MIPs were obtained to evaluate the vascular anatomy. RADIATION DOSE REDUCTION: This exam was performed according to the departmental dose-optimization program which includes automated exposure control, adjustment of the mA and/or kV according to patient size and/or use of iterative reconstruction technique. CONTRAST:  75mL OMNIPAQUE  IOHEXOL  350 MG/ML SOLN COMPARISON:  10/06/2024 FINDINGS: Cardiovascular: Borderline stable cardiomegaly. Thoracic aorta is normal in caliber. Normal takeoff of the great vessels from the aortic arch. Pulmonary arterial system is well opacified without evidence of emboli. Remaining vascular structures are unremarkable.  Mediastinum/Nodes: There is no mediastinal or hilar adenopathy. Remaining mediastinal structures are unremarkable. Lungs/Pleura: Lungs are adequately inflated. There is no acute airspace consolidation or effusion. Mild bibasilar dependent atelectasis. Airways are unremarkable. Upper Abdomen: No acute findings. Musculoskeletal: Mild anterior wedging of a vertebral body near the thoracolumbar junction unchanged. Review of the MIP images confirms the above findings. IMPRESSION: 1. No acute cardiopulmonary disease and no evidence of pulmonary embolism. 2. Borderline stable cardiomegaly. Electronically Signed   By: Toribio Agreste M.D.   On: 10/17/2024 16:34   CT Head Wo Contrast Result Date:  10/17/2024 EXAM: CT HEAD WITHOUT CONTRAST 10/17/2024 02:42:00 PM TECHNIQUE: CT of the head was performed without the administration of intravenous contrast. Automated exposure control, iterative reconstruction, and/or weight based adjustment of the mA/kV was utilized to reduce the radiation dose to as low as reasonably achievable. COMPARISON: CT head on 12/07/2023 CLINICAL HISTORY: Head trauma, abnormal mental status (Age 72-64y) FINDINGS: BRAIN AND VENTRICLES: No acute hemorrhage. No evidence of acute infarct. No hydrocephalus. No extra-axial collection. No mass effect or midline shift. ORBITS: No acute abnormality. SINUSES: No acute abnormality. SOFT TISSUES AND SKULL: No acute soft tissue abnormality. No skull fracture. IMPRESSION: 1. No acute intracranial abnormality. Electronically signed by: Gilmore Molt MD 10/17/2024 03:27 PM EST RP Workstation: [id number]   DG Chest Port 1 View Result Date: 10/17/2024 CLINICAL DATA:  Questionable sepsis. EXAM: PORTABLE CHEST 1 VIEW COMPARISON:  Chest radiograph dated 10/12/2024. FINDINGS: No focal consolidation pleural effusion or pneumothorax. The cardiac silhouette. No acute osseous pathology. IMPRESSION: No active disease. Electronically Signed   By: Vanetta Chou M.D.   On: 10/17/2024 14:09   EKG: Independently reviewed.  Sinus rhythm, rate 97, QTc 455.  No significant change from prior.  Assessment/Plan Principal Problem:   Acute encephalopathy Active Problems:   Severe sepsis (HCC)   Hyponatremia   Acute hypoxic respiratory failure (HCC)   Assessment and Plan:  Acute encephalopathy-  likely secondary to encephalitis/meningitis as suggested on MRI brain.  Patient quite somnolent, barely able to follow a few directions, confused.  Initial URI symptoms 2 to 3 weeks ago.  Completed outpatient course of doxycycline .  COVID influenza RSV negative.  Head CT negative for acute abnormality.  CTA chest negative for acute abnormality.  UA -rare bacteria,  not suggestive of UTI or etiology of patient's sepsis. -I talked to ED provider, Dr. Bernard, he agreed to do a lumbar puncture on patient - Family reported sudden aphasia in ED, hence MRI brain obtained-nonspecific findings could reflect acute encephalitis-infectious or inflammatory in nature.  HSV encephalitis should be considered until proven otherwise. - I talked to neurology on-call Dr. Vanessa- broad-spectrum antibiotics to include vancomycin and ceftriaxone  ampicillin, acyclovir, also dexamethasone .  In case patient should have a seizure or suspicion of one, give 2000 mg load of Keppra.  Admit to [facility] team will see in consult - LP attempted twice in the ED by ED provider was unsuccessful. -Remain n.p.o. till improvement in mental status - Care ordered instruction to page neurology [facility]  Severe sepsis-meeting severe sepsis criteria with fever of 102.2, tachycardia heart rate 84-101, tachypnea respirate rate 15-28, leukocytosis of 13.1.  With evidence of endorgan dysfunction, lactic acidosis of 3.4 > 2.9. -2.5 L bolus given, on N/s 125 cc/hr infusion - Continue broad-spectrum antibiotics per above - Follow-up blood cultures  Acute hypoxic respiratory failure-O2 sats 88% on room air.  Lung exam without wheezing or rhonchi.  Recent URI symptoms.  CTA  chest, negative for PE, or other acute cardiopulmonary disease.  - Monitor for now. - Alb Nebs as needed  Hyponatremia sodium 130. - Hydrate with normal saline   DVT prophylaxis: SCDs for now pending lumbar puncture Code Status: Full code Family Communication: Spouse and grand niece at bedside Disposition Plan: > 2 days Consults called: Neurology  Admission status:  Inpatient, stepdown I certify that at the point of admission it is my clinical judgment that the patient will require inpatient hospital care spanning beyond 2 midnights from the point of admission due to high intensity of service, high risk for  further deterioration and high frequency of surveillance required.   CRITICAL CARE Performed by: Tully FORBES Carwin   Total critical care time: 70  minutes  Critical care time was exclusive of separately billable procedures and treating other patients.  Critical care was necessary to treat or prevent imminent or life-threatening deterioration.  Critical care was time spent personally by me on the following activities: development of treatment plan with patient and/or surrogate as well as nursing, discussions with consultants, evaluation of patient's response to treatment, examination of patient, obtaining history from patient or surrogate, ordering and performing treatments and interventions, ordering and review of laboratory studies, ordering and review of radiographic studies, pulse oximetry and re-evaluation of patient's condition.   Author: Tully FORBES Carwin, MD 10/17/2024 10:32 PM  For on call review [email or web address].

## 2024-10-18 ENCOUNTER — Inpatient Hospital Stay (HOSPITAL_COMMUNITY)

## 2024-10-18 ENCOUNTER — Other Ambulatory Visit: Payer: Self-pay

## 2024-10-18 DIAGNOSIS — R569 Unspecified convulsions: Secondary | ICD-10-CM

## 2024-10-18 DIAGNOSIS — B1009 Other human herpesvirus encephalitis: Secondary | ICD-10-CM | POA: Diagnosis not present

## 2024-10-18 DIAGNOSIS — G9340 Encephalopathy, unspecified: Secondary | ICD-10-CM | POA: Diagnosis not present

## 2024-10-18 DIAGNOSIS — A419 Sepsis, unspecified organism: Secondary | ICD-10-CM | POA: Diagnosis not present

## 2024-10-18 DIAGNOSIS — J9601 Acute respiratory failure with hypoxia: Secondary | ICD-10-CM | POA: Diagnosis not present

## 2024-10-18 DIAGNOSIS — R4182 Altered mental status, unspecified: Secondary | ICD-10-CM | POA: Diagnosis not present

## 2024-10-18 LAB — MENINGITIS/ENCEPHALITIS PANEL (CSF)
Cryptococcus neoformans/gattii (CSF): NOT DETECTED
Cytomegalovirus (CSF): NOT DETECTED
Enterovirus (CSF): NOT DETECTED
Escherichia coli K1 (CSF): NOT DETECTED
Haemophilus influenzae (CSF): NOT DETECTED
Herpes simplex virus 1 (CSF): DETECTED — AB
Herpes simplex virus 2 (CSF): NOT DETECTED
Human herpesvirus 6 (CSF): NOT DETECTED
Human parechovirus (CSF): NOT DETECTED
Listeria monocytogenes (CSF): NOT DETECTED
Neisseria meningitis (CSF): NOT DETECTED
Streptococcus agalactiae (CSF): NOT DETECTED
Streptococcus pneumoniae (CSF): NOT DETECTED
Varicella zoster virus (CSF): NOT DETECTED

## 2024-10-18 LAB — BASIC METABOLIC PANEL WITH GFR
Anion gap: 11 (ref 5–15)
Anion gap: 11 (ref 5–15)
BUN: 16 mg/dL (ref 8–23)
BUN: 15 mg/dL (ref 8–23)
CO2: 19 mmol/L — ABNORMAL LOW (ref 22–32)
CO2: 20 mmol/L — ABNORMAL LOW (ref 22–32)
Calcium: 7.9 mg/dL — ABNORMAL LOW (ref 8.9–10.3)
Calcium: 7.8 mg/dL — ABNORMAL LOW (ref 8.9–10.3)
Chloride: 98 mmol/L (ref 98–111)
Chloride: 101 mmol/L (ref 98–111)
Creatinine, Ser: 0.95 mg/dL (ref 0.61–1.24)
Creatinine, Ser: 1 mg/dL (ref 0.61–1.24)
GFR, Estimated: >60 mL/min (ref >60)
GFR, Estimated: >60 mL/min (ref >60)
Glucose, Bld: 140 mg/dL — ABNORMAL HIGH (ref 70–99)
Glucose, Bld: 92 mg/dL (ref 70–99)
Potassium: 3.5 mmol/L (ref 3.5–5.1)
Potassium: 3.7 mmol/L (ref 3.5–5.1)
Sodium: 128 mmol/L — ABNORMAL LOW (ref 135–145)
Sodium: 132 mmol/L — ABNORMAL LOW (ref 135–145)

## 2024-10-18 LAB — CSF CELL COUNT WITH DIFFERENTIAL
Eosinophils, CSF: 0 % (ref 0–1)
Lymphs, CSF: 92 % — ABNORMAL HIGH (ref 40–80)
Monocyte-Macrophage-Spinal Fluid: 1 % — ABNORMAL LOW (ref 15–45)
RBC Count, CSF: 3285 /mm3 — ABNORMAL HIGH
Segmented Neutrophils-CSF: 7 % — ABNORMAL HIGH (ref 0–6)
Tube #: 1
WBC, CSF: 60 /mm3 (ref 0–5)

## 2024-10-18 LAB — PROTEIN AND GLUCOSE, CSF
Glucose, CSF: 55 mg/dL (ref 40–70)
Total  Protein, CSF: 92 mg/dL — ABNORMAL HIGH (ref 15–45)

## 2024-10-18 LAB — CBC
HCT: 33.9 % — ABNORMAL LOW (ref 39.0–52.0)
Hemoglobin: 12.5 g/dL — ABNORMAL LOW (ref 13.0–17.0)
MCH: 33.2 pg (ref 26.0–34.0)
MCHC: 36.9 g/dL — ABNORMAL HIGH (ref 30.0–36.0)
MCV: 89.9 fL (ref 80.0–100.0)
Platelets: 153 K/uL (ref 150–400)
RBC: 3.77 MIL/uL — ABNORMAL LOW (ref 4.22–5.81)
RDW: 13.2 % (ref 11.5–15.5)
WBC: 7.4 K/uL (ref 4.0–10.5)
nRBC: 0 % (ref 0.0–0.2)

## 2024-10-18 LAB — GLUCOSE, CAPILLARY
Glucose-Capillary: 132 mg/dL — ABNORMAL HIGH (ref 70–99)
Glucose-Capillary: 142 mg/dL — ABNORMAL HIGH (ref 70–99)
Glucose-Capillary: 96 mg/dL (ref 70–99)
Glucose-Capillary: 98 mg/dL (ref 70–99)

## 2024-10-18 LAB — LACTIC ACID, PLASMA
Lactic Acid, Venous: 1.8 mmol/L (ref 0.5–1.9)
Lactic Acid, Venous: 2.2 mmol/L (ref 0.5–1.9)

## 2024-10-18 LAB — HIV ANTIBODY (ROUTINE TESTING W REFLEX): HIV Screen 4th Generation wRfx: NONREACTIVE

## 2024-10-18 MED ORDER — LIDOCAINE 1 % OPTIME INJ - NO CHARGE
5.0000 mL | Freq: Once | INTRAMUSCULAR | Status: AC
  Administered 2024-10-18: 5 mL via INTRADERMAL
  Filled 2024-10-18: qty 6

## 2024-10-18 MED ORDER — SODIUM CHLORIDE 0.9% FLUSH
10.0000 mL | Freq: Two times a day (BID) | INTRAVENOUS | Status: DC
  Administered 2024-10-18 – 2024-10-24 (×12): 10 mL
  Administered 2024-10-24: 20 mL
  Administered 2024-10-25 – 2024-10-30 (×7): 10 mL

## 2024-10-18 MED ORDER — SODIUM CHLORIDE 0.9% FLUSH: 10.0000 mL | INTRAVENOUS | Status: DC | PRN

## 2024-10-18 MED ORDER — PANTOPRAZOLE SODIUM 40 MG IV SOLR
40.0000 mg | INTRAVENOUS | Status: DC
  Administered 2024-10-18 – 2024-10-23 (×6): 40 mg via INTRAVENOUS
  Filled 2024-10-18 (×6): qty 10

## 2024-10-18 MED ORDER — GADOBUTROL 1 MMOL/ML IV SOLN
8.0000 mL | Freq: Once | INTRAVENOUS | Status: AC | PRN
  Administered 2024-10-18: 8 mL via INTRAVENOUS

## 2024-10-18 NOTE — Plan of Care (Signed)
  Problem: Clinical Measurements: Goal: Diagnostic test results will improve Outcome: Progressing   Problem: Safety: Goal: Ability to remain free from injury will improve Outcome: Progressing   Problem: Skin Integrity: Goal: Risk for impaired skin integrity will decrease Outcome: Progressing   Problem: Clinical Measurements: Goal: Respiratory complications will improve Outcome: Progressing   Problem: Clinical Measurements: Goal: Cardiovascular complication will be avoided Outcome: Progressing

## 2024-10-18 NOTE — Procedures (Signed)
 PROCEDURE SUMMARY:  Successful fluoro-guided lumbar puncture at L4-L5.  Opening pressure 22.5.  14 mL cloudy, pink CSF collected in 4 sterile vials. Pt tolerated well.   Specimen was sent for labs.  EBL < 5mL  Solmon Selmer Ku PA-C 10/18/2024 1:47 PM

## 2024-10-18 NOTE — Consult Note (Addendum)
 NEUROLOGY CONSULT NOTE   Date of service: October 17, 2024 Patient Name: [name] MRN:  [id number] DOB:  [birth year] Chief Complaint: lethargy,  fever, confusion, headache, respiratory symptoms, MRI Brain concerning for encephalitis Requesting Provider: Pearlean Tully [name], [name]  History of Present Illness  [name] is a 63 y.o. male with hx of arthritis, GERD, prior SBO who presents with about 3 weeks of worsening generalized weakness, cough, congestion. He fell last night at home and was very confused and family brought him to the ED. Noted to be febrile to 102.2. He had MRI Brain w/o contrast which demonstrated L insula and frontal lobe DWI and FLAIR signal abnormality concerning for infectious or inflammatory encephalitis.  No obvious seizures. He was admitted to [facility] for neurology evaluation. He was started on empiric meningitis coverage with Vanc, ceftriaxone , ampicillin  and acyclovir .  LP at the bedside was attempted x 2 at [facility], ED and was unsuccessful.  Patient is mildly confused on unable to provide reliable history.    ROS  Unable to ascertain due to encephalopathy  Past History   Past Medical History:  Diagnosis Date   Allergy    Arthritis    Calcaneus fracture, left    Cancer (HCC)    Skin cancer Left Arm   GERD (gastroesophageal reflux disease)    Small bowel obstruction (HCC)     Past Surgical History:  Procedure Laterality Date   FOOT SURGERY     HAND SURGERY Right    contracture of hand   INGUINAL HERNIA REPAIR Right 11/23/2013   Procedure: HERNIA REPAIR INGUINAL ADULT;  Surgeon: Elsie GORMAN Holland, [name];  Location: [facility];  Service: General;  Laterality: Right;  site-inguinal area   OPEN REDUCTION, INTERNAL FIXATION (ORIF) CALCANEAL FRACTURE WITH FUSION Left 11/30/2018   Procedure: OPEN REDUCTION, INTERNAL FIXATION (ORIF) CALCANEAL FRACTURE WITH PERONEAL DEBRIDEMENT & REPAIR OF SUPERIOR PERONEAL RETINACULUM;  Surgeon: Elsa Lonni SAUNDERS, [name];  Location: [facility] OR;  Service: Orthopedics;  Laterality: Left;   VASECTOMY     WRIST SURGERY Right    otif    Family History: Family History  Problem Relation Age of Onset   Stroke Mother    Cancer Mother        colon   Alzheimer's disease Father        39 years   Heart disease Father        atrial fibri   Heart disease Sister    Early death Brother    Cancer Maternal Grandmother     Social History  reports that he has never smoked. He has never used smokeless tobacco. He reports current alcohol use of about 4.0 standard drinks of alcohol per week. He reports that he does not use drugs.  Allergies  Allergen Reactions   Morphine And Codeine Swelling   Bee Venom Rash    Medications   Current Facility-Administered Medications:    0.9 %  sodium chloride  infusion, , Intravenous, Continuous, [name], [name], [name], Last Rate: 125 mL/hr at 10/17/24 2350, New Bag at 10/17/24 2350   acetaminophen  (TYLENOL ) tablet 650 mg, 650 mg, Oral, Q6H PRN **OR** acetaminophen  (TYLENOL ) suppository 650 mg, 650 mg, Rectal, Q6H PRN, [name], [name], [name]   acyclovir  (ZOVIRAX ) 800 mg in dextrose  5 % 250 mL IVPB, 800 mg, Intravenous, Q8H, [name], [name], [name], Last Rate: 266 mL/hr at 10/17/24 2214, 800 mg at 10/17/24 2214   ampicillin  (OMNIPEN) 2 g in sodium chloride  0.9 %  100 mL IVPB, 2 g, Intravenous, Q4H, [name], [name], [name], Last Rate: 300 mL/hr at 10/17/24 2215, 2 g at 10/17/24 2215   cefTRIAXone  (ROCEPHIN ) 2 g in sodium chloride  0.9 % 100 mL IVPB, 2 g, Intravenous, Q12H, [name], [name], [name]   Chlorhexidine  Gluconate Cloth 2 % PADS 6 each, 6 each, Topical, Daily, [name], [name], [name], 6 each at 10/17/24 2320   dexamethasone  (DECADRON ) injection 10 mg, 10 mg, Intravenous, Q6H, [name], [name], [name], 10 mg at 10/17/24 2346   ondansetron  (ZOFRAN ) tablet 4 mg, 4 mg, Oral, Q6H PRN **OR** ondansetron  (ZOFRAN ) injection 4 mg, 4 mg, Intravenous, Q6H PRN,  [name], [name], [name]   polyethylene glycol (MIRALAX  / GLYCOLAX ) packet 17 g, 17 g, Oral, Daily PRN, [name], [name], [name]   vancomycin  (VANCOCIN ) IVPB 1000 mg/200 mL premix, 1,000 mg, Intravenous, Once, [name], [name], [name], Last Rate: 200 mL/hr at 10/18/24 0012, 1,000 mg at 10/18/24 0012   vancomycin  (VANCOREADY) IVPB 750 mg/150 mL, 750 mg, Intravenous, Q12H, [name], [name], [name]  Vitals   Vitals:   10/17/24 1745 10/17/24 1845 10/17/24 1900 10/17/24 2318  BP: 127/85 118/79 124/79 124/67  Pulse: 91 85 84 80  Resp: 15 (!) 28 18 (!) 21  Temp:   97.9 F (36.6 C) 99 F (37.2 C)  TempSrc:   Oral Oral  SpO2: 94% (!) 88% 93% 93%  Weight:        Body mass index is 29.62 kg/m.   Physical Exam   General: Laying comfortably in bed; in no acute distress.  HENT: Normal oropharynx and mucosa. Normal external appearance of ears and nose.  Neck: Supple, no pain or tenderness  CV: No JVD. No peripheral edema.  Pulmonary: Symmetric Chest rise. Normal respiratory effort.  Abdomen: Soft to touch, non-tender.  Ext: No cyanosis, edema, or deformity  Skin: No rash. Normal palpation of skin.   Musculoskeletal: Normal digits and nails by inspection. No clubbing.   Neurologic Examination  Mental status/Cognition: Alert, oriented to self, place, month and confused between whether this is 1964 of 2025, poor attention.  Speech/language: Fluent, comprehension intact, object naming intact. Cranial nerves:   CN II Pupils equal and reactive to light, no VF deficits    CN III,IV,VI EOM intact, no gaze preference or deviation, no nystagmus    CN V normal sensation in V1, V2, and V3 segments bilaterally    CN VII no asymmetry, no nasolabial fold flattening    CN VIII normal hearing to speech    CN IX & X normal palatal elevation, no uvular deviation    CN XI 5/5 head turn and 5/5 shoulder shrug bilaterally    CN XII midline tongue protrusion    Motor:  Muscle bulk: normal, tone  normal, pronator drift none tremor none Mvmt Root Nerve  Muscle Right Left Comments  SA C5/6 Ax Deltoid 5 5   EF C5/6 Mc Biceps 5 5   EE C6/7/8 Rad Triceps 5 5   WF C6/7 Med FCR     WE C7/8 PIN ECU     F Ab C8/T1 U ADM/FDI 5 5   HF L1/2/3 Fem Illopsoas 5 5   KE L2/3/4 Fem Quad 5 5   DF L4/5 D Peron Tib Ant 5 5   PF S1/2 Tibial Grc/Sol 5 5    Sensation:  Light touch Intact throughout   Pin prick    Temperature    Vibration   Proprioception    Coordination/Complex  Motor:  - Finger to Nose intact BL - Heel to shin intact BL - Rapid alternating movement are slowed - Gait: deferred.  Labs/Imaging/Neurodiagnostic studies   CBC:  Recent Labs  Lab November 04, 2024 1329  WBC 13.1*  NEUTROABS 11.5*  HGB 15.0  HCT 40.4  MCV 90.0  PLT 215   Basic Metabolic Panel:  Lab Results  Component Value Date   NA 130 (L) 11/04/24   K 3.7 11/04/2024   CO2 20 (L) Nov 04, 2024   GLUCOSE 106 (H) 11-04-24   BUN 25 (H) 11-04-2024   CREATININE 1.18 Nov 04, 2024   CALCIUM  8.7 (L) 11-04-24   GFRNONAA >60 2024/11/04   GFRAA 92 01/16/2021   Lipid Panel:  Lab Results  Component Value Date   LDLCALC 155 (H) 06/13/2024   HgbA1c:  Lab Results  Component Value Date   HGBA1C 5.1 11/19/2021   Urine Drug Screen: No results found for: LABOPIA, COCAINSCRNUR, LABBENZ, AMPHETMU, THCU, LABBARB  Alcohol Level No results found for: [facility] INR  Lab Results  Component Value Date   INR 1.1 November 04, 2024   APTT  Lab Results  Component Value Date   APTT 26 11/19/2019   AED levels: No results found for: PHENYTOIN, ZONISAMIDE, LAMOTRIGINE, LEVETIRACETA  CT Head without contrast(Personally reviewed): CTH was negative for a large hypodensity concerning for a large territory infarct or hyperdensity concerning for an ICH   MR Angio head without contrast(Personally reviewed): Normal  MRI Brain(Personally reviewed): Abnormal diffusion and FLAIR signal abnormality involving the  left insular cortex and inferior left frontal lobe, as well as the mesial left temporal lobe. Findings are nonspecific, but could reflect sequelae of acute encephalitis, which could be either infectious or inflammatory in nature (HSV encephalitis should be considered until proven otherwise). Postictal changes or possibly infiltrating neoplasm would be the primary differential considerations. Correlation with CSF analysis and EEG suggested.  Neurodiagnostics rEEG:  pending  ASSESSMENT   [name] is a 63 y.o. male ith hx of arthritis, GERD, prior SBO who presents with about 3 weeks of worsening generalized weakness, cough, congestion. He fell last night at home and was very confused and family brought him to the ED. Noted to be febrile to 102.2. He had MRI Brain w/o contrast which demonstrated L insula and frontal lobe DWI and FLAIR signal abnormality concerning for infectious or inflammatory encephalitis.  No obvious seizures. He was admitted to [facility] for neurology evaluation. He was started on empiric meningitis coverage with Vanc, ceftriaxone , ampicillin  and acyclovir .  LP at the bedside was attempted x 2 at [facility], ED and was unsuccessful.  Neuro exam is non focal with slightly poor attention and exhaustion/encephalopathy. He does not have any focal deficits. Negative kernigs, brudzinski, no neck rigiditiy.  Overall, imaging and presentations is concerning for viral meningitis/encephalitis, specially with noted MRI brain findings, fever and encephalopathy. Howeverm bacterial meningitis and post viral autoimmune encephalitis also on the differential.  RECOMMENDATIONS  - rEEG in AM - LP with CSF cell count and differential x 2, protein and glucose CSF, meningitis/encephalitis panel, CSF Gram stain and culture, CSF and serum autoimmune encephalitis panel, IgG index and oligoclonal bands. -MRI of the brain with contrast - Fluoro LP in  AM. ______________________________________________________________________  Plan discussed with patient and with Dr. Pearlean with the hospitalist team at [facility] initially and then later with Dr. Charlton with the Hospitalist team overnight.  I personally spent a total of 75 minutes in the care of the patient today including preparing  to see the patient, getting/reviewing separately obtained history, performing a medically appropriate exam/evaluation, placing orders, referring and communicating with other health care professionals, documenting clinical information in the EHR, independently interpreting results, communicating results, and coordinating care.   Signed, Bryce Cheever, [name] [facility]

## 2024-10-18 NOTE — Progress Notes (Signed)
 PHARMACY - PHYSICIAN COMMUNICATION  CRITICAL VALUE ALERT - Meningitis / Encephalitis Panel  [name] is an 63 y.o. male who presented to [facility] on 10/17/2024 with a chief complaint of AMS  Assessment: Lumbar puncture performed for concerns of meningitis / encephalitis.   Name of physician (or Provider) ContactedBETHA Riser  Current anti-infectives: acyclovir 10 mg/kg iv q8h Changes to prescribed anti-infectives recommended:   Patient is on recommended anti-infectives - no changes needed  Results for orders placed or performed during the hospital encounter of 10/17/24  Meningitis/Encephalitis Panel (CSF) (Collected: 10/18/2024 12:12 AM)  Result Value Ref Range   Cryptococcus neoformans/gattii (CSF) NOT DETECTED NOT DETECTED   Cytomegalovirus (CSF) NOT DETECTED NOT DETECTED   Enterovirus (CSF) NOT DETECTED NOT DETECTED   Escherichia coli K1 (CSF) NOT DETECTED NOT DETECTED   Haemophilus influenzae (CSF) NOT DETECTED NOT DETECTED   Herpes simplex virus 1 (CSF) DETECTED (A) NOT DETECTED   Herpes simplex virus 2 (CSF) NOT DETECTED NOT DETECTED   Human herpesvirus 6 (CSF) NOT DETECTED NOT DETECTED   Human parechovirus (CSF) NOT DETECTED NOT DETECTED   Listeria monocytogenes (CSF) NOT DETECTED NOT DETECTED   Neisseria meningitis (CSF) NOT DETECTED NOT DETECTED   Streptococcus agalactiae (CSF) NOT DETECTED NOT DETECTED   Streptococcus pneumoniae (CSF) NOT DETECTED NOT DETECTED   Varicella zoster virus (CSF) NOT DETECTED NOT DETECTED     Demarie Uhlig BS, PharmD, BCPS Clinical Pharmacist 10/18/2024 4:10 PM  Contact: [phone] after 3 PM

## 2024-10-18 NOTE — Progress Notes (Signed)
 Peripherally Inserted Central Catheter Placement  The IV Nurse has discussed with the patient and/or persons authorized to consent for the patient, the purpose of this procedure and the potential benefits and risks involved with this procedure.  The benefits include less needle sticks, lab draws from the catheter, and the patient may be discharged home with the catheter. Risks include, but not limited to, infection, bleeding, blood clot (thrombus formation), and puncture of an artery; nerve damage and irregular heartbeat and possibility to perform a PICC exchange if needed/ordered by physician.  Alternatives to this procedure were also discussed.  Bard Power PICC patient education guide, fact sheet on infection prevention and patient information card has been provided to patient /or left at bedside.    PICC Placement Documentation  PICC Double Lumen 10/18/24 Left Brachial 46 cm (Active)  Indication for Insertion or Continuance of Line Limited venous access - need for IV therapy >5 days (PICC only) 10/18/24 1919  Exposed Catheter (cm) 0 cm 10/18/24 1919  Site Assessment Clean, Dry, Intact 10/18/24 1919  Lumen #1 Status Saline locked;Blood return noted 10/18/24 1919  Lumen #2 Status Saline locked;Blood return noted 10/18/24 1919  Dressing Type Transparent;Securing device 10/18/24 1919  Dressing Status Antimicrobial disc/dressing in place;Clean, Dry, Intact 10/18/24 1919  Line Care Connections checked and tightened 10/18/24 1919  Line Adjustment (NICU/IV Team Only) No 10/18/24 1919  Dressing Intervention New dressing 10/18/24 1919  Dressing Change Due 10/25/24 10/18/24 1919   Consent obtained from wife [name], at bedside.    [name] 10/18/2024, 7:21 PM

## 2024-10-18 NOTE — Procedures (Signed)
 Patient Name: YAHIR TAVANO  MRN: [id number]  Epilepsy Attending: Arlin MALVA Krebs  Referring Physician/Provider: Khaliqdina, Salman, MD  Date: 10/18/2024 Duration: 23.06 mins  Patient history: 64yo M with ams. EEG to evaluate for seizure  Level of alertness: Awake  AEDs during EEG study: None  Technical aspects: This EEG study was done with scalp electrodes positioned according to the 10-20 International system of electrode placement. Electrical activity was reviewed with band pass filter of 1-70Hz , sensitivity of 7 uV/mm, display speed of 45mm/sec with a 60Hz  notched filter applied as appropriate. EEG data were recorded continuously and digitally stored.  Video monitoring was available and reviewed as appropriate.  Description: The posterior dominant rhythm consists of 8 Hz activity of moderate voltage (25-35 uV) seen predominantly in posterior head regions, asymmetric( left<right) and reactive to eye opening and eye closing. EEG showed continuous 3 to 6 Hz theta-delta slowing in left hemisphere admixed with intermittent generalized 3-5hz  theta-delta slowing.SABRA Hyperventilation and photic stimulation were not performed.     ABNORMALITY - Intermittent slow, generalized - Continuous slow, left hemisphere  IMPRESSION: This study is suggestive of  suggestive of cortical dysfunction arising from left hemisphere likely secondary to underlying structural abnormality. Additionally there is generalized cerebral dysfunction (encephalopathy). No seizures or epileptiform discharges were seen throughout the recording.  Joelie Schou O Dvonte Gatliff

## 2024-10-18 NOTE — Progress Notes (Signed)
 Progress Note   Patient: [name] FMW:[id number] DOB: 01/07/1961 DOA: 10/17/2024     1 DOS: the patient was seen and examined on 10/18/2024   Brief hospital course: [name] is a 63 y.o. male with medical history significant for small bowel obstruction, GERD presented to the ED with reports of generalized weakness, and a fall today.  Family reports he has been sick for 3 weeks, symptoms started with congestion, cough, headaches, they report nausea vomiting and diarrhea.  He has had persistent headaches also. No neck pain. He was seen as outpatient, prescribed course of doxycycline -started 11/7, spouse reports he was compliant with it, but symptoms persisted.  Spouse and grand niece at bedside reports that patient has not been responding to them, and from 4 PM while here in the ED, that his mental status has worsened, he did not recognize them, they report that he was unable to say their names or talk.   MRI head Abnormal diffusion and FLAIR signal abnormality involving the left insular cortex and inferior left frontal lobe, as well as the mesial left temporal lobe. Findings are nonspecific, but could reflect sequelae of acute encephalitis, which could be either infectious or inflammatory in nature (HSV encephalitis should be considered until proven otherwise).   Patient is transferred to [facility] facility for neurology evaluation and further management.  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 Meningitis/ encephalitis panel positive for HSV 1. EEG showed cortical dysfunction arising from left hemisphere likely secondary to underlying structural abnormality. MRI brain with contrast - Increased T2 signal within the left mesial temporal lobe, without abnormal enhancement. Continue Acyclovir therapy per pharmacy protocol with IV hydration. Stop IV steroids.  Continue droplet  precautions. Discussed with family at bedside regarding diagnosis. ID consultation will be obtained.  Severe sepsis- He presented with fever, tachycardia, tachypnea, hypoxia, altered mental status. Lactic acid 2.9. Continue IV fluids 125ml/hr. Continue broad spectrum antibiotics, Acyclovir. Monitor vitals closely.  Await final blood cultures before stopping vanc, rocephin  and ampicillin.  Acute hypoxic respiratory failure He presented with hypoxia, altered, Po2 58 on blood gas. Continue supplemental oxygen  to maintain saturation above 92%. Duonebs PRN.  Hyponatremia- Due to hypovolemia. Continue IV hydration. Trend Na. Repeat BMP ordered.  GERD continue IV PPI.    PT/ OT once more awake. Nursing supportive care. Fall, aspiration precautions. Diet:  Diet Orders (From admission, onward)     Start     Ordered   10/17/24 1308  Diet NPO time specified  (Septic presentation on arrival (screening labs, nursing and treatment orders for obvious sepsis))  Diet effective now        10/17/24 1308           DVT prophylaxis: SCDs Start: 10/17/24 2340  Level of care: Progressive   Code Status: Full Code  Subjective: Patient is seen and examined today morning. He is more sleepy and lethargic. Opens eyes with painful stimuli. Family at bedside concerned.  Physical Exam: Vitals:   10/18/24 0349 10/18/24 0741 10/18/24 1116 10/18/24 1349  BP: 114/76 102/72 115/74 121/73  Pulse: 88 84 77 77  Resp: (!) 21 20 (!) 24 20  Temp: 98.4 F (36.9 C) 99.2 F (37.3 C) 100 F (37.8 C)   TempSrc: Oral Oral Oral   SpO2: 94% 92% 91% 94%  Weight:      Height:        General - Elderly Caucasian male, lethargic,  no apparent distress HEENT - PERRLA, EOMI, atraumatic head, non tender sinuses. Lung - Clear, basal rales, no rhonchi, wheezes. Heart - S1, S2 heard, no murmurs, rubs, trace pedal edema. Abdomen - Soft, non tender, bowel sounds good Neuro - sleepy and lethargic, unable to do full  neuro exam. Skin - Warm and dry.  Data Reviewed:      Latest Ref Rng & Units 10/18/2024    6:58 AM 10/17/2024    1:29 PM 10/06/2024    2:03 PM  CBC  WBC 4.0 - 10.5 K/uL 7.4  13.1  12.9   Hemoglobin 13.0 - 17.0 g/dL 87.4  84.9  83.6   Hematocrit 39.0 - 52.0 % 33.9  40.4  47.6   Platelets 150 - 400 K/uL 153  215  267       Latest Ref Rng & Units 10/18/2024    6:58 AM 10/17/2024    1:29 PM 10/06/2024    2:03 PM  BMP  Glucose 70 - 99 mg/dL 859  893  892   BUN 8 - 23 mg/dL 16  25  20    Creatinine 0.61 - 1.24 mg/dL 9.04  8.81  8.96   Sodium 135 - 145 mmol/L 128  130  137   Potassium 3.5 - 5.1 mmol/L 3.5  3.7  4.3   Chloride 98 - 111 mmol/L 98  95  98   CO2 22 - 32 mmol/L 19  20  28    Calcium  8.9 - 10.3 mg/dL 7.9  8.7  9.0    EEG adult Result Date: 10/18/2024 Shelton Arlin KIDD, MD     10/18/2024  2:43 PM Patient Name: [name] MRN: [id number] Epilepsy Attending: Arlin KIDD Shelton Referring Physician/Provider: Khaliqdina, Salman, MD Date: 10/18/2024 Duration: 23.06 mins Patient history: 63yo M with ams. EEG to evaluate for seizure Level of alertness: Awake AEDs during EEG study: None Technical aspects: This EEG study was done with scalp electrodes positioned according to the 10-20 International system of electrode placement. Electrical activity was reviewed with band pass filter of 1-70Hz , sensitivity of 7 uV/mm, display speed of 60mm/sec with a 60Hz  notched filter applied as appropriate. EEG data were recorded continuously and digitally stored.  Video monitoring was available and reviewed as appropriate. Description: The posterior dominant rhythm consists of 8 Hz activity of moderate voltage (25-35 uV) seen predominantly in posterior head regions, asymmetric( left<right) and reactive to eye opening and eye closing. EEG showed continuous 3 to 6 Hz theta-delta slowing in left hemisphere admixed with intermittent generalized 3-5hz  theta-delta slowing.SABRA Hyperventilation and photic  stimulation were not performed.   ABNORMALITY - Intermittent slow, generalized - Continuous slow, left hemisphere IMPRESSION: This study is suggestive of  suggestive of cortical dysfunction arising from left hemisphere likely secondary to underlying structural abnormality. Additionally there is generalized cerebral dysfunction (encephalopathy). No seizures or epileptiform discharges were seen throughout the recording. Arlin KIDD Shelton   MR BRAIN W CONTRAST Result Date: 10/18/2024 EXAM: MRI BRAIN WITH CONTRAST 10/18/2024 03:26:00 AM TECHNIQUE: Multiplanar multisequence MRI of the head/brain was performed with the administration of intravenous contrast. CONTRAST: 8 mL Gadobutrol. COMPARISON: MR Head 10/17/2024. CLINICAL HISTORY: Encephalitis. FINDINGS: BRAIN AND VENTRICLES: No acute infarct. No acute intracranial hemorrhage. No mass effect or midline shift. No hydrocephalus. The sella is unremarkable. Normal flow voids. There is no abnormal parenchymal or meningeal enhancement present. There is increased T2 signal again demonstrated within the left mesial temporal lobe. ORBITS: No acute abnormality. SINUSES: No acute abnormality. BONES AND SOFT  TISSUES: Normal bone marrow signal and enhancement. No acute soft tissue abnormality. IMPRESSION: 1. No acute intracranial abnormality. 2. Increased T2 signal within the left mesial temporal lobe, without abnormal enhancement. Clinical correlation and follow up to ensure resolution is recommended. Electronically signed by: Evalene Coho MD 10/18/2024 05:20 AM EST RP Workstation: [id number]   MR BRAIN WO CONTRAST Result Date: 10/17/2024 CLINICAL DATA:  Initial evaluation for acute altered mental status, fever. EXAM: MRI HEAD WITHOUT CONTRAST MRA HEAD WITHOUT CONTRAST TECHNIQUE: Multiplanar, multi-echo pulse sequences of the brain and surrounding structures were acquired without intravenous contrast. Angiographic images of the Circle of Willis were acquired using MRA  technique without intravenous contrast. COMPARISON:  CT from earlier the same day. FINDINGS: MRI HEAD FINDINGS Brain: Cerebral volume within normal limits. Abnormal diffusion and FLAIR signal abnormality seen involving the left insular cortex and inferior left frontal lobe, as well as the mesial left temporal lobe (series 5, images 25, 18). Gyral swelling and edema within this region without significant regional mass effect. Findings are nonspecific, but could reflect sequelae of acute encephalitis, including HSV encephalitis. Postictal changes or possibly infiltrating neoplasm would be the primary differential considerations. No other evidence for acute or subacute infarct. Gray-white matter differentiation otherwise maintained. No areas of chronic cortical infarction. No acute or chronic intracranial blood products. No mass lesion or midline shift. No hydrocephalus or extra-axial fluid collection. Pituitary gland within normal limits. Vascular: Major intracranial vascular flow voids are maintained. Skull and upper cervical spine: Craniocervical junction within normal limits. Mildly decreased T1 signal intensity within the visualized bone marrow, nonspecific, but most commonly related to anemia, smoking, or obesity. No scalp soft tissue abnormality. Sinuses/Orbits: Globes orbital soft tissues within normal limits. Paranasal sinuses are largely clear. No significant mastoid effusion. Other: None. MRA HEAD FINDINGS Anterior circulation: Both internal carotid arteries are patent through the siphons without stenosis or other abnormality. A1 segments patent bilaterally. Normal anterior communicating artery complex. Anterior cerebral arteries patent without stenosis. No M1 stenosis or occlusion. No proximal MCA branch occlusion or high-grade stenosis. Distal MCA branches perfused and symmetric. Posterior circulation: Visualized V4 segments widely patent without stenosis. Neither PICA origin visualized. Basilar patent  without stenosis. Superior cerebral arteries patent bilaterally. Predominant fetal type origin of the PCAs bilaterally. Both PCAs patent to their distal aspects without stenosis. Anatomic variants: As above.  No aneurysm. IMPRESSION: MRI HEAD: Abnormal diffusion and FLAIR signal abnormality involving the left insular cortex and inferior left frontal lobe, as well as the mesial left temporal lobe. Findings are nonspecific, but could reflect sequelae of acute encephalitis, which could be either infectious or inflammatory in nature (HSV encephalitis should be considered until proven otherwise). Postictal changes or possibly infiltrating neoplasm would be the primary differential considerations. Correlation with CSF analysis and EEG suggested. MRA HEAD: Normal intracranial MRA. Electronically Signed   By: Morene Hoard M.D.   On: 10/17/2024 20:28   MR ANGIO HEAD WO CONTRAST Result Date: 10/17/2024 CLINICAL DATA:  Initial evaluation for acute altered mental status, fever. EXAM: MRI HEAD WITHOUT CONTRAST MRA HEAD WITHOUT CONTRAST TECHNIQUE: Multiplanar, multi-echo pulse sequences of the brain and surrounding structures were acquired without intravenous contrast. Angiographic images of the Circle of Willis were acquired using MRA technique without intravenous contrast. COMPARISON:  CT from earlier the same day. FINDINGS: MRI HEAD FINDINGS Brain: Cerebral volume within normal limits. Abnormal diffusion and FLAIR signal abnormality seen involving the left insular cortex and inferior left frontal lobe, as well as the mesial left temporal  lobe (series 5, images 25, 18). Gyral swelling and edema within this region without significant regional mass effect. Findings are nonspecific, but could reflect sequelae of acute encephalitis, including HSV encephalitis. Postictal changes or possibly infiltrating neoplasm would be the primary differential considerations. No other evidence for acute or subacute infarct. Gray-white  matter differentiation otherwise maintained. No areas of chronic cortical infarction. No acute or chronic intracranial blood products. No mass lesion or midline shift. No hydrocephalus or extra-axial fluid collection. Pituitary gland within normal limits. Vascular: Major intracranial vascular flow voids are maintained. Skull and upper cervical spine: Craniocervical junction within normal limits. Mildly decreased T1 signal intensity within the visualized bone marrow, nonspecific, but most commonly related to anemia, smoking, or obesity. No scalp soft tissue abnormality. Sinuses/Orbits: Globes orbital soft tissues within normal limits. Paranasal sinuses are largely clear. No significant mastoid effusion. Other: None. MRA HEAD FINDINGS Anterior circulation: Both internal carotid arteries are patent through the siphons without stenosis or other abnormality. A1 segments patent bilaterally. Normal anterior communicating artery complex. Anterior cerebral arteries patent without stenosis. No M1 stenosis or occlusion. No proximal MCA branch occlusion or high-grade stenosis. Distal MCA branches perfused and symmetric. Posterior circulation: Visualized V4 segments widely patent without stenosis. Neither PICA origin visualized. Basilar patent without stenosis. Superior cerebral arteries patent bilaterally. Predominant fetal type origin of the PCAs bilaterally. Both PCAs patent to their distal aspects without stenosis. Anatomic variants: As above.  No aneurysm. IMPRESSION: MRI HEAD: Abnormal diffusion and FLAIR signal abnormality involving the left insular cortex and inferior left frontal lobe, as well as the mesial left temporal lobe. Findings are nonspecific, but could reflect sequelae of acute encephalitis, which could be either infectious or inflammatory in nature (HSV encephalitis should be considered until proven otherwise). Postictal changes or possibly infiltrating neoplasm would be the primary differential  considerations. Correlation with CSF analysis and EEG suggested. MRA HEAD: Normal intracranial MRA. Electronically Signed   By: Morene Hoard M.D.   On: 10/17/2024 20:28   CT Angio Chest PE W/Cm &/Or Wo Cm Result Date: 10/17/2024 CLINICAL DATA:  Fall this morning. Ongoing weakness. Shortness of breath. Possible pulmonary embolism. EXAM: CT ANGIOGRAPHY CHEST WITH CONTRAST TECHNIQUE: Multidetector CT imaging of the chest was performed using the standard protocol during bolus administration of intravenous contrast. Multiplanar CT image reconstructions and MIPs were obtained to evaluate the vascular anatomy. RADIATION DOSE REDUCTION: This exam was performed according to the departmental dose-optimization program which includes automated exposure control, adjustment of the mA and/or kV according to patient size and/or use of iterative reconstruction technique. CONTRAST:  75mL OMNIPAQUE  IOHEXOL  350 MG/ML SOLN COMPARISON:  10/06/2024 FINDINGS: Cardiovascular: Borderline stable cardiomegaly. Thoracic aorta is normal in caliber. Normal takeoff of the great vessels from the aortic arch. Pulmonary arterial system is well opacified without evidence of emboli. Remaining vascular structures are unremarkable. Mediastinum/Nodes: There is no mediastinal or hilar adenopathy. Remaining mediastinal structures are unremarkable. Lungs/Pleura: Lungs are adequately inflated. There is no acute airspace consolidation or effusion. Mild bibasilar dependent atelectasis. Airways are unremarkable. Upper Abdomen: No acute findings. Musculoskeletal: Mild anterior wedging of a vertebral body near the thoracolumbar junction unchanged. Review of the MIP images confirms the above findings. IMPRESSION: 1. No acute cardiopulmonary disease and no evidence of pulmonary embolism. 2. Borderline stable cardiomegaly. Electronically Signed   By: Toribio Agreste M.D.   On: 10/17/2024 16:34   CT Head Wo Contrast Result Date: 10/17/2024 EXAM: CT HEAD  WITHOUT CONTRAST 10/17/2024 02:42:00 PM TECHNIQUE: CT of the head  was performed without the administration of intravenous contrast. Automated exposure control, iterative reconstruction, and/or weight based adjustment of the mA/kV was utilized to reduce the radiation dose to as low as reasonably achievable. COMPARISON: CT head on 12/07/2023 CLINICAL HISTORY: Head trauma, abnormal mental status (Age 40-64y) FINDINGS: BRAIN AND VENTRICLES: No acute hemorrhage. No evidence of acute infarct. No hydrocephalus. No extra-axial collection. No mass effect or midline shift. ORBITS: No acute abnormality. SINUSES: No acute abnormality. SOFT TISSUES AND SKULL: No acute soft tissue abnormality. No skull fracture. IMPRESSION: 1. No acute intracranial abnormality. Electronically signed by: Gilmore Molt MD 10/17/2024 03:27 PM EST RP Workstation: [id number]   DG Chest Port 1 View Result Date: 10/17/2024 CLINICAL DATA:  Questionable sepsis. EXAM: PORTABLE CHEST 1 VIEW COMPARISON:  Chest radiograph dated 10/12/2024. FINDINGS: No focal consolidation pleural effusion or pneumothorax. The cardiac silhouette. No acute osseous pathology. IMPRESSION: No active disease. Electronically Signed   By: Vanetta Chou M.D.   On: 10/17/2024 14:09    Family Communication: Discussed with patient's wife, sister and niece at bedside. They understand and agree. All questions answered.  Disposition: Status is: Inpatient Remains inpatient appropriate because: AMS, encephalitis on acyclovir, ID and neuro eval.  Planned Discharge Destination: Home with Home Health and Rehab     Time spent: 52 minutes  Author: Concepcion Riser, MD 10/18/2024 3:06 PM Secure chat 7am to 7pm For on call review [email or web address].

## 2024-10-18 NOTE — Evaluation (Signed)
 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

## 2024-10-18 NOTE — Progress Notes (Signed)
 Patient received from [facility], V/S stable, pt will be on bedrest for 4 hours, will continue to monitor.    10/18/24 1349  Vitals  BP 121/73  MAP (mmHg) 87  BP Location Left Wrist  BP Method Manual  Patient Position (if appropriate) Lying  Pulse Rate 77  Pulse Rate Source Monitor  ECG Heart Rate 78  Resp 20  MEWS COLOR  MEWS Score Color Green  Oxygen  Therapy  SpO2 94 %  O2 Device Room Air  MEWS Score  MEWS Temp 0  MEWS Systolic 0  MEWS Pulse 0  MEWS RR 0  MEWS LOC 0  MEWS Score 0

## 2024-10-18 NOTE — Progress Notes (Addendum)
 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  Physical Exam   Constitutional: Appears well-developed and well-nourished.  Neurologic Examination    MS: He awakens to voice, he is able to tell me his name, he is currently getting an In-N-Out cath and is quite distracted by this, but he does follow commands. CN: Visual fields are full, EOMI Motor: He is able to lift extremities against gravity Sensory: Endorses symmetric sensation  Medications  Current Facility-Administered Medications:    0.9 %  sodium chloride  infusion, , Intravenous, Continuous, [name], [name], MD, Last Rate: 125 mL/hr at 10/18/24 1634, New Bag at 10/18/24 1634   acetaminophen  (TYLENOL ) tablet 650 mg, 650 mg, Oral, Q6H PRN **OR** acetaminophen  (TYLENOL ) suppository 650 mg, 650 mg, Rectal, Q6H PRN, [name], [name], MD   acyclovir (ZOVIRAX) 800 mg in dextrose  5 % 250 mL IVPB, 800 mg, Intravenous, Q8H, [name], [name], MD, Last Rate: 266 mL/hr at 10/18/24 1515, 800 mg at 10/18/24 1515   Chlorhexidine  Gluconate Cloth 2 % PADS 6 each, 6 each, Topical, Daily, [name], [name], MD, 6 each at 10/17/24 2320   ondansetron  (ZOFRAN ) tablet 4 mg, 4 mg, Oral, Q6H PRN **OR** ondansetron  (ZOFRAN ) injection 4 mg, 4 mg, Intravenous, Q6H PRN, [name], [name], MD   pantoprazole  (PROTONIX ) injection 40 mg, 40 mg, Intravenous, Q24H, [name], [name], MD, 40 mg at 10/18/24 1734   polyethylene glycol (MIRALAX  / GLYCOLAX )  packet 17 g, 17 g, Oral, Daily PRN, [name], [name], MD  Labs and Diagnostic Imaging   CSF WBC 60 CSF RBC 3200(suspect mildly traumatic given colorless supernatant) CSF protein 92 CSF glucose 55 CSF meningitis panel is negative  Imaging(Personally reviewed): MRI brain-nonenhancing T2 signal in the insula and temporal lobe  Assessment   RIYANSH GERSTNER is a 63 y.o. male presenting with fever, altered mental status, with abnormal MRI concerning for encephalitis.  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  He has already been started on acyclovir.  I discussed this with the patient's wife and sister.  I discussed that with this particular infection, it is possible that he could worsen though with starting antivirals, my hope is that he would stabilize quickly.  I confirmed that he is indeed full code.  Recommendations  No need for continued bacterial coverage Continue acyclovir for at least 14-21 days.  Neurology will follow ______________________________________________________________________   Signed, Aisha Seals, MD [facility]

## 2024-10-18 NOTE — TOC Initial Note (Signed)
 Transition of Care [facility]) - Initial/Assessment Note    Patient Details  Name: [name] MRN: [id number] Date of Birth: [birth year]  Transition of [facility]) CM/SW Contact:    Luise JAYSON Pan, LCSWA Phone Number: 10/18/2024, 1:24 PM  Clinical Narrative:     Patient currently disoriented x4. CSW spoke with patient spouse, who is at bedside.   [name] (spouse) confirmed patients address and their phone numbers. Per [name], patient and herself live together and she can provide transportation at discharge. Patient has PCP and insurance on file. [name] reports no HH hx. [name] reports patient has the following DME: nebulizer, a cane and RW (both from patients father). Per [name], patient uses drug store pharamcy on [address] in [place].   Expected Discharge Plan: Home/Self Care Barriers to Discharge: Continued Medical Work up   Patient Goals and CMS Choice            Expected Discharge Plan and Services In-house Referral: Clinical Social Work Discharge Planning Services: CM Consult   Living arrangements for the past 2 months: Single Family Home                                      Prior Living Arrangements/Services Living arrangements for the past 2 months: Single Family Home Lives with:: Spouse Patient language and need for interpreter reviewed:: Yes Do you feel safe going back to the place where you live?: Yes      Need for Family Participation in Patient Care: No (Comment) Care giver support system in place?: No (comment) Current home services: DME (Nebulizer, has a can, RW) Criminal Activity/Legal Involvement Pertinent to Current Situation/Hospitalization: No - Comment as needed  Activities of Daily Living   ADL Screening (condition at time of admission) Independently performs ADLs?: No Does the patient have a NEW difficulty with bathing/dressing/toileting/self-feeding that is expected to last >3 days?: Yes (Initiates electronic notice to provider for  possible OT consult) Does the patient have a NEW difficulty with getting in/out of bed, walking, or climbing stairs that is expected to last >3 days?: Yes (Initiates electronic notice to provider for possible PT consult) Does the patient have a NEW difficulty with communication that is expected to last >3 days?: Yes (Initiates electronic notice to provider for possible SLP consult) Is the patient deaf or have difficulty hearing?: No Does the patient have difficulty seeing, even when wearing glasses/contacts?: No Does the patient have difficulty concentrating, remembering, or making decisions?: Yes  Permission Sought/Granted Permission sought to share information with : Family Supports Permission granted to share information with : Yes, Verbal Permission Granted  Share Information with NAME: [name],[name]     Permission granted to share info w Relationship: Spouse  Permission granted to share info w Contact Information: [phone]  Emotional Assessment Appearance:: Appears stated age Attitude/Demeanor/Rapport: Unable to Assess Affect (typically observed): Unable to Assess Orientation: :  (Disoriented x4) Alcohol / Substance Use: Not Applicable Psych Involvement: No (comment)  Admission diagnosis:  SIRS (systemic inflammatory response syndrome) (HCC) [R65.10] Acute respiratory failure with hypoxia (HCC) [J96.01] Acute encephalopathy [G93.40] Sepsis, due to unspecified organism, unspecified whether acute organ dysfunction present [facility]) [A41.9] Patient Active Problem List   Diagnosis Date Noted   Acute encephalopathy 10/17/2024   Severe sepsis (HCC) 10/17/2024   Hyponatremia 10/17/2024   Acute hypoxic respiratory failure (HCC) 10/17/2024   Anxiety 03/18/2023   Stress reaction 03/18/2023   Sleep  difficulties 03/18/2023   Mixed hyperlipidemia 06/17/2021   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 [place] [address] Phone: [phone] Fax: [phone]     Social Drivers of Health (SDOH) Social History: SDOH Screenings   Food Insecurity: No Food Insecurity (10/17/2024)  Housing: Low Risk  (10/17/2024)  Transportation Needs: No Transportation Needs (10/17/2024)  Utilities: Not At Risk (10/17/2024)  Alcohol Screen: Low Risk  (09/27/2024)  Depression (PHQ2-9): Low Risk  (06/13/2024)  Financial Resource Strain: Low Risk  (09/27/2024)  Physical Activity: Insufficiently Active (09/27/2024)  Social Connections: Moderately Isolated (09/27/2024)  Stress: No Stress Concern Present (09/27/2024)  Tobacco Use: Low Risk  (10/17/2024)   SDOH Interventions:     Readmission Risk Interventions     No data to display

## 2024-10-18 NOTE — Progress Notes (Addendum)
 Initial Nutrition Assessment  DOCUMENTATION CODES:   Not applicable  INTERVENTION:  Advance diet as tolerated to regular diet. Ensure Plus High Protein PO BID if meal intake <50%. Each supplement provides 350 Kcals and 20 grams of protein.  NUTRITION DIAGNOSIS:   Swallowing difficulty related to acute illness (encephalopathy) as evidenced by NPO status.   GOAL:   Patient will meet greater than or equal to 90% of their needs   MONITOR:   Diet advancement, PO intake, Labs, Weight trends  REASON FOR ASSESSMENT:   Malnutrition Screening Tool    ASSESSMENT:   Patient presented with weakness s/p fall, nausea/vomiting/diarrhea, persistent headache and was found to have acute encephalopathy likely encephalitis/meningitis with severe sepsis and respiratory failure. PMH significant for SBO, GERD, and arthritis.  SLP was unable to eval today due to lethargy. Visited the patient who was very drowsy. He has multiple family members at bedside. His wife [name] tells me that the patient has been trying to eat healthier and had lost a little weight recently and now has a UBW of about 175 lbs. He had been eating very well up until the morning of admission. He is typically very active. The patient works the night shift and [name] states he is usually very sleepy all day up until around noon. She felt this information might be significant to SLP when they re-attempt eval so I messaged SLP. She is amenable to feeding tube placement and enteral nutrition if he is unable to swallow.  Scheduled Meds:  Chlorhexidine  Gluconate Cloth  6 each Topical Daily   dexamethasone  (DECADRON ) injection  10 mg Intravenous Q6H   Continuous Infusions:  sodium chloride  125 mL/hr at 10/18/24 0830   acyclovir 800 mg (10/18/24 0609)   ampicillin (OMNIPEN) IV 2 g (10/18/24 1351)   cefTRIAXone  (ROCEPHIN )  IV 2 g (10/18/24 0832)   vancomycin 750 mg (10/18/24 1120)    Diet Order             Diet NPO time specified   Diet effective now                  Meal Intake: N/A  Labs:     Latest Ref Rng & Units 10/18/2024    6:58 AM 10/17/2024    1:29 PM 10/06/2024    2:03 PM  CMP  Glucose 70 - 99 mg/dL 859  893  892   BUN 8 - 23 mg/dL 16  25  20    Creatinine 0.61 - 1.24 mg/dL 9.04  8.81  8.96   Sodium 135 - 145 mmol/L 128  130  137   Potassium 3.5 - 5.1 mmol/L 3.5  3.7  4.3   Chloride 98 - 111 mmol/L 98  95  98   CO2 22 - 32 mmol/L 19  20  28    Calcium  8.9 - 10.3 mg/dL 7.9  8.7  9.0   Total Protein 6.5 - 8.1 g/dL  7.0  7.3   Total Bilirubin 0.0 - 1.2 mg/dL  1.2  0.8   Alkaline Phos 38 - 126 U/L  43  70   AST 15 - 41 U/L  38  15   ALT 0 - 44 U/L  37  13      I/O: +2.6 L since admit  NUTRITION - FOCUSED PHYSICAL EXAM:  Flowsheet Row Most Recent Value  Orbital Region No depletion  Upper Arm Region No depletion  Thoracic and Lumbar Region No depletion  Buccal Region No depletion  [place]  Region Mild depletion  Clavicle Bone Region No depletion  Clavicle and Acromion Bone Region No depletion  Scapular Bone Region No depletion  Dorsal Hand No depletion  Patellar Region No depletion  Anterior Thigh Region No depletion  Posterior Calf Region No depletion  Edema (RD Assessment) None  Hair Reviewed  Eyes Reviewed  Mouth Reviewed  Skin Reviewed  Nails Reviewed    EDUCATION NEEDS:   No education needs have been identified at this time  Skin:  Skin Assessment: Reviewed RN Assessment  Last BM:  11/10  Height:   Ht Readings from Last 1 Encounters:  10/17/24 5' 5 (1.651 m)    Weight:    Weight Change: 5 Kg (6%) INTENTIONAL loss in 1 month   Usual Body Weight: 175 lbs  Edema: none  Ideal Body Weight:  62 kg   BMI:  Body mass index is 29.62 kg/m.  Estimated Nutritional Needs:  Kcal:  1900-2200 Protein:  90-115 Fluid:  >1900    Nathan Ruth, MS, RDN, LDN [place]. [facility] See AMION for contact information

## 2024-10-18 NOTE — Plan of Care (Signed)
   Problem: Education: Goal: Knowledge of General Education information will improve Description: Including pain rating scale, medication(s)/side effects and non-pharmacologic comfort measures Outcome: Progressing   Problem: Clinical Measurements: Goal: Will remain free from infection Outcome: Progressing   Problem: Clinical Measurements: Goal: Diagnostic test results will improve Outcome: Progressing

## 2024-10-18 NOTE — Progress Notes (Signed)
 IV team consulted for a second PIV. Upon assessment of BUE with ultrasound, no veins were seen that were suitable for PIV. Veins were non compressible and/or too deep for PIV. RN notified.  If patient needs additional access, would recommend central line or PICC.

## 2024-10-18 NOTE — Progress Notes (Signed)
 Patient currently getting LP will attempt later when schedule permits.

## 2024-10-18 NOTE — Progress Notes (Addendum)
 Patient remained sleepy ,responds with voice, disoriented*4, was able to speak few words, denies any pain/discomfort. V/S stable. IVF continue, waiting for PICC line insertion. He was retaining urine so in/out cath done, drained 400 ml. Family members at bedside, plan of care updated, will continue to monitor.

## 2024-10-18 NOTE — Progress Notes (Signed)
 Routine EEG complete. Results pending.

## 2024-10-19 ENCOUNTER — Inpatient Hospital Stay (HOSPITAL_COMMUNITY)

## 2024-10-19 DIAGNOSIS — A419 Sepsis, unspecified organism: Secondary | ICD-10-CM | POA: Diagnosis not present

## 2024-10-19 DIAGNOSIS — B004 Herpesviral encephalitis: Secondary | ICD-10-CM

## 2024-10-19 DIAGNOSIS — E8729 Other acidosis: Secondary | ICD-10-CM

## 2024-10-19 DIAGNOSIS — J9601 Acute respiratory failure with hypoxia: Secondary | ICD-10-CM | POA: Diagnosis not present

## 2024-10-19 DIAGNOSIS — G9340 Encephalopathy, unspecified: Secondary | ICD-10-CM | POA: Diagnosis not present

## 2024-10-19 DIAGNOSIS — R6520 Severe sepsis without septic shock: Secondary | ICD-10-CM | POA: Diagnosis not present

## 2024-10-19 DIAGNOSIS — B1009 Other human herpesvirus encephalitis: Secondary | ICD-10-CM | POA: Diagnosis not present

## 2024-10-19 DIAGNOSIS — R4182 Altered mental status, unspecified: Secondary | ICD-10-CM | POA: Diagnosis not present

## 2024-10-19 LAB — GLUCOSE, CAPILLARY
Glucose-Capillary: 102 mg/dL — ABNORMAL HIGH (ref 70–99)
Glucose-Capillary: 102 mg/dL — ABNORMAL HIGH (ref 70–99)
Glucose-Capillary: 104 mg/dL — ABNORMAL HIGH (ref 70–99)
Glucose-Capillary: 110 mg/dL — ABNORMAL HIGH (ref 70–99)
Glucose-Capillary: 97 mg/dL (ref 70–99)

## 2024-10-19 LAB — URINE CULTURE: Culture: NO GROWTH

## 2024-10-19 LAB — BASIC METABOLIC PANEL WITH GFR
Anion gap: 10 (ref 5–15)
BUN: 15 mg/dL (ref 8–23)
CO2: 24 mmol/L (ref 22–32)
Calcium: 7.9 mg/dL — ABNORMAL LOW (ref 8.9–10.3)
Chloride: 101 mmol/L (ref 98–111)
Creatinine, Ser: 0.93 mg/dL (ref 0.61–1.24)
GFR, Estimated: >60 mL/min (ref >60)
Glucose, Bld: 106 mg/dL — ABNORMAL HIGH (ref 70–99)
Potassium: 3.2 mmol/L — ABNORMAL LOW (ref 3.5–5.1)
Sodium: 135 mmol/L (ref 135–145)

## 2024-10-19 LAB — POCT I-STAT 7, (LYTES, BLD GAS, ICA,H+H)
Acid-base deficit: 2 mmol/L (ref 0.0–2.0)
Bicarbonate: 20.9 mmol/L (ref 20.0–28.0)
Calcium, Ion: 1.14 mmol/L — ABNORMAL LOW (ref 1.15–1.40)
HCT: 34 % — ABNORMAL LOW (ref 39.0–52.0)
Hemoglobin: 11.6 g/dL — ABNORMAL LOW (ref 13.0–17.0)
O2 Saturation: 100 %
Potassium: 3.4 mmol/L — ABNORMAL LOW (ref 3.5–5.1)
Sodium: 135 mmol/L (ref 135–145)
TCO2: 22 mmol/L (ref 22–32)
pCO2 arterial: 30 mmHg — ABNORMAL LOW (ref 32–48)
pH, Arterial: 7.451 — ABNORMAL HIGH (ref 7.35–7.45)
pO2, Arterial: 500 mmHg — ABNORMAL HIGH (ref 83–108)

## 2024-10-19 LAB — PATHOLOGIST SMEAR REVIEW

## 2024-10-19 LAB — CBC
HCT: 36.3 % — ABNORMAL LOW (ref 39.0–52.0)
Hemoglobin: 13.2 g/dL (ref 13.0–17.0)
MCH: 33.2 pg (ref 26.0–34.0)
MCHC: 36.4 g/dL — ABNORMAL HIGH (ref 30.0–36.0)
MCV: 91.2 fL (ref 80.0–100.0)
Platelets: 171 K/uL (ref 150–400)
RBC: 3.98 MIL/uL — ABNORMAL LOW (ref 4.22–5.81)
RDW: 13.2 % (ref 11.5–15.5)
WBC: 7.3 K/uL (ref 4.0–10.5)
nRBC: 0 % (ref 0.0–0.2)

## 2024-10-19 LAB — LACTIC ACID, PLASMA: Lactic Acid, Venous: 1 mmol/L (ref 0.5–1.9)

## 2024-10-19 LAB — VDRL, CSF: VDRL Quant, CSF: NONREACTIVE

## 2024-10-19 LAB — MRSA NEXT GEN BY PCR, NASAL: MRSA by PCR Next Gen: NOT DETECTED

## 2024-10-19 MED ORDER — SUCCINYLCHOLINE CHLORIDE 200 MG/10ML IV SOSY
PREFILLED_SYRINGE | INTRAVENOUS | Status: AC
  Filled 2024-10-19: qty 10

## 2024-10-19 MED ORDER — POTASSIUM CHLORIDE 20 MEQ PO PACK
20.0000 meq | PACK | ORAL | Status: AC
  Administered 2024-10-19 – 2024-10-20 (×2): 20 meq
  Filled 2024-10-19 (×2): qty 1

## 2024-10-19 MED ORDER — KETAMINE HCL 50 MG/5ML IJ SOSY
PREFILLED_SYRINGE | INTRAMUSCULAR | Status: AC
  Filled 2024-10-19: qty 10

## 2024-10-19 MED ORDER — NOREPINEPHRINE 4 MG/250ML-% IV SOLN
0.0000 ug/min | INTRAVENOUS | Status: DC
  Administered 2024-10-20: 6 ug/min via INTRAVENOUS
  Administered 2024-10-21: 3 ug/min via INTRAVENOUS
  Administered 2024-10-23: 1 ug/min via INTRAVENOUS
  Filled 2024-10-19 (×3): qty 250

## 2024-10-19 MED ORDER — MIDAZOLAM HCL 2 MG/2ML IJ SOLN
INTRAMUSCULAR | Status: AC
  Administered 2024-10-19: 4 mg
  Filled 2024-10-19: qty 2

## 2024-10-19 MED ORDER — FENTANYL BOLUS VIA INFUSION
25.0000 ug | INTRAVENOUS | Status: DC | PRN
  Administered 2024-10-23: 100 ug via INTRAVENOUS

## 2024-10-19 MED ORDER — LEVETIRACETAM (KEPPRA) 500 MG/5 ML ADULT IV PUSH
500.0000 mg | Freq: Two times a day (BID) | INTRAVENOUS | Status: DC
  Administered 2024-10-19 – 2024-10-21 (×4): 500 mg via INTRAVENOUS
  Filled 2024-10-19 (×5): qty 5

## 2024-10-19 MED ORDER — ETOMIDATE 2 MG/ML IV SOLN
INTRAVENOUS | Status: AC
Start: 2024-10-19 — End: 2024-10-19
  Administered 2024-10-19: 20 mg
  Filled 2024-10-19: qty 20

## 2024-10-19 MED ORDER — ORAL CARE MOUTH RINSE: 15.0000 mL | OROMUCOSAL | Status: DC | PRN

## 2024-10-19 MED ORDER — SODIUM CHLORIDE 0.9 % IV SOLN: INTRAVENOUS | Status: AC | PRN

## 2024-10-19 MED ORDER — ORAL CARE MOUTH RINSE
15.0000 mL | OROMUCOSAL | Status: DC
  Administered 2024-10-19 – 2024-10-23 (×47): 15 mL via OROMUCOSAL

## 2024-10-19 MED ORDER — DOCUSATE SODIUM 50 MG/5ML PO LIQD
100.0000 mg | Freq: Two times a day (BID) | ORAL | Status: DC
  Administered 2024-10-19 – 2024-10-22 (×7): 100 mg
  Filled 2024-10-19 (×7): qty 10

## 2024-10-19 MED ORDER — MIDAZOLAM HCL (PF) 2 MG/2ML IJ SOLN: 1.0000 mg | INTRAMUSCULAR | Status: DC | PRN

## 2024-10-19 MED ORDER — LORAZEPAM 2 MG/ML IJ SOLN
1.0000 mg | INTRAMUSCULAR | Status: AC
  Administered 2024-10-19: 1 mg via INTRAVENOUS

## 2024-10-19 MED ORDER — POLYETHYLENE GLYCOL 3350 17 G PO PACK
17.0000 g | PACK | Freq: Every day | ORAL | Status: DC
  Administered 2024-10-19 – 2024-10-22 (×4): 17 g
  Filled 2024-10-19 (×4): qty 1

## 2024-10-19 MED ORDER — MIDAZOLAM HCL 2 MG/2ML IJ SOLN
INTRAMUSCULAR | Status: AC
Start: 2024-10-19 — End: 2024-10-19
  Filled 2024-10-19: qty 2

## 2024-10-19 MED ORDER — OSMOLITE 1.2 CAL PO LIQD
1000.0000 mL | ORAL | Status: DC
  Administered 2024-10-19: 1000 mL
  Filled 2024-10-19 (×4): qty 1000

## 2024-10-19 MED ORDER — FENTANYL CITRATE (PF) 50 MCG/ML IJ SOSY: 25.0000 ug | PREFILLED_SYRINGE | Freq: Once | INTRAMUSCULAR | Status: AC

## 2024-10-19 MED ORDER — FENTANYL CITRATE (PF) 50 MCG/ML IJ SOSY
PREFILLED_SYRINGE | INTRAMUSCULAR | Status: AC
  Administered 2024-10-19: 100 ug
  Filled 2024-10-19: qty 2

## 2024-10-19 MED ORDER — PROSOURCE TF20 ENFIT COMPATIBL EN LIQD: 60.0000 mL | Freq: Every day | ENTERAL | Status: DC

## 2024-10-19 MED ORDER — LEVETIRACETAM (KEPPRA) 500 MG/5 ML ADULT IV PUSH
2500.0000 mg | Freq: Once | INTRAVENOUS | Status: AC
  Administered 2024-10-19: 2500 mg via INTRAVENOUS
  Filled 2024-10-19: qty 25

## 2024-10-19 MED ORDER — VITAL AF 1.2 CAL PO LIQD
1000.0000 mL | ORAL | Status: DC
  Administered 2024-10-19 – 2024-10-23 (×5): 1000 mL

## 2024-10-19 MED ORDER — LORAZEPAM 2 MG/ML IJ SOLN
INTRAMUSCULAR | Status: AC
  Filled 2024-10-19: qty 1

## 2024-10-19 MED ORDER — DEXMEDETOMIDINE HCL IN NACL 400 MCG/100ML IV SOLN
0.0000 ug/kg/h | INTRAVENOUS | Status: DC
  Administered 2024-10-19: 0.4 ug/kg/h via INTRAVENOUS
  Administered 2024-10-19 – 2024-10-22 (×6): 0.5 ug/kg/h via INTRAVENOUS
  Filled 2024-10-19 (×7): qty 100

## 2024-10-19 MED ORDER — NOREPINEPHRINE 4 MG/250ML-% IV SOLN
INTRAVENOUS | Status: AC
  Filled 2024-10-19: qty 250

## 2024-10-19 MED ORDER — ROCURONIUM BROMIDE 10 MG/ML (PF) SYRINGE
PREFILLED_SYRINGE | INTRAVENOUS | Status: AC
  Administered 2024-10-19: 60 mg
  Filled 2024-10-19: qty 10

## 2024-10-19 MED ORDER — POTASSIUM CHLORIDE 10 MEQ/50ML IV SOLN
10.0000 meq | INTRAVENOUS | Status: AC
  Administered 2024-10-19 – 2024-10-20 (×4): 10 meq via INTRAVENOUS
  Filled 2024-10-19 (×4): qty 50

## 2024-10-19 MED ORDER — FENTANYL 2500MCG IN NS 250ML (10MCG/ML) PREMIX INFUSION
0.0000 ug/h | INTRAVENOUS | Status: DC
  Administered 2024-10-19: 25 ug/h via INTRAVENOUS
  Administered 2024-10-21: 50 ug/h via INTRAVENOUS
  Filled 2024-10-19 (×2): qty 250

## 2024-10-19 MED ORDER — LACTATED RINGERS IV SOLN: INTRAVENOUS | Status: AC

## 2024-10-19 NOTE — Progress Notes (Signed)
 eLink Physician-Brief Progress Note Patient Name: [name] DOB: 08-23-1961 MRN: [id number]   Date of Service  10/19/2024  HPI/Events of Note  K+ 3.2  eICU Interventions  K+ replaced per electrolyte protocol.        [name] U [name] 10/19/2024, 10:51 PM

## 2024-10-19 NOTE — Progress Notes (Signed)
 LTM maint complete - no skin breakdown seen Patient moved to 51m from 3E [facility] monitored, Event button test confirmed by [facility].

## 2024-10-19 NOTE — Procedures (Signed)
 Cortrak  Person Inserting Tube:  Mady Dolly, RD Tube Type:  Cortrak - 43 inches Tube Size:  10 Tube Location:  Left nare Secured by: Bridle Initial Placement:  Gastric Technique Used to Measure Tube Placement:  Marking at nare/corner of mouth Cortrak Secured At:  68 cm   Cortrak Tube Team Note:  Consult received to place a Cortrak feeding tube.   No x-ray is required. RN may begin using tube.   If the tube becomes dislodged please keep the tube and contact the Cortrak team at [email or web address] for replacement.  If after hours and replacement cannot be delayed, place a NG tube and confirm placement with an abdominal x-ray.    Dolly Mady MS, RD, LDN Registered Dietitian Clinical Nutrition RD Inpatient Contact Info in Amion

## 2024-10-19 NOTE — Progress Notes (Signed)
 OT Cancellation Note  Patient Details Name: [name] MRN: [id number] DOB: 02-May-1961   Cancelled Treatment:    Reason Eval/Treat Not Completed: Patient not medically ready (Per RN, pt not appropriate for acute OT eval at this time due to pt currently unable to follow 1-step commands, responding to RN only intermittently, and currently receiving EEG. OT to reattempt to see pt at a later time as appropriate/available.)  Margarie Rockey HERO., OTR/L, MA Acute Rehab [phone]   [name] 10/19/2024, 9:52 AM

## 2024-10-19 NOTE — Progress Notes (Signed)
 SLP Cancellation Note  Patient Details Name: [name] MRN: [id number] DOB: [birth year]   Cancelled evaluation: poorly responsive, will continue efforts to complete speech/language evaluation.  [name] L. Vona, MA CCC/SLP Clinical Specialist - Acute Care SLP Acute Rehabilitation Services Office number [phone]            [name] 10/19/2024, 11:42 AM

## 2024-10-19 NOTE — Progress Notes (Signed)
 STAT LTM recording with CT compatible leads. Patient is being monitored by [facility]. Test button tested and explained to wife.

## 2024-10-19 NOTE — Progress Notes (Signed)
 Progress Note   Patient: [name] FMW:[id number] DOB: 10-25-1961 DOA: 10/17/2024     2 DOS: the patient was seen and examined on 10/19/2024   Brief hospital course: [name] is a 63 y.o. male with medical history significant for small bowel obstruction, GERD presented to the ED with reports of generalized weakness, and a fall today.  Family reports he has been sick for 3 weeks, symptoms started with congestion, cough, headaches, they report nausea vomiting and diarrhea.  He has had persistent headaches also. No neck pain. He was seen as outpatient, prescribed course of doxycycline -started 11/7, spouse reports he was compliant with it, but symptoms persisted.  Spouse and grand niece at bedside reports that patient has not been responding to them, and from 4 PM while here in the ED, that his mental status has worsened, he did not recognize them, they report that he was unable to say their names or talk.   MRI head Abnormal diffusion and FLAIR signal abnormality involving the left insular cortex and inferior left frontal lobe, as well as the mesial left temporal lobe. Findings are nonspecific, but could reflect sequelae of acute encephalitis, which could be either infectious or inflammatory in nature (HSV encephalitis should be considered until proven otherwise).   Patient is transferred to [facility] facility for neurology evaluation and further management.  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 Meningitis/ encephalitis panel positive for HSV 1. EEG showed cortical dysfunction arising from left hemisphere likely secondary to underlying structural abnormality. MRI brain with contrast - Increased T2 signal within the left mesial temporal lobe, without abnormal enhancement. He had an episode of seizures, started on Keppra therapy. LTM EEG ordered per neurology. Continue Acyclovir therapy per  pharmacy protocol with IV hydration. Stopped IV steroids, broad spectrum antibiotics. ID consulted. Continue droplet precautions. Discussed with family at bedside regarding poor prognosis, need for PCCM evaluation for airway management given his obtundation.  Severe sepsis- He presented with fever, tachycardia, tachypnea, hypoxia, altered mental status. Lactic acid 2.2. Continue IV fluids 125ml/hr. Continue broad spectrum antibiotics, Acyclovir. Monitor vitals closely.  Await final blood cultures before stopping vanc, rocephin  and ampicillin.  Acute hypoxic respiratory failure He presented with hypoxia, altered, Po2 58 on blood gas. Continue supplemental oxygen  to maintain saturation above 92%. PCCM evaluation called as he may deteriorate per neurology recommendations this morning. Transfer to ICU for close air monitoring.  Hyponatremia- Due to hypovolemia. Gentle IV hydration. Trend Na.   GERD continue IV PPI.    PT/ OT once more awake. Nursing supportive care. Fall, aspiration precautions. Diet:  Diet Orders (From admission, onward)     Start     Ordered   10/17/24 1308  Diet NPO time specified  (Septic presentation on arrival (screening labs, nursing and treatment orders for obvious sepsis))  Diet effective now        10/17/24 1308           DVT prophylaxis: SCDs Start: 10/17/24 2340  Level of care: ICU   Code Status: Full Code  Subjective: Patient is seen and examined today morning. He is more sleepy and lethargic. Opens eyes with painful stimuli. Family at bedside concerned.  Physical Exam: Vitals:   10/19/24 0458 10/19/24 0730 10/19/24 1158 10/19/24 1247  BP: 105/69 (!) 126/94 113/69   Pulse: 75 74 74   Resp: 20 (!) 23 (!) 27   Temp: 100.3 F (37.9 C) 99.5  F (37.5 C) 99.6 F (37.6 C) 98.9 F (37.2 C)  TempSrc: Axillary Oral Axillary Axillary  SpO2: 96% 90% 90%   Weight:      Height:        General - Elderly Caucasian male, lethargic, no apparent  distress HEENT - PERRLA, EOMI, atraumatic head, non tender sinuses. Lung - Clear, basal rales, no rhonchi, wheezes. Heart - S1, S2 heard, no murmurs, rubs, trace pedal edema. Abdomen - Soft, non tender, bowel sounds good Neuro - sleepy and lethargic, unable to do full neuro exam. Skin - Warm and dry.  Data Reviewed:      Latest Ref Rng & Units 10/18/2024    6:58 AM 10/17/2024    1:29 PM 10/06/2024    2:03 PM  CBC  WBC 4.0 - 10.5 K/uL 7.4  13.1  12.9   Hemoglobin 13.0 - 17.0 g/dL 87.4  84.9  83.6   Hematocrit 39.0 - 52.0 % 33.9  40.4  47.6   Platelets 150 - 400 K/uL 153  215  267       Latest Ref Rng & Units 10/18/2024    5:52 PM 10/18/2024    6:58 AM 10/17/2024    1:29 PM  BMP  Glucose 70 - 99 mg/dL 92  859  893   BUN 8 - 23 mg/dL 15  16  25    Creatinine 0.61 - 1.24 mg/dL 8.99  9.04  8.81   Sodium 135 - 145 mmol/L 132  128  130   Potassium 3.5 - 5.1 mmol/L 3.7  3.5  3.7   Chloride 98 - 111 mmol/L 101  98  95   CO2 22 - 32 mmol/L 20  19  20    Calcium  8.9 - 10.3 mg/dL 7.8  7.9  8.7    DG FL GUIDED LUMBAR PUNCTURE Result Date: 10/18/2024 CLINICAL DATA:  [id number] Meningitis [id number] Herpes encephalitis 695 63 year old male with recent history of cough, congestion, weakness now presents with acute encephalopathy. Lumbar puncture requested. EXAM: LUMBAR PUNCTURE UNDER FLUOROSCOPY PROCEDURE: An appropriate skin entry site was determined fluoroscopically. Operator donned sterile gloves and mask. Skin site was marked, then prepped with Betadine , draped in usual sterile fashion, and infiltrated locally with 1% lidocaine . A 20 gauge spinal needle advanced into the thecal sac at L4-L5 from a LEFT interlaminar approach. Cloudy pink CSF spontaneously returned, with opening pressure of 22.5 cm water . 14 ml CSF were collected and divided among 4 sterile vials for the requested laboratory studies. The needle was then removed. The patient tolerated the procedure well and there were no  complications. FLUOROSCOPY: Radiation Exposure Index (as provided by the fluoroscopic device): 1.8 mGy Kerma IMPRESSION: Successful lumbar puncture under fluoroscopy. This exam was performed by Solmon Ku PA-C, and was supervised and interpreted by Thom Hall, MD. Electronically Signed   By: Thom Hall M.D.   On: 10/18/2024 16:59   US  EKG SITE RITE Result Date: 10/18/2024 If Site Rite image not attached, placement could not be confirmed due to current cardiac rhythm.  EEG adult Result Date: 10/18/2024 Shelton Arlin KIDD, MD     10/18/2024  2:43 PM Patient Name: MONTRAE BRAITHWAITE MRN: [id number] Epilepsy Attending: Arlin KIDD Shelton Referring Physician/Provider: Khaliqdina, Salman, MD Date: 10/18/2024 Duration: 23.06 mins Patient history: 63yo M with ams. EEG to evaluate for seizure Level of alertness: Awake AEDs during EEG study: None Technical aspects: This EEG study was done with scalp electrodes positioned according to the 10-20 International system of  electrode placement. Electrical activity was reviewed with band pass filter of 1-70Hz , sensitivity of 7 uV/mm, display speed of 73mm/sec with a 60Hz  notched filter applied as appropriate. EEG data were recorded continuously and digitally stored.  Video monitoring was available and reviewed as appropriate. Description: The posterior dominant rhythm consists of 8 Hz activity of moderate voltage (25-35 uV) seen predominantly in posterior head regions, asymmetric( left<right) and reactive to eye opening and eye closing. EEG showed continuous 3 to 6 Hz theta-delta slowing in left hemisphere admixed with intermittent generalized 3-5hz  theta-delta slowing.SABRA Hyperventilation and photic stimulation were not performed.   ABNORMALITY - Intermittent slow, generalized - Continuous slow, left hemisphere IMPRESSION: This study is suggestive of  suggestive of cortical dysfunction arising from left hemisphere likely secondary to underlying structural abnormality.  Additionally there is generalized cerebral dysfunction (encephalopathy). No seizures or epileptiform discharges were seen throughout the recording. Arlin MALVA Krebs   MR BRAIN W CONTRAST Result Date: 10/18/2024 EXAM: MRI BRAIN WITH CONTRAST 10/18/2024 03:26:00 AM TECHNIQUE: Multiplanar multisequence MRI of the head/brain was performed with the administration of intravenous contrast. CONTRAST: 8 mL Gadobutrol. COMPARISON: MR Head 10/17/2024. CLINICAL HISTORY: Encephalitis. FINDINGS: BRAIN AND VENTRICLES: No acute infarct. No acute intracranial hemorrhage. No mass effect or midline shift. No hydrocephalus. The sella is unremarkable. Normal flow voids. There is no abnormal parenchymal or meningeal enhancement present. There is increased T2 signal again demonstrated within the left mesial temporal lobe. ORBITS: No acute abnormality. SINUSES: No acute abnormality. BONES AND SOFT TISSUES: Normal bone marrow signal and enhancement. No acute soft tissue abnormality. IMPRESSION: 1. No acute intracranial abnormality. 2. Increased T2 signal within the left mesial temporal lobe, without abnormal enhancement. Clinical correlation and follow up to ensure resolution is recommended. Electronically signed by: Evalene Coho MD 10/18/2024 05:20 AM EST RP Workstation: [id number]   MR BRAIN WO CONTRAST Result Date: 10/17/2024 CLINICAL DATA:  Initial evaluation for acute altered mental status, fever. EXAM: MRI HEAD WITHOUT CONTRAST MRA HEAD WITHOUT CONTRAST TECHNIQUE: Multiplanar, multi-echo pulse sequences of the brain and surrounding structures were acquired without intravenous contrast. Angiographic images of the Circle of Willis were acquired using MRA technique without intravenous contrast. COMPARISON:  CT from earlier the same day. FINDINGS: MRI HEAD FINDINGS Brain: Cerebral volume within normal limits. Abnormal diffusion and FLAIR signal abnormality seen involving the left insular cortex and inferior left frontal lobe,  as well as the mesial left temporal lobe (series 5, images 25, 18). Gyral swelling and edema within this region without significant regional mass effect. Findings are nonspecific, but could reflect sequelae of acute encephalitis, including HSV encephalitis. Postictal changes or possibly infiltrating neoplasm would be the primary differential considerations. No other evidence for acute or subacute infarct. Gray-white matter differentiation otherwise maintained. No areas of chronic cortical infarction. No acute or chronic intracranial blood products. No mass lesion or midline shift. No hydrocephalus or extra-axial fluid collection. Pituitary gland within normal limits. Vascular: Major intracranial vascular flow voids are maintained. Skull and upper cervical spine: Craniocervical junction within normal limits. Mildly decreased T1 signal intensity within the visualized bone marrow, nonspecific, but most commonly related to anemia, smoking, or obesity. No scalp soft tissue abnormality. Sinuses/Orbits: Globes orbital soft tissues within normal limits. Paranasal sinuses are largely clear. No significant mastoid effusion. Other: None. MRA HEAD FINDINGS Anterior circulation: Both internal carotid arteries are patent through the siphons without stenosis or other abnormality. A1 segments patent bilaterally. Normal anterior communicating artery complex. Anterior cerebral arteries patent without stenosis. No M1 stenosis  or occlusion. No proximal MCA branch occlusion or high-grade stenosis. Distal MCA branches perfused and symmetric. Posterior circulation: Visualized V4 segments widely patent without stenosis. Neither PICA origin visualized. Basilar patent without stenosis. Superior cerebral arteries patent bilaterally. Predominant fetal type origin of the PCAs bilaterally. Both PCAs patent to their distal aspects without stenosis. Anatomic variants: As above.  No aneurysm. IMPRESSION: MRI HEAD: Abnormal diffusion and FLAIR  signal abnormality involving the left insular cortex and inferior left frontal lobe, as well as the mesial left temporal lobe. Findings are nonspecific, but could reflect sequelae of acute encephalitis, which could be either infectious or inflammatory in nature (HSV encephalitis should be considered until proven otherwise). Postictal changes or possibly infiltrating neoplasm would be the primary differential considerations. Correlation with CSF analysis and EEG suggested. MRA HEAD: Normal intracranial MRA. Electronically Signed   By: Morene Hoard M.D.   On: 10/17/2024 20:28   MR ANGIO HEAD WO CONTRAST Result Date: 10/17/2024 CLINICAL DATA:  Initial evaluation for acute altered mental status, fever. EXAM: MRI HEAD WITHOUT CONTRAST MRA HEAD WITHOUT CONTRAST TECHNIQUE: Multiplanar, multi-echo pulse sequences of the brain and surrounding structures were acquired without intravenous contrast. Angiographic images of the Circle of Willis were acquired using MRA technique without intravenous contrast. COMPARISON:  CT from earlier the same day. FINDINGS: MRI HEAD FINDINGS Brain: Cerebral volume within normal limits. Abnormal diffusion and FLAIR signal abnormality seen involving the left insular cortex and inferior left frontal lobe, as well as the mesial left temporal lobe (series 5, images 25, 18). Gyral swelling and edema within this region without significant regional mass effect. Findings are nonspecific, but could reflect sequelae of acute encephalitis, including HSV encephalitis. Postictal changes or possibly infiltrating neoplasm would be the primary differential considerations. No other evidence for acute or subacute infarct. Gray-white matter differentiation otherwise maintained. No areas of chronic cortical infarction. No acute or chronic intracranial blood products. No mass lesion or midline shift. No hydrocephalus or extra-axial fluid collection. Pituitary gland within normal limits. Vascular: Major  intracranial vascular flow voids are maintained. Skull and upper cervical spine: Craniocervical junction within normal limits. Mildly decreased T1 signal intensity within the visualized bone marrow, nonspecific, but most commonly related to anemia, smoking, or obesity. No scalp soft tissue abnormality. Sinuses/Orbits: Globes orbital soft tissues within normal limits. Paranasal sinuses are largely clear. No significant mastoid effusion. Other: None. MRA HEAD FINDINGS Anterior circulation: Both internal carotid arteries are patent through the siphons without stenosis or other abnormality. A1 segments patent bilaterally. Normal anterior communicating artery complex. Anterior cerebral arteries patent without stenosis. No M1 stenosis or occlusion. No proximal MCA branch occlusion or high-grade stenosis. Distal MCA branches perfused and symmetric. Posterior circulation: Visualized V4 segments widely patent without stenosis. Neither PICA origin visualized. Basilar patent without stenosis. Superior cerebral arteries patent bilaterally. Predominant fetal type origin of the PCAs bilaterally. Both PCAs patent to their distal aspects without stenosis. Anatomic variants: As above.  No aneurysm. IMPRESSION: MRI HEAD: Abnormal diffusion and FLAIR signal abnormality involving the left insular cortex and inferior left frontal lobe, as well as the mesial left temporal lobe. Findings are nonspecific, but could reflect sequelae of acute encephalitis, which could be either infectious or inflammatory in nature (HSV encephalitis should be considered until proven otherwise). Postictal changes or possibly infiltrating neoplasm would be the primary differential considerations. Correlation with CSF analysis and EEG suggested. MRA HEAD: Normal intracranial MRA. Electronically Signed   By: Morene Hoard M.D.   On: 10/17/2024 20:28  CT Angio Chest PE W/Cm &/Or Wo Cm Result Date: 10/17/2024 CLINICAL DATA:  Fall this morning. Ongoing  weakness. Shortness of breath. Possible pulmonary embolism. EXAM: CT ANGIOGRAPHY CHEST WITH CONTRAST TECHNIQUE: Multidetector CT imaging of the chest was performed using the standard protocol during bolus administration of intravenous contrast. Multiplanar CT image reconstructions and MIPs were obtained to evaluate the vascular anatomy. RADIATION DOSE REDUCTION: This exam was performed according to the departmental dose-optimization program which includes automated exposure control, adjustment of the mA and/or kV according to patient size and/or use of iterative reconstruction technique. CONTRAST:  75mL OMNIPAQUE  IOHEXOL  350 MG/ML SOLN COMPARISON:  10/06/2024 FINDINGS: Cardiovascular: Borderline stable cardiomegaly. Thoracic aorta is normal in caliber. Normal takeoff of the great vessels from the aortic arch. Pulmonary arterial system is well opacified without evidence of emboli. Remaining vascular structures are unremarkable. Mediastinum/Nodes: There is no mediastinal or hilar adenopathy. Remaining mediastinal structures are unremarkable. Lungs/Pleura: Lungs are adequately inflated. There is no acute airspace consolidation or effusion. Mild bibasilar dependent atelectasis. Airways are unremarkable. Upper Abdomen: No acute findings. Musculoskeletal: Mild anterior wedging of a vertebral body near the thoracolumbar junction unchanged. Review of the MIP images confirms the above findings. IMPRESSION: 1. No acute cardiopulmonary disease and no evidence of pulmonary embolism. 2. Borderline stable cardiomegaly. Electronically Signed   By: Toribio Agreste M.D.   On: 10/17/2024 16:34   CT Head Wo Contrast Result Date: 10/17/2024 EXAM: CT HEAD WITHOUT CONTRAST 10/17/2024 02:42:00 PM TECHNIQUE: CT of the head was performed without the administration of intravenous contrast. Automated exposure control, iterative reconstruction, and/or weight based adjustment of the mA/kV was utilized to reduce the radiation dose to as low as  reasonably achievable. COMPARISON: CT head on 12/07/2023 CLINICAL HISTORY: Head trauma, abnormal mental status (Age 15-64y) FINDINGS: BRAIN AND VENTRICLES: No acute hemorrhage. No evidence of acute infarct. No hydrocephalus. No extra-axial collection. No mass effect or midline shift. ORBITS: No acute abnormality. SINUSES: No acute abnormality. SOFT TISSUES AND SKULL: No acute soft tissue abnormality. No skull fracture. IMPRESSION: 1. No acute intracranial abnormality. Electronically signed by: Gilmore Molt MD 10/17/2024 03:27 PM EST RP Workstation: [id number]   DG Chest Port 1 View Result Date: 10/17/2024 CLINICAL DATA:  Questionable sepsis. EXAM: PORTABLE CHEST 1 VIEW COMPARISON:  Chest radiograph dated 10/12/2024. FINDINGS: No focal consolidation pleural effusion or pneumothorax. The cardiac silhouette. No acute osseous pathology. IMPRESSION: No active disease. Electronically Signed   By: Vanetta Chou M.D.   On: 10/17/2024 14:09    Family Communication: Discussed with patient's wife regarding poor prognosis. She understand and agree. All questions answered.  Disposition: Status is: Inpatient Remains inpatient appropriate because: AMS, septic, HSV encephalitis, ICU eval.  Planned Discharge Destination: Home with Home Health and Rehab     Time spent: 54 minutes  Author: Concepcion Riser, MD 10/19/2024 12:54 PM Secure chat 7am to 7pm For on call review [email or web address].

## 2024-10-19 NOTE — Consult Note (Signed)
 [facility]    Date of Admission:  10/17/2024    Reason for Consult: HSV-1 Encephalitis    Assessment and Plan: HSV-1 Encephalitis   The patient is a 63 year old male who presented with altered mentation. The patient had been feeling ill for the past 3 weeks with URI like symptoms. Given Doxycyline when seen outpatient. Seen in the ED with worsening mentation and MRI brain showed abnormal diffusion/FLAIR signal abnormality involved with the insular left cortex and inferior left frontal lobe and the mesial left temporal lobe. LP was performed. Meningitis/encephalitis panel showed HSV-1. 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 male with past medical history of arthritis, skin cancer of left arm, GERD, and small bowel obstruction in the past who presented to [facility], ED with complaints of generalized weakness after a fall on 10/17/2024. Per report, the patient had been not feeling well/sick for over 3 weeks with symptoms of an upper respiratory tract infection. He was apparently complaining of congestion, headaches, cough and some nausea/vomiting with diarrhea.  Patient was seen outpatient and prescribed Doxycycline  on 11/7 and was compliant but unfortunately symptoms remained. While in the ED, the patient had worsening mental status and therefore MRI of  head was obtained showing abnormal diffusion/FLAIR signal abnormality involved with the insular left cortex and inferior left frontal lobe and the mesial left temporal lobe.  Concerns for acute encephalitis were noted.  LP was completed with CSF WBC 60, RBCs 3200, protein 92, glucose 55, meningitis panel positive for HSV 1.  Patient was already receiving broad-spectrum and antibiotics as well as Acyclovir.  Unfortunately patient began developing seizure activity on 11/14, Neurology already following and recommended ICU transfer for airway protection as well as initiating AEDs-Ativan  1 mg given and Keppra was loaded. Patient intubated for airway protection. Infection Disease was consulted given his HSV encephalitis.   Review of Systems: Unable to be obtained. Intubated   Past Medical History:  Diagnosis Date   Allergy    Arthritis    Calcaneus fracture, left    Cancer (HCC)    Skin cancer Left Arm   GERD (gastroesophageal reflux disease)    Small bowel obstruction (HCC)    Social History   Tobacco Use   Smoking status: Never   Smokeless tobacco: Never  Vaping Use   Vaping status: Never Used  Substance Use Topics   Alcohol use: Yes    Alcohol/week: 4.0 standard drinks of alcohol    Types: 4 Cans of beer per week    Comment: occ   Drug use: No   Family History  Problem Relation Age of Onset   Stroke Mother    Cancer Mother        colon   Alzheimer's disease Father  85 years   Heart disease Father        atrial fibri   Heart disease Sister    Early death Brother    Cancer Maternal Grandmother    Allergies  Allergen Reactions   Morphine And Codeine Swelling   Bee Venom Rash   OBJECTIVE: Blood pressure 107/70, pulse 71, temperature 98.7 F (37.1 C), temperature source Axillary, resp. rate (!) 25, height 5' 5 (1.651 m), weight 80.7 kg, SpO2 99%.  General: Well developed, well nourish  in no apparent distress HENT: Moist mucous membranes, normal nose, normal  external ears, and normocephalic Neck: supple, trachea midline, and normal cervical range of motion Eyes: PERRL, EOMI, non-icteric, and normal conjunctivae and lids Lungs: Clear to auscultation bilaterally. No wheezing, rales or rhonchi Cardiac: Regular rate and rhythm. No murmurs, rubs or gallops. No peripheral edema Abdomen: Soft, Non distended, Non tender, active bowel sounds Skin: Intact, no focal erythema or rash, and warm and dry GU: Defered genital exam Musculoskeletal: No obvious skeletal abnormalities Neuro: Alert, no focal neurologic deficits, moves all extremities Psych: Oriented x 3, cooperative  Lab Results Lab Results  Component Value Date   WBC 7.4 10/18/2024   HGB 11.6 (L) 10/19/2024   HCT 34.0 (L) 10/19/2024   MCV 89.9 10/18/2024   PLT 153 10/18/2024    Lab Results  Component Value Date   CREATININE 1.00 10/18/2024   BUN 15 10/18/2024   NA 135 10/19/2024   K 3.4 (L) 10/19/2024   CL 101 10/18/2024   CO2 20 (L) 10/18/2024    Lab Results  Component Value Date   ALT 37 10/17/2024   AST 38 10/17/2024   ALKPHOS 43 10/17/2024   BILITOT 1.2 10/17/2024   Microbiology: Recent Results (from the past 240 hours)  Veritor SARS-CoV-2 and Flu A+B     Status: None   Collection Time: 10/12/24  9:31 AM   Specimen: Nasal Swab   Nasal Swab  Result Value Ref Range Status   Influenza A Negative Negative Final   Influenza B Negative Negative Final    Comment: If the test is negative for the presence of influenza A or influenza B antigen, infection due to influenza cannot be ruled-out because the antigen present in the sample may be below the detection limit of the test. It is recommended that these results be confirmed by viral culture or an FDA-cleared influenza A and B molecular assay.    BD Veritor SARS-CoV-2 Ag Negative Negative Final    Comment:              Testing was performed using BD Veritor(TM) SARS-CoV-2 This test has not been FDA cleared or approved. This  test has been authorized by FDA under an Emergency Use Authorization (EUA). This test is only authorized for the duration of the declaration that circumstances exist justifying the authorization of emergency use of in vitro diagnostics for detection and/or diagnosis of COVID-19 under Section 564(b)(1) of the Act, 21 U.S.C. 639aaa-6(a)(8), unless the authorization is terminated or revoked sooner. When diagnostic testing is negative, the possibility of a false negative result should be considered in the context of a patient's recent exposure and the presence of clinical signs and symptoms consistent with COVID-19. An individual without symptoms of COVID-19 and who is not shedding SARS-CoV-2 virus would expect to have a negative (not detected) result in this assay.   Resp panel by RT-PCR (RSV, Flu A&B, Covid) Anterior Nasal Swab     Status: None  Collection Time: 10/17/24 12:49 PM   Specimen: Anterior Nasal Swab  Result Value Ref Range Status   SARS Coronavirus 2 by RT PCR NEGATIVE NEGATIVE Final    Comment: (NOTE) SARS-CoV-2 target nucleic acids are NOT DETECTED.  The SARS-CoV-2 RNA is generally detectable in upper respiratory specimens during the acute phase of infection. The lowest concentration of SARS-CoV-2 viral copies this assay can detect is 138 copies/mL. A negative result does not preclude SARS-Cov-2 infection and should not be used as the sole basis for treatment or other patient management decisions. A negative result may occur with  improper specimen collection/handling, submission of specimen other than nasopharyngeal swab, presence of viral mutation(s) within the areas targeted by this assay, and inadequate number of viral copies(<138 copies/mL). A negative result must be combined with clinical observations, patient history, and epidemiological information. The expected result is Negative.  Fact Sheet for Patients:  bloggercourse.com  Fact  Sheet for Healthcare Providers:  seriousbroker.it  This test is no t yet approved or cleared by the [place]  FDA and  has been authorized for detection and/or diagnosis of SARS-CoV-2 by FDA under an Emergency Use Authorization (EUA). This EUA will remain  in effect (meaning this test can be used) for the duration of the COVID-19 declaration under Section 564(b)(1) of the Act, 21 U.S.C.section 360bbb-3(b)(1), unless the authorization is terminated  or revoked sooner.       Influenza A by PCR NEGATIVE NEGATIVE Final   Influenza B by PCR NEGATIVE NEGATIVE Final    Comment: (NOTE) The Xpert Xpress SARS-CoV-2/FLU/RSV plus assay is intended as an aid in the diagnosis of influenza from Nasopharyngeal swab specimens and should not be used as a sole basis for treatment. Nasal washings and aspirates are unacceptable for Xpert Xpress SARS-CoV-2/FLU/RSV testing.  Fact Sheet for Patients: bloggercourse.com  Fact Sheet for Healthcare Providers: seriousbroker.it  This test is not yet approved or cleared by the [place]  FDA and has been authorized for detection and/or diagnosis of SARS-CoV-2 by FDA under an Emergency Use Authorization (EUA). This EUA will remain in effect (meaning this test can be used) for the duration of the COVID-19 declaration under Section 564(b)(1) of the Act, 21 U.S.C. section 360bbb-3(b)(1), unless the authorization is terminated or revoked.     Resp Syncytial Virus by PCR NEGATIVE NEGATIVE Final    Comment: (NOTE) Fact Sheet for Patients: bloggercourse.com  Fact Sheet for Healthcare Providers: seriousbroker.it  This test is not yet approved or cleared by the [place]  FDA and has been authorized for detection and/or diagnosis of SARS-CoV-2 by FDA under an Emergency Use Authorization (EUA). This EUA will remain in effect  (meaning this test can be used) for the duration of the COVID-19 declaration under Section 564(b)(1) of the Act, 21 U.S.C. section 360bbb-3(b)(1), unless the authorization is terminated or revoked.  Performed at [facility], [address] [place], [place] [address]   Blood Culture (routine x 2)     Status: None (Preliminary result)   Collection Time: 10/17/24  1:20 PM   Specimen: BLOOD  Result Value Ref Range Status   Specimen Description BLOOD BLOOD RIGHT HAND  Final   Special Requests   Final    BOTTLES DRAWN AEROBIC AND ANAEROBIC Blood Culture results may not be optimal due to an inadequate volume of blood received in culture bottles   Culture   Final    NO GROWTH 2 DAYS Performed at [facility], [address], [place], [place] [address]  Report Status PENDING  Incomplete  Blood Culture (routine x 2)     Status: None (Preliminary result)   Collection Time: 10/17/24  1:29 PM   Specimen: BLOOD  Result Value Ref Range Status   Specimen Description BLOOD BLOOD RIGHT ARM  Final   Special Requests   Final    BOTTLES DRAWN AEROBIC AND ANAEROBIC Blood Culture adequate volume   Culture   Final    NO GROWTH 2 DAYS Performed at [facility], [address], [place], [place] [address]    Report Status PENDING  Incomplete  Urine Culture     Status: None   Collection Time: 10/17/24  5:13 PM   Specimen: Urine, Catheterized  Result Value Ref Range Status   Specimen Description   Final    URINE, CATHETERIZED Performed at [facility], [address] [place], [place] [address]    Special Requests   Final    NONE Performed at [facility], [address] [place], [place] [address]    Culture   Final    NO GROWTH Performed at [facility], [address] [place], [place] [address]    Report Status 10/19/2024 FINAL  Final  CSF culture w Gram Stain     Status: None (Preliminary result)   Collection Time: 10/18/24  1:36 PM   Specimen: PATH Cytology CSF; Cerebrospinal Fluid   Result Value Ref Range Status   Specimen Description CSF  Final   Special Requests NONE  Final   Gram Stain NO WBC SEEN NO ORGANISMS SEEN   Final   Culture   Final    NO GROWTH < 24 HOURS Performed at [facility], [address] [place], [place] [address]    Report Status PENDING  Incomplete  MRSA Next Gen by PCR, Nasal     Status: None   Collection Time: 10/19/24  1:13 PM   Specimen: Nasal Mucosa; Nasal Swab  Result Value Ref Range Status   MRSA by PCR Next Gen NOT DETECTED NOT DETECTED Final    Comment: (NOTE) The GeneXpert MRSA Assay (FDA approved for NASAL specimens only), is one component of a comprehensive MRSA colonization surveillance program. It is not intended to diagnose MRSA infection nor to guide or monitor treatment for MRSA infections. Test performance is not FDA approved in patients less than 56 years old. Performed at [facility], [address] [place], [place] [address]    Evalene Munch, MD [facility] Pager: [phone]  10/19/2024 3:53 PM

## 2024-10-19 NOTE — Plan of Care (Signed)

## 2024-10-19 NOTE — Progress Notes (Signed)
 PT Cancellation Note  Patient Details Name: [name] MRN: [id number] DOB: 1961-06-24   Cancelled Treatment:    Reason Eval/Treat Not Completed: Patient not medically ready (per RN not following commands, limited responsiveness, EEG active and requests hold this date)   Lenoard NOVAK Nahjae Hoeg 10/19/2024, 9:56 AM Lenoard SHAUNNA, PT Acute Rehabilitation Services Office: [phone]

## 2024-10-19 NOTE — Progress Notes (Signed)
 Foley Catheter inserted for urinary retention. Patient had no urine output from 1300-1700. Bladder scan 485 mL. Patient had been requiring in and out catheters on 3 E.  Dr Gatha Notified. Orders received for urethral catheter.

## 2024-10-19 NOTE — Progress Notes (Addendum)
 Intubation Procedure Note  [name]  [id number]  [birth year]  Date:10/19/24  Time:12:59 PM   Provider Performing:Rizwan Kuyper [name]    Procedure: Intubation (31500)  Indication(s) Respiratory Failure  Consent Risks of the procedure as well as the alternatives and risks of each were explained to the patient and/or caregiver.  Consent for the procedure was obtained and is signed in the bedside chart Obtained verbal consent from wife at beside on [address]    Anesthesia Etomidate, Versed , Fentanyl , and Rocuronium    Time Out Verified patient identification, verified procedure, site/side was marked, verified correct patient position, special equipment/implants available, medications/allergies/relevant history reviewed, required imaging and test results available.   Sterile Technique Usual hand hygeine, masks, and gloves were used   Procedure Description Patient positioned in bed supine.  Sedation given as noted above.  Patient was intubated with endotracheal tube using Glidescope.  View was Grade 1 full glottis .  Number of attempts was 1.  Colorimetric CO2 detector was consistent with tracheal placement.   Complications/Tolerance None; patient tolerated the procedure well. Chest X-ray is ordered to verify placement.   EBL Minimal   Specimen(s) None   Sherlean Sharps AGACNP-BC   [facility] 10/19/2024, 1:00 PM  Please see [email or web address] for pager details.  From 7A-7P if no response, please call [phone]. After hours, please call ELink [phone].

## 2024-10-19 NOTE — Progress Notes (Signed)
 NEUROLOGY CONSULT FOLLOW UP NOTE   Date of service: October 19, 2024 Patient Name: GENEROSO CROPPER MRN:  [id number] DOB:  11/29/1961  Interval Hx/subjective   Patient has remained hemodynamically stable with Tmax of 100.4.  Family reports that he has been in and out, sometimes responsive and speaking and sometimes not.  Vitals   Vitals:   10/18/24 2354 10/19/24 0303 10/19/24 0458 10/19/24 0730  BP: 105/70  105/69 (!) 126/94  Pulse: 75  75 74  Resp: (!) 25 (!) 30 20 (!) 23  Temp: 99.8 F (37.7 C)  100.3 F (37.9 C) 99.5 F (37.5 C)  TempSrc: Axillary  Axillary Oral  SpO2: 95%  96% 90%  Weight:      Height:         Body mass index is 29.62 kg/m.  Physical Exam   Constitutional: Appears well-developed and well-nourished.  Neurologic Examination    MS: Patient initially awaken to voice and was able to say he was doing okay, but when asked orientation questions, he suddenly closed his eyes, developed right eye twitching and did not respond to further questions or commands.  After that, he would grimace to sternal rub but did not localize.  He would withdraw left lower extremity to noxious plantar stimulation and intermittently would squeeze hands on command CN: Pupils equal round and reactive to light, does not track examiner, face symmetrical at rest, noncooperative with tongue protrusion Motor: Slight movement of fingers on bilateral upper extremities, withdraws left lower extremity to noxious plantar stimulation but not right  Medications  Current Facility-Administered Medications:    acetaminophen  (TYLENOL ) tablet 650 mg, 650 mg, Oral, Q6H PRN **OR** acetaminophen  (TYLENOL ) suppository 650 mg, 650 mg, Rectal, Q6H PRN, [name], [name], MD, 650 mg at 10/19/24 0652   acyclovir (ZOVIRAX) 800 mg in dextrose  5 % 250 mL IVPB, 800 mg, Intravenous, Q8H, [name], [name], MD, Stopped at 10/19/24 9360   Chlorhexidine  Gluconate Cloth 2 % PADS 6 each, 6 each, Topical,  Daily, [name], [name], MD, 6 each at 10/17/24 2320   lactated ringers  infusion, , Intravenous, Continuous, [name], [name], MD   levETIRAcetam (KEPPRA) undiluted injection 2,500 mg, 2,500 mg, Intravenous, Once, [employer], Cortney E, NP   levETIRAcetam (KEPPRA) undiluted injection 500 mg, 500 mg, Intravenous, Q12H, [place], Cortney E, NP   LORazepam  (ATIVAN ) 2 MG/ML injection, , , ,    ondansetron  (ZOFRAN ) tablet 4 mg, 4 mg, Oral, Q6H PRN **OR** ondansetron  (ZOFRAN ) injection 4 mg, 4 mg, Intravenous, Q6H PRN, [name], [name], MD   pantoprazole  (PROTONIX ) injection 40 mg, 40 mg, Intravenous, Q24H, [name], [name], MD, 40 mg at 10/18/24 1734   polyethylene glycol (MIRALAX  / GLYCOLAX ) packet 17 g, 17 g, Oral, Daily PRN, [name], [name], MD   sodium chloride  flush (NS) 0.9 % injection 10-40 mL, 10-40 mL, Intracatheter, Q12H, [name], [name], MD, 10 mL at 10/18/24 2100   sodium chloride  flush (NS) 0.9 % injection 10-40 mL, 10-40 mL, Intracatheter, PRN, [name], MD  Labs and Diagnostic Imaging   CSF WBC 60 CSF RBC 3200(suspect mildly traumatic given colorless supernatant) CSF protein 92 CSF glucose 55 CSF meningitis panel is negative  Imaging(Personally reviewed): MRI brain-nonenhancing T2 signal in the insula and temporal lobe  Assessment   MASON DIBIASIO is a 63 y.o. male presenting with fever, altered mental status, with abnormal MRI concerning for encephalitis.  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  He has already been started on acyclovir.  I discussed this with the patient's wife and sister.  I discussed that with this particular infection, it is possible that he could worsen though with starting antivirals, my hope is that he would stabilize quickly.  I confirmed that he is indeed full code.  Today, upon discussion with RN and family, discovered the patient  has been in and out, sometimes responding and sometimes not.  On examination today, he suddenly stopped talking to examiner and developed twitching of the right eye.  1 mg of Ativan  given without significant change in symptoms, will load with Keppra and connect to LTM EEG.  Recommendations  No need for continued bacterial coverage Continue acyclovir for at least 14-21 days.  Lorazepam  1 mg for suspected seizure activity Load with Keppra 2500 mg and begin Keppra 500 mg twice daily LTM EEG Neurology will follow ______________________________________________________________________  Patient seen by NP and then by MD, MD to edit note as needed. Cortney E Everitt Clint Kill , MSN, AGACNP-BC [facility] See Amion for schedule and pager information 10/19/2024 8:45 AM   I saw the patient a little bit later in the day, his mental status had declined considerably.  He would respond to noxious stimulation, but he would not answer questions or follow commands.  Though the 1 mg of lorazepam  could be playing some role, with his worsening I am highly concerned that he may have continued decline, and recommended to the hospitalist to discuss with critical care possibly moving him to the unit preemptively.  He also need long-term EEG monitoring.  Will continue Keppra  This patient is critically ill and at significant risk of neurological worsening, death and care requires constant monitoring of vital signs, hemodynamics,respiratory and cardiac monitoring, neurological assessment, discussion with family, other specialists and medical decision making of high complexity. I spent 38 minutes of neurocritical care time  in the care of  this patient. This was time spent independent of any time provided by nurse practitioner or PA.  Aisha Seals, MD [facility]   If 7pm- 7am, please page neurology on call as listed in AMION. 10/19/2024  7:22 PM

## 2024-10-19 NOTE — Progress Notes (Signed)
 Interim CCM Progress Note   Patient ID: [name], male   DOB: 1961-08-19, 63 y.o.   MRN: [id number]  PCCM consulted for ICU eval per Neuro, and airway watch. Upon arrival, patient obtunded, using accessory muscles beginning to desaturate to upper 80s on RA, tachypnea- 30s to 40s. Placed on 4 LNC immediately-O2 sats improved to 95%. Patient not able to maintain secretions. Placed ICU transfer orders STAT. Discussed with wife at beside in regards to needing to place patient on a mechanical ventilator for airway protection.   Sherlean Sharps AGACNP-BC   [facility] 10/19/2024, 12:21 PM  Please see [email or web address] for pager details.  From 7A-7P if no response, please call [phone]. After hours, please call ELink [phone].

## 2024-10-19 NOTE — Consult Note (Signed)
 NAME:  [name], MRN:  [id number], DOB:  11-Nov-1961, LOS: 2 ADMISSION DATE:  10/17/2024, CONSULTATION DATE:  10/19/24 REFERRING MD:  Michaela CHIEF COMPLAINT:  AMS  History of Present Illness:  Patient is a 63 year old male with significant past medical history arthritis, skin cancer of left arm, GERD, and small bowel obstruction in the past who presented to [facility], ED with complaints of generalized weakness and after a fall 10/17/2024.  Per report patient had been not feeling well/sick for over 3 weeks began with symptoms of upper respiratory tract infection-congestion, headaches, cough and some nausea/vomiting with diarrhea.  Patient was seen outpatient and prescribed doxycycline  on 11/7 and was compliant but unfortunately symptoms remained.  Within the emergency department, it appears that patient had worsening mental status and therefore MRI of head was obtained showing abnormal diffusion/FLAIR signal abnormality involved with the insular left cortex and inferior left frontal lobe and the mesial left temporal lobe.  Concerns for acute encephalitis were noted.  LP was completed with CSF WBC 60, RBCs 3200, protein 92, glucose 55, meningitis panel positive for HSV 1.  Patient was already receiving broad-spectrum and antibiotics as well as acyclovir.  Unfortunately patient began developing seizure activity on 11/14, neurology already following and recommended ICU transfer for airway protection as well as initiating AEDs-Ativan  1 mg given and Keppra was loaded.  PCCM consulted for ICU transfer.  Upon arrival the patient's room on 3 E., patient was obtunded and utilizing accessory muscle use as well as beginning to desaturate and not to manage secretions.  Wife at bedside.  Discussed with wife that patient will need to be transferred to ICU and urgently intubated.  Wife understood and consented to procedure verbally.  Pertinent  Medical History   Past Medical History:  Diagnosis Date    Allergy    Arthritis    Calcaneus fracture, left    Cancer (HCC)    Skin cancer Left Arm   GERD (gastroesophageal reflux disease)    Small bowel obstruction (HCC)      Significant Hospital Events: Including procedures, antibiotic start and stop dates in addition to other pertinent events   11/12 admit for altered mental status generalized weakness, fall 11/13 LP completed indicating HSV1 encephalitis 11/14 worsening altered mental status with seizure activity, started on AEDs-transferred to ICU for intubation due to unable to protect airway  Interim History / Subjective:  Obtunded, not responding to painful stimuli Desaturating to the upper 80s on room air Utilizing assessor muscle use  Called rapid to bedside, stat ICU transfer for intubation  On LTM Objective    Blood pressure (!) 126/94, pulse 74, temperature 99.5 F (37.5 C), temperature source Oral, resp. rate (!) 23, height 5' 5 (1.651 m), weight 80.7 kg, SpO2 90%.        Intake/Output Summary (Last 24 hours) at 10/19/2024 1151 Last data filed at 10/19/2024 1146 Gross per 24 hour  Intake 1350.66 ml  Output 3600 ml  Net -2249.34 ml   Filed Weights   10/17/24 1251  Weight: 80.7 kg    Examination: General: acute on chronic older adult male lying on floor bed, with signs of respiratory distress HEENT: Normocephalic, PERRLA intact-sluggish, Pink MM CV: s1,s2, RRR, no MRG, No JVD  pulm: clear, diminished, on room air originally placed 4 L nasal cannula Stat for hypoxia Abs: bs active, soft  Extremities: no edema, no deformity, did not remove extremities to pain on exam Skin: no rash  Neuro: Rass -4, cough gag  reflex present but weak GU: Male PureWick  Resolved problem list   Assessment and Plan  HSV 1 encephalitis Severe sepsis Acute metabolic encephalopathy Lactic acidosis Seizures 11/13 LP- CSF WBC 60, RBCs 3200, protein 92, glucose 55, positive for HSV 1 MRI 11/12 Abnormal diffusion and FLAIR signal  abnormality involving the left insular cortex and inferior left frontal lobe, as well as the mesial left temporal lobe. Findings are nonspecific, but could reflect sequelae of acute encephalitis P: Neurology following appreciate assistance-started Keppra and loaded with earlier today as well as given 1 mg Ativan  for seizures Continue LTM per neuro Utilize Precedex and fentanyl  infusions for sedation while on ventilator see below Obtain lactic and trend, initial lactate was 3.4 upon admission 2.9> 1.8> 2.2 Placed on seizure precautions as well as aspiration precautions Once extubated will need to be placed on fall precautions, since history of fall As needed Versed  for breakthrough seizure Continue neuroprotective measures: Euvolemia, normoxia, normocapnia, normothermia, eunatremia Did have initial leukocytosis but has resolved, continue to trend If concern for significant neurological change obtain stat CT scan/noncontrast  Acute hypoxic respiratory failure P:  Intubate for airway protection Continue ventilator support and lung protective strategies  Continue LTVV  Wean PEEP and Fio2 requirements to sat goal of >92%  HOB > 30 degrees Plat < 30  Aim for Driving pressures < 15  Intermittent Chest X-ray and ABGS Obtain and follow cultures-blood and tracheal aspirate VAP and PAD protocols in place  Utilize fentanyl  and Precedex while on LTM, if patient becomes agitated can switch to propofol  Follow-up with chest x-ray post intubation as well as ABG  Hyponatremia Slowly improving P: Continue to monitor trend especially in the setting of seizure precautions  GERD P: Continue PPI   Labs   CBC: Recent Labs  Lab 10/17/24 1329 10/18/24 0658  WBC 13.1* 7.4  NEUTROABS 11.5*  --   HGB 15.0 12.5*  HCT 40.4 33.9*  MCV 90.0 89.9  PLT 215 153    Basic Metabolic Panel: Recent Labs  Lab 10/17/24 1329 10/18/24 0658 10/18/24 1752  NA 130* 128* 132*  K 3.7 3.5 3.7  CL 95* 98  101  CO2 20* 19* 20*  GLUCOSE 106* 140* 92  BUN 25* 16 15  CREATININE 1.18 0.95 1.00  CALCIUM  8.7* 7.9* 7.8*   GFR: Estimated Creatinine Clearance: 75.9 mL/min (by C-G formula based on SCr of 1 mg/dL). Recent Labs  Lab 10/17/24 1329 10/17/24 1512 10/18/24 0658 10/18/24 1752 10/18/24 2014  WBC 13.1*  --  7.4  --   --   LATICACIDVEN 3.4* 2.9*  --  1.8 2.2*    Liver Function Tests: Recent Labs  Lab 10/17/24 1329  AST 38  ALT 37  ALKPHOS 43  BILITOT 1.2  PROT 7.0  ALBUMIN 4.4   No results for input(s): LIPASE, AMYLASE in the last 168 hours. No results for input(s): AMMONIA in the last 168 hours.  ABG    Component Value Date/Time   HCO3 24.8 10/17/2024 1838   ACIDBASEDEF 1.5 10/17/2024 1838   O2SAT 90.4 10/17/2024 1838     Coagulation Profile: Recent Labs  Lab 10/17/24 1329  INR 1.1    Cardiac Enzymes: No results for input(s): CKTOTAL, CKMB, CKMBINDEX, TROPONINI in the last 168 hours.  HbA1C: HB A1C (BAYER DCA - WAIVED)  Date/Time Value Ref Range Status  11/19/2021 01:55 PM 5.1 4.8 - 5.6 % Final    Comment:  Prediabetes: 5.7 - 6.4          Diabetes: >6.4          Glycemic control for adults with diabetes: <7.0    Hgb A1c MFr Bld  Date/Time Value Ref Range Status  11/19/2019 04:38 PM 6.0 (H) 4.8 - 5.6 % Final    Comment:    (NOTE) Pre diabetes:          5.7%-6.4% Diabetes:              >6.4% Glycemic control for   <7.0% adults with diabetes   12/21/2017 03:14 PM 5.4 4.8 - 5.6 % Final    Comment:             Prediabetes: 5.7 - 6.4          Diabetes: >6.4          Glycemic control for adults with diabetes: <7.0     CBG: Recent Labs  Lab 10/18/24 0032 10/18/24 0804 10/18/24 1612 10/18/24 2353 10/19/24 0817  GLUCAP 98 132* 142* 96 102*    Review of Systems:   See HPI  Past Medical History:  He,  has a past medical history of Allergy, Arthritis, Calcaneus fracture, left, Cancer (HCC), GERD (gastroesophageal  reflux disease), and Small bowel obstruction (HCC).   Surgical History:   Past Surgical History:  Procedure Laterality Date   FOOT SURGERY     HAND SURGERY Right    contracture of hand   INGUINAL HERNIA REPAIR Right 11/23/2013   Procedure: HERNIA REPAIR INGUINAL ADULT;  Surgeon: Elsie GORMAN Holland, MD;  Location: [facility];  Service: General;  Laterality: Right;  site-inguinal area   OPEN REDUCTION, INTERNAL FIXATION (ORIF) CALCANEAL FRACTURE WITH FUSION Left 11/30/2018   Procedure: OPEN REDUCTION, INTERNAL FIXATION (ORIF) CALCANEAL FRACTURE WITH PERONEAL DEBRIDEMENT & REPAIR OF SUPERIOR PERONEAL RETINACULUM;  Surgeon: Elsa Lonni SAUNDERS, MD;  Location: [facility];  Service: Orthopedics;  Laterality: Left;   VASECTOMY     WRIST SURGERY Right    otif     Social History:   reports that he has never smoked. He has never used smokeless tobacco. He reports current alcohol use of about 4.0 standard drinks of alcohol per week. He reports that he does not use drugs.   Family History:  His family history includes Alzheimer's disease in his father; Cancer in his maternal grandmother and mother; Early death in his brother; Heart disease in his father and sister; Stroke in his mother.   Allergies Allergies  Allergen Reactions   Morphine And Codeine Swelling   Bee Venom Rash     Home Medications  Prior to Admission medications   Medication Sig Start Date End Date Taking? Authorizing Provider  Albuterol -Budesonide (AIRSUPRA) 90-80 MCG/ACT AERO Inhale 2 puffs into the lungs as needed (wheezing, shortness of breath). Max of 12 puffs per 24 hours 10/12/24  Yes Joesph Annabella HERO, FNP  doxycycline  (VIBRA -TABS) 100 MG tablet Take 1 tablet (100 mg total) by mouth 2 (two) times daily for 7 days. 1 po bid 10/12/24 10/19/24 Yes Joesph Annabella HERO, FNP  esomeprazole (NEXIUM) 20 MG capsule Take 20 mg by mouth daily at 12 noon.   Yes [provider]  famotidine  (PEPCID ) 20 MG tablet Take 1 tablet (20 mg  total) by mouth 2 (two) times daily as needed for heartburn or indigestion (breakthrough not controlled by Protonix ). 06/13/24  Yes Dettinger, Fonda LABOR, MD  fluticasone  (FLONASE ) 50 MCG/ACT nasal spray Place 2 sprays into both  nostrils daily. 09/28/24  Yes Hawks, Christy A, FNP  mirtazapine  (REMERON ) 7.5 MG tablet Take 1 tablet (7.5 mg total) by mouth at bedtime. For anxiety/ sleep 12/05/23  Yes Dettinger, Fonda LABOR, MD  ondansetron  (ZOFRAN ) 4 MG tablet Take 1 tablet (4 mg total) by mouth every 8 (eight) hours as needed for nausea or vomiting. 10/15/24  Yes Hawks, Christy A, FNP  sertraline  (ZOLOFT ) 50 MG tablet Take 1 tablet (50 mg total) by mouth daily. 12/05/23  Yes Dettinger, Fonda LABOR, MD  pantoprazole  (PROTONIX ) 40 MG tablet TAKE ONE (1) TABLET BY MOUTH EVERY DAY Patient not taking: Reported on 10/17/2024 09/20/24   Dettinger, Fonda LABOR, MD     Critical care time: 70 mins     Christian Jerold Yoss AGACNP-BC   [facility] 10/19/2024, 1:38 PM  Please see [email or web address] for pager details.  From 7A-7P if no response, please call [phone]. After hours, please call ELink [phone].

## 2024-10-19 NOTE — Significant Event (Signed)
 Rapid Response Event Note   Called by CCM NP for assistance getting patient to ICU. Patient obtunded, using accessory muscles, concern for airway protection. ICU orders already in place. Assisted in tx to 2M14 and intubation.   Tonna Chiquita POUR, RN

## 2024-10-19 NOTE — Progress Notes (Signed)
 Brief Nutrition Note  Consult for tube feeding received. The patient was emergently transferred to the ICU today s/p rapid response due to being found obtunded and has been intubated. He is currently sedated, not on pressors. Cortrak placed today.  Minute Volume: 10.9 L Temperature Max (past 12 hours): 37.9 C Propofol  rate: N/A  Estimated Nutritional Needs:  Kcal:  1900-2200 Protein:  90-115 Fluid:  >1900  INTERVENTION:  Begin Vital AF 1.2 via Cortrak at 15 mL/hr. Increase by 10 mL/hr every 6 hours as tolerated to 65 mL/hr goal. This enteral nutrition regimen provides 1872 Kcals, 117 g protein, 173 g carbohydrates, and 1265 mL free water  daily, which meets 100% of the patient's estimated nutrition needs. Please see full nutrition assessment dated 11/13 for greater details.   Leverne Ruth, MS, RDN, LDN [facility] See AMION for contact information

## 2024-10-19 NOTE — Progress Notes (Signed)
   10/19/24 1411  Airway 7.5 mm  Placement Date/Time: 10/19/24 1240   Placed By: ICU physician  Airway Device: Endotracheal Tube  Laryngoscope Blade: MAC;4  ETT Types: Endobronchial  Size (mm): 7.5 mm  Cuffed: Cuffed  Insertion attempts: 1  Airway Equipment: Video Laryngoscope;Stylet...  Secured at (cm) (S)  25 cm (per CCM order)  Measured From (S)  Lips  Secured Location (S)  Left   ETT was pulled back by 2 cm. 27 to 25 at the lip. No complications noted with change.

## 2024-10-20 ENCOUNTER — Inpatient Hospital Stay (HOSPITAL_COMMUNITY)

## 2024-10-20 DIAGNOSIS — R6521 Severe sepsis with septic shock: Secondary | ICD-10-CM

## 2024-10-20 DIAGNOSIS — R4182 Altered mental status, unspecified: Secondary | ICD-10-CM | POA: Diagnosis not present

## 2024-10-20 DIAGNOSIS — B004 Herpesviral encephalitis: Secondary | ICD-10-CM | POA: Diagnosis not present

## 2024-10-20 DIAGNOSIS — A419 Sepsis, unspecified organism: Secondary | ICD-10-CM | POA: Diagnosis not present

## 2024-10-20 DIAGNOSIS — B1009 Other human herpesvirus encephalitis: Secondary | ICD-10-CM | POA: Diagnosis not present

## 2024-10-20 DIAGNOSIS — G9340 Encephalopathy, unspecified: Secondary | ICD-10-CM | POA: Diagnosis not present

## 2024-10-20 DIAGNOSIS — R569 Unspecified convulsions: Secondary | ICD-10-CM | POA: Diagnosis not present

## 2024-10-20 LAB — IGG CSF INDEX
Albumin CSF-mCnc: 56 mg/dL — ABNORMAL HIGH (ref 15–55)
Albumin: 3.6 g/dL — ABNORMAL LOW (ref 3.9–4.9)
CSF IgG Index: 0.7 (ref 0.0–0.7)
IgG (Immunoglobin G), Serum: 514 mg/dL — ABNORMAL LOW (ref 603–1613)
IgG, CSF: 5.5 mg/dL (ref 0.0–10.3)
IgG/Alb Ratio, CSF: 0.1 (ref 0.00–0.25)

## 2024-10-20 LAB — CBC
HCT: 35.3 % — ABNORMAL LOW (ref 39.0–52.0)
Hemoglobin: 12.7 g/dL — ABNORMAL LOW (ref 13.0–17.0)
MCH: 32.6 pg (ref 26.0–34.0)
MCHC: 36 g/dL (ref 30.0–36.0)
MCV: 90.7 fL (ref 80.0–100.0)
Platelets: 155 K/uL (ref 150–400)
RBC: 3.89 MIL/uL — ABNORMAL LOW (ref 4.22–5.81)
RDW: 13.2 % (ref 11.5–15.5)
WBC: 6.5 K/uL (ref 4.0–10.5)
nRBC: 0 % (ref 0.0–0.2)

## 2024-10-20 LAB — GLUCOSE, CAPILLARY
Glucose-Capillary: 100 mg/dL — ABNORMAL HIGH (ref 70–99)
Glucose-Capillary: 123 mg/dL — ABNORMAL HIGH (ref 70–99)
Glucose-Capillary: 126 mg/dL — ABNORMAL HIGH (ref 70–99)
Glucose-Capillary: 136 mg/dL — ABNORMAL HIGH (ref 70–99)
Glucose-Capillary: 138 mg/dL — ABNORMAL HIGH (ref 70–99)
Glucose-Capillary: 144 mg/dL — ABNORMAL HIGH (ref 70–99)

## 2024-10-20 LAB — BASIC METABOLIC PANEL WITH GFR
Anion gap: 11 (ref 5–15)
BUN: 14 mg/dL (ref 8–23)
CO2: 22 mmol/L (ref 22–32)
Calcium: 7.9 mg/dL — ABNORMAL LOW (ref 8.9–10.3)
Chloride: 99 mmol/L (ref 98–111)
Creatinine, Ser: 0.83 mg/dL (ref 0.61–1.24)
GFR, Estimated: >60 mL/min (ref >60)
Glucose, Bld: 108 mg/dL — ABNORMAL HIGH (ref 70–99)
Potassium: 4.3 mmol/L (ref 3.5–5.1)
Sodium: 132 mmol/L — ABNORMAL LOW (ref 135–145)

## 2024-10-20 LAB — LACTIC ACID, PLASMA: Lactic Acid, Venous: 1 mmol/L (ref 0.5–1.9)

## 2024-10-20 LAB — MISC LABCORP TEST (SEND OUT): Labcorp test code: 505625

## 2024-10-20 MED ORDER — LACTATED RINGERS IV SOLN: INTRAVENOUS | Status: DC

## 2024-10-20 MED ORDER — ENOXAPARIN SODIUM 40 MG/0.4ML IJ SOSY
40.0000 mg | PREFILLED_SYRINGE | INTRAMUSCULAR | Status: DC
  Administered 2024-10-20 – 2024-10-30 (×11): 40 mg via SUBCUTANEOUS
  Filled 2024-10-20 (×11): qty 0.4

## 2024-10-20 MED ORDER — ACETAMINOPHEN 650 MG RE SUPP: 650.0000 mg | Freq: Four times a day (QID) | RECTAL | Status: DC | PRN

## 2024-10-20 MED ORDER — ACETAMINOPHEN 325 MG PO TABS
650.0000 mg | ORAL_TABLET | Freq: Four times a day (QID) | ORAL | Status: DC | PRN
  Administered 2024-10-20 – 2024-10-23 (×6): 650 mg
  Filled 2024-10-20 (×6): qty 2

## 2024-10-20 NOTE — Progress Notes (Signed)
 PT Cancellation Note  Patient Details Name: [name] MRN: [id number] DOB: 05-03-1961   Cancelled Treatment:    Reason Eval/Treat Not Completed: Patient not medically ready.  (Per RN, pt currently intubated and undergoing EEG and not appropriate for acute PT eval at this time. PT to reattempt to see pt at a later time as appropriate/available.)  10/20/2024  [place] HERO., PT Acute Rehabilitation Services [phone]  (office)   Vinie GAILS Lundon Rosier 10/20/2024, 9:54 AM

## 2024-10-20 NOTE — Progress Notes (Signed)
 NEUROLOGY CONSULT FOLLOW UP NOTE   Date of service: October 20, 2024 Patient Name: [name] MRN:  [id number] DOB:  10-27-1961  Interval Hx/subjective   No significant changes   Vitals   Vitals:   10/20/24 1745 10/20/24 1800 10/20/24 1815 10/20/24 1830  BP: 101/74 107/75 102/78 99/74  Pulse: 92 94 100 (!) 102  Resp: 20 20 20 20   Temp:      TempSrc:      SpO2: 98% 98% 97% 97%  Weight:      Height:         Body mass index is 30.01 kg/m.  Physical Exam   Constitutional: Appears well-developed and well-nourished.  Neurologic Examination    MS: He opens eyes partially to voice, does not follow commands CN: Eyes are slightly disconjugate, pupils are reactive bilaterally, with increasing levels of arousal he does conjugate his eyes Motor: He withdraws to noxious stimulation in all four extremities Sensory: As above  Medications  Current Facility-Administered Medications:    acetaminophen  (TYLENOL ) tablet 650 mg, 650 mg, Per Tube, Q6H PRN, 650 mg at 10/20/24 1536 **OR** acetaminophen  (TYLENOL ) suppository 650 mg, 650 mg, Rectal, Q6H PRN, [name], MD   acyclovir (ZOVIRAX) 800 mg in dextrose  5 % 250 mL IVPB, 800 mg, Intravenous, Q8H, [name], [name], MD, Stopped at 10/20/24 1512   Chlorhexidine  Gluconate Cloth 2 % PADS 6 each, 6 each, Topical, Daily, [name], [name], MD, 6 each at 10/20/24 1045   dexmedetomidine (PRECEDEX) 400 MCG/100ML (4 mcg/mL) infusion, 0-1.2 mcg/kg/hr, Intravenous, Titrated, Claudene Chew C, NP, Last Rate: 10.09 mL/hr at 10/20/24 1800, 0.5 mcg/kg/hr at 10/20/24 1800   docusate (COLACE) 50 MG/5ML liquid 100 mg, 100 mg, Per Tube, BID, Smith, Joshua C, NP, 100 mg at 10/20/24 9066   enoxaparin  (LOVENOX ) injection 40 mg, 40 mg, Subcutaneous, Q24H, [name], MD, 40 mg at 10/20/24 1536   feeding supplement (VITAL AF 1.2 CAL) liquid 1,000 mL, 1,000 mL, Per Tube, Continuous, [name], [name], MD, Last Rate: 35 mL/hr at 10/20/24  1800, Infusion Verify at 10/20/24 1800   fentaNYL  (SUBLIMAZE ) bolus via infusion 25-100 mcg, 25-100 mcg, Intravenous, Q15 min PRN, Claudene Chew C, NP   fentaNYL  in NS (39mcg/ml) infusion-PREMIX, 0-400 mcg/hr, Intravenous, Continuous, Claudene Chew C, NP, Last Rate: 5 mL/hr at 10/20/24 1800, 50 mcg/hr at 10/20/24 1800   lactated ringers  infusion, , Intravenous, Continuous, [name], MD, Last Rate: 50 mL/hr at 10/20/24 1800, Infusion Verify at 10/20/24 1800   levETIRAcetam (KEPPRA) undiluted injection 500 mg, 500 mg, Intravenous, Q12H, de [place], [place] E, NP, 500 mg at 10/20/24 9065   midazolam  PF (VERSED ) injection 1-2 mg, 1-2 mg, Intravenous, Q1H PRN, Smith, Joshua C, NP   norepinephrine (LEVOPHED) 4mg  in (0.016 mg/mL) premix infusion, 0-40 mcg/min, Intravenous, Titrated, Claudene Chew C, NP, Last Rate: 7.5 mL/hr at 10/20/24 1800, 2 mcg/min at 10/20/24 1800   ondansetron  (ZOFRAN ) tablet 4 mg, 4 mg, Oral, Q6H PRN **OR** ondansetron  (ZOFRAN ) injection 4 mg, 4 mg, Intravenous, Q6H PRN, [name], [name], MD   Oral care mouth rinse, 15 mL, Mouth Rinse, Q2H, [name], [name], MD, 15 mL at 10/20/24 1731   Oral care mouth rinse, 15 mL, Mouth Rinse, PRN, [name], [name], MD   pantoprazole  (PROTONIX ) injection 40 mg, 40 mg, Intravenous, Q24H, [name], [name], MD, 40 mg at 10/20/24 1724   polyethylene glycol (MIRALAX  / GLYCOLAX ) packet 17 g, 17 g, Oral, Daily PRN, [name], [name], MD  polyethylene glycol (MIRALAX  / GLYCOLAX ) packet 17 g, 17 g, Per Tube, Daily, Claudene Chew C, NP, 17 g at 10/20/24 9066   sodium chloride  flush (NS) 0.9 % injection 10-40 mL, 10-40 mL, Intracatheter, Q12H, [name], [name], MD, 10 mL at 10/20/24 1000   sodium chloride  flush (NS) 0.9 % injection 10-40 mL, 10-40 mL, Intracatheter, PRN, [name], MD  Labs and Diagnostic Imaging   CSF WBC 60 CSF RBC 3200(suspect mildly traumatic given colorless supernatant) CSF  protein 92 CSF glucose 55 CSF meningitis panel is negative  Creatinine 0.93   Assessment   [name] is a 63 y.o. 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  It discharges on his EEG, but no signs of ongoing seizure activity.  Will continue Keppra and continue to monitor.  Recommendations  Contineu keppra 500mg  BID Continue EEG Will follow ______________________________________________________________________   Signed, Aisha Seals, MD [facility]

## 2024-10-20 NOTE — Progress Notes (Signed)
 LTM maint complete - no skin breakdown under: Fp1 Fp2 [facility] monitored, Event button test confirmed by [facility].

## 2024-10-20 NOTE — Procedures (Addendum)
 Patient Name: [name]  MRN: [id number]  Epilepsy Attending: Arlin MALVA Krebs  Referring Physician/Provider: everitt Clint Abbey Earle FORBES, NP  Duration: 10/19/2024 0947 to 10/20/2024 1100  Patient history: 63yo M with ams. EEG to evaluate for seizure   Level of alertness: awake/lethargic   AEDs during EEG study: LEV   Technical aspects: This EEG study was done with scalp electrodes positioned according to the 10-20 International system of electrode placement. Electrical activity was reviewed with band pass filter of 1-70Hz , sensitivity of 7 uV/mm, display speed of 82mm/sec with a 60Hz  notched filter applied as appropriate. EEG data were recorded continuously and digitally stored.  Video monitoring was available and reviewed as appropriate.   Description: EEG showed continuous generalized and lateralized left hemisphere 3 to 6 Hz theta-delta slowing. Lateralized periodic discharges were noted in left hemisphere at 0.25hz  which gradually resolved. Hyperventilation and photic stimulation were not performed.     EEG was disconnected between 11/14/20251223 to 1329.    ABNORMALITY - Lateralized periodic discharges, left hemisphere  - Continuous slow, left hemisphere - Intermittent slow, generalized   IMPRESSION: This study is showed evidence of epileptogenicity and cortical dysfunction arising from left hemisphere likely secondary to underlying structural abnormality and increased risk of seizure recurrence. Additionally there is generalized cerebral dysfunction (encephalopathy). No seizures were seen throughout the recording.   [name]

## 2024-10-20 NOTE — Progress Notes (Signed)
 OT Cancellation Note  Patient Details Name: [name] MRN: [id number] DOB: Dec 23, 1960   Cancelled Treatment:    Reason Eval/Treat Not Completed: Patient not medically ready (Per RN, pt currently intubated and undergoing EEG and not appropriate for acute OT eval at this time. OT to reattempt to see pt at a later time as appropriate/available.)  Margarie Rockey HERO., OTR/L, MA Acute Rehab [phone]   [name] 10/20/2024, 9:13 AM

## 2024-10-20 NOTE — Consult Note (Signed)
 NAME:  [name], MRN:  [id number], DOB:  March 14, 1961, LOS: 3 ADMISSION DATE:  10/17/2024, CONSULTATION DATE:  10/19/24 REFERRING MD:  Michaela CHIEF COMPLAINT:  AMS  History of Present Illness:  Patient is a 63 year old male with significant past medical history arthritis, skin cancer of left arm, GERD, and small bowel obstruction in the past who presented to [facility], ED with complaints of generalized weakness and after a fall 10/17/2024.  Per report patient had been not feeling well/sick for over 3 weeks began with symptoms of upper respiratory tract infection-congestion, headaches, cough and some nausea/vomiting with diarrhea.  Patient was seen outpatient and prescribed doxycycline  on 11/7 and was compliant but unfortunately symptoms remained.  Within the emergency department, it appears that patient had worsening mental status and therefore MRI of head was obtained showing abnormal diffusion/FLAIR signal abnormality involved with the insular left cortex and inferior left frontal lobe and the mesial left temporal lobe.  Concerns for acute encephalitis were noted.  LP was completed with CSF WBC 60, RBCs 3200, protein 92, glucose 55, meningitis panel positive for HSV 1.  Patient was already receiving broad-spectrum and antibiotics as well as acyclovir.  Unfortunately patient began developing seizure activity on 11/14, neurology already following and recommended ICU transfer for airway protection as well as initiating AEDs-Ativan  1 mg given and Keppra was loaded.  PCCM consulted for ICU transfer.  Upon arrival the patient's room on 3 E., patient was obtunded and utilizing accessory muscle use as well as beginning to desaturate and not to manage secretions.  Wife at bedside.  Discussed with wife that patient will need to be transferred to ICU and urgently intubated.  Wife understood and consented to procedure verbally.  Pertinent  Medical History   Past Medical History:  Diagnosis Date    Allergy    Arthritis    Calcaneus fracture, left    Cancer (HCC)    Skin cancer Left Arm   GERD (gastroesophageal reflux disease)    Small bowel obstruction (HCC)      Significant Hospital Events: Including procedures, antibiotic start and stop dates in addition to other pertinent events   11/12 admit for altered mental status generalized weakness, fall 11/13 LP completed indicating HSV1 encephalitis 11/14 worsening altered mental status with seizure activity, started on AEDs-transferred to ICU for intubation due to unable to protect airway  Interim History / Subjective:  Fever Tmax 101.3 Intubated for seizure yesterday. On minimal vent settings Objective    Blood pressure 113/83, pulse 94, temperature 99.9 F (37.7 C), temperature source Axillary, resp. rate 20, height 5' 5 (1.651 m), weight 81.8 kg, SpO2 98%.    Vent Mode: PRVC FiO2 (%):  [40 %-100 %] 40 % Set Rate:  [20 bmp-24 bmp] 20 bmp Vt Set:  [490 mL] 490 mL PEEP:  [5 cmH20] 5 cmH20 Plateau Pressure:  [15 cmH20-23 cmH20] 16 cmH20   Intake/Output Summary (Last 24 hours) at 10/20/2024 0736 Last data filed at 10/20/2024 0600 Gross per 24 hour  Intake 3072.86 ml  Output 2860 ml  Net 212.86 ml   Filed Weights   10/17/24 1251 10/20/24 0358  Weight: 80.7 kg 81.8 kg   Physical Exam: General: Critically ill-appearing, no acute distress HENT: [place], AT, ETT in place Eyes: EOMI, no scleral icterus Respiratory: Clear to auscultation bilaterally.  No crackles, wheezing or rales Cardiovascular: RRR, -M/R/G, no JVD GI: BS+, soft, nontender Extremities:-Edema,-tenderness Neuro: Sedated, 3mm PERRL, resists opening eyes but no withdrawal to noxious stimuli, +cough/gag  GU: Foley in place  Bcx, CSFcx, UCx NGTD  Resolved problem list   Assessment and Plan   HSV-1 Encephalitis with seizure Acute encephalopathy 2/2 above MRI with insular left cortex and inferior left frontal lobe and mesial left temporal lobe abnormality.  Meningitis/encephalitis panel + HSV-1 - ID following: Acyclovir - Neurology following: PRN ativan , Keppra - cEEG - Seizure precautions - Continue neuroprotective measures: Euvolemia, normoxia, normocapnia, normothermia, eunatremia  Septic shock 2/2 encephalitis Sedation related hypotension Lactic acidosis - resolved - Wean levophed for MAP >65 - Antibiotics as above - LR@50  cc/h x 24 hours  Acute hypoxemic respiratory failure - Full vent support - LTVV, 4-8cc/kg IBW with goal Pplat<30 and DP<15 - VAP - Hold SBT/WUA until critical illness resolves PAD protocol for RASS -1: Precedex, Fentanyl   GERD P: Continue PPI  Depression ] P: Hold sertraline    Critical care time: 45 min     The patient is critically ill with multiple organ systems failure and requires high complexity decision making for assessment and support, frequent evaluation and titration of therapies, application of advanced monitoring technologies and extensive interpretation of multiple databases.  Independent Critical Care Time: 45 Minutes.   Slater Staff, M.D. [facility] Pulmonary/Critical Care Medicine 10/20/2024 7:36 AM   Please see Amion for pager number to reach on-call Pulmonary and Critical Care Team.

## 2024-10-21 ENCOUNTER — Inpatient Hospital Stay (HOSPITAL_COMMUNITY)

## 2024-10-21 DIAGNOSIS — A419 Sepsis, unspecified organism: Secondary | ICD-10-CM | POA: Diagnosis not present

## 2024-10-21 DIAGNOSIS — B1009 Other human herpesvirus encephalitis: Secondary | ICD-10-CM | POA: Diagnosis not present

## 2024-10-21 DIAGNOSIS — B004 Herpesviral encephalitis: Secondary | ICD-10-CM | POA: Diagnosis not present

## 2024-10-21 DIAGNOSIS — R569 Unspecified convulsions: Secondary | ICD-10-CM | POA: Diagnosis not present

## 2024-10-21 DIAGNOSIS — R6521 Severe sepsis with septic shock: Secondary | ICD-10-CM | POA: Diagnosis not present

## 2024-10-21 DIAGNOSIS — G9340 Encephalopathy, unspecified: Secondary | ICD-10-CM | POA: Diagnosis not present

## 2024-10-21 DIAGNOSIS — R4182 Altered mental status, unspecified: Secondary | ICD-10-CM | POA: Diagnosis not present

## 2024-10-21 LAB — GLUCOSE, CAPILLARY
Glucose-Capillary: 124 mg/dL — ABNORMAL HIGH (ref 70–99)
Glucose-Capillary: 163 mg/dL — ABNORMAL HIGH (ref 70–99)
Glucose-Capillary: 140 mg/dL — ABNORMAL HIGH (ref 70–99)
Glucose-Capillary: 196 mg/dL — ABNORMAL HIGH (ref 70–99)
Glucose-Capillary: 138 mg/dL — ABNORMAL HIGH (ref 70–99)
Glucose-Capillary: 199 mg/dL — ABNORMAL HIGH (ref 70–99)

## 2024-10-21 LAB — CSF CULTURE W GRAM STAIN
Culture: NO GROWTH
Gram Stain: NONE SEEN

## 2024-10-21 LAB — BASIC METABOLIC PANEL WITH GFR
Anion gap: 10 (ref 5–15)
BUN: 10 mg/dL (ref 8–23)
CO2: 24 mmol/L (ref 22–32)
Calcium: 7.8 mg/dL — ABNORMAL LOW (ref 8.9–10.3)
Chloride: 96 mmol/L — ABNORMAL LOW (ref 98–111)
Creatinine, Ser: 0.86 mg/dL (ref 0.61–1.24)
GFR, Estimated: >60 mL/min (ref >60)
Glucose, Bld: 123 mg/dL — ABNORMAL HIGH (ref 70–99)
Potassium: 3.8 mmol/L (ref 3.5–5.1)
Sodium: 130 mmol/L — ABNORMAL LOW (ref 135–145)

## 2024-10-21 LAB — CBC
HCT: 38.8 % — ABNORMAL LOW (ref 39.0–52.0)
Hemoglobin: 14.1 g/dL (ref 13.0–17.0)
MCH: 32.8 pg (ref 26.0–34.0)
MCHC: 36.3 g/dL — ABNORMAL HIGH (ref 30.0–36.0)
MCV: 90.2 fL (ref 80.0–100.0)
Platelets: 167 K/uL (ref 150–400)
RBC: 4.3 MIL/uL (ref 4.22–5.81)
RDW: 13 % (ref 11.5–15.5)
WBC: 6.1 K/uL (ref 4.0–10.5)
nRBC: 0 % (ref 0.0–0.2)

## 2024-10-21 MED ORDER — LEVETIRACETAM (KEPPRA) 500 MG/5 ML ADULT IV PUSH
1000.0000 mg | Freq: Two times a day (BID) | INTRAVENOUS | Status: DC
  Administered 2024-10-21 – 2024-10-24 (×6): 1000 mg via INTRAVENOUS
  Filled 2024-10-21 (×6): qty 10

## 2024-10-21 MED ORDER — DEXAMETHASONE SOD PHOSPHATE PF 10 MG/ML IJ SOLN
10.0000 mg | Freq: Once | INTRAMUSCULAR | Status: DC
Start: 2024-10-21 — End: 2024-10-21

## 2024-10-21 MED ORDER — DEXAMETHASONE SODIUM PHOSPHATE 4 MG/ML IJ SOLN
4.0000 mg | Freq: Four times a day (QID) | INTRAMUSCULAR | Status: DC
  Administered 2024-10-21 – 2024-10-23 (×7): 4 mg via INTRAVENOUS
  Filled 2024-10-21 (×7): qty 1

## 2024-10-21 NOTE — Procedures (Signed)
 Patient Name: [name]  MRN: [id number]  Epilepsy Attending: Arlin MALVA Krebs  Referring Physician/Provider: everitt Clint Abbey Earle FORBES, NP  Duration: 10/20/2024 1100 to 10/21/2024 1100   Patient history: 63yo M with ams. EEG to evaluate for seizure   Level of alertness: awake/lethargic   AEDs during EEG study: LEV   Technical aspects: This EEG study was done with scalp electrodes positioned according to the 10-20 International system of electrode placement. Electrical activity was reviewed with band pass filter of 1-70Hz , sensitivity of 7 uV/mm, display speed of 40mm/sec with a 60Hz  notched filter applied as appropriate. EEG data were recorded continuously and digitally stored.  Video monitoring was available and reviewed as appropriate.   Description: EEG showed continuous generalized and lateralized left hemisphere 3 to 6 Hz theta-delta slowing. Lateralized periodic discharges were noted in left hemisphere at 0.25hz  which gradually resolved. Hyperventilation and photic stimulation were not performed.      ABNORMALITY - Lateralized periodic discharges, left hemisphere  - Continuous slow,  generalized and lateralized left hemisphere   IMPRESSION: This study is showed evidence of epileptogenicity and cortical dysfunction arising from left hemisphere likely secondary to underlying structural abnormality and increased risk of seizure recurrence. Additionally there is generalized cerebral dysfunction (encephalopathy). No seizures were seen throughout the recording.   Rigo Letts O Callaway Hailes

## 2024-10-21 NOTE — Consult Note (Signed)
 NAME:  [name], MRN:  [id number], DOB:  27-Jun-1961, LOS: 4 ADMISSION DATE:  10/17/2024, CONSULTATION DATE:  10/19/24 REFERRING MD:  Michaela CHIEF COMPLAINT:  AMS  History of Present Illness:  Patient is a 63 year old male with significant past medical history arthritis, skin cancer of left arm, GERD, and small bowel obstruction in the past who presented to [facility], ED with complaints of generalized weakness and after a fall 10/17/2024.  Per report patient had been not feeling well/sick for over 3 weeks began with symptoms of upper respiratory tract infection-congestion, headaches, cough and some nausea/vomiting with diarrhea.  Patient was seen outpatient and prescribed doxycycline  on 11/7 and was compliant but unfortunately symptoms remained.  Within the emergency department, it appears that patient had worsening mental status and therefore MRI of head was obtained showing abnormal diffusion/FLAIR signal abnormality involved with the insular left cortex and inferior left frontal lobe and the mesial left temporal lobe.  Concerns for acute encephalitis were noted.  LP was completed with CSF WBC 60, RBCs 3200, protein 92, glucose 55, meningitis panel positive for HSV 1.  Patient was already receiving broad-spectrum and antibiotics as well as acyclovir.  Unfortunately patient began developing seizure activity on 11/14, neurology already following and recommended ICU transfer for airway protection as well as initiating AEDs-Ativan  1 mg given and Keppra was loaded.  PCCM consulted for ICU transfer.  Upon arrival the patient's room on 3 E., patient was obtunded and utilizing accessory muscle use as well as beginning to desaturate and not to manage secretions.  Wife at bedside.  Discussed with wife that patient will need to be transferred to ICU and urgently intubated.  Wife understood and consented to procedure verbally.  Pertinent  Medical History   Past Medical History:  Diagnosis Date    Allergy    Arthritis    Calcaneus fracture, left    Cancer (HCC)    Skin cancer Left Arm   GERD (gastroesophageal reflux disease)    Small bowel obstruction (HCC)      Significant Hospital Events: Including procedures, antibiotic start and stop dates in addition to other pertinent events   11/12 admit for altered mental status generalized weakness, fall 11/13 LP completed indicating HSV1 encephalitis 11/14 worsening altered mental status with seizure activity, started on AEDs-transferred to ICU for intubation due to unable to protect airway  Interim History / Subjective:  Intermittently febrile with Tmax 103 Minimal vent settings Family agreeable to trach and PEG. Team will arrange this week Objective    Blood pressure 108/72, pulse 81, temperature 99.8 F (37.7 C), temperature source Oral, resp. rate 20, height 5' 5 (1.651 m), weight 81.9 kg, SpO2 98%.    Vent Mode: PRVC FiO2 (%):  [40 %] 40 % Set Rate:  [20 bmp] 20 bmp Vt Set:  [490 mL] 490 mL PEEP:  [5 cmH20] 5 cmH20 Plateau Pressure:  [12 cmH20-17 cmH20] 17 cmH20   Intake/Output Summary (Last 24 hours) at 10/21/2024 0800 Last data filed at 10/21/2024 0600 Gross per 24 hour  Intake 3471.6 ml  Output 3270 ml  Net 201.6 ml   Filed Weights   10/17/24 1251 10/20/24 0358 10/21/24 0203  Weight: 80.7 kg 81.8 kg 81.9 kg   Physical Exam: General: Critically ill-appearing, no acute distress HENT: [place], AT, ETT in place Eyes: EOMI, no scleral icterus Respiratory: Clear to auscultation bilaterally.  No crackles, wheezing or rales Cardiovascular: RRR, -M/R/G, no JVD GI: BS+, soft, nontender Extremities:-Edema,-tenderness Neuro: Sedated, 3mm PERRL,  resists opening eyes but no withdrawal to noxious stimuli, +cough/gag GU: Foley in place  Physical Exam: General: Critically ill-appearing, sedated HENT: [place], AT, ETT in place Eyes: EOMI, no scleral icterus Respiratory: Clear to auscultation bilaterally.  No crackles, wheezing  or rales Cardiovascular: RRR, -M/R/G, no JVD GI: BS+, soft, nontender Extremities:-Edema,-tenderness Neuro: Sedated, PERRL, resists opening eyes but no withdrawal to noxious stimuli, +cough/gag GU: Foley in place   Bcx 11/12, CSFcx 11/13, UCx 11/12 NGTD  Resolved problem list   Assessment and Plan   HSV-1 Encephalitis with seizure Acute encephalopathy 2/2 above MRI with insular left cortex and inferior left frontal lobe and mesial left temporal lobe abnormality. Meningitis/encephalitis panel + HSV-1 - ID following: Acyclovir - Neurology following: PRN ativan , Keppra - cEEG: neg - Seizure precautions - Continue neuroprotective measures: Euvolemia, normoxia, normocapnia, normothermia, eunatremia - Will need to arrange trach and PEG this week or next week for expected prolonged recovery  Fevers: Likely secondary encephalitis with no evidence of other source of infection at this time Septic shock 2/2 encephalitis Sedation related hypotension Lactic acidosis - resolved - Wean levophed for MAP >65 - Wean sedation as able - Antibiotics as above  Acute hypoxemic respiratory failure - Full vent support - LTVV, 4-8cc/kg IBW with goal Pplat<30 and DP<15 - VAP - Hold SBT/WUA until critical illness resolves PAD protocol for RASS -1: Wean Precedex, Fentanyl   GERD P: Continue PPI  Depression  P: Hold sertraline    Critical care time: 38 min     The patient is critically ill with multiple organ systems failure and requires high complexity decision making for assessment and support, frequent evaluation and titration of therapies, application of advanced monitoring technologies and extensive interpretation of multiple databases.  Independent Critical Care Time: 38 Minutes.   Slater Staff, M.D. [facility] Pulmonary/Critical Care Medicine 10/21/2024 8:01 AM   Please see Amion for pager number to reach on-call Pulmonary and Critical Care Team.

## 2024-10-21 NOTE — Progress Notes (Signed)
 NEUROLOGY CONSULT FOLLOW UP NOTE   Date of service: October 21, 2024 Patient Name: [name] MRN:  [id number] DOB:  02/11/1961  Interval Hx/subjective   He appears to have developed some right-sided weakness  Vitals   Vitals:   10/21/24 1800 10/21/24 1815 10/21/24 1830 10/21/24 1908  BP: (!) 103/46 102/65 98/70   Pulse: 98 87 76   Resp: (!) 28 (!) 27 20   Temp:    100.1 F (37.8 C)  TempSrc:    Oral  SpO2: 99% 100% 99%   Weight:      Height:         Body mass index is 30.05 kg/m.  Physical Exam   Constitutional: Appears well-developed and well-nourished.  Neurologic Examination    MS: He opens eyes partially to voice, does not follow commands CN: Eyes are slightly disconjugate, pupils are reactive bilaterally, with increasing levels of arousal he does conjugate his eyes Motor: He withdraws to noxious stimulation on the left, I am unable to get him to move much on the right Sensory: As above  Medications  Current Facility-Administered Medications:    acetaminophen  (TYLENOL ) tablet 650 mg, 650 mg, Per Tube, Q6H PRN, 650 mg at 10/21/24 0341 **OR** acetaminophen  (TYLENOL ) suppository 650 mg, 650 mg, Rectal, Q6H PRN, [name], MD   acyclovir (ZOVIRAX) 800 mg in dextrose  5 % 250 mL IVPB, 800 mg, Intravenous, Q8H, [name], [name], MD, Stopped at 10/21/24 1504   Chlorhexidine  Gluconate Cloth 2 % PADS 6 each, 6 each, Topical, Daily, [name], [name], MD, 6 each at 10/21/24 1103   dexmedetomidine (PRECEDEX) 400 MCG/100ML (4 mcg/mL) infusion, 0-1.2 mcg/kg/hr, Intravenous, Titrated, Claudene Fonda BROCKS, NP, Last Rate: 10.09 mL/hr at 10/21/24 1910, 0.5 mcg/kg/hr at 10/21/24 1910   docusate (COLACE) 50 MG/5ML liquid 100 mg, 100 mg, Per Tube, BID, Smith, Joshua C, NP, 100 mg at 10/21/24 9074   enoxaparin  (LOVENOX ) injection 40 mg, 40 mg, Subcutaneous, Q24H, [name], MD, 40 mg at 10/21/24 1607   feeding supplement (VITAL AF 1.2 CAL) liquid 1,000 mL,  1,000 mL, Per Tube, Continuous, [name], [name], MD, Last Rate: 65 mL/hr at 10/21/24 1800, Infusion Verify at 10/21/24 1800   fentaNYL  (SUBLIMAZE ) bolus via infusion 25-100 mcg, 25-100 mcg, Intravenous, Q15 min PRN, Claudene Fonda C, NP   fentaNYL  in NS (3mcg/ml) infusion-PREMIX, 0-400 mcg/hr, Intravenous, Continuous, Claudene Fonda BROCKS, NP, Stopped at 10/21/24 0830   lactated ringers  infusion, , Intravenous, Continuous, [name], MD, Last Rate: 50 mL/hr at 10/21/24 1800, Infusion Verify at 10/21/24 1800   levETIRAcetam (KEPPRA) undiluted injection 1,000 mg, 1,000 mg, Intravenous, Q12H, Cornellius Kropp, Aisha SHAUNNA, MD   midazolam  PF (VERSED ) injection 1-2 mg, 1-2 mg, Intravenous, Q1H PRN, Smith, Joshua C, NP   norepinephrine (LEVOPHED) 4mg  in (0.016 mg/mL) premix infusion, 0-40 mcg/min, Intravenous, Titrated, Claudene Fonda C, NP, Last Rate: 7.5 mL/hr at 10/21/24 1800, 2 mcg/min at 10/21/24 1800   ondansetron  (ZOFRAN ) tablet 4 mg, 4 mg, Oral, Q6H PRN **OR** ondansetron  (ZOFRAN ) injection 4 mg, 4 mg, Intravenous, Q6H PRN, [name], [name], MD   Oral care mouth rinse, 15 mL, Mouth Rinse, Q2H, [name], [name], MD, 15 mL at 10/21/24 1909   Oral care mouth rinse, 15 mL, Mouth Rinse, PRN, [name], [name], MD   pantoprazole  (PROTONIX ) injection 40 mg, 40 mg, Intravenous, Q24H, [name], [name], MD, 40 mg at 10/21/24 1724   polyethylene glycol (MIRALAX  / GLYCOLAX ) packet 17 g, 17 g, Oral, Daily PRN,  [name], [name], MD   polyethylene glycol (MIRALAX  / GLYCOLAX ) packet 17 g, 17 g, Per Tube, Daily, Claudene Chew C, NP, 17 g at 10/21/24 0925   sodium chloride  flush (NS) 0.9 % injection 10-40 mL, 10-40 mL, Intracatheter, Q12H, [name], [name], MD, 10 mL at 10/21/24 0926   sodium chloride  flush (NS) 0.9 % injection 10-40 mL, 10-40 mL, Intracatheter, PRN, [name], MD  Labs and Diagnostic Imaging   CSF WBC 60 CSF RBC 3200(suspect mildly traumatic given  colorless supernatant) CSF protein 92 CSF glucose 55 CSF meningitis panel is negative  Creatinine 0.93   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  Will continue Keppra and continue to monitor.  His left hemisphere is much more affected than his right, and I suspect that his weakness is due to this, but I will get a CT scan to rule out stroke or hemorrhage.  He has significant irritability on his EEG and I will increase his Keppra to 1 g twice daily.  Recommendations  Increase Keppra to 1 g twice daily  continue EEG Will follow ______________________________________________________________________   Signed, Aisha Seals, MD [facility]

## 2024-10-21 NOTE — Progress Notes (Signed)
 SLP Cancellation Note  Patient Details Name: [name] MRN: [id number] DOB: 11-28-1961   Cancelled treatment:    Pt transferred to ICU and intubated. SLP will sign off and await new orders as appropriate.   Eavan Gonterman L. Vona, MA CCC/SLP Clinical Specialist - Acute Care SLP Acute Rehabilitation Services Office number [phone]        [name] 10/21/2024, 2:40 PM

## 2024-10-22 ENCOUNTER — Inpatient Hospital Stay (HOSPITAL_COMMUNITY)

## 2024-10-22 DIAGNOSIS — R569 Unspecified convulsions: Secondary | ICD-10-CM | POA: Diagnosis not present

## 2024-10-22 DIAGNOSIS — R6521 Severe sepsis with septic shock: Secondary | ICD-10-CM | POA: Diagnosis not present

## 2024-10-22 DIAGNOSIS — G936 Cerebral edema: Secondary | ICD-10-CM | POA: Diagnosis not present

## 2024-10-22 DIAGNOSIS — B1009 Other human herpesvirus encephalitis: Secondary | ICD-10-CM | POA: Diagnosis not present

## 2024-10-22 DIAGNOSIS — A419 Sepsis, unspecified organism: Secondary | ICD-10-CM | POA: Diagnosis not present

## 2024-10-22 DIAGNOSIS — K5900 Constipation, unspecified: Secondary | ICD-10-CM

## 2024-10-22 DIAGNOSIS — R509 Fever, unspecified: Secondary | ICD-10-CM

## 2024-10-22 DIAGNOSIS — B020 Zoster encephalitis: Secondary | ICD-10-CM | POA: Diagnosis not present

## 2024-10-22 DIAGNOSIS — R739 Hyperglycemia, unspecified: Secondary | ICD-10-CM

## 2024-10-22 DIAGNOSIS — B004 Herpesviral encephalitis: Secondary | ICD-10-CM | POA: Diagnosis not present

## 2024-10-22 DIAGNOSIS — G9340 Encephalopathy, unspecified: Secondary | ICD-10-CM | POA: Diagnosis not present

## 2024-10-22 LAB — CULTURE, BLOOD (ROUTINE X 2)
Culture: NO GROWTH
Culture: NO GROWTH
Special Requests: ADEQUATE

## 2024-10-22 LAB — CBC
HCT: 35.5 % — ABNORMAL LOW (ref 39.0–52.0)
Hemoglobin: 12.9 g/dL — ABNORMAL LOW (ref 13.0–17.0)
MCH: 33 pg (ref 26.0–34.0)
MCHC: 36.3 g/dL — ABNORMAL HIGH (ref 30.0–36.0)
MCV: 90.8 fL (ref 80.0–100.0)
Platelets: 192 K/uL (ref 150–400)
RBC: 3.91 MIL/uL — ABNORMAL LOW (ref 4.22–5.81)
RDW: 13.1 % (ref 11.5–15.5)
WBC: 6.9 K/uL (ref 4.0–10.5)
nRBC: 0 % (ref 0.0–0.2)

## 2024-10-22 LAB — BASIC METABOLIC PANEL WITH GFR
Anion gap: 9 (ref 5–15)
BUN: 13 mg/dL (ref 8–23)
CO2: 25 mmol/L (ref 22–32)
Calcium: 8.3 mg/dL — ABNORMAL LOW (ref 8.9–10.3)
Chloride: 98 mmol/L (ref 98–111)
Creatinine, Ser: 0.71 mg/dL (ref 0.61–1.24)
GFR, Estimated: >60 mL/min (ref >60)
Glucose, Bld: 146 mg/dL — ABNORMAL HIGH (ref 70–99)
Potassium: 4.6 mmol/L (ref 3.5–5.1)
Sodium: 132 mmol/L — ABNORMAL LOW (ref 135–145)

## 2024-10-22 LAB — GLUCOSE, CAPILLARY
Glucose-Capillary: 189 mg/dL — ABNORMAL HIGH (ref 70–99)
Glucose-Capillary: 203 mg/dL — ABNORMAL HIGH (ref 70–99)
Glucose-Capillary: 195 mg/dL — ABNORMAL HIGH (ref 70–99)
Glucose-Capillary: 192 mg/dL — ABNORMAL HIGH (ref 70–99)
Glucose-Capillary: 161 mg/dL — ABNORMAL HIGH (ref 70–99)

## 2024-10-22 LAB — MRSA NEXT GEN BY PCR, NASAL: MRSA by PCR Next Gen: NOT DETECTED

## 2024-10-22 LAB — MAGNESIUM: Magnesium: 2.2 mg/dL (ref 1.7–2.4)

## 2024-10-22 LAB — PHOSPHORUS: Phosphorus: 2.4 mg/dL — ABNORMAL LOW (ref 2.5–4.6)

## 2024-10-22 MED ORDER — LINEZOLID 600 MG/300ML IV SOLN
600.0000 mg | Freq: Two times a day (BID) | INTRAVENOUS | Status: DC
  Administered 2024-10-22: 600 mg via INTRAVENOUS
  Filled 2024-10-22 (×2): qty 300

## 2024-10-22 MED ORDER — PIPERACILLIN-TAZOBACTAM 3.375 G IVPB
3.3750 g | Freq: Three times a day (TID) | INTRAVENOUS | Status: AC
  Administered 2024-10-22 – 2024-10-24 (×8): 3.375 g via INTRAVENOUS
  Filled 2024-10-22 (×7): qty 50

## 2024-10-22 MED ORDER — SENNOSIDES-DOCUSATE SODIUM 8.6-50 MG PO TABS
2.0000 | ORAL_TABLET | Freq: Two times a day (BID) | ORAL | Status: DC
  Administered 2024-10-22 – 2024-10-23 (×3): 2
  Filled 2024-10-22 (×3): qty 2

## 2024-10-22 MED ORDER — INSULIN GLARGINE-YFGN 100 UNIT/ML ~~LOC~~ SOLN
8.0000 [IU] | Freq: Every day | SUBCUTANEOUS | Status: DC
  Administered 2024-10-22 – 2024-10-23 (×2): 8 [IU] via SUBCUTANEOUS
  Filled 2024-10-22 (×2): qty 0.08

## 2024-10-22 MED ORDER — SODIUM PHOSPHATES 45 MMOLE/15ML IV SOLN
15.0000 mmol | Freq: Once | INTRAVENOUS | Status: AC
  Administered 2024-10-22: 15 mmol via INTRAVENOUS
  Filled 2024-10-22: qty 5

## 2024-10-22 MED ORDER — INSULIN ASPART 100 UNIT/ML IJ SOLN
0.0000 [IU] | INTRAMUSCULAR | Status: DC
  Administered 2024-10-22 – 2024-10-23 (×6): 3 [IU] via SUBCUTANEOUS
  Administered 2024-10-23 – 2024-10-24 (×5): 2 [IU] via SUBCUTANEOUS
  Filled 2024-10-22: qty 2
  Filled 2024-10-22: qty 3
  Filled 2024-10-22: qty 2
  Filled 2024-10-22: qty 5
  Filled 2024-10-22 (×3): qty 3

## 2024-10-22 MED ORDER — POLYETHYLENE GLYCOL 3350 17 G PO PACK
17.0000 g | PACK | Freq: Two times a day (BID) | ORAL | Status: DC
  Administered 2024-10-22 – 2024-10-23 (×2): 17 g
  Filled 2024-10-22 (×2): qty 1

## 2024-10-22 MED ORDER — SODIUM CHLORIDE 0.9 % IV SOLN: INTRAVENOUS | Status: DC

## 2024-10-22 NOTE — Procedures (Signed)
 Patient Name: [name]  MRN: [id number]  Epilepsy Attending: Arlin MALVA Krebs  Referring Physician/Provider: everitt Clint Abbey Earle FORBES, NP  Duration: 10/21/2024 1100 to 10/22/2024 1100   Patient history: 63yo M with ams. EEG to evaluate for seizure   Level of alertness: awake/lethargic   AEDs during EEG study: LEV   Technical aspects: This EEG study was done with scalp electrodes positioned according to the 10-20 International system of electrode placement. Electrical activity was reviewed with band pass filter of 1-70Hz , sensitivity of 7 uV/mm, display speed of 12mm/sec with a 60Hz  notched filter applied as appropriate. EEG data were recorded continuously and digitally stored.  Video monitoring was available and reviewed as appropriate.   Description: EEG showed continuous generalized and lateralized left hemisphere 3 to 6 Hz theta-delta slowing. Hyperventilation and photic stimulation were not performed.      ABNORMALITY - Continuous slow, generalized and lateralized left hemisphere   IMPRESSION: This study is showed cortical dysfunction arising from left hemisphere likely secondary to underlying structural abnormality. Additionally there is generalized cerebral dysfunction (encephalopathy). No seizures were seen throughout the recording.   Raine Blodgett O Moncerrath Berhe

## 2024-10-22 NOTE — Progress Notes (Signed)
 eLink Physician-Brief Progress Note Patient Name: [name] DOB: 1961-02-17 MRN: [id number]   Date of Service  10/22/2024  HPI/Events of Note  Foley bag is actively leaking, cannot break the seal  eICU Interventions  Replace Foley rather than just replacing the bag per institutional policy     Intervention Category Minor Interventions: Routine modifications to care plan (e.g. PRN medications for pain, fever)  Marcea Rojek 10/22/2024, 12:00 AM

## 2024-10-22 NOTE — Progress Notes (Addendum)
 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 Primary team had started HAP coverage after 2 days of admission. Trach cx sent 11/17 in process. Mrsa nares pcr negative. Afebrile/no leukocytosis; minimal vent requirement   PLAN: Stopped linezolid Suspect seizure/hsv encephalitis with respiratory failure If treatment for cap/hap planned would do no more than 5 days -- finish this Wednesday Continue acyclovir Agree with neurology role of steroid is ambivalent but given severe ams/edema reasonable to try. 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)   Seizure (HCC)   Allergies  Allergen Reactions   Morphine And Codeine Swelling   Bee Venom Rash    Scheduled Meds:  Chlorhexidine  Gluconate Cloth  6 each Topical Daily   dexamethasone  (DECADRON ) injection  4 mg Intravenous Q6H   enoxaparin  (LOVENOX ) injection  40 mg Subcutaneous Q24H   insulin  aspart  0-15 Units Subcutaneous Q4H   insulin  glargine-yfgn  8 Units Subcutaneous Daily   levETIRAcetam   1,000 mg Intravenous Q12H   mouth rinse  15 mL Mouth Rinse Q2H   pantoprazole  (PROTONIX ) IV  40 mg Intravenous Q24H   polyethylene glycol  17 g Per Tube BID   senna-docusate  2 tablet Per Tube BID   sodium chloride  flush  10-40 mL Intracatheter Q12H   Continuous Infusions:  sodium chloride  50 mL/hr at 10/22/24 1429   acyclovir 800 mg (10/22/24 1431)   dexmedetomidine (PRECEDEX) IV infusion Stopped (10/22/24 0915)   feeding supplement (VITAL AF 1.2 CAL) 65 mL/hr at 10/22/24 1300   fentaNYL  infusion INTRAVENOUS Stopped (10/22/24 0915)   linezolid (ZYVOX) IV Stopped (10/22/24 1213)   norepinephrine (LEVOPHED) Adult infusion 1 mcg/min (10/22/24 1300)   piperacillin-tazobactam (ZOSYN)  IV 3.375 g (10/22/24 1215)   sodium PHOSPHATE  IVPB (in mmol) 15 mmol (10/22/24 1214)   PRN Meds:.acetaminophen  **OR** acetaminophen , fentaNYL , midazolam  PF, ondansetron  **OR** ondansetron  (ZOFRAN ) IV, mouth rinse, polyethylene glycol, sodium chloride  flush   SUBJECTIVE: Family said patient moving extremities and open eyes Primary team have patient on hap coverage Cxr reviewed/cleared Afebrile No leukocytosis On continuous eeg   Review of Systems: ROS All other ROS was negative, except mentioned above     OBJECTIVE: Vitals:   10/22/24 1300 10/22/24 1315 10/22/24 1330 10/22/24 1345  BP: 109/74 130/82 110/71 119/74  Pulse: (!) 56 65 60 (!) 57  Resp: (!) 21 19 (!) 25 (!) 24  Temp:      TempSrc:      SpO2: 96% 99% 97% 97%  Weight:      Height:       Body mass index is 29.57  kg/m.  Physical Exam General/constitutional: ill appearing; intubated HEENT: Normocephalic, PER, Conj Clear, EOMI, Oropharynx clear Neck supple CV: rrr no mrg Lungs: clear to auscultation, normal respiratory effort Abd: Soft, Nontender Ext: no edema Skin: No Rash Neuro: nonfocal MSK: no peripheral joint swelling/tenderness/warmth; back spines nontender   Central line presence: left upper ext picc site no  bleeding/erythema/purulence   Lab Results Lab Results  Component Value Date   WBC 6.9 10/22/2024   HGB 12.9 (L) 10/22/2024   HCT 35.5 (L) 10/22/2024   MCV 90.8 10/22/2024   PLT 192 10/22/2024    Lab Results  Component Value Date   CREATININE 0.71 10/22/2024   BUN 13 10/22/2024   NA 132 (L) 10/22/2024   K 4.6 10/22/2024   CL 98 10/22/2024   CO2 25 10/22/2024    Lab Results  Component Value Date   ALT 37 10/17/2024   AST 38 10/17/2024   ALKPHOS 43 10/17/2024   BILITOT 1.2 10/17/2024      Microbiology: Recent Results (from the past 240 hours)  Resp panel by RT-PCR (RSV, Flu A&B, Covid) Anterior Nasal Swab     Status: None   Collection Time: 10/17/24 12:49 PM   Specimen: Anterior Nasal Swab  Result Value Ref Range Status   SARS Coronavirus 2 by RT PCR NEGATIVE NEGATIVE Final    Comment: (NOTE) SARS-CoV-2 target nucleic acids are NOT DETECTED.  The SARS-CoV-2 RNA is generally detectable in upper respiratory specimens during the acute phase of infection. The lowest concentration of SARS-CoV-2 viral copies this assay can detect is 138 copies/mL. A negative result does not preclude SARS-Cov-2 infection and should not be used as the sole basis for treatment or other patient management decisions. A negative result may occur with  improper specimen collection/handling, submission of specimen other than nasopharyngeal swab, presence of viral mutation(s) within the areas targeted by this assay, and inadequate number of viral copies(<138 copies/mL). A negative result must be combined with clinical observations, patient history, and epidemiological information. The expected result is Negative.  Fact Sheet for Patients:  bloggercourse.com  Fact Sheet for Healthcare Providers:  seriousbroker.it  This test is no t yet approved or cleared by the [place]  FDA and  has been authorized for detection and/or diagnosis of  SARS-CoV-2 by FDA under an Emergency Use Authorization (EUA). This EUA will remain  in effect (meaning this test can be used) for the duration of the COVID-19 declaration under Section 564(b)(1) of the Act, 21 U.S.C.section 360bbb-3(b)(1), unless the authorization is terminated  or revoked sooner.       Influenza A by PCR NEGATIVE NEGATIVE Final   Influenza B by PCR NEGATIVE NEGATIVE Final    Comment: (NOTE) The Xpert Xpress SARS-CoV-2/FLU/RSV plus assay is intended as an aid in the diagnosis of influenza from Nasopharyngeal swab specimens and should not be used as a sole basis for treatment. Nasal washings and aspirates are unacceptable for Xpert Xpress SARS-CoV-2/FLU/RSV testing.  Fact Sheet for Patients: bloggercourse.com  Fact Sheet for Healthcare Providers: seriousbroker.it  This test is not yet approved or cleared by the [place]  FDA and has been authorized for detection and/or diagnosis of SARS-CoV-2 by FDA under an Emergency Use Authorization (EUA). This EUA will remain in effect (meaning this test can be used) for the duration of the COVID-19 declaration under Section 564(b)(1) of the Act, 21 U.S.C. section 360bbb-3(b)(1), unless the authorization is terminated or revoked.     Resp Syncytial Virus by PCR NEGATIVE NEGATIVE Final  Comment: (NOTE) Fact Sheet for Patients: bloggercourse.com  Fact Sheet for Healthcare Providers: seriousbroker.it  This test is not yet approved or cleared by the [place]  FDA and has been authorized for detection and/or diagnosis of SARS-CoV-2 by FDA under an Emergency Use Authorization (EUA). This EUA will remain in effect (meaning this test can be used) for the duration of the COVID-19 declaration under Section 564(b)(1) of the Act, 21 U.S.C. section 360bbb-3(b)(1), unless the authorization is terminated  or revoked.  Performed at [facility], [address], [place], [place] [address]   Blood Culture (routine x 2)     Status: None   Collection Time: 10/17/24  1:20 PM   Specimen: BLOOD  Result Value Ref Range Status   Specimen Description BLOOD BLOOD RIGHT HAND  Final   Special Requests   Final    BOTTLES DRAWN AEROBIC AND ANAEROBIC Blood Culture results may not be optimal due to an inadequate volume of blood received in culture bottles   Culture   Final    NO GROWTH 5 DAYS Performed at [facility], [address] [place], [place] [address]    Report Status 10/22/2024 FINAL  Final  Blood Culture (routine x 2)     Status: None   Collection Time: 10/17/24  1:29 PM   Specimen: BLOOD  Result Value Ref Range Status   Specimen Description BLOOD BLOOD RIGHT ARM  Final   Special Requests   Final    BOTTLES DRAWN AEROBIC AND ANAEROBIC Blood Culture adequate volume   Culture   Final    NO GROWTH 5 DAYS Performed at [facility], [address] [place], [place] [address]    Report Status 10/22/2024 FINAL  Final  Urine Culture     Status: None   Collection Time: 10/17/24  5:13 PM   Specimen: Urine, Catheterized  Result Value Ref Range Status   Specimen Description   Final    URINE, CATHETERIZED Performed at [facility], [address], [place], [place] [address]    Special Requests   Final    NONE Performed at [facility], [address] [place], [place] [address]    Culture   Final    NO GROWTH Performed at [facility], [address] [place], [place] [address]    Report Status 10/19/2024 FINAL  Final  CSF culture w Gram Stain     Status: None   Collection Time: 10/18/24  1:36 PM   Specimen: PATH Cytology CSF; Cerebrospinal Fluid  Result Value Ref Range Status   Specimen Description CSF  Final   Special Requests NONE  Final   Gram Stain NO WBC SEEN NO ORGANISMS SEEN   Final   Culture   Final    NO GROWTH 3 DAYS Performed at [facility], [address] [place], [place] [address]    Report Status 10/21/2024 FINAL  Final  MRSA Next Gen by PCR, Nasal     Status: None   Collection Time: 10/19/24  1:13 PM   Specimen: Nasal Mucosa; Nasal Swab  Result Value Ref Range Status   MRSA by PCR Next Gen NOT DETECTED NOT DETECTED Final    Comment: (NOTE) The GeneXpert MRSA Assay (FDA approved for NASAL specimens only), is one component of a comprehensive MRSA colonization surveillance program. It is not intended to diagnose MRSA infection nor to guide or monitor treatment for MRSA infections. Test performance is not FDA approved in patients less than 14 years old.  Performed at [facility], [address], [place], [place] [address]   MRSA Next Gen by PCR, Nasal     Status: None   Collection Time: 10/22/24 10:04 AM   Specimen: Nasal Mucosa; Nasal Swab  Result Value Ref Range Status   MRSA by PCR Next Gen NOT DETECTED NOT DETECTED Final    Comment: (NOTE) The GeneXpert MRSA Assay (FDA approved for NASAL specimens only), is one component of a comprehensive MRSA colonization surveillance program. It is not intended to diagnose MRSA infection nor to guide or monitor treatment for MRSA infections. Test performance is not FDA approved in patients less than 7 years old. Performed at [facility], [address], [place], [place] [address]      Serology:   Imaging: If present, new imagings (plain films, ct scans, and mri) have been personally visualized and interpreted; radiology reports have been reviewed. Decision making incorporated into the Impression / Recommendations.  11/17 cxr No official report yet. No obvious pulm opacity from my read   11/13 mri brain 1. No acute intracranial abnormality. 2. Increased T2 signal within the left mesial temporal lobe, without abnormal enhancement. Clinical correlation and follow up to ensure resolution is recommended.   Constance ONEIDA Passer, MD [facility] [phone] pager    10/22/2024, 2:36 PM

## 2024-10-22 NOTE — Progress Notes (Signed)
 NAME:  [name], MRN:  [id number], DOB:  Aug 27, 1961, LOS: 5 ADMISSION DATE:  10/17/2024, CONSULTATION DATE:  10/19/24 REFERRING MD:  Michaela CHIEF COMPLAINT:  AMS  History of Present Illness:  Patient is a 63 year old male with significant past medical history arthritis, skin cancer of left arm, GERD, and small bowel obstruction in the past who presented to [facility], ED with complaints of generalized weakness and after a fall 10/17/2024.  Per report patient had been not feeling well/sick for over 3 weeks began with symptoms of upper respiratory tract infection-congestion, headaches, cough and some nausea/vomiting with diarrhea.  Patient was seen outpatient and prescribed doxycycline  on 11/7 and was compliant but unfortunately symptoms remained.  Within the emergency department, it appears that patient had worsening mental status and therefore MRI of head was obtained showing abnormal diffusion/FLAIR signal abnormality involved with the insular left cortex and inferior left frontal lobe and the mesial left temporal lobe.  Concerns for acute encephalitis were noted.  LP was completed with CSF WBC 60, RBCs 3200, protein 92, glucose 55, meningitis panel positive for HSV 1.  Patient was already receiving broad-spectrum and antibiotics as well as acyclovir.  Unfortunately patient began developing seizure activity on 11/14, neurology already following and recommended ICU transfer for airway protection as well as initiating AEDs-Ativan  1 mg given and Keppra was loaded.  PCCM consulted for ICU transfer.  Upon arrival the patient's room on 3 E., patient was obtunded and utilizing accessory muscle use as well as beginning to desaturate and not to manage secretions.  Wife at bedside.  Discussed with wife that patient will need to be transferred to ICU and urgently intubated.  Wife understood and consented to procedure verbally.  Pertinent  Medical History   Past Medical History:  Diagnosis Date    Allergy    Arthritis    Calcaneus fracture, left    Cancer (HCC)    Skin cancer Left Arm   GERD (gastroesophageal reflux disease)    Small bowel obstruction (HCC)      Significant Hospital Events: Including procedures, antibiotic start and stop dates in addition to other pertinent events   11/12 admit for altered mental status generalized weakness, fall 11/13 LP completed indicating HSV1 encephalitis 11/14 worsening altered mental status with seizure activity, started on AEDs-transferred to ICU for intubation due to unable to protect airway  Interim History / Subjective:  Remains on precedex, fentanyl , NE.  Steroids started overnight for progressive edema seen on CT.   Objective    Blood pressure 107/69, pulse (!) 58, temperature 98.7 F (37.1 C), temperature source Oral, resp. rate (!) 22, height 5' 5 (1.651 m), weight 80.6 kg, SpO2 98%.    Vent Mode: PRVC FiO2 (%):  [40 %] 40 % Set Rate:  [20 bmp] 20 bmp Vt Set:  [490 mL] 490 mL PEEP:  [5 cmH20] 5 cmH20 Plateau Pressure:  [16 cmH20-18 cmH20] 16 cmH20   Intake/Output Summary (Last 24 hours) at 10/22/2024 0723 Last data filed at 10/22/2024 0700 Gross per 24 hour  Intake 3667.29 ml  Output 2190 ml  Net 1477.29 ml   Filed Weights   10/20/24 0358 10/21/24 0203 10/22/24 0341  Weight: 81.8 kg 81.9 kg 80.6 kg   Physical Exam: General: critically ill appearing man lying in bed in NAD HENT: [place]/AT, eyes anicteric, ETT Respiratory: breathing comfortably on MV, thick tan secretions from ETT Cardiovascular: s1S2, RRR GI: soft, NT Extremities: no edema or cyanosis Neuro:  occasionally moving left foot,  not withdrawing from pain. RASS -5.   Na+  132 BUN 13 Cr 0.71 WBC 6.9 H/H 12.9/35.5 Platelets 192 BG up to 200s  CXR personally reviewed> silhouetted retrocardiac L hemidiaphragm  Resolved problem list   Lactic acidosis - resolved  Assessment and Plan   HSV-1 Encephalitis with seizure Acute encephalopathy 2/2  above MRI with insular left cortex and inferior left frontal lobe and mesial left temporal lobe abnormality; edema worse on overnight CT. - con't acyclovir -steroids started by Neuro overnight for swelling; d/w Neuro and ID today. Risks seems less than potential benefit with worse edema.  -cEEG -keppra; ativan  PRN -seizure precautions -aggressively treat fevers -Address hyponatremia; want to avoid free water . Switch LR with acyclovir to NS.  -Pending recovery and goals of care discussions, will need to arrange trach and PEG this week or next week for expected prolonged recovery  Fevers- worry about aspiration pneumonia Septic shock 2/2 encephalitis Sedation related hypotension -NE to maintain MAP> 65 -start HAP antibiotics, trach aspirate culture, MRSA nares -acyclovir  Acute hypoxemic respiratory failure requiring MV -LTVV -VAP prevention protocol -PAD protocol for sedation -daily SAT & SBT as appropriate  GERD -PPI  Depression  -sertraline   Hypergylcemia -adding semglee 8 units daily with steroids -SSI PRN -check A1c  Hypophosphatemia -repleted -trend  Constipation -senna, miralax  added  Wife updated at bedside this afternoon.   Critical care time:      This patient is critically ill with multiple organ system failure which requires frequent high complexity decision making, assessment, support, evaluation, and titration of therapies. This was completed through the application of advanced monitoring technologies and extensive interpretation of multiple databases. During this encounter critical care time was devoted to patient care services described in this note for 40 minutes.  Leita SHAUNNA Gaskins, DO 10/22/24 10:05 AM [facility]  For contact information, see Amion. If no response to pager, please call PCCM consult pager. After hours, 7PM- 7AM, please call Elink.

## 2024-10-22 NOTE — Plan of Care (Signed)
 Late entry note:   Cth overnight with   1. 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 Areas of intermixed mild hyperdensity is suspicious for non-mass occupying acute hemorrhage. Repeat MRI with contrast could further characterize if clinically warranted.   Given the extent of vasogenic edema noted on CT, will start decadron  4mg  Q6H.  Rosalind Guido [facility]

## 2024-10-22 NOTE — Plan of Care (Signed)

## 2024-10-22 NOTE — Progress Notes (Signed)
 PT Cancellation Note  Patient Details Name: [name] MRN: [id number] DOB: Sep 01, 1961   Cancelled Treatment:    Reason Eval/Treat Not Completed: Patient not medically ready. Pt remains intubated and sedated. Third attempt. Per department policy, will sign off and anticipate orders when pt is medically ready.  Isaiah DEL. Debbe Crumble, PT, DPT   [employer] 10/22/2024, 9:41 AM

## 2024-10-22 NOTE — Progress Notes (Signed)
 OT Cancellation Note  Patient Details Name: YOSKAR MURRILLO MRN: [id number] DOB: July 22, 1961   Cancelled Treatment:    Reason Eval/Treat Not Completed: Patient not medically ready;Medical issues which prohibited therapy Pt remains intubated and sedated. Will sign off for acute OT. Please reconsult when pt ready for PT/OT evals.  [name] Fish 10/22/2024, 9:22 AM

## 2024-10-22 NOTE — Progress Notes (Signed)
 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  Physical Exam   Constitutional: Appears well-developed and well-nourished.  Neurologic Examination    MS: He opens eyes partially to voice, does not follow commands CN: Eyes are slightly disconjugate, pupils are reactive bilaterally, with increasing levels of arousal he does conjugate his eyes Motor: He withdraws to noxious stimulation on the left, much less so on R but later improved from yesterday Sensory: As above  Medications  Current Facility-Administered Medications:    0.9 %  sodium chloride  infusion, , Intravenous, Continuous, Clark, Laura P, DO   acetaminophen  (TYLENOL ) tablet 650 mg, 650 mg, Per Tube, Q6H PRN, 650 mg at 10/21/24 0341 **OR** acetaminophen  (TYLENOL ) suppository 650 mg, 650 mg, Rectal, Q6H PRN, [name], MD   acyclovir (ZOVIRAX) 800 mg in dextrose  5 % 250 mL IVPB, 800 mg, Intravenous, Q8H, [name], [name], MD, Stopped at 10/22/24 9374   Chlorhexidine  Gluconate Cloth 2 % PADS 6 each, 6 each, Topical, Daily, [name], [name], MD, 6 each at 10/22/24 0917   dexamethasone  (DECADRON ) injection 4 mg, 4 mg, Intravenous, Q6H, Khaliqdina, Salman, MD, 4 mg at 10/22/24 1208   dexmedetomidine (PRECEDEX) 400 MCG/100ML (4 mcg/mL) infusion, 0-1.2 mcg/kg/hr, Intravenous, Titrated, Claudene Fonda BROCKS, NP, Stopped at 10/22/24 0915   enoxaparin  (LOVENOX )  injection 40 mg, 40 mg, Subcutaneous, Q24H, [name], MD, 40 mg at 10/21/24 1607   feeding supplement (VITAL AF 1.2 CAL) liquid 1,000 mL, 1,000 mL, Per Tube, Continuous, [name], [name], MD, Last Rate: 65 mL/hr at 10/22/24 1000, Infusion Verify at 10/22/24 1000   fentaNYL  (SUBLIMAZE ) bolus via infusion 25-100 mcg, 25-100 mcg, Intravenous, Q15 min PRN, Claudene Fonda C, NP   fentaNYL  in NS (56mcg/ml) infusion-PREMIX, 0-400 mcg/hr, Intravenous, Continuous, Claudene Fonda BROCKS, NP, Stopped at 10/22/24 0915   insulin  aspart (novoLOG ) injection 0-15 Units, 0-15 Units, Subcutaneous, Q4H, [name], DO, 3 Units at 10/22/24 1207   insulin  glargine-yfgn (SEMGLEE) injection 8 Units, 8 Units, Subcutaneous, Daily, [name], DO, 8 Units at 10/22/24 1207   levETIRAcetam (KEPPRA) undiluted injection 1,000 mg, 1,000 mg, Intravenous, Q12H, Michaela Aisha SHAUNNA, MD, 1,000 mg at 10/22/24 0803   linezolid (ZYVOX) IVPB 600 mg, 600 mg, Intravenous, Q12H, [name], DO, Last Rate: 300 mL/hr at 10/22/24 1113, 600 mg at 10/22/24 1113   midazolam  PF (VERSED ) injection 1-2 mg, 1-2 mg, Intravenous, Q1H PRN, Smith, Joshua C, NP   norepinephrine (LEVOPHED) 4mg  in (0.016 mg/mL) premix infusion, 0-40 mcg/min, Intravenous, Titrated, Claudene Fonda C, NP, Last Rate: 7.5 mL/hr at 10/22/24 1000, 2 mcg/min at 10/22/24 1000   ondansetron  (ZOFRAN ) tablet 4 mg, 4 mg, Oral, Q6H PRN **OR** ondansetron  (ZOFRAN ) injection 4 mg, 4 mg, Intravenous, Q6H PRN, [name],  [name], MD   Oral care mouth rinse, 15 mL, Mouth Rinse, Q2H, [name], [name], MD, 15 mL at 10/22/24 1113   Oral care mouth rinse, 15 mL, Mouth Rinse, PRN, [name], [name], MD   pantoprazole  (PROTONIX ) injection 40 mg, 40 mg, Intravenous, Q24H, [name], [name], MD, 40 mg at 10/21/24 1724   piperacillin-tazobactam (ZOSYN) IVPB 3.375 g, 3.375 g, Intravenous, Q8H, Dang, Thuy D, RPH, Last Rate: 12.5 mL/hr at 10/22/24 1215, 3.375 g at  10/22/24 1215   polyethylene glycol (MIRALAX  / GLYCOLAX ) packet 17 g, 17 g, Oral, Daily PRN, [name], [name], MD   polyethylene glycol (MIRALAX  / GLYCOLAX ) packet 17 g, 17 g, Per Tube, BID, Gretta, Laura P, DO   senna-docusate (Senokot-S) tablet 2 tablet, 2 tablet, Per Tube, BID, Gretta Doffing P, DO   sodium chloride  flush (NS) 0.9 % injection 10-40 mL, 10-40 mL, Intracatheter, Q12H, [name], [name], MD, 10 mL at 10/22/24 0917   sodium chloride  flush (NS) 0.9 % injection 10-40 mL, 10-40 mL, Intracatheter, PRN, [name], MD   sodium phosphate  15 mmol in sodium chloride  0.9 % 250 mL infusion, 15 mmol, Intravenous, Once, Gretta Doffing SQUIBB, DO, Last Rate: 43 mL/hr at 10/22/24 1214, 15 mmol at 10/22/24 1214  Labs and Diagnostic Imaging   CSF WBC 60 CSF RBC 3200(suspect mildly traumatic given colorless supernatant) CSF protein 92 CSF glucose 55 CSF meningitis panel (+) for HSV1  Creatinine 0.93  MRI brain wwo 11/12 Abnormal diffusion and FLAIR signal abnormality involving the left insular cortex and inferior left frontal lobe, as well as the mesial left temporal lobe compatible with HSV encephalitis given CSF results  Head CT wo 11/16 1. 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 Areas of intermixed mild hyperdensity is suspicious for non-mass occupying acute hemorrhage. Repeat MRI with contrast could further characterize if clinically warranted.   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  Keppra increased to 1g bid on 11/16 2/2 continued irritability on EEG.  Weakness 2/2 marked interval progression of L frontotemporal  > R frontal region vasogenic edema compatible with progressive HSV encephalitis despite acyclovir. Decadron  started. Weak evidence to support decadron  is HSV encephalitis in general but given degree of cerebral edema   Recommendations  Keppra 1g q 12 hrs Continue LTM EEG No indication for repeat MRI at this time Appreciate ID consults continuing to follow and provide guidance Continue decadron  4mg  IV q 6 hrs for now, tentative plan for 1 wk rapid taper Will continue to follow   This patient is critically ill and at significant risk of neurological worsening, death and care requires constant monitoring of vital signs, hemodynamics,respiratory and cardiac monitoring, neurological assessment, discussion with family, other specialists and medical decision making of high complexity. I spent 55 minutes of neurocritical care time  in the care of  this patient. This was time spent independent of any time provided by nurse practitioner or PA.  Elida Ross, MD [facility] [phone]  If 7pm- 7am, please page neurology on call as listed in AMION.

## 2024-10-22 NOTE — Progress Notes (Signed)
 LTM maint complete - no skin breakdown under:  Fp1 Fp2 Serviced several leads

## 2024-10-22 NOTE — Progress Notes (Signed)
 Transported patient to C.T while patient was on the mechanical ventilator.

## 2024-10-23 ENCOUNTER — Inpatient Hospital Stay (HOSPITAL_COMMUNITY)

## 2024-10-23 ENCOUNTER — Encounter (HOSPITAL_COMMUNITY): Payer: Self-pay | Admitting: Internal Medicine

## 2024-10-23 DIAGNOSIS — R6521 Severe sepsis with septic shock: Secondary | ICD-10-CM | POA: Diagnosis not present

## 2024-10-23 DIAGNOSIS — B004 Herpesviral encephalitis: Secondary | ICD-10-CM | POA: Diagnosis not present

## 2024-10-23 DIAGNOSIS — R509 Fever, unspecified: Secondary | ICD-10-CM | POA: Diagnosis not present

## 2024-10-23 DIAGNOSIS — G9340 Encephalopathy, unspecified: Secondary | ICD-10-CM | POA: Diagnosis not present

## 2024-10-23 DIAGNOSIS — Z9911 Dependence on respirator [ventilator] status: Secondary | ICD-10-CM

## 2024-10-23 DIAGNOSIS — G936 Cerebral edema: Secondary | ICD-10-CM | POA: Diagnosis not present

## 2024-10-23 DIAGNOSIS — B020 Zoster encephalitis: Secondary | ICD-10-CM | POA: Diagnosis not present

## 2024-10-23 DIAGNOSIS — L8990 Pressure ulcer of unspecified site, unspecified stage: Secondary | ICD-10-CM | POA: Insufficient documentation

## 2024-10-23 DIAGNOSIS — B1009 Other human herpesvirus encephalitis: Secondary | ICD-10-CM | POA: Diagnosis not present

## 2024-10-23 DIAGNOSIS — R569 Unspecified convulsions: Secondary | ICD-10-CM | POA: Diagnosis not present

## 2024-10-23 DIAGNOSIS — A419 Sepsis, unspecified organism: Secondary | ICD-10-CM | POA: Diagnosis not present

## 2024-10-23 DIAGNOSIS — R4182 Altered mental status, unspecified: Secondary | ICD-10-CM | POA: Diagnosis not present

## 2024-10-23 LAB — GLUCOSE, CAPILLARY
Glucose-Capillary: 135 mg/dL — ABNORMAL HIGH (ref 70–99)
Glucose-Capillary: 148 mg/dL — ABNORMAL HIGH (ref 70–99)
Glucose-Capillary: 150 mg/dL — ABNORMAL HIGH (ref 70–99)
Glucose-Capillary: 156 mg/dL — ABNORMAL HIGH (ref 70–99)
Glucose-Capillary: 163 mg/dL — ABNORMAL HIGH (ref 70–99)
Glucose-Capillary: 181 mg/dL — ABNORMAL HIGH (ref 70–99)

## 2024-10-23 LAB — BASIC METABOLIC PANEL WITH GFR
Anion gap: 12 (ref 5–15)
BUN: 17 mg/dL (ref 8–23)
CO2: 23 mmol/L (ref 22–32)
Calcium: 8.8 mg/dL — ABNORMAL LOW (ref 8.9–10.3)
Chloride: 101 mmol/L (ref 98–111)
Creatinine, Ser: 0.68 mg/dL (ref 0.61–1.24)
GFR, Estimated: >60 mL/min (ref >60)
Glucose, Bld: 209 mg/dL — ABNORMAL HIGH (ref 70–99)
Potassium: 4.1 mmol/L (ref 3.5–5.1)
Sodium: 136 mmol/L (ref 135–145)

## 2024-10-23 LAB — HEMOGLOBIN A1C
Hgb A1c MFr Bld: 5.6 % (ref 4.8–5.6)
Mean Plasma Glucose: 114.02 mg/dL

## 2024-10-23 LAB — PHOSPHORUS: Phosphorus: 2.8 mg/dL (ref 2.5–4.6)

## 2024-10-23 MED ORDER — DEXAMETHASONE SODIUM PHOSPHATE 4 MG/ML IJ SOLN
2.0000 mg | Freq: Two times a day (BID) | INTRAMUSCULAR | Status: AC
  Administered 2024-10-27 – 2024-10-29 (×4): 2 mg via INTRAVENOUS
  Filled 2024-10-23 (×4): qty 0.5

## 2024-10-23 MED ORDER — DEXAMETHASONE SODIUM PHOSPHATE 4 MG/ML IJ SOLN
2.0000 mg | INTRAMUSCULAR | Status: DC
  Administered 2024-10-29: 2 mg via INTRAVENOUS
  Filled 2024-10-23 (×2): qty 0.5

## 2024-10-23 MED ORDER — DEXAMETHASONE SODIUM PHOSPHATE 4 MG/ML IJ SOLN
4.0000 mg | Freq: Three times a day (TID) | INTRAMUSCULAR | Status: AC
  Administered 2024-10-23 – 2024-10-25 (×6): 4 mg via INTRAVENOUS
  Filled 2024-10-23 (×6): qty 1

## 2024-10-23 MED ORDER — DEXAMETHASONE SODIUM PHOSPHATE 4 MG/ML IJ SOLN
4.0000 mg | Freq: Two times a day (BID) | INTRAMUSCULAR | Status: AC
  Administered 2024-10-25 – 2024-10-27 (×4): 4 mg via INTRAVENOUS
  Filled 2024-10-23 (×4): qty 1

## 2024-10-23 MED ORDER — OSMOLITE 1.5 CAL PO LIQD
1000.0000 mL | ORAL | Status: DC
  Administered 2024-10-23: 1000 mL
  Filled 2024-10-23 (×4): qty 1000

## 2024-10-23 MED ORDER — INSULIN ASPART 100 UNIT/ML IJ SOLN
3.0000 [IU] | INTRAMUSCULAR | Status: DC
  Administered 2024-10-23 – 2024-10-24 (×6): 3 [IU] via SUBCUTANEOUS
  Filled 2024-10-23 (×5): qty 3

## 2024-10-23 MED ORDER — INSULIN GLARGINE-YFGN 100 UNIT/ML ~~LOC~~ SOLN
12.0000 [IU] | Freq: Every day | SUBCUTANEOUS | Status: DC
  Administered 2024-10-24 – 2024-10-25 (×2): 12 [IU] via SUBCUTANEOUS
  Filled 2024-10-23 (×2): qty 0.12

## 2024-10-23 NOTE — Progress Notes (Signed)
 Pharmacy Antibiotic Note  [name] is a 63 y.o. male admitted on 10/17/2024 with encephalitis.  Pharmacy has been consulted for acyclovir dosing.  Patient is also on Zosyn for HAP.  Renal function stable, afebrile, WBC WNL.  Secretions improving.  Plan: Continue acyclovir 800mg  IV Q8H NS at 50 ml/hr Zosyn EID 3.375gm IV Q8H per MD Monitor renal fxn, clinical progress   Height: 5' 5 (165.1 cm) Weight: 81.2 kg (179 lb 0.2 oz) IBW/kg (Calculated) : 61.5  Temp (24hrs), Avg:98.3 F (36.8 C), Min:95.1 F (35.1 C), Max:100.8 F (38.2 C)  Recent Labs  Lab 10/17/24 1512 10/18/24 0658 10/18/24 1752 10/18/24 2014 10/19/24 1323 10/19/24 1847 10/19/24 2320 10/20/24 0500 10/21/24 0320 10/22/24 0540 10/23/24 0637  WBC  --  7.4  --   --   --  7.3  --  6.5 6.1 6.9  --   CREATININE  --  0.95 1.00  --   --  0.93  --  0.83 0.86 0.71 0.68  LATICACIDVEN 2.9*  --  1.8 2.2* 1.0  --  1.0  --   --   --   --     Estimated Creatinine Clearance: 95.2 mL/min (by C-G formula based on SCr of 0.68 mg/dL).    Allergies  Allergen Reactions   Bee Venom Rash   Morphine And Codeine Swelling   Azith 11/12  CRO 11/12  Amp 11/12 >> 11/13 Vanc 11/12 >> 11/13 Acyclovir 11/12 >> Zyvox x1 11/17 Zosyn 11/17 >>   CSF 11/13 + HSV1 11/12 BCx - negative 11/12 UCx - negative 11/13 CSF Gram stain - negative 11/14 MRSA PCR - negative 11/17 TA -   [name] D. Lendell, PharmD, BCPS, BCCCP 10/23/2024, 1:22 PM

## 2024-10-23 NOTE — Plan of Care (Signed)
  Problem: Education: Goal: Knowledge of General Education information will improve Description: Including pain rating scale, medication(s)/side effects and non-pharmacologic comfort measures Outcome: Progressing   Problem: Health Behavior/Discharge Planning: Goal: Ability to manage health-related needs will improve Outcome: Progressing   Problem: Clinical Measurements: Goal: Ability to maintain clinical measurements within normal limits will improve Outcome: Progressing Goal: Will remain free from infection Outcome: Progressing Goal: Diagnostic test results will improve Outcome: Progressing Goal: Respiratory complications will improve Outcome: Progressing Goal: Cardiovascular complication will be avoided Outcome: Progressing   Problem: Activity: Goal: Risk for activity intolerance will decrease Outcome: Progressing   Problem: Nutrition: Goal: Adequate nutrition will be maintained Outcome: Progressing   Problem: Coping: Goal: Level of anxiety will decrease Outcome: Progressing   Problem: Elimination: Goal: Will not experience complications related to bowel motility Outcome: Progressing Goal: Will not experience complications related to urinary retention Outcome: Progressing   Problem: Pain Managment: Goal: General experience of comfort will improve and/or be controlled Outcome: Progressing   Problem: Safety: Goal: Ability to remain free from injury will improve Outcome: Progressing   Problem: Skin Integrity: Goal: Risk for impaired skin integrity will decrease Outcome: Progressing   Problem: Medication: Goal: Risk for medication side effects will decrease Outcome: Progressing   Problem: Clinical Measurements: Goal: Complications related to the disease process, condition or treatment will be avoided or minimized Outcome: Progressing Goal: Diagnostic test results will improve Outcome: Progressing

## 2024-10-23 NOTE — Progress Notes (Signed)
 SLP Cancellation Note  Patient Details Name: [name] MRN: [id number] DOB: [birth year]   Cancelled treatment:       Reason Eval/Treat Not Completed: Fatigue/lethargy limiting ability to participate. Pt extubated earlier this date but RN reports cannot sustain alertness. She recommends that SLP hold evaluation until pt is more appropriate. Will continue following.    Damien Blumenthal, M.A., CCC-SLP Speech Language Pathology, Acute Rehabilitation Services  Secure Chat preferred [phone]  10/23/2024, 3:06 PM

## 2024-10-23 NOTE — Progress Notes (Signed)
 Nutrition Follow-up  DOCUMENTATION CODES:  Not applicable  INTERVENTION:  Adjust tube feeding via cortrak: Osmolite 1.5 at 50 ml/h (1200 ml per day) Prosource TF20 60 ml BID Provides 1960 kcal, 115 gm protein, 914 ml free water  daily  NUTRITION DIAGNOSIS:  Inadequate oral intake related to lethargy/confusion as evidenced by NPO status. - remains applicable   GOAL:  Patient will meet greater than or equal to 90% of their needs - progressing  MONITOR:  TF tolerance, Labs, I & O's  REASON FOR ASSESSMENT:  Consult Enteral/tube feeding initiation and management  ASSESSMENT:  Patient presented with weakness s/p fall, nausea/vomiting/diarrhea, persistent headache and was found to have acute encephalopathy likely encephalitis/meningitis with severe sepsis and respiratory failure. PMH significant for SBO, GERD, and arthritis.  11/12 - presented to ED, LP x 2 attempted in ED unsuccessfully 11/13 - LP in IR 11/14 - rapid response called, intubated and transferred to ICU, cortrak placed 11/18 - extubated  Pt resting in bed at the time of assessment. Just extubated to nasal canula. Kept eyes closed during assessment, EEG running. Cortrak still in place with TF infusing at goal. Noted that pt with elevated glucose, diabetes coordinators made recommendations for additional insulin  coverage this AM. HgbA1c normal, elevation in glucose likely related to steroids use.   Admit weight: 80.7 kg  Current weight: 81.2 kg    Intake/Output Summary (Last 24 hours) at 10/23/2024 1408 Last data filed at 10/23/2024 1400 Gross per 24 hour  Intake 4075.6 ml  Output 3615 ml  Net 460.6 ml  Net IO Since Admission: 2,837.02 mL [10/23/24 1408]  Drains/Lines: PICC double lumen UOP x 24 hours Cortrak  Nutritionally Relevant Medications: Scheduled Meds:  dexamethasone  injection  4 mg Intravenous Q6H   insulin  aspart  0-15 Units Subcutaneous Q4H   insulin  glargine-yfgn  8 Units Subcutaneous  Daily   levETIRAcetam  1,000 mg Intravenous Q12H   pantoprazole  IV  40 mg Intravenous Q24H   polyethylene glycol  17 g Per Tube BID   senna-docusate  2 tablet Per Tube BID   Continuous Infusions:  sodium chloride  50 mL/hr at 10/23/24 0700   acyclovir Stopped (10/23/24 9357)   feeding supplement (VITAL AF 1.2 CAL) 65 mL/hr at 10/23/24 0700   piperacillin-tazobactam (ZOSYN)  IV Stopped (10/23/24 0737)   PRN Meds: ondansetron , polyethylene glycol  Labs Reviewed: CBG ranges from 148-203 mg/dL over the last 24 hours HgbA1c 5.6%  NUTRITION - FOCUSED PHYSICAL EXAM: Flowsheet Row Most Recent Value  Orbital Region No depletion  Upper Arm Region Mild depletion  Thoracic and Lumbar Region No depletion  Buccal Region No depletion  Temple Region Mild depletion  Clavicle Bone Region No depletion  Clavicle and Acromion Bone Region No depletion  Scapular Bone Region No depletion  Dorsal Hand No depletion  Patellar Region Mild depletion  Anterior Thigh Region Mild depletion  Posterior Calf Region No depletion  Edema (RD Assessment) None  Hair Reviewed  Eyes Reviewed  Mouth Reviewed  Skin Reviewed  Nails Reviewed    Diet Order:   Diet Order             Diet NPO time specified  Diet effective now                   EDUCATION NEEDS:  Not appropriate for education at this time  Skin:  Skin Assessment: Reviewed RN Assessment  Last BM:  11/18 - type 6  Height:  Ht Readings from Last 1 Encounters:  10/17/24 5' 5 (1.651 m)    Weight:  Wt Readings from Last 1 Encounters:  10/23/24 81.2 kg    Ideal Body Weight:  62 kg  BMI:  Body mass index is 29.79 kg/m.  Estimated Nutritional Needs:  Kcal:  1900-2200 Protein:  100-115 g/d Fluid:  >/=1.9L/d    Vernell Lukes, RD, LDN, CNSC Registered Dietitian II Please reach out via secure chat

## 2024-10-23 NOTE — Inpatient Diabetes Management (Signed)
 Inpatient Diabetes Program Recommendations  AACE/ADA: New Consensus Statement on Inpatient Glycemic Control  Target Ranges:  Prepandial:   less than 140 mg/dL      Peak postprandial:   less than 180 mg/dL (1-2 hours)      Critically ill patients:  140 - 180 mg/dL    Latest Reference Range & Units 10/22/24 03:45 10/22/24 08:03 10/22/24 11:54 10/22/24 15:16 10/22/24 20:11 10/22/24 23:59  Glucose-Capillary 70 - 99 mg/dL 810 (H) 796 (H) 804 (H) 192 (H) 161 (H) 148 (H)   Review of Glycemic Control  Diabetes history:No Outpatient Diabetes medications: NA Current orders for Inpatient glycemic control: Semglee 8 units daily, Novolog  0-15 units Q4H; Decadron  4 mg Q6H, Vital @ 65 ml/hr  Inpatient Diabetes Program Recommendations:    Insulin : If steroids and tube feedings are continued, please consider increasing Semglee to 12 units daily and ordering Novolog  3 units Q4H for tube feeding coverage. If tube feeding is stopped or held then Novolog  tube feeding coverage should also be stopped or held.  Thanks, Earnie Gainer, RN, MSN, CDCES Diabetes Coordinator Inpatient Diabetes Program [phone] (Team Pager from 8am to 5pm)

## 2024-10-23 NOTE — Progress Notes (Signed)
 LTM maint complete - Mild  skin irritation under:  f7,f8,f3,f4

## 2024-10-23 NOTE — Progress Notes (Signed)
 PT Cancellation Note  Patient Details Name: [name] MRN: [id number] DOB: 1961/03/11   Cancelled Treatment:    Reason Eval/Treat Not Completed: Fatigue/lethargy limiting ability to participate; new order placed post extubation and noted pt too lethargic for SLP.  Will attempt another day.   Montie Portal 10/23/2024, 4:20 PM Micheline Portal, PT Acute Rehabilitation Services Office:[phone] 10/23/2024

## 2024-10-23 NOTE — Procedures (Signed)
 Patient Name: [name]  MRN: [id number]  Epilepsy Attending: Arlin MALVA Krebs  Referring Physician/Provider: everitt Clint Abbey Earle FORBES, NP  Duration: 10/22/2024 1100 to 10/23/2024 1100   Patient history: 63yo M with ams. EEG to evaluate for seizure   Level of alertness: awake/lethargic   AEDs during EEG study: LEV   Technical aspects: This EEG study was done with scalp electrodes positioned according to the 10-20 International system of electrode placement. Electrical activity was reviewed with band pass filter of 1-70Hz , sensitivity of 7 uV/mm, display speed of 58mm/sec with a 60Hz  notched filter applied as appropriate. EEG data were recorded continuously and digitally stored.  Video monitoring was available and reviewed as appropriate.   Description: EEG showed continuous generalized and lateralized left hemisphere 3 to 6 Hz theta-delta slowing. Hyperventilation and photic stimulation were not performed.      ABNORMALITY - Continuous slow, generalized and lateralized left hemisphere   IMPRESSION: This study is showed cortical dysfunction arising from left hemisphere likely secondary to underlying structural abnormality. Additionally there is generalized cerebral dysfunction (encephalopathy). No seizures were seen throughout the recording.   Chyrel Taha O Rutilio Yellowhair

## 2024-10-23 NOTE — Plan of Care (Signed)
  Problem: Education: Goal: Knowledge of General Education information will improve Description: Including pain rating scale, medication(s)/side effects and non-pharmacologic comfort measures Outcome: Not Progressing   Problem: Health Behavior/Discharge Planning: Goal: Ability to manage health-related needs will improve Outcome: Not Progressing   Problem: Clinical Measurements: Goal: Ability to maintain clinical measurements within normal limits will improve Outcome: Not Progressing Goal: Will remain free from infection Outcome: Not Progressing Goal: Diagnostic test results will improve Outcome: Not Progressing Goal: Respiratory complications will improve Outcome: Progressing Goal: Cardiovascular complication will be avoided Outcome: Not Progressing   Problem: Activity: Goal: Risk for activity intolerance will decrease Outcome: Not Progressing   Problem: Nutrition: Goal: Adequate nutrition will be maintained Outcome: Not Progressing   Problem: Coping: Goal: Level of anxiety will decrease Outcome: Not Progressing   Problem: Elimination: Goal: Will not experience complications related to bowel motility Outcome: Not Progressing Goal: Will not experience complications related to urinary retention Outcome: Not Progressing   Problem: Pain Managment: Goal: General experience of comfort will improve and/or be controlled Outcome: Progressing   Problem: Safety: Goal: Ability to remain free from injury will improve Outcome: Not Progressing   Problem: Skin Integrity: Goal: Risk for impaired skin integrity will decrease Outcome: Not Progressing   Problem: Activity: Goal: Ability to tolerate increased activity will improve Outcome: Completed/Met   Problem: Respiratory: Goal: Ability to maintain a clear airway and adequate ventilation will improve Outcome: Progressing   Problem: Medication: Goal: Risk for medication side effects will decrease Outcome: Not Progressing    Problem: Clinical Measurements: Goal: Complications related to the disease process, condition or treatment will be avoided or minimized Outcome: Not Progressing Goal: Diagnostic test results will improve Outcome: Not Progressing

## 2024-10-23 NOTE — Progress Notes (Signed)
 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 Primary team had started HAP coverage after 2 days of admission. Trach cx sent 11/17 in process. Mrsa nares pcr negative. Afebrile/no leukocytosis; minimal vent requirement   10/23/24 id assessment Cxr confirmed no opacity per official read. Patient extubated today. Appropriate and interact with me no focal deficit. 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)   Seizure (HCC)   Allergies  Allergen Reactions   Bee Venom Rash   Morphine And Codeine Swelling    Scheduled Meds:  Chlorhexidine  Gluconate Cloth  6 each Topical Daily   dexamethasone  (DECADRON ) injection  4 mg Intravenous Q6H   enoxaparin  (LOVENOX ) injection  40 mg Subcutaneous Q24H   insulin  aspart  0-15  Units Subcutaneous Q4H   insulin  aspart  3 Units Subcutaneous Q4H   [START ON 10/24/2024] insulin  glargine-yfgn  12 Units Subcutaneous Daily   levETIRAcetam  1,000 mg Intravenous Q12H   mouth rinse  15 mL Mouth Rinse Q2H   pantoprazole  (PROTONIX ) IV  40 mg Intravenous Q24H   polyethylene glycol  17 g Per Tube BID   senna-docusate  2 tablet Per Tube BID   sodium chloride  flush  10-40 mL Intracatheter Q12H   Continuous Infusions:  acyclovir Stopped (10/23/24 0642)   feeding supplement (OSMOLITE 1.5 CAL) 50 mL/hr at 10/23/24 1400   norepinephrine (LEVOPHED) Adult infusion Stopped (10/23/24 0708)   piperacillin-tazobactam (ZOSYN)  IV 3.375 g (10/23/24 1031)   PRN Meds:.acetaminophen  **OR** acetaminophen , midazolam  PF, ondansetron  **OR** ondansetron  (ZOFRAN ) IV, mouth rinse, polyethylene glycol, sodium chloride  flush   SUBJECTIVE: 1 fever episode yesterday Extubated this morning Slow mentation No other event   Review of Systems: ROS All other ROS was negative, except mentioned above     OBJECTIVE: Vitals:   10/23/24 1312 10/23/24 1330 10/23/24 1345 10/23/24 1400  BP: 97/63 109/62  93/63  Pulse: (!) 58 62 (!) 58 66  Resp: (!) 21 15 (!) 25 17  Temp:      TempSrc:      SpO2: 99% 96% 99% 98%  Weight:      Height:       Body mass index is 29.79 kg/m.  Physical Exam General/constitutional: sleepy, arousable, interact appropriately HEENT: Normocephalic, PER, Conj Clear, EOMI CV: rrr no mrg Lungs: clear to auscultation, normal respiratory effort Abd: Soft, Nontender Ext: no  edema Skin: No Rash Neuro: generalized weakness no focal motor deficit; wave arm at me when I did to him MSK: no peripheral joint swelling/tenderness/warmth; back spines nontender   Central line presence: left upper ext picc site no bleeding/erythema/purulence   Lab Results Lab Results  Component Value Date   WBC 6.9 10/22/2024   HGB 12.9 (L) 10/22/2024   HCT 35.5 (L) 10/22/2024   MCV 90.8  10/22/2024   PLT 192 10/22/2024    Lab Results  Component Value Date   CREATININE 0.68 10/23/2024   BUN 17 10/23/2024   NA 136 10/23/2024   K 4.1 10/23/2024   CL 101 10/23/2024   CO2 23 10/23/2024    Lab Results  Component Value Date   ALT 37 10/17/2024   AST 38 10/17/2024   ALKPHOS 43 10/17/2024   BILITOT 1.2 10/17/2024      Microbiology: Recent Results (from the past 240 hours)  Resp panel by RT-PCR (RSV, Flu A&B, Covid) Anterior Nasal Swab     Status: None   Collection Time: 10/17/24 12:49 PM   Specimen: Anterior Nasal Swab  Result Value Ref Range Status   SARS Coronavirus 2 by RT PCR NEGATIVE NEGATIVE Final    Comment: (NOTE) SARS-CoV-2 target nucleic acids are NOT DETECTED.  The SARS-CoV-2 RNA is generally detectable in upper respiratory specimens during the acute phase of infection. The lowest concentration of SARS-CoV-2 viral copies this assay can detect is 138 copies/mL. A negative result does not preclude SARS-Cov-2 infection and should not be used as the sole basis for treatment or other patient management decisions. A negative result may occur with  improper specimen collection/handling, submission of specimen other than nasopharyngeal swab, presence of viral mutation(s) within the areas targeted by this assay, and inadequate number of viral copies(<138 copies/mL). A negative result must be combined with clinical observations, patient history, and epidemiological information. The expected result is Negative.  Fact Sheet for Patients:  bloggercourse.com  Fact Sheet for Healthcare Providers:  seriousbroker.it  This test is no t yet approved or cleared by the [place]  FDA and  has been authorized for detection and/or diagnosis of SARS-CoV-2 by FDA under an Emergency Use Authorization (EUA). This EUA will remain  in effect (meaning this test can be used) for the duration of the COVID-19 declaration  under Section 564(b)(1) of the Act, 21 U.S.C.section 360bbb-3(b)(1), unless the authorization is terminated  or revoked sooner.       Influenza A by PCR NEGATIVE NEGATIVE Final   Influenza B by PCR NEGATIVE NEGATIVE Final    Comment: (NOTE) The Xpert Xpress SARS-CoV-2/FLU/RSV plus assay is intended as an aid in the diagnosis of influenza from Nasopharyngeal swab specimens and should not be used as a sole basis for treatment. Nasal washings and aspirates are unacceptable for Xpert Xpress SARS-CoV-2/FLU/RSV testing.  Fact Sheet for Patients: bloggercourse.com  Fact Sheet for Healthcare Providers: seriousbroker.it  This test is not yet approved or cleared by the [place]  FDA and has been authorized for detection and/or diagnosis of SARS-CoV-2 by FDA under an Emergency Use Authorization (EUA). This EUA will remain in effect (meaning this test can be used) for the duration of the COVID-19 declaration under Section 564(b)(1) of the Act, 21 U.S.C. section 360bbb-3(b)(1), unless the authorization is terminated or revoked.     Resp Syncytial Virus by PCR NEGATIVE NEGATIVE Final    Comment: (NOTE) Fact Sheet for Patients: bloggercourse.com  Fact Sheet for Healthcare Providers: seriousbroker.it  This test is not  yet approved or cleared by the [place]  FDA and has been authorized for detection and/or diagnosis of SARS-CoV-2 by FDA under an Emergency Use Authorization (EUA). This EUA will remain in effect (meaning this test can be used) for the duration of the COVID-19 declaration under Section 564(b)(1) of the Act, 21 U.S.C. section 360bbb-3(b)(1), unless the authorization is terminated or revoked.  Performed at [facility], [address] [place], [place] [address]   Blood Culture (routine x 2)     Status: None   Collection Time: 10/17/24  1:20 PM   Specimen: BLOOD   Result Value Ref Range Status   Specimen Description BLOOD BLOOD RIGHT HAND  Final   Special Requests   Final    BOTTLES DRAWN AEROBIC AND ANAEROBIC Blood Culture results may not be optimal due to an inadequate volume of blood received in culture bottles   Culture   Final    NO GROWTH 5 DAYS Performed at [facility], [address] [place], [place] [address]    Report Status 10/22/2024 FINAL  Final  Blood Culture (routine x 2)     Status: None   Collection Time: 10/17/24  1:29 PM   Specimen: BLOOD  Result Value Ref Range Status   Specimen Description BLOOD BLOOD RIGHT ARM  Final   Special Requests   Final    BOTTLES DRAWN AEROBIC AND ANAEROBIC Blood Culture adequate volume   Culture   Final    NO GROWTH 5 DAYS Performed at [facility], [address] [place], [place] [address]    Report Status 10/22/2024 FINAL  Final  Urine Culture     Status: None   Collection Time: 10/17/24  5:13 PM   Specimen: Urine, Catheterized  Result Value Ref Range Status   Specimen Description   Final    URINE, CATHETERIZED Performed at [facility], [address], [place], [place] [address]    Special Requests   Final    NONE Performed at [facility], [address] [place], [place] [address]    Culture   Final    NO GROWTH Performed at [facility], [address] [place], [place] [address]    Report Status 10/19/2024 FINAL  Final  CSF culture w Gram Stain     Status: None   Collection Time: 10/18/24  1:36 PM   Specimen: PATH Cytology CSF; Cerebrospinal Fluid  Result Value Ref Range Status   Specimen Description CSF  Final   Special Requests NONE  Final   Gram Stain NO WBC SEEN NO ORGANISMS SEEN   Final   Culture   Final    NO GROWTH 3 DAYS Performed at [facility], [address], [place], [place] [address]    Report Status 10/21/2024 FINAL  Final  MRSA Next Gen by PCR, Nasal     Status: None   Collection Time: 10/19/24  1:13 PM   Specimen: Nasal Mucosa; Nasal Swab   Result Value Ref Range Status   MRSA by PCR Next Gen NOT DETECTED NOT DETECTED Final    Comment: (NOTE) The GeneXpert MRSA Assay (FDA approved for NASAL specimens only), is one component of a comprehensive MRSA colonization surveillance program. It is not intended to diagnose MRSA infection nor to guide or monitor treatment for MRSA infections. Test performance is not FDA approved in patients less than 45 years old. Performed at [facility], [address], [place], [place] [address]   Culture, Respiratory w Gram  Stain     Status: None (Preliminary result)   Collection Time: 10/22/24 10:03 AM   Specimen: Tracheal Aspirate; Respiratory  Result Value Ref Range Status   Specimen Description TRACHEAL ASPIRATE  Final   Special Requests NONE  Final   Gram Stain   Final    RARE WBC PRESENT, PREDOMINANTLY PMN FEW GRAM POSITIVE COCCI RARE GRAM POSITIVE RODS RARE GRAM NEGATIVE RODS    Culture   Final    CULTURE REINCUBATED FOR BETTER GROWTH Performed at [facility], [address] [place], [place] [address]    Report Status PENDING  Incomplete  MRSA Next Gen by PCR, Nasal     Status: None   Collection Time: 10/22/24 10:04 AM   Specimen: Nasal Mucosa; Nasal Swab  Result Value Ref Range Status   MRSA by PCR Next Gen NOT DETECTED NOT DETECTED Final    Comment: (NOTE) The GeneXpert MRSA Assay (FDA approved for NASAL specimens only), is one component of a comprehensive MRSA colonization surveillance program. It is not intended to diagnose MRSA infection nor to guide or monitor treatment for MRSA infections. Test performance is not FDA approved in patients less than 67 years old. Performed at [facility], [address], [place], [place] [address]      Serology:   Imaging: If present, new imagings (plain films, ct scans, and mri) have been personally visualized and interpreted; radiology reports have been reviewed. Decision making incorporated into the  Impression / Recommendations.  11/17 cxr No official report yet. No obvious pulm opacity from my read   11/13 mri brain 1. No acute intracranial abnormality. 2. Increased T2 signal within the left mesial temporal lobe, without abnormal enhancement. Clinical correlation and follow up to ensure resolution is recommended.   Constance ONEIDA Passer, MD [facility] [phone] pager    10/23/2024, 2:07 PM

## 2024-10-23 NOTE — Progress Notes (Addendum)
 0900 unable to get temp on patient so did rectal 95.1 rectal temp ordered bear hugger blanket to place on patient warm blanket also applies 1100 Patient extubated 1300 patient to lethargic unable to complete swallow

## 2024-10-23 NOTE — Procedures (Signed)
 Extubation Procedure Note  Patient Details:   Name: DIONTAY ROSENCRANS DOB: Aug 02, 1961 MRN: [id number]   Airway Documentation:    Vent end date: 10/23/24 Vent end time: 1058   Evaluation  O2 sats: stable throughout Complications: No apparent complications Patient did tolerate procedure well. Bilateral Breath Sounds: Clear   Yes  Pt extubated per MD order, placed on 3L [place]. Positive cuff leak heard, no stridor post extubation. VSS. RT will continue to monitor.   Duwaine Charles 10/23/2024, 11:00 AM

## 2024-10-23 NOTE — Progress Notes (Addendum)
 NEUROLOGY CONSULT FOLLOW UP NOTE   Date of service: October 23, 2024 Patient Name: [name] MRN:  [id number] DOB:  02/11/1961  Interval Hx/subjective   Wife is at the bedside.  Patient is intubated and is currently weaning. He remains on LTM His eyes are closed however he awakens to voice and is following commands and moving all 4 extremities Vitals   Vitals:   10/23/24 0800 10/23/24 0908 10/23/24 0930 10/23/24 1000  BP: 104/68  101/68 97/62  Pulse: (!) 52  64 (!) 59  Resp: 18  19 (!) 23  Temp:  (!) 95.1 F (35.1 C)    TempSrc:  Rectal    SpO2: 100%  100% 100%  Weight:      Height:         Body mass index is 29.79 kg/m.  Physical Exam   Constitutional: Critically ill Psych: Affect appropriate to situation.  Eyes: No scleral injection.  HENT: No OP obstrucion.  ET tube and feeding tube in place Head: Normocephalic.  Cardiovascular: Normal rate and regular rhythm.  Respiratory: On ventilator GI: Soft.  No distension. There is no tenderness.  Skin: WDI.   Neurologic Examination   Mental Status -  Intubated on no sedation.  Eyes are closed but opens to voice.  He is following commands briskly  Cranial Nerves II - XII - II - Visual field blinks to threat bilaterally III, IV, VI - Extraocular movements intact.  Tracking V - Facial sensation intact bilaterally. VII -unable to assess VIII - Hearing & vestibular intact bilaterally. X - Palate elevates symmetrically. XI - Chin turning & shoulder shrug intact bilaterally. XII - Tongue protrusion intact.  Motor Strength -he is able to lift all 4 extremities antigravity and spontaneous bulk was normal and fasciculations were absent.   Motor Tone - Muscle tone was assessed at the neck and appendages and was normal. Sensory - Light touch, temperature/pinprick were assessed and were symmetrical.   Coordination - The patient had normal movements in the hands and feet with no ataxia or dysmetria.  Tremor was  absent. Gait and Station - deferred.  Medications  Current Facility-Administered Medications:    0.9 %  sodium chloride  infusion, , Intravenous, Continuous, Nathan Leita SHAUNNA, DO, Last Rate: 50 mL/hr at 10/23/24 1000, Infusion Verify at 10/23/24 1000   acetaminophen  (TYLENOL ) tablet 650 mg, 650 mg, Per Tube, Q6H PRN, 650 mg at 10/22/24 2321 **OR** acetaminophen  (TYLENOL ) suppository 650 mg, 650 mg, Rectal, Q6H PRN, Nathan Acquanetta Bradley, MD   acyclovir (ZOVIRAX) 800 mg in dextrose  5 % 250 mL IVPB, 800 mg, Intravenous, Q8H, [name], Nathan E, MD, Stopped at 10/23/24 9357   Chlorhexidine  Gluconate Cloth 2 % PADS 6 each, 6 each, Topical, Daily, [name], Nathan E, MD, 6 each at 10/22/24 0917   dexamethasone  (DECADRON ) injection 4 mg, 4 mg, Intravenous, Q6H, [name], Salman, MD, 4 mg at 10/23/24 9487   enoxaparin  (LOVENOX ) injection 40 mg, 40 mg, Subcutaneous, Q24H, Nathan Acquanetta Bradley, MD, 40 mg at 10/22/24 1557   feeding supplement (OSMOLITE 1.5 CAL) liquid 1,000 mL, 1,000 mL, Per Tube, Continuous, Nathan Leita P, DO   insulin  aspart (novoLOG ) injection 0-15 Units, 0-15 Units, Subcutaneous, Q4H, Nathan Leita SHAUNNA, DO, 3 Units at 10/23/24 0815   insulin  glargine-yfgn [facility]) injection 8 Units, 8 Units, Subcutaneous, Daily, Nathan Leita SHAUNNA, DO, 8 Units at 10/23/24 0924   levETIRAcetam (KEPPRA) undiluted injection 1,000 mg, 1,000 mg, Intravenous, Q12H, Michaela Aisha SHAUNNA, MD, 1,000 mg at 10/23/24 [phone]  midazolam  PF (VERSED ) injection 1-2 mg, 1-2 mg, Intravenous, Q1H PRN, Nathan Fonda BROCKS, NP   norepinephrine (LEVOPHED) 4mg  in (0.016 mg/mL) premix infusion, 0-40 mcg/min, Intravenous, Titrated, Nathan Fonda BROCKS, NP, Stopped at 10/23/24 9291   ondansetron  (ZOFRAN ) tablet 4 mg, 4 mg, Oral, Q6H PRN **OR** ondansetron  (ZOFRAN ) injection 4 mg, 4 mg, Intravenous, Q6H PRN, [name], Nathan E, MD   Oral care mouth rinse, 15 mL, Mouth Rinse, Q2H, [name], [name], MD, 15 mL at 10/23/24 9075   Oral  care mouth rinse, 15 mL, Mouth Rinse, PRN, [name], [name], MD   pantoprazole  (PROTONIX ) injection 40 mg, 40 mg, Intravenous, Q24H, [name], [name], MD, 40 mg at 10/22/24 1716   piperacillin-tazobactam (ZOSYN) IVPB 3.375 g, 3.375 g, Intravenous, Q8H, [name], [name], RPH, Last Rate: 12.5 mL/hr at 10/23/24 1031, 3.375 g at 10/23/24 1031   polyethylene glycol (MIRALAX  / GLYCOLAX ) packet 17 g, 17 g, Oral, Daily PRN, [name], Nathan E, MD   polyethylene glycol (MIRALAX  / GLYCOLAX ) packet 17 g, 17 g, Per Tube, BID, Nathan Doffing P, DO, 17 g at 10/23/24 9075   senna-docusate (Senokot-[name]) tablet 2 tablet, 2 tablet, Per Tube, BID, Nathan Doffing SQUIBB, DO, 2 tablet at 10/23/24 0924   sodium chloride  flush (NS) 0.9 % injection 10-40 mL, 10-40 mL, Intracatheter, Q12H, [name], [name], MD, 10 mL at 10/23/24 0924   sodium chloride  flush (NS) 0.9 % injection 10-40 mL, 10-40 mL, Intracatheter, PRN, Nathan Pore, MD  Labs and Diagnostic Imaging   CBC:  Recent Labs  Lab 10/17/24 1329 10/18/24 0658 10/21/24 0320 10/22/24 0540  WBC 13.1*   < > 6.1 6.9  NEUTROABS 11.5*  --   --   --   HGB 15.0   < > 14.1 12.9*  HCT 40.4   < > 38.8* 35.5*  MCV 90.0   < > 90.2 90.8  PLT 215   < > 167 192   < > = values in this interval not displayed.    Basic Metabolic Panel:  Lab Results  Component Value Date   NA 136 10/23/2024   K 4.1 10/23/2024   CO2 23 10/23/2024   GLUCOSE 209 (H) 10/23/2024   BUN 17 10/23/2024   CREATININE 0.68 10/23/2024   CALCIUM  8.8 (L) 10/23/2024   GFRNONAA >60 10/23/2024   GFRAA 92 01/16/2021   Lipid Panel:  Lab Results  Component Value Date   LDLCALC 155 (H) 06/13/2024   HgbA1c:  Lab Results  Component Value Date   HGBA1C 5.6 10/23/2024   Urine Drug Screen: No results found for: LABOPIA, COCAINSCRNUR, LABBENZ, AMPHETMU, THCU, LABBARB  Alcohol Level No results found for: [facility] INR  Lab Results  Component Value Date   INR 1.1 10/17/2024   APTT   Lab Results  Component Value Date   APTT 26 11/19/2019   AED levels: No results found for: PHENYTOIN, ZONISAMIDE, LAMOTRIGINE, LEVETIRACETA  Labs and Diagnostic Imaging  CSF WBC 60 CSF RBC 3200(suspect mildly traumatic given colorless supernatant) CSF protein 92 CSF glucose 55 CSF meningitis panel (+) for HSV1    MRI brain wwo 11/12 Abnormal diffusion and FLAIR signal abnormality involving the left insular cortex and inferior left frontal lobe, as well as the mesial left temporal lobe 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 Marked interval progression of edema in the mesial left temporal lobe and insula, now also extending to involve the overlying left greater than right frontal lobes, compatible with progressive herpes encephalitis  given the clinical history. Areas of intermixed mild hyperdensity is suspicious for non-mass occupying acute hemorrhage. Repeat MRI with contrast could further characterize if clinically warranted.  LTM 11/16 - 11/17 : ABNORMALITY - Continuous slow, generalized and lateralized left hemisphere IMPRESSION: This study is showed cortical dysfunction arising from left hemisphere likely secondary to underlying structural abnormality. Additionally there is generalized cerebral dysfunction (encephalopathy). No seizures were seen throughout the recording.  LTM 11/17-11/18:  Assessment   KAINEN STRUCKMAN is a 63 y.o. 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  Keppra increased to 1g bid on 11/16 2/2 continued irritability on EEG.   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 Decadron  started. Weak evidence to support decadron  is HSV encephalitis in general but given degree of cerebral edema will  initiate  Recommendations  -Continue Keppra 1g twice daily -Continue LTM EEG -Appreciate ID consults continuing to follow and provide guidance  Begin decadron  taper as follows Days 1-2 (11/18-11/19): 4mg  IV q 8 hrs Days 3-4: 4mg  IV q 12 hrs Days 5-6: 2mg  IV q 12 hrs Days 7-8: 2mg  IV daily Then discontinue  Taper has been ordered. When extubated and able to take po, IV can be changed to po decadron   Neurology will continue to follow  _____________________________________________________________   Signed, Karna DELENA Geralds, NP [facility]   Attending Neurohospitalist Addendum Patient seen and examined with APP/Resident. Agree with the history and physical as documented above. Agree with the plan as documented, which I helped formulate. I have edited the note above to reflect my full findings and recommendations. I have independently reviewed the chart, obtained history, review of systems and examined the patient.I have personally reviewed pertinent head/neck/spine imaging (CT/MRI). Please feel free to call with any questions.  This patient is critically ill and at significant risk of neurological worsening, death and care requires constant monitoring of vital signs, hemodynamics,respiratory and cardiac monitoring, neurological assessment, discussion with family, other specialists and medical decision making of high complexity. I spent 35 minutes of neurocritical care time  in the care of  this patient. This was time spent independent of any time provided by nurse practitioner or PA.  Elida Ross, MD [facility] [phone]  If 7pm- 7am, please page neurology on call as listed in AMION.

## 2024-10-23 NOTE — Progress Notes (Signed)
 NAME:  [name], MRN:  [id number], DOB:  10-28-1961, LOS: 6 ADMISSION DATE:  10/17/2024, CONSULTATION DATE:  10/19/2024 REFERRING MD:  Michaela CHIEF COMPLAINT:  AMS   History of Present Illness:  [name] is a 63 year old male with a pertinent past medical history of arthritis, basal cell carcinoma of the left arm (2019), GERD, and a history of small bowel obstruction who presented to [facility] ED with concerns of generalized weakness and a fall on 11/12. For the past three weeks, the patient reports feeling unwell, having developed URI symptoms including congestion, headaches, cough, as well as some gastrointestinal symptoms like nauseas/vomiting and diarrhea. In the ED, patient was somnolent with worsening mental status. MRI of the head was done and showed abnormal diffusion with FLAIR signal abnormality of the insular left cortex, inferior left frontal lobe, and mesial left temporal lobe, raising concerns for acute encephalitis. Lumbar puncture was obtained, showing CSF with WBC 60, RBCs 2300, protein 92, glucose 55, and a meningitis panel positive for HSV1. He had been given Vancomycin, CTX, and Azithromycin  in the ED, and subsequently started on acyclovir. ON 11/14, he began developing new seizure activity, and neurology recommended PCCM consult for ICU transfer to maintain airway protection and initiate AEDs.   Patient was given Ativan  1mg  and Keppra load. Wife was at bedside. Discussed with wife that patient will need to be transferred to ICU and urgently intubated.  Wife understood and consented to procedure verbally.  Pertinent  Medical History   Past Medical History:  Diagnosis Date   Allergy    Arthritis    Calcaneus fracture, left    Cancer (HCC)    Skin cancer Left Arm   GERD (gastroesophageal reflux disease)    Small bowel obstruction (HCC)    Significant Hospital Events: Including procedures, antibiotic start and stop dates in addition to other pertinent events    11/12 admitted for AMS, generalized weakness, fall 11/13 LP completed indicating HSV1 encephalitis 11/14 worsening altered mental status with seizure activity, started on AEDs, then transferred to ICU for intubation as pt could not protect airway 11/18 extubated and placed on 3L [place], saturating well  Interim History / Subjective:  Spoke with patient's wife who provided history-- overnight patient with increased blood-tinged sputum in ETT. Temperature maxed to 100.4 around 2300, was given Tylenol . Temp now stable. HR dropped to mid-40s, but patient was not distressed as he was in/out of sleep. Was restarted on Fentanyl  last night. He has been weaned off NE and Fentanyl  this AM.  Patient becoming more conscious.  Objective   Blood pressure 98/62, pulse 65, temperature (!) 97.4 F (36.3 C), temperature source Axillary, resp. rate (!) 22, height 5' 5 (1.651 m), weight 81.2 kg, SpO2 98%.    Vent Mode: PSV;CPAP FiO2 (%):  [40 %] 40 % Set Rate:  [20 bmp] 20 bmp Vt Set:  [490 mL] 490 mL PEEP:  [5 cmH20] 5 cmH20 Pressure Support:  [5 cmH20-10 cmH20] 5 cmH20 Plateau Pressure:  [13 cmH20-16 cmH20] 13 cmH20   Intake/Output Summary (Last 24 hours) at 10/23/2024 1201 Last data filed at 10/23/2024 1200 Gross per 24 hour  Intake 4223.58 ml  Output 5055 ml  Net -831.42 ml   Filed Weights   10/21/24 0203 10/22/24 0341 10/23/24 0419  Weight: 81.9 kg 80.6 kg 81.2 kg   Examination: General: chronically ill-appearing male, NAD HENT: Normal conjunctivae, pupils reactive bilaterally, eyes anicteric, feeding tube placed in left nare Respiratory: Breathing comfortably on MV, slight blood-tinged  secretions from ETT Cardiovascular: RRR, no murmurs, rubs, gallops Abdomen: positive bowel sounds, soft, no tenderness to palpation Extremities: radial pulses 2+ bilaterally, no edema, PICC line placed in LUE without erythema Neuro: Opened eyes to voice, follows commands (wiggled toes, lifted bilateral legs  for nurse), able to nod yes to answer questions.  Pertinent imaging, labs, and tests:  Na 136 K 4.1 BUN/Cr 17/0.68 Phosphorus 2.8, improved from 2.4 BG 209, A1c 5.6%  CXR 11/17 showing no opacities, pleural effusion, or PTX. ETT/PICC/enteral feeding tube in place.  Resolved problem list   Assessment and Plan  Neuro: HSV-1 Encephalitis with seizure Acute encephalopathy 2/2 above P:  MRI with insular left cortex and inferior left frontal lobe and mesial left temporal lobe abnormality; edema worse on overnight CT. - Continue acyclovir for 3 weeks per ID recommendations - Steroids started by Neuro for swelling, will continue Decadron  as risks seems less than potential benefit with worse edema. Will need 7-10d taper. -cEEG showed continuous generalized and lateralized left hemisphere 3-6Hz  theta-delta slowing, indicating cortical dysfunction arising from left hemisphere likely 2/2 underlying structural abnormality. Generalized cerebral dysfxn with encephalopathy, but no seizures noted. -Continue keppra 1g BID; ativan  PRN -Seizure precautions -Aggressively treat fevers -Address hyponatremia; want to avoid free water . Switch LR with acyclovir to NS.  -Pending recovery and goals of care discussions, will need to arrange trach and PEG this week or next week for expected prolonged recovery  Depression  P:  -Continue home sertraline   Fevers- worry about aspiration pneumonia, Tmax overnight 100.4, continue tylenol  PRN  Septic shock 2/2 encephalitis Sedation related hypotension P:  -CXR final read showing no pulmonary opacity, pleural effusion, or PTX. ETT, and enteric feeding tube in place. Pt remains afebrile and without leukocytosis. - Will wean Levophed with maintenance goals of MAP> 65 - MRSA nares, Bcx, Ucx, all negative - Pending final trach aspirate culture, preliminary gram stain shows gram positive cocci/rods, and gram -ve rods. - LP suggests viral meningoencephalitis, CSF cx  negative and HIV negative. - Will continue Zosyn coverage for HAP until 11/19 since CXR negative but pending final trach aspirate. Linezolid stopped on 11/17 by ID.  - Continue Acyclovir (started 11/12) ID recommends continuing for 3 weeks (end date 12/3)  Respiratory: Acute hypoxemic respiratory failure requiring MV P:  -Patient to be extubated today -LTVV -VAP prevention protocol -PAD protocol for sedation -daily SAT & SBT as appropriate   Gastrointestinal: GERD P: -Continue Protonix  40mg  BID   Constipation P:  - Continue senna, miralax    Endocrine: Hypergylcemia P: -Anticipate hyperglycemia with continued use of steroids -Increasing Semglee to 12 units daily given concurrent steroid use -Novolog  3u q4h for tube feeding coverage. If tube feeds stopped/held, then also stop/hold Novolog .  -SSI PRN -A1c 5.6%   Hypophosphatemia P:  - Improved today to 2.8 after supplement yesterday - Will continue to trend daily   Critical care time: 48    Libertie Hausler, DO Internal Medicine Resident, PGY-1 Please contact the on call pager at [phone] for any urgent or emergent needs. 12:01 PM 10/23/2024

## 2024-10-24 ENCOUNTER — Inpatient Hospital Stay (HOSPITAL_COMMUNITY)

## 2024-10-24 DIAGNOSIS — G936 Cerebral edema: Secondary | ICD-10-CM | POA: Diagnosis not present

## 2024-10-24 DIAGNOSIS — R339 Retention of urine, unspecified: Secondary | ICD-10-CM

## 2024-10-24 DIAGNOSIS — R569 Unspecified convulsions: Secondary | ICD-10-CM | POA: Diagnosis not present

## 2024-10-24 DIAGNOSIS — B020 Zoster encephalitis: Secondary | ICD-10-CM | POA: Diagnosis not present

## 2024-10-24 DIAGNOSIS — R4182 Altered mental status, unspecified: Secondary | ICD-10-CM | POA: Diagnosis not present

## 2024-10-24 LAB — CULTURE, RESPIRATORY W GRAM STAIN: Culture: NORMAL

## 2024-10-24 LAB — GLUCOSE, CAPILLARY
Glucose-Capillary: 106 mg/dL — ABNORMAL HIGH (ref 70–99)
Glucose-Capillary: 111 mg/dL — ABNORMAL HIGH (ref 70–99)
Glucose-Capillary: 113 mg/dL — ABNORMAL HIGH (ref 70–99)
Glucose-Capillary: 143 mg/dL — ABNORMAL HIGH (ref 70–99)
Glucose-Capillary: 94 mg/dL (ref 70–99)
Glucose-Capillary: 99 mg/dL (ref 70–99)

## 2024-10-24 LAB — PHOSPHORUS: Phosphorus: 3.1 mg/dL (ref 2.5–4.6)

## 2024-10-24 MED ORDER — SENNOSIDES-DOCUSATE SODIUM 8.6-50 MG PO TABS: 1.0000 | ORAL_TABLET | Freq: Every day | ORAL | Status: DC

## 2024-10-24 MED ORDER — PANTOPRAZOLE SODIUM 40 MG PO TBEC
40.0000 mg | DELAYED_RELEASE_TABLET | Freq: Every day | ORAL | Status: DC
  Administered 2024-10-24 – 2024-10-29 (×6): 40 mg via ORAL
  Filled 2024-10-24 (×6): qty 1

## 2024-10-24 MED ORDER — LEVETIRACETAM 500 MG PO TABS
1000.0000 mg | ORAL_TABLET | Freq: Two times a day (BID) | ORAL | Status: DC
  Administered 2024-10-24 – 2024-10-30 (×12): 1000 mg via ORAL
  Filled 2024-10-24 (×12): qty 2

## 2024-10-24 NOTE — Hospital Course (Signed)
 [name] is a 63 year old male with a pertinent past medical history of arthritis, basal cell carcinoma of the left arm (2019), GERD, and a history of small bowel obstruction who presented to [facility] ED with concerns of generalized weakness and a fall on 11/12. For the past three weeks, the patient reports feeling unwell, having developed URI symptoms including congestion, headaches, cough, as well as some gastrointestinal symptoms like nauseas/vomiting and diarrhea. In the ED, patient was somnolent with worsening mental status. MRI of the head was done and showed abnormal diffusion with FLAIR signal abnormality of the insular left cortex, inferior left frontal lobe, and mesial left temporal lobe, raising concerns for acute encephalitis. Lumbar puncture was obtained, showing CSF with WBC 60, RBCs 2300, protein 92, glucose 55, and a meningitis panel positive for HSV1. He had been given Vancomycin, CTX, and Azithromycin  in the ED, and subsequently started on acyclovir. ON 11/14, he began developing new seizure activity, and neurology recommended PCCM consult for ICU transfer to maintain airway protection and initiate AEDs.    Patient was given Ativan  1mg  and Keppra load. Wife was at bedside. Discussed with wife that patient will need to be transferred to ICU and urgently intubated.  Wife understood and consented to procedure verbally. --------------------------------------------------------------------------------------------- Physical Exam: Blood pressure 111/64, pulse 68, temperature (!) 96.9 F (36.1 C), temperature source Axillary, resp. rate 20, height 5' 5 (1.651 m), weight 80.4 kg, SpO2 99%.     >  Vent Mode: PSV;CPAP FiO2 (%):  [40 %] 40 % Set Rate:  [20 bmp] 20 bmp Vt Set:  [490 mL] 490 mL PEEP:  [5 cmH20] 5 cmH20 Pressure Support:  [5 cmH20] 5 cmH20 Plateau Pressure:  [13 cmH20] 13 cmH20      Intake/Output Summary (Last 24 hours) at 10/24/2024 0736 Last data filed at 10/24/2024 0700     Gross per 24 hour  Intake 3071 ml  Output 2925 ml  Net 146 ml         Filed Weights    10/22/24 0341 10/23/24 0419 10/24/24 0500  Weight: 80.6 kg 81.2 kg 80.4 kg    Examination: General: chronically ill-appearing male, NAD HENT: Normal conjunctivae, pupils reactive bilaterally, eyes anicteric, feeding tube placed in left nare Respiratory: Breathing comfortably on RA, chest clear to auscultation  Cardiovascular: RRR, no murmurs, rubs, gallops Abdomen: positive bowel sounds, soft, no tenderness to palpation Extremities: radial pulses 2+ bilaterally, no edema, PICC line placed in LUE without erythema Neuro: Slightly drowsy and confused. A&O to name, location, and month. Unable to state the year, president, or reason for hospitalization. Follows commands appropriately and identify common objects (watch, ring). Communicates answers verbally.    Pertinent imaging, labs, and tests:  Na 136 Phosphorus 3.1 BG 111   --------------------------------------------------------------------------------------------------------------------------------------------- Neuro: HSV-1 Encephalitis with seizure Acute encephalopathy 2/2 HSV encephalitis P:  MRI with insular left cortex and inferior left frontal lobe and mesial left temporal lobe abnormality; edema worse on CT. - Continue acyclovir for 3 weeks per ID recommendations - Steroids started by Neuro for swelling, will continue Decadron  as risks seems less than potential benefit with worse edema. Neurology has ordered taper starting on 11/18 as follows:  Days 1-2 (11/18-11/19): 4mg  IV q 8 hrs Days 3-4: 4mg  IV q 12 hrs Days 5-6: 2mg  IV q 12 hrs Days 7-8: 2mg  IV daily Then discontinue - Will switch IV to PO Decadron  when pt can tolerate - Discontinue cEEG, still without new seizures - Continue keppra 1g BID; ativan  PRN -  Seizure precautions - Aggressively treat fevers - Address hyponatremia; want to avoid free water . Switch LR with acyclovir to  NS. Will trend BMP  Depression  P:  -Continue home sertraline   Septic shock 2/2 encephalitis Sedation related hypotension P:  - CXR final read showing no pulmonary opacity, pleural effusion, or PTX. ETT, and enteric feeding tube in place. Pt remains afebrile and without leukocytosis. - Concerns for aspiration pneumonia, mild hemoptysis likely 2/2 suction trauma.  - MRSA nares, Bcx, Ucx, all negative - Pending final trach aspirate culture, preliminary gram stain shows gram positive cocci/rods, and gram -ve rods, likely normal colonization  - LP suggests viral meningoencephalitis, CSF cx negative and HIV negative. - Finish Zosyn coverage for HAP today 11/19 since CXR negative but pending final trach aspirate. Linezolid stopped on 11/17 by ID.  - Continue Acyclovir (started 11/12) ID recommends continuing for 3 weeks (end date 12/3)  Respiratory: Acute hypoxemic respiratory failure requiring MV P:  - SAT and SBT passed, patient on room air saturating well  - Concerns for aspiration pneumonia, mild hemoptysis likely 2/2 suction trauma, improving  - SLP evaluated, pt able to sip and masticate solids, able to swallow whole meds with puree - Stop feeding tubes today, trial to regular foods with small sips of thin liquids with upright posture - Last  Zosyn dose today, 11/19 for HAP coverage since pending final trach aspirate and CXR negative.  - LTVV - VAP prevention protocol - PT,OT, SLP as tolerated. PT/OT recommending CIR when patient is able to better tolerate. CIR admissions team will continue to follow   Gastrointestinal: GERD P: -Continue Protonix  40mg  BID   Constipation P:  - Continue Senna once daily  Endocrine: Hypergylcemia P: -Anticipate hyperglycemia with continued use of steroids -Increasing Semglee to 12 units daily given concurrent steroid use -Novolog  3u q4h for tube feeding coverage. If tube feeds stopped/held, then also stop/hold Novolog .  -SSI PRN -A1c 5.6%    Hypophosphatemia, resolved P:  - Improved today to 3.1 after supplementation -------------------------------------------------------------------------------------------------------------------------------------------------

## 2024-10-24 NOTE — Progress Notes (Signed)
   Inpatient Rehab Admissions Coordinator :  Per therapy recommendations patient was screened for CIR candidacy by Ottie Glazier RN MSN. Patient is not yet at a level to tolerate the intensity required to pursue a CIR admit . The CIR admissions team will follow and monitor for progress and place a Rehab Consult order if felt to be appropriate. Please contact me with any questions.  Ottie Glazier RN MSN Admissions Coordinator [phone]

## 2024-10-24 NOTE — Evaluation (Signed)
 Clinical/Bedside Swallow Evaluation Patient Details  Name: [name] MRN: [id number] Date of Birth: [birth year]  Today's Date: 10/24/2024 Time: SLP Start Time (ACUTE ONLY): 1108 SLP Stop Time (ACUTE ONLY): 1128 SLP Time Calculation (min) (ACUTE ONLY): 20 min  Past Medical History:  Past Medical History:  Diagnosis Date   Arthritis    Calcaneus fracture, left    Cancer (HCC)    Skin cancer Left Arm   GERD (gastroesophageal reflux disease)    Small bowel obstruction (HCC)    Past Surgical History:  Past Surgical History:  Procedure Laterality Date   FOOT SURGERY     HAND SURGERY Right    contracture of hand   INGUINAL HERNIA REPAIR Right 11/23/2013   Procedure: HERNIA REPAIR INGUINAL ADULT;  Surgeon: Elsie GORMAN Holland, MD;  Location: [facility];  Service: General;  Laterality: Right;  site-inguinal area   OPEN REDUCTION, INTERNAL FIXATION (ORIF) CALCANEAL FRACTURE WITH FUSION Left 11/30/2018   Procedure: OPEN REDUCTION, INTERNAL FIXATION (ORIF) CALCANEAL FRACTURE WITH PERONEAL DEBRIDEMENT & REPAIR OF SUPERIOR PERONEAL RETINACULUM;  Surgeon: Elsa Lonni SAUNDERS, MD;  Location: [facility];  Service: Orthopedics;  Laterality: Left;   VASECTOMY     WRIST SURGERY Right    otif   HPI:  [name] is a 63 y/o gentleman admitted after a fall on 11/12. 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  ON 11/14, he began developing new seizure activity and was intubated until 11/18.    Assessment / Plan / Recommendation  Clinical Impression  Pt able to consume sips of water  and masticate solids. He has improving mentation and very mild dysphonia after 4 day intubation, is on continuous EEG. Pt is not able to take consecutive swallows of water  in a coordinated manner. He takes as much water  as possible into his mouth and needs instruction to swallow and continue sipping when attempting a 3 oz swallow. He did have resulting coughing. Given otherwise favorable clinical indicators for airway protection  recommend pt initiate regular foods and small sips of thin liquids with an upright posture. Will f/u for tolerance. If any concern for aspiration persists after EEG is over could proceed with instrumental assessment, but expect ongoing resolution.      Aspiration Risk  Mild aspiration risk    Diet Recommendation Regular;Thin liquid    Liquid Administration via: Cup;Straw Medication Administration: Whole meds with puree Supervision: Staff to assist with self feeding Compensations: Slow rate;Small sips/bites;Minimize environmental distractions Postural Changes: Seated upright at 90 degrees    Other  Recommendations Oral Care Recommendations: Oral care BID Caregiver Recommendations: Have oral suction available     Assistance Recommended at Discharge    Functional Status Assessment Patient has had a recent decline in their functional status and demonstrates the ability to make significant improvements in function in a reasonable and predictable amount of time.  Frequency and Duration min 1 x/week  2 weeks       Prognosis Prognosis for improved oropharyngeal function: Good      Swallow Study   General HPI: [name] is a 63 y/o gentleman admitted after a fall on 11/12. 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  ON 11/14, he began developing new seizure activity and was intubated until 11/18. Type of Study: Bedside Swallow Evaluation Previous Swallow Assessment: non Diet Prior to this Study: NPO;Cortrak/Small bore NG tube Temperature Spikes Noted: No History of Recent Intubation: Yes Total duration of intubation (days): 4 days Date extubated: 10/23/24 Behavior/Cognition: Alert;Cooperative Oral Cavity Assessment:  Within Functional Limits Oral Care Completed by SLP: No Oral Cavity - Dentition: Adequate natural dentition Vision: Functional for self-feeding Self-Feeding Abilities: Total assist Patient Positioning: Upright in bed Baseline Vocal Quality: Hoarse Volitional Cough:  Strong Volitional Swallow: Able to elicit    Oral/Motor/Sensory Function Overall Oral Motor/Sensory Function: Within functional limits   Ice Chips     Thin Liquid Thin Liquid: Impaired Pharyngeal  Phase Impairments: Cough - Immediate    Nectar Thick Nectar Thick Liquid: Not tested   Honey Thick Honey Thick Liquid: Not tested   Puree Puree: Within functional limits   Solid     Solid: Within functional limits      [name], [name] 10/24/2024,11:35 AM

## 2024-10-24 NOTE — Evaluation (Signed)
 Physical Therapy Evaluation Patient Details Name: [name] MRN: [id number] DOB: October 12, 1961 Today's Date: 10/24/2024  History of Present Illness  63 yo M adm 10/17/24 with weakness, fall, AMS, acute encephalopathy and sepsis. MRI with HSV encephalitis. 11/13 lumbar puncture. 11/14-11/18 VDRF. 11/14 seizure. PMhx: HLD, anxiety, hernia  Clinical Impression  Pt lethargic on arrival but able to open eyes and converse. Pt stating he lives with wife and dad (niece later confirmed dad has passed) and that year is 70. Pt with decreased strength, function, transfers, cognition, balance and will benefit from acute therapy to maximize mobility, safety and function to decrease burden of care. 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 Patient will benefit from intensive inpatient follow-up therapy, >3 hours/day  BP 108/77 HR 81        If plan is discharge home, recommend the following: A lot of help with walking and/or transfers;A lot of help with bathing/dressing/bathroom;Assistance with feeding;Assistance with cooking/housework;Direct supervision/assist for financial management;Supervision due to cognitive status;Assist for transportation;Help with stairs or ramp for entrance   Can travel by private vehicle        Equipment Recommendations Rolling walker (2 wheels)  Recommendations for Other Services  Rehab consult    Functional Status Assessment Patient has had a recent decline in their functional status and demonstrates the ability to make significant improvements in function in a reasonable and predictable amount of time.     Precautions / Restrictions Precautions Precautions: Fall;Other (comment) Recall of Precautions/Restrictions: Impaired Precaution/Restrictions Comments: LTM EEG, cortrak      Mobility  Bed Mobility Overal bed mobility: Needs Assistance Bed Mobility: Supine to Sit, Rolling Rolling: Min assist   Supine to sit: Mod  assist, +2 for physical assistance     General bed mobility comments: mod assist to elevate trunk from supine to long sitting with mod +2 to fully pivot hips to [name] and scoot forward    Transfers Overall transfer level: Needs assistance   Transfers: Sit to/from Stand Sit to Stand: Mod assist, +2 physical assistance, From elevated surface           General transfer comment: cues for hand placement with assist of pad x 2 trials from [name], pt stood with UB hooking on therapists to rise second trial. Pt able to side step toward [facility] with UB hooked and stood grossly 45 sec. Deferred up to chair due to LTM EEG and pt cogntion    Ambulation/Gait                  Stairs            Wheelchair Mobility     Tilt Bed    Modified Rankin (Stroke Patients Only)       Balance Overall balance assessment: Needs assistance Sitting-balance support: No upper extremity supported, Feet supported, Bilateral upper extremity supported Sitting balance-Leahy Scale: Poor Sitting balance - Comments: mod assist progressing to min assist sitting [name] with posterior bias Postural control: Posterior lean Standing balance support: Bilateral upper extremity supported, During functional activity, Reliant on assistive device for balance Standing balance-Leahy Scale: Poor Standing balance comment: physical assist for balance                             Pertinent Vitals/Pain Pain Assessment Pain Assessment: No/denies pain    Home Living Family/patient expects to be discharged to:: Private residence Living Arrangements: Spouse/significant other Available Help at  Discharge: Family;Available 24 hours/day Type of Home: Mobile home Home Access: Ramped entrance       Home Layout: One level Home Equipment: Agricultural Consultant (2 wheels);BSC/3in1;Cane - single point Additional Comments: PLOF and home setup provided by niece during session as pt providing false information    Prior  Function Prior Level of Function : Independent/Modified Independent;Driving;Working/employed             Mobility Comments: works in [employer] Extremity Assessment Upper Extremity Assessment: Defer to OT evaluation    Lower Extremity Assessment Lower Extremity Assessment: Generalized weakness    Cervical / Trunk Assessment Cervical / Trunk Assessment: Normal  Communication   Communication Communication: No apparent difficulties    Cognition Arousal: Lethargic Behavior During Therapy: Flat affect   PT - Cognitive impairments: Problem solving, Orientation, Awareness, Memory, Safety/Judgement   Orientation impairments: Time, Situation, Place                   PT - Cognition Comments: pt oriented to self and hospital but stating he is in [place] and year as 1989 Following commands: Impaired Following commands impaired: Follows one step commands with increased time, Follows one step commands inconsistently     Cueing Cueing Techniques: Verbal cues, Gestural cues, Tactile cues     General Comments      Exercises     Assessment/Plan    PT Assessment Patient needs continued PT services  PT Problem List Decreased strength;Decreased activity tolerance;Decreased balance;Decreased mobility;Decreased safety awareness;Decreased knowledge of use of DME;Decreased cognition;Decreased coordination       PT Treatment Interventions DME instruction;Gait training;Functional mobility training;Therapeutic activities;Therapeutic exercise;Patient/family education;Cognitive remediation;Neuromuscular re-education;Balance training    PT Goals (Current goals can be found in the Care Plan section)  Acute Rehab PT Goals PT Goal Formulation: Patient unable to participate in goal setting Time For Goal Achievement: 11/07/24 Potential to Achieve Goals: Good    Frequency Min 2X/week     Co-evaluation PT/OT/SLP Co-Evaluation/Treatment:  Yes Reason for Co-Treatment: Complexity of the patient's impairments (multi-system involvement);To address functional/ADL transfers PT goals addressed during session: Mobility/safety with mobility;Balance         AM-PAC PT 6 Clicks Mobility  Outcome Measure Help needed turning from your back to your side while in a flat bed without using bedrails?: A Little Help needed moving from lying on your back to sitting on the side of a flat bed without using bedrails?: A Lot Help needed moving to and from a bed to a chair (including a wheelchair)?: Total Help needed standing up from a chair using your arms (e.g., wheelchair or bedside chair)?: Total Help needed to walk in hospital room?: Total Help needed climbing 3-5 steps with a railing? : Total 6 Click Score: 9    End of Session Equipment Utilized During Treatment: Gait belt Activity Tolerance: Patient tolerated treatment well Patient left: in bed;with call bell/phone within reach;with bed alarm set;with family/visitor present;with restraints reapplied Nurse Communication: Mobility status PT Visit Diagnosis: Other abnormalities of gait and mobility (R26.89);Muscle weakness (generalized) (M62.81);Other symptoms and signs involving the nervous system (R29.898)    Time: 1129-1202 PT Time Calculation (min) (ACUTE ONLY): 33 min   Charges:   PT Evaluation $PT Eval Moderate Complexity: 1 Mod   PT General Charges $$ ACUTE PT VISIT: 1 Visit         Lenoard SQUIBB, PT Acute Rehabilitation Services Office: [phone]   Gracy Ehly B Ever Halberg 10/24/2024, 1:05 PM

## 2024-10-24 NOTE — Procedures (Addendum)
 Patient Name: [name]  MRN: [id number]  Epilepsy Attending: Arlin MALVA Krebs  Referring Physician/Provider: everitt Clint Abbey Earle FORBES, NP  Duration: 10/23/2024 1100 to 10/24/2024 1100   Patient history: 63yo M with ams. EEG to evaluate for seizure   Level of alertness: awake/lethargic   AEDs during EEG study: LEV   Technical aspects: This EEG study was done with scalp electrodes positioned according to the 10-20 International system of electrode placement. Electrical activity was reviewed with band pass filter of 1-70Hz , sensitivity of 7 uV/mm, display speed of 38mm/sec with a 60Hz  notched filter applied as appropriate. EEG data were recorded continuously and digitally stored.  Video monitoring was available and reviewed as appropriate.   Description: EEG showed continuous generalized 3 to 6 Hz theta-delta slowing. Hyperventilation and photic stimulation were not performed.    Of note, parts of study after 0100 on 10/24/2024 were difficult to interpret due to significant electrode artifact.     ABNORMALITY - Continuous slow, generalized   IMPRESSION: This study is suggestive of generalized cerebral dysfunction (encephalopathy). No seizures were seen throughout the recording.   Millie Shorb O Abu Heavin

## 2024-10-24 NOTE — Progress Notes (Signed)
 Rehook LTM maint complete - no skin breakdown seen [facility] monitored, Event button test confirmed by [facility].

## 2024-10-24 NOTE — Progress Notes (Signed)
 NAME:  [name], MRN:  [id number], DOB:  [birth year], LOS: 7 ADMISSION DATE:  10/17/2024, CONSULTATION DATE:  10/19/2024 REFERRING MD:  Michaela CHIEF COMPLAINT:  AMS   History of Present Illness:  [name] is a 63 year old male with a pertinent past medical history of arthritis, basal cell carcinoma of the left arm (2019), GERD, and a history of small bowel obstruction who presented to [facility] ED with concerns of generalized weakness and a fall on 11/12. For the past three weeks, the patient reports feeling unwell, having developed URI symptoms including congestion, headaches, cough, as well as some gastrointestinal symptoms like nauseas/vomiting and diarrhea. In the ED, patient was somnolent with worsening mental status. MRI of the head was done and showed abnormal diffusion with FLAIR signal abnormality of the insular left cortex, inferior left frontal lobe, and mesial left temporal lobe, raising concerns for acute encephalitis. Lumbar puncture was obtained, showing CSF with WBC 60, RBCs 2300, protein 92, glucose 55, and a meningitis panel positive for HSV1. He had been given Vancomycin , CTX, and Azithromycin  in the ED, and subsequently started on acyclovir . ON 11/14, he began developing new seizure activity, and neurology recommended PCCM consult for ICU transfer to maintain airway protection and initiate AEDs.   Patient was given Ativan  1mg  and Keppra  load. Wife was at bedside. Discussed with wife that patient will need to be transferred to ICU and urgently intubated.  Wife understood and consented to procedure verbally.  Pertinent  Medical History   Past Medical History:  Diagnosis Date   Arthritis    Calcaneus fracture, left    Cancer (HCC)    Skin cancer Left Arm   GERD (gastroesophageal reflux disease)    Small bowel obstruction (HCC)    Significant Hospital Events: Including procedures, antibiotic start and stop dates in addition to other pertinent events   11/12 admitted  for AMS, generalized weakness, fall 11/13 LP completed indicating HSV1 encephalitis 11/14 worsening altered mental status with seizure activity, started on AEDs, then transferred to ICU for intubation as pt could not protect airway 11/18 extubated and placed on 3L [place], saturating well  Interim History / Subjective:  Patient is laying comfortably in bed on room air. His voice is hoarse but overall is able to communicate. Slept comfortably, denies headaches or pain. Denies coughing and has no acute concerns.   His wife [name] was present yesterday but may not return to the floor today. She is to be updated by staffing as time progresses 11/19.   Objective   Blood pressure 121/78, pulse 77, temperature 99.7 F (37.6 C), temperature source Oral, resp. rate 20, height 5' 5 (1.651 m), weight 80.4 kg, SpO2 96%.        Intake/Output Summary (Last 24 hours) at 10/24/2024 1559 Last data filed at 10/24/2024 1500 Gross per 24 hour  Intake 2382.17 ml  Output 1915 ml  Net 467.17 ml   Filed Weights   10/22/24 0341 10/23/24 0419 10/24/24 0500  Weight: 80.6 kg 81.2 kg 80.4 kg   Examination: General: chronically ill-appearing male, NAD HENT: Normal conjunctivae, pupils reactive bilaterally, eyes anicteric, feeding tube placed in left nare Respiratory: Breathing comfortably on RA, chest clear to auscultation  Cardiovascular: RRR, no murmurs, rubs, gallops Abdomen: positive bowel sounds, soft, no tenderness to palpation Extremities: radial pulses 2+ bilaterally, no edema, PICC line placed in LUE without erythema Neuro: Slightly drowsy and confused. A&O to name, location, and month. Unable to state the year, president, or reason for hospitalization.  Follows commands appropriately and identify common objects (watch, ring). Communicates answers verbally.   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 - Continue acyclovir for 3 weeks per ID recommendations - Steroids started by Neuro for swelling, will continue Decadron  as risks seems less than potential benefit with worse edema. Neurology has ordered taper starting on 11/18 as follows:  Days 1-2 (11/18-11/19): 4mg  IV q 8 hrs Days 3-4: 4mg  IV q 12 hrs Days 5-6: 2mg  IV q 12 hrs Days 7-8: 2mg  IV daily Then discontinue - Will switch IV to PO Decadron  when pt can tolerate - Discontinue cEEG, still without new seizures - Continue keppra 1g BID; ativan  PRN - Seizure precautions - Aggressively treat fevers - Address hyponatremia; want to avoid free water . Switch LR with acyclovir to NS. Will trend BMP  Depression  P:  -Continue home sertraline   Septic shock 2/2 encephalitis Sedation related hypotension P:  - CXR final read showing no pulmonary opacity, pleural effusion, or PTX. ETT, and enteric feeding tube in place. Pt remains afebrile and without leukocytosis. - Concerns for aspiration pneumonia, mild hemoptysis likely 2/2 suction trauma.  - MRSA nares, Bcx, Ucx, all negative - Pending final trach aspirate culture, preliminary gram stain shows gram positive cocci/rods, and gram -ve rods, likely normal colonization  - LP suggests viral meningoencephalitis, CSF cx negative and HIV negative. - Finish Zosyn coverage for HAP today 11/19 since CXR negative but pending final trach aspirate. Linezolid stopped on 11/17 by ID.  - Continue Acyclovir (started 11/12) ID recommends continuing for 3 weeks (end date 12/3)  Respiratory: Acute hypoxemic respiratory failure requiring MV P:  - SAT and SBT passed, patient on room air saturating well  - Concerns for aspiration pneumonia, mild hemoptysis likely 2/2 suction trauma, improving  - SLP evaluated, pt able to sip  and masticate solids, able to swallow whole meds with puree - Stop feeding tubes today, trial to regular foods with small sips of thin liquids with upright posture - Last  Zosyn dose today, 11/19 for HAP coverage since pending final trach aspirate and CXR negative.  - LTVV - VAP prevention protocol - PT,OT, SLP as tolerated. PT/OT recommending CIR when patient is able to better tolerate. CIR admissions team will continue to follow   Gastrointestinal: GERD P: -Continue Protonix  40mg  BID   Constipation P:  - Continue Senna once daily  Endocrine: Hypergylcemia P: -Anticipate hyperglycemia with continued use of steroids -Increasing Semglee to 12 units daily given concurrent steroid use -Novolog  3u q4h for tube feeding coverage. If tube feeds stopped/held, then also stop/hold Novolog .  -SSI PRN -A1c 5.6%   Hypophosphatemia, resolved P:  - Improved today to 3.1 after supplementation  Critical care time: 30    Disposition:  Patient stable, plan to transfer out of ICU to neuro progressive tomorrow 11/20.   Aksh Swart, DO Internal Medicine Resident, PGY-1 Please contact the on call pager at [phone] for any urgent or emergent needs. 3:59 PM 10/24/2024

## 2024-10-24 NOTE — Progress Notes (Signed)
 NEUROLOGY CONSULT FOLLOW UP NOTE   Date of service: October 24, 2024 Patient Name: [name] MRN:  [id number] DOB:  05-10-1961  Interval Hx/subjective   NO family at the bedside. Patient was successfully extubated yesterday and is doing well today.  Remains on LTM.  NO new neurological events overnight.   Vitals   Vitals:   10/24/24 0748 10/24/24 0800 10/24/24 0900 10/24/24 1000  BP:  110/75 115/71 108/77  Pulse:  68 69 79  Resp:  (!) 42 (!) 37 (!) 23  Temp: 98.3 F (36.8 C)     TempSrc: Oral     SpO2:  100% 99% 97%  Weight:      Height:         Body mass index is 29.5 kg/m.  Physical Exam   Constitutional: Critically ill Psych: Affect appropriate to situation.  Eyes: No scleral injection.  HENT: No OP obstrucion.   feeding tube in place Head: Normocephalic.  Cardiovascular: Normal rate and regular rhythm.  Respiratory: even and normal  GI: Soft.  No distension. There is no tenderness.  Skin: WDI.   Neurologic Examination   Mental Status -  Lethargic, opens eyes spontaneously. He is confused, able to state his name, age56 year as 63 and president as [name]  Cranial Nerves II - XII - II - Visual field blinks to threat bilaterally III, IV, VI - Extraocular movements intact.  Tracking V - Facial sensation intact bilaterally. VII -symmetric VIII - Hearing & vestibular intact bilaterally. X - Palate elevates symmetrically. XI - Chin turning & shoulder shrug intact bilaterally. XII - Tongue protrusion intact.  Motor Strength -he is able to lift all 4 extremities antigravity and spontaneous bulk was normal and fasciculations were absent.   Motor Tone - Muscle tone was assessed at the neck and appendages and was normal. Sensory - Light touch, temperature/pinprick were assessed and were symmetrical.   Coordination - The patient had normal movements in the hands and feet with no ataxia or dysmetria.  Tremor was absent. Gait and Station -  deferred.  Medications  Current Facility-Administered Medications:    acetaminophen  (TYLENOL ) tablet 650 mg, 650 mg, Per Tube, Q6H PRN, 650 mg at 10/23/24 2105 **OR** acetaminophen  (TYLENOL ) suppository 650 mg, 650 mg, Rectal, Q6H PRN, [name], MD   acyclovir (ZOVIRAX) 800 mg in dextrose  5 % 250 mL IVPB, 800 mg, Intravenous, Q8H, [name], [name], MD, Stopped at 10/24/24 0606   Chlorhexidine  Gluconate Cloth 2 % PADS 6 each, 6 each, Topical, Daily, [name], [name], MD, 6 each at 10/24/24 0400   dexamethasone  (DECADRON ) injection 4 mg, 4 mg, Intravenous, Q8H, 4 mg at 10/24/24 0506 **FOLLOWED BY** [START ON 10/25/2024] dexamethasone  (DECADRON ) injection 4 mg, 4 mg, Intravenous, Q12H **FOLLOWED BY** [START ON 10/27/2024] dexamethasone  (DECADRON ) injection 2 mg, 2 mg, Intravenous, Q12H **FOLLOWED BY** [START ON 10/29/2024] dexamethasone  (DECADRON ) injection 2 mg, 2 mg, Intravenous, Q24H, [name], MD   enoxaparin  (LOVENOX ) injection 40 mg, 40 mg, Subcutaneous, Q24H, [name], MD, 40 mg at 10/23/24 1508   feeding supplement (OSMOLITE 1.5 CAL) liquid 1,000 mL, 1,000 mL, Per Tube, Continuous, [name], DO, Last Rate: 50 mL/hr at 10/24/24 1000, Infusion Verify at 10/24/24 1000   insulin  aspart (novoLOG ) injection 0-15 Units, 0-15 Units, Subcutaneous, Q4H, [name], DO, 2 Units at 10/24/24 0834   insulin  aspart (novoLOG ) injection 3 Units, 3 Units, Subcutaneous, Q4H, [name], [name], DO, 3 Units at 10/24/24 0834   insulin   glargine-yfgn (SEMGLEE) injection 12 Units, 12 Units, Subcutaneous, Daily, [name], [name], DO, 12 Units at 10/24/24 0948   levETIRAcetam (KEPPRA) undiluted injection 1,000 mg, 1,000 mg, Intravenous, Q12H, Michaela Aisha SQUIBB, MD, 1,000 mg at 10/24/24 0831   ondansetron  (ZOFRAN ) tablet 4 mg, 4 mg, Oral, Q6H PRN **OR** ondansetron  (ZOFRAN ) injection 4 mg, 4 mg, Intravenous, Q6H PRN, [name], [name], MD   Oral care mouth rinse, 15 mL,  Mouth Rinse, PRN, [name], [name], MD   pantoprazole  (PROTONIX ) injection 40 mg, 40 mg, Intravenous, Q24H, [name], [name], MD, 40 mg at 10/23/24 1718   piperacillin-tazobactam (ZOSYN) IVPB 3.375 g, 3.375 g, Intravenous, Q8H, Dang, Thuy D, RPH, Last Rate: 12.5 mL/hr at 10/24/24 1045, 3.375 g at 10/24/24 1045   polyethylene glycol (MIRALAX  / GLYCOLAX ) packet 17 g, 17 g, Oral, Daily PRN, [name], [name], MD   senna-docusate (Senokot-S) tablet 2 tablet, 2 tablet, Per Tube, BID, Gretta Leita SQUIBB, DO, 2 tablet at 10/23/24 9075   sodium chloride  flush (NS) 0.9 % injection 10-40 mL, 10-40 mL, Intracatheter, Q12H, [name], [name], MD, 20 mL at 10/24/24 0947   sodium chloride  flush (NS) 0.9 % injection 10-40 mL, 10-40 mL, Intracatheter, PRN, [name], MD  Labs and Diagnostic Imaging   CBC:  Recent Labs  Lab 10/17/24 1329 10/18/24 0658 10/21/24 0320 10/22/24 0540  WBC 13.1*   < > 6.1 6.9  NEUTROABS 11.5*  --   --   --   HGB 15.0   < > 14.1 12.9*  HCT 40.4   < > 38.8* 35.5*  MCV 90.0   < > 90.2 90.8  PLT 215   < > 167 192   < > = values in this interval not displayed.    Basic Metabolic Panel:  Lab Results  Component Value Date   NA 136 10/23/2024   K 4.1 10/23/2024   CO2 23 10/23/2024   GLUCOSE 209 (H) 10/23/2024   BUN 17 10/23/2024   CREATININE 0.68 10/23/2024   CALCIUM  8.8 (L) 10/23/2024   GFRNONAA >60 10/23/2024   GFRAA 92 01/16/2021   Lipid Panel:  Lab Results  Component Value Date   LDLCALC 155 (H) 06/13/2024   HgbA1c:  Lab Results  Component Value Date   HGBA1C 5.6 10/23/2024   Urine Drug Screen: No results found for: LABOPIA, COCAINSCRNUR, LABBENZ, AMPHETMU, THCU, LABBARB  Alcohol Level No results found for: [facility] INR  Lab Results  Component Value Date   INR 1.1 10/17/2024   APTT  Lab Results  Component Value Date   APTT 26 11/19/2019   AED levels: No results found for: PHENYTOIN, ZONISAMIDE, LAMOTRIGINE,  LEVETIRACETA  Labs and Diagnostic Imaging  CSF WBC 60 CSF RBC 3200(suspect mildly traumatic given colorless supernatant) CSF protein 92 CSF glucose 55 CSF meningitis panel (+) for HSV1    MRI brain wwo 11/12 Abnormal diffusion and FLAIR signal abnormality involving the left insular cortex and inferior left frontal lobe, as well as the mesial left temporal lobe 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 Areas of intermixed mild hyperdensity is suspicious for non-mass occupying acute hemorrhage. Repeat MRI with contrast could further characterize if clinically warranted.  LTM 11/16 - 11/17 : ABNORMALITY - Continuous slow, generalized and lateralized left hemisphere IMPRESSION: This  study is showed cortical dysfunction arising from left hemisphere likely secondary to underlying structural abnormality. Additionally there is generalized cerebral dysfunction (encephalopathy). No seizures were seen throughout the recording.  LTM 11/17-11/18: ABNORMALITY - Continuous slow, generalized and lateralized left hemisphere   IMPRESSION: This study is showed cortical dysfunction arising from left hemisphere likely secondary to underlying structural abnormality. Additionally there is generalized cerebral dysfunction (encephalopathy). No seizures were seen throughout the recording.  LTM 11/18-11/19: ABNORMALITY - Continuous slow, generalized   IMPRESSION: This study is suggestive of generalized cerebral dysfunction (encephalopathy). No seizures were seen throughout the recording.   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  Keppra increased to 1g bid on 11/16 2/2 continued irritability on EEG.   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 Decadron  started. Weak evidence to support decadron  is HSV encephalitis in general but given degree of cerebral edema we did not have much of a choice. His R sided weakness has improved since starting the decadron .  Recommendations  -Continue Keppra 1g twice daily -discontinue LTM EEG -Appreciate ID consults continuing to follow and provide guidance  Decadron  taper as follows: Days 1-2 (11/18-11/19): 4mg  IV q 8 hrs Days 3-4: 4mg  IV q 12 hrs Days 5-6: 2mg  IV q 12 hrs Days 7-8: 2mg  IV daily Then discontinue Neurology will continue to follow   Signed, Karna DELENA Geralds, NP [facility]   Attending Neurohospitalist Addendum Patient seen and examined with APP/Resident. Agree with the history and physical as documented above. Agree with the plan as documented, which I helped formulate. I have edited the note above to reflect my full findings and recommendations. I have independently reviewed the chart, obtained history, review of systems and examined the patient.I have personally reviewed pertinent head/neck/spine imaging (CT/MRI). Please feel free to call with any questions.  This patient is critically ill and at significant risk of neurological worsening, death and care requires constant monitoring of vital signs, hemodynamics,respiratory and cardiac monitoring, neurological assessment, discussion with family, other specialists and medical decision making of high complexity. I spent 45 minutes of neurocritical care time  in the care of  this patient. This was time spent independent of any time provided by nurse practitioner or PA.  Elida Ross, MD [facility] [phone]  If 7pm-  7am, please page neurology on call as listed in AMION.

## 2024-10-24 NOTE — Tx Team (Signed)
 Report given to  Reden RN, patient transferred via bed, with monitor and EEG machine and belongings, vital signs stable, no acute distress, no seizure activity noted.  Plan of care ongoing

## 2024-10-24 NOTE — Evaluation (Signed)
 Occupational Therapy Evaluation Patient Details Name: [name] MRN: [id number] DOB: May 20, 1961 Today's Date: 10/24/2024   History of Present Illness   63 yo M adm 10/17/24 with weakness, fall, AMS, acute encephalopathy and sepsis. MRI with HSV encephalitis. 11/13 lumbar puncture. 11/14-11/18 VDRF. 11/14 seizure. PMhx: HLD, anxiety, hernia     Clinical Impressions At baseline, pt is Independent with ADLs, IADLs, and functional mobility, and drives and works. Pt now presents with decreased activity tolerance, decreased cognition, decreased balance, decreased B UE coordination, generalized B UE weakness, and decreased safety and independence with functional tasks. Pt currently demonstrates ability to complete ADLs with Min to Total assist +2, bed mobility with Min to Mod assist +2, and sit to stand transfers with Mod assist +2. Pt currently requiring cues for safety, sequencing, and compensatory strategies. Pt participated well in session and has a supportive family. Pt VSS on RA throughout session. Pt will benefit from acute OT services to address deficits and increase safety and independence with functional tasks. Post acute discharge, pt will benefit from intensive inpatient skilled rehab services > 3 hours per day to maximize rehab potential.      If plan is discharge home, recommend the following:   Two people to help with walking and/or transfers;Two people to help with bathing/dressing/bathroom;Assistance with cooking/housework;Assistance with feeding;Direct supervision/assist for medications management;Direct supervision/assist for financial management;Assist for transportation;Help with stairs or ramp for entrance;Supervision due to cognitive status     Functional Status Assessment   Patient has had a recent decline in their functional status and demonstrates the ability to make significant improvements in function in a reasonable and predictable amount of time.      Equipment Recommendations   Tub/shower seat;Other (comment) (other TBD based on pt progress)     Recommendations for Other Services   Rehab consult     Precautions/Restrictions   Precautions Precautions: Fall;Other (comment) Recall of Precautions/Restrictions: Impaired Precaution/Restrictions Comments: LTM EEG, cortrak Restrictions Weight Bearing Restrictions Per Provider Order: No     Mobility Bed Mobility Overal bed mobility: Needs Assistance Bed Mobility: Supine to Sit, Rolling Rolling: Min assist   Supine to sit: Mod assist, +2 for physical assistance     General bed mobility comments: mod assist to elevate trunk from supine to long sitting with mod +2 to fully pivot hips to [name] and scoot forward    Transfers Overall transfer level: Needs assistance Equipment used: None Transfers: Sit to/from Stand Sit to Stand: Mod assist, +2 physical assistance, From elevated surface           General transfer comment: cues for hand placement with assist of pad x 2 trials from [name], pt stood with UB hooking on therapists to rise second trial. Pt able to side step toward [facility] with UB hooked and stood grossly 45 sec. Deferred up to chair due to LTM EEG and pt cogntion      Balance Overall balance assessment: Needs assistance Sitting-balance support: Bilateral upper extremity supported, No upper extremity supported, Feet supported Sitting balance-Leahy Scale: Poor Sitting balance - Comments: mod assist progressing to CGA-min assist sitting [name] with posterior bias Postural control: Posterior lean Standing balance support: Bilateral upper extremity supported, During functional activity, Reliant on assistive device for balance Standing balance-Leahy Scale: Poor Standing balance comment: physical assist for balance                           ADL either performed or assessed with clinical  judgement   ADL Overall ADL's : Needs assistance/impaired Eating/Feeding:  Minimal assistance;Cueing for sequencing;Cueing for safety;Sitting   Grooming: Minimal assistance;Cueing for safety;Cueing for compensatory techniques;Bed level   Upper Body Bathing: Moderate assistance;Cueing for sequencing;Cueing for compensatory techniques;Sitting   Lower Body Bathing: Maximal assistance;Total assistance;Cueing for safety;Cueing for compensatory techniques;Cueing for sequencing;Bed level;+2 for safety/equipment   Upper Body Dressing : Moderate assistance;Cueing for sequencing;Sitting   Lower Body Dressing: Total assistance;Bed level;Cueing for sequencing;Cueing for compensatory techniques;Cueing for safety;+2 for safety/equipment     Toilet Transfer Details (indicate cue type and reason): deferred this session for pt/therapist safety Toileting- Clothing Manipulation and Hygiene: Total assistance;+2 for safety/equipment;Bed level               Vision Baseline Vision/History: 1 Wears glasses Ability to See in Adequate Light: 0 Adequate (with glasses on) Patient Visual Report: No change from baseline Additional Comments: Vision [facility] for tasks assessed with pt wearing his glasses; vision not formally screened or evaluated     Perception         Praxis         Pertinent Vitals/Pain Pain Assessment Pain Assessment: No/denies pain     Extremity/Trunk Assessment Upper Extremity Assessment Upper Extremity Assessment: Right hand dominant;RUE deficits/detail;LUE deficits/detail;Generalized weakness RUE Deficits / Details: generalized weakness; decreased proprioception with sensation otherwise WFL; decreased coordination; AROM WFL; mildly edematous hand and wrist RUE Sensation: decreased proprioception RUE Coordination: decreased fine motor;decreased gross motor LUE Deficits / Details: generalized weakness; decreased proprioception with sensation otherwise WFL; decreased coordination; AROM WFL; mildly edematous hand and wrist LUE Sensation: decreased  proprioception LUE Coordination: decreased fine motor;decreased gross motor   Lower Extremity Assessment Lower Extremity Assessment: Defer to PT evaluation   Cervical / Trunk Assessment Cervical / Trunk Assessment: Normal   Communication Communication Communication: No apparent difficulties   Cognition Arousal: Lethargic, Alert (Initially lethargic, but becoming more alert with activity and with the arrival of his niece) Behavior During Therapy: Flat affect Cognition: Cognition impaired   Orientation impairments: Place, Time, Situation (Pt able to state he is in a hospital, but provided wrong hospital name and city. Pt stating it is 1989. Pt stating he is in the hospital due to his back. Pt also stating he lives with his wife and father with niece reporting his father passed years ago.) Awareness: Intellectual awareness impaired, Online awareness impaired Memory impairment (select all impairments): Short-term memory, Working memory, Engineer, structural memory Attention impairment (select first level of impairment): Sustained attention Executive functioning impairment (select all impairments): Initiation, Organization, Sequencing, Reasoning, Problem solving OT - Cognition Comments: Pt oriented to self and niece and pleasantly confused throughout session. Niece reports pt cognition is WNL at baseline.                 Following commands: Impaired Following commands impaired: Follows one step commands with increased time, Follows one step commands inconsistently     Cueing  General Comments   Cueing Techniques: Verbal cues;Gestural cues;Tactile cues  VSS on RA. RN present and assisting during a portion of session. Pt's niece present and supportive through a portion of session.   Exercises     Shoulder Instructions      Home Living Family/patient expects to be discharged to:: Private residence Living Arrangements: Spouse/significant other (wife) Available Help at  Discharge: Family;Available 24 hours/day Type of Home: Mobile home Home Access: Ramped entrance     Home Layout: One level     Bathroom Shower/Tub: Tub/shower unit;Walk-in shower  Bathroom Toilet: Standard     Home Equipment: Agricultural Consultant (2 wheels);BSC/3in1;Cane - single point   Additional Comments: PLOF and home setup provided by niece during session as pt not a reliable reporter and providing conflicting information      Prior Functioning/Environment Prior Level of Function : Independent/Modified Independent;Driving;Working/employed             Mobility Comments: At baseline, pt is Ind for mobility without an AD. Report ADLs Comments: At baseline, pt is Independent with ADLs and ADLs. Pt drives and works in barista.    OT Problem List: Decreased strength;Decreased activity tolerance;Impaired balance (sitting and/or standing);Decreased coordination;Decreased cognition;Decreased safety awareness;Decreased knowledge of use of DME or AE;Decreased knowledge of precautions   OT Treatment/Interventions: Self-care/ADL training;Therapeutic exercise;DME and/or AE instruction;Therapeutic activities;Cognitive remediation/compensation;Patient/family education;Balance training      OT Goals(Current goals can be found in the care plan section)   Acute Rehab OT Goals Patient Stated Goal: pt unable to state OT Goal Formulation: With family (niece) Time For Goal Achievement: 11/07/24 Potential to Achieve Goals: Good ADL Goals Pt Will Perform Eating: with modified independence;sitting Pt Will Perform Grooming: with set-up;with supervision;sitting (sitting [name] with Good balance for 3 or more minutes) Pt Will Perform Upper Body Bathing: with contact guard assist;sitting Pt Will Perform Lower Body Dressing: with mod assist;sitting/lateral leans;sit to/from stand Pt Will Transfer to Toilet: with mod assist;ambulating;bedside commode (with least restrictive AD) Pt  Will Perform Toileting - Clothing Manipulation and hygiene: with mod assist;sitting/lateral leans;sit to/from stand Additional ADL Goal #1: Patient will demonstrate ability to appropriately sequence a familiar 3-step functional or therapuetic task with Mod I in 4/5 opportunities.   OT Frequency:  Min 2X/week    Co-evaluation PT/OT/SLP Co-Evaluation/Treatment: Yes Reason for Co-Treatment: Complexity of the patient's impairments (multi-system involvement);To address functional/ADL transfers PT goals addressed during session: Mobility/safety with mobility;Balance OT goals addressed during session: ADL's and self-care;Strengthening/ROM;Other (comment) (Cognition)      AM-PAC OT 6 Clicks Daily Activity     Outcome Measure Help from another person eating meals?: A Little Help from another person taking care of personal grooming?: A Little Help from another person toileting, which includes using toliet, bedpan, or urinal?: Total Help from another person bathing (including washing, rinsing, drying)?: A Lot Help from another person to put on and taking off regular upper body clothing?: A Lot Help from another person to put on and taking off regular lower body clothing?: Total 6 Click Score: 12   End of Session Equipment Utilized During Treatment: Gait belt Nurse Communication: Mobility status;Other (comment) (Pt noted to have had a BM upon OT/PT arrival)  Activity Tolerance: Patient tolerated treatment well Patient left: in bed;with call bell/phone within reach;with bed alarm set;with restraints reapplied;with family/visitor present  OT Visit Diagnosis: Unsteadiness on feet (R26.81);Other abnormalities of gait and mobility (R26.89);Muscle weakness (generalized) (M62.81);Other symptoms and signs involving cognitive function                Time: 1129-1202 OT Time Calculation (min): 33 min Charges:  OT General Charges $OT Visit: 1 Visit OT Evaluation $OT Eval Moderate Complexity: 1 Mod OT  Treatments $Self Care/Home Management : 8-22 mins  Margarie Rockey HERO., OTR/L, MA Acute Rehab [phone]   [name] 10/24/2024, 5:14 PM

## 2024-10-24 NOTE — TOC Progression Note (Signed)
 Transition of Care [facility]) - Progression Note    Patient Details  Name: [name] MRN: [id number] Date of Birth: 30-Jan-1961  [facility]) CM/SW Contact  Lauraine FORBES Saa, LCSWA Phone Number: 10/24/2024, 2:20 PM  Clinical Narrative:     2:20 PM Per chart review, therapy recommended patient discharge to [facility]. Patient is not yet able to tolerate [facility], but [facility] admissions will continue to monitor for progress. Per progressions, patient has been extubated and is anticipated to transfer to progressive unit, but remains with PICC. TOC will continue to follow.   Expected Discharge Plan: IP Rehab Facility Barriers to Discharge: Continued Medical Work up, English As A Second Language Teacher               Expected Discharge Plan and Services In-house Referral: Clinical Social Work Discharge Planning Services: CM Consult   Living arrangements for the past 2 months: Single Family Home                                       Social Drivers of Health (SDOH) Interventions SDOH Screenings   Food Insecurity: No Food Insecurity (10/17/2024)  Housing: Low Risk  (10/17/2024)  Transportation Needs: No Transportation Needs (10/17/2024)  Utilities: Not At Risk (10/17/2024)  Alcohol Screen: Low Risk  (09/27/2024)  Depression (PHQ2-9): Low Risk  (06/13/2024)  Financial Resource Strain: Low Risk  (09/27/2024)  Physical Activity: Insufficiently Active (09/27/2024)  Social Connections: Moderately Isolated (09/27/2024)  Stress: No Stress Concern Present (09/27/2024)  Tobacco Use: Low Risk  (10/17/2024)    Readmission Risk Interventions     No data to display

## 2024-10-25 ENCOUNTER — Inpatient Hospital Stay (HOSPITAL_COMMUNITY)

## 2024-10-25 DIAGNOSIS — R569 Unspecified convulsions: Secondary | ICD-10-CM | POA: Diagnosis not present

## 2024-10-25 DIAGNOSIS — R4182 Altered mental status, unspecified: Secondary | ICD-10-CM | POA: Diagnosis not present

## 2024-10-25 DIAGNOSIS — B1009 Other human herpesvirus encephalitis: Secondary | ICD-10-CM | POA: Diagnosis not present

## 2024-10-25 DIAGNOSIS — G936 Cerebral edema: Secondary | ICD-10-CM | POA: Diagnosis not present

## 2024-10-25 LAB — GLUCOSE, CAPILLARY
Glucose-Capillary: 98 mg/dL (ref 70–99)
Glucose-Capillary: 105 mg/dL — ABNORMAL HIGH (ref 70–99)
Glucose-Capillary: 104 mg/dL — ABNORMAL HIGH (ref 70–99)
Glucose-Capillary: 111 mg/dL — ABNORMAL HIGH (ref 70–99)
Glucose-Capillary: 98 mg/dL (ref 70–99)

## 2024-10-25 LAB — OLIGOCLONAL BANDS, CSF + SERM

## 2024-10-25 MED ORDER — INSULIN GLARGINE-YFGN 100 UNIT/ML ~~LOC~~ SOLN
8.0000 [IU] | Freq: Every day | SUBCUTANEOUS | Status: DC
  Administered 2024-10-26: 8 [IU] via SUBCUTANEOUS
  Filled 2024-10-25: qty 0.08

## 2024-10-25 MED ORDER — ACETAMINOPHEN 325 MG PO TABS: 650.0000 mg | ORAL_TABLET | Freq: Four times a day (QID) | ORAL | Status: DC | PRN

## 2024-10-25 MED ORDER — SODIUM CHLORIDE 0.9 % IV SOLN: INTRAVENOUS | Status: DC

## 2024-10-25 MED ORDER — INSULIN ASPART 100 UNIT/ML IJ SOLN
0.0000 [IU] | Freq: Three times a day (TID) | INTRAMUSCULAR | Status: DC
  Administered 2024-10-26: 2 [IU] via SUBCUTANEOUS
  Filled 2024-10-25: qty 2

## 2024-10-25 MED ORDER — MIRTAZAPINE 15 MG PO TABS
7.5000 mg | ORAL_TABLET | Freq: Every day | ORAL | Status: DC
  Administered 2024-10-25 – 2024-10-29 (×5): 7.5 mg via ORAL
  Filled 2024-10-25 (×5): qty 1

## 2024-10-25 MED ORDER — SENNA 8.6 MG PO TABS
1.0000 | ORAL_TABLET | Freq: Every day | ORAL | Status: DC
  Administered 2024-10-25 – 2024-10-30 (×6): 8.6 mg via ORAL
  Filled 2024-10-25 (×6): qty 1

## 2024-10-25 NOTE — Progress Notes (Incomplete)
 NEUROLOGY CONSULT FOLLOW UP NOTE   Date of service: October 25, 2024 Patient Name: [name] MRN:  [id number] DOB:  Aug 29, 1961  Interval Hx/subjective   LTM to be discontinued this am, as routine and continuous read both negative.  Continues on Decadron  taper.   On exam, ***  Vitals   Vitals:   10/25/24 0100 10/25/24 0251 10/25/24 0345 10/25/24 0826  BP:  (!) 158/72 119/76 (!) 184/66  Pulse:  66 66 64  Resp: 20 20 20  (!) 22  Temp:  98.4 F (36.9 C) 98.2 F (36.8 C) 97.7 F (36.5 C)  TempSrc:  Oral Oral Oral  SpO2:  99% 97% 96%  Weight:      Height:         Body mass index is 29.5 kg/m.  Physical Exam   Constitutional: Critically ill Psych: Affect appropriate to situation.  Eyes: No scleral injection.  HENT: No OP obstrucion.   feeding tube in place Head: Normocephalic.  Cardiovascular: Normal rate and regular rhythm.  Respiratory: even and normal  GI: Soft.  No distension. There is no tenderness.  Skin: WDI.   Neurologic Examination   Mental Status -  Lethargic, opens eyes spontaneously. He is confused, able to state his name, age63 year as 53 and president as [name]  Cranial Nerves II - XII - II - Visual field blinks to threat bilaterally III, IV, VI - Extraocular movements intact.  Tracking V - Facial sensation intact bilaterally. VII -symmetric VIII - Hearing & vestibular intact bilaterally. X - Palate elevates symmetrically. XI - Chin turning & shoulder shrug intact bilaterally. XII - Tongue protrusion intact.  Motor Strength -he is able to lift all 4 extremities antigravity and spontaneous bulk was normal and fasciculations were absent.   Motor Tone - Muscle tone was assessed at the neck and appendages and was normal. Sensory - Light touch, temperature/pinprick were assessed and were symmetrical.   Coordination - The patient had normal movements in the hands and feet with no ataxia or dysmetria.  Tremor was absent. Gait and Station -  deferred.  Medications  Current Facility-Administered Medications:    acetaminophen  (TYLENOL ) tablet 650 mg, 650 mg, Per Tube, Q6H PRN, 650 mg at 10/23/24 2105 **OR** acetaminophen  (TYLENOL ) suppository 650 mg, 650 mg, Rectal, Q6H PRN, [name], MD   acyclovir  (ZOVIRAX ) 800 mg in dextrose  5 % 250 mL IVPB, 800 mg, Intravenous, Q8H, [name], [name], MD, Last Rate: 266 mL/hr at 10/25/24 0533, 800 mg at 10/25/24 0533   Chlorhexidine  Gluconate Cloth 2 % PADS 6 each, 6 each, Topical, Daily, [name], [name], MD, 6 each at 10/24/24 1100   dexamethasone  (DECADRON ) injection 4 mg, 4 mg, Intravenous, Q8H, 4 mg at 10/25/24 0530 **FOLLOWED BY** dexamethasone  (DECADRON ) injection 4 mg, 4 mg, Intravenous, Q12H **FOLLOWED BY** [START ON 10/27/2024] dexamethasone  (DECADRON ) injection 2 mg, 2 mg, Intravenous, Q12H **FOLLOWED BY** [START ON 10/29/2024] dexamethasone  (DECADRON ) injection 2 mg, 2 mg, Intravenous, Q24H, [name], MD   enoxaparin  (LOVENOX ) injection 40 mg, 40 mg, Subcutaneous, Q24H, [name], MD, 40 mg at 10/24/24 1455   feeding supplement (OSMOLITE 1.5 CAL) liquid 1,000 mL, 1,000 mL, Per Tube, Continuous, [name], DO, Stopped at 10/24/24 1200   insulin  aspart (novoLOG ) injection 0-15 Units, 0-15 Units, Subcutaneous, Q4H, [name], DO, 2 Units at 10/24/24 0834   insulin  glargine-yfgn (SEMGLEE ) injection 12 Units, 12 Units, Subcutaneous, Daily, [name], [name], DO, 12 Units at 10/24/24 0948   levETIRAcetam  (  KEPPRA ) tablet 1,000 mg, 1,000 mg, Oral, BID, Stretch, [name], MD, 1,000 mg at 10/24/24 2230   ondansetron  (ZOFRAN ) tablet 4 mg, 4 mg, Oral, Q6H PRN **OR** ondansetron  (ZOFRAN ) injection 4 mg, 4 mg, Intravenous, Q6H PRN, [name], [name], MD   Oral care mouth rinse, 15 mL, Mouth Rinse, PRN, [name], [name], MD   pantoprazole  (PROTONIX ) EC tablet 40 mg, 40 mg, Oral, QHS, Stretch, [name], MD, 40 mg at 10/24/24 2230   polyethylene glycol (MIRALAX   / GLYCOLAX ) packet 17 g, 17 g, Oral, Daily PRN, [name], [name], MD   senna-docusate (Senokot-S) tablet 1 tablet, 1 tablet, Per Tube, Daily, [name], [name], DO   sodium chloride  flush (NS) 0.9 % injection 10-40 mL, 10-40 mL, Intracatheter, Q12H, [name], [name], MD, 10 mL at 10/24/24 2240   sodium chloride  flush (NS) 0.9 % injection 10-40 mL, 10-40 mL, Intracatheter, PRN, [name], MD  Labs and Diagnostic Imaging   CBC:  Recent Labs  Lab 10/21/24 0320 10/22/24 0540  WBC 6.1 6.9  HGB 14.1 12.9*  HCT 38.8* 35.5*  MCV 90.2 90.8  PLT 167 192    Basic Metabolic Panel:  Lab Results  Component Value Date   NA 136 10/23/2024   K 4.1 10/23/2024   CO2 23 10/23/2024   GLUCOSE 209 (H) 10/23/2024   BUN 17 10/23/2024   CREATININE 0.68 10/23/2024   CALCIUM  8.8 (L) 10/23/2024   GFRNONAA >60 10/23/2024   GFRAA 92 01/16/2021   Lipid Panel:  Lab Results  Component Value Date   LDLCALC 155 (H) 06/13/2024   HgbA1c:  Lab Results  Component Value Date   HGBA1C 5.6 10/23/2024   Urine Drug Screen: No results found for: LABOPIA, COCAINSCRNUR, LABBENZ, AMPHETMU, THCU, LABBARB  Alcohol Level No results found for: [facility] INR  Lab Results  Component Value Date   INR 1.1 10/17/2024   APTT  Lab Results  Component Value Date   APTT 26 11/19/2019   AED levels: No results found for: PHENYTOIN, ZONISAMIDE, LAMOTRIGINE, LEVETIRACETA  Labs and Diagnostic Imaging  CSF WBC 60 CSF RBC 3200(suspect mildly traumatic given colorless supernatant) CSF protein 92 CSF glucose 55 CSF meningitis panel (+) for HSV1    MRI brain wwo 11/12 Abnormal diffusion and FLAIR signal abnormality involving the left insular cortex and inferior left frontal lobe, as well as the mesial left temporal lobe 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 Areas of intermixed mild hyperdensity is suspicious for non-mass occupying acute hemorrhage. Repeat MRI with contrast could further characterize if clinically warranted.  LTM 11/16 - 11/17 : ABNORMALITY - Continuous slow, generalized and lateralized left hemisphere IMPRESSION: This study is showed cortical dysfunction arising from left hemisphere likely secondary to underlying structural abnormality. Additionally there is generalized cerebral dysfunction (encephalopathy). No seizures were seen throughout the recording.  LTM 11/17-11/18: ABNORMALITY - Continuous slow, generalized and lateralized left hemisphere   IMPRESSION: This study is showed cortical dysfunction arising from left hemisphere likely secondary to underlying structural abnormality. Additionally there is generalized cerebral dysfunction (encephalopathy). No seizures were seen throughout the recording.  LTM 11/18-11/19: ABNORMALITY - Continuous slow, generalized   IMPRESSION: This study is suggestive of generalized cerebral dysfunction (encephalopathy). No seizures were seen throughout the recording.   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  Keppra  increased to 1g bid on 11/16 2/2 continued irritability on EEG.   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 Decadron  started. Weak evidence to support decadron  is HSV encephalitis in general but given degree of cerebral edema we did not have much of a choice. His R sided weakness has improved since starting the decadron .  Recommendations  -Continue Keppra  1g  twice daily -discontinue LTM EEG -Appreciate ID consults continuing to follow and provide guidance  Decadron  taper as follows: Days 1-2 (11/18-11/19): 4mg  IV q 8 hrs Days 3-4: 4mg  IV q 12 hrs Days 5-6: 2mg  IV q 12 hrs Days 7-8: 2mg  IV daily Then discontinue Neurology will continue to follow    If 7pm- 7am, please page neurology on call as listed in [facility]ION.

## 2024-10-25 NOTE — Procedures (Addendum)
 Patient Name: [name]  MRN: [id number]  Epilepsy Attending: Arlin MALVA Krebs  Referring Physician/Provider: everitt Clint Abbey Earle FORBES, NP  Duration: 10/24/2024 1100 to 10/25/2024 9083   Patient history: 63yo M with ams. EEG to evaluate for seizure   Level of alertness: awake/lethargic   AEDs during EEG study: LEV   Technical aspects: This EEG study was done with scalp electrodes positioned according to the 10-20 International system of electrode placement. Electrical activity was reviewed with band pass filter of 1-70Hz , sensitivity of 7 uV/mm, display speed of 71mm/sec with a 60Hz  notched filter applied as appropriate. EEG data were recorded continuously and digitally stored.  Video monitoring was available and reviewed as appropriate.   Description: EEG showed continuous generalized 3 to 6 Hz theta-delta slowing. Hyperventilation and photic stimulation were not performed.     ABNORMALITY - Continuous slow, generalized   IMPRESSION: This study is suggestive of generalized cerebral dysfunction (encephalopathy). No seizures were seen throughout the recording.   Lajoya Dombek O Delylah Stanczyk

## 2024-10-25 NOTE — Progress Notes (Addendum)
 Speech Language Pathology Treatment: Dysphagia  Patient Details Name: [name] MRN: [id number] DOB: 11-Dec-1960 Today's Date: 10/25/2024 Time: 8593-8579 SLP Time Calculation (min) (ACUTE ONLY): 14 min  Assessment / Plan / Recommendation Clinical Impression  MBS scheduled for tomorrow. Continue regular texture, thin liquids, small sips, upright position and pills whole in puree  Wife reported he coughed several minutes after eating meal. He demonstrated aphonia during all verbalization attempts but able to produce volitional throat clear with audible phonation. Throat clearing was not constant however consistent after sips warranting further assessment with objective testing. May have reduced airway protection if vocal cords adduction is decreased. During initial swallow assessment yesterday SLP noted only mild dysphonia. Pt was sleeping on therapists arrival today, drowsy which may contribute to decreased phonation if insufficient respiratory effort. Wife states he clears his throat at home sometimes with meals and takes meds for reflux. Several instances of eructation noted. Pt and wife agreeable to MBS.    HPI HPI: [name] is a 63 y/o gentleman admitted after a fall on 11/12. 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  ON 11/14, he began developing new seizure activity and was intubated until 11/18.      SLP Plan  MBS          Recommendations  Diet recommendations: Regular;Thin liquid Liquids provided via: Cup;Straw Medication Administration: Whole meds with puree Supervision: Staff to assist with self feeding Compensations: Slow rate;Small sips/bites;Minimize environmental distractions Postural Changes and/or Swallow Maneuvers: Seated upright 90 degrees                Rehab consult Oral care BID   Intermittent Supervision/Assistance Dysphagia, unspecified (R13.10)     MBS     [name]  10/25/2024, 2:33 PM

## 2024-10-25 NOTE — Progress Notes (Signed)
 vLTM discontinued  No skin breakdown noted at all skin sites  [facility] notified

## 2024-10-25 NOTE — Progress Notes (Incomplete)
 Nutrition Follow-up  DOCUMENTATION CODES:  Not applicable  INTERVENTION:  Adjust tube feeding via cortrak: Osmolite 1.5 at 50 ml/h (1200 ml per day) Prosource TF20 60 ml BID Provides 1960 kcal, 115 gm protein, 914 ml free water  daily  NUTRITION DIAGNOSIS:  Inadequate oral intake related to lethargy/confusion as evidenced by NPO status. - progressing, diet now advancing  GOAL:   Patient will meet greater than or equal to 90% of their needs  ***  MONITOR:   TF tolerance, Labs, I & O's  REASON FOR ASSESSMENT:   Consult Enteral/tube feeding initiation and management  ASSESSMENT:   Patient presented with weakness s/p fall, nausea/vomiting/diarrhea, persistent headache and was found to have acute encephalopathy likely encephalitis/meningitis with severe sepsis and respiratory failure. PMH significant for SBO, GERD, and arthritis.  11/12 - presented to ED, LP x 2 attempted in ED unsuccessfully 11/13 - LP in IR 11/14 - rapid response called, intubated and transferred to ICU, cortrak placed 11/18 - extubated 11/19 - diet advanced to regular  Admit weight: 80.7 kg  Current weight: 80.4 kg   Medications: decadron  taper, SSI 0-15 units q4h, semglee  12 units daily, senna daily  Labs, reviewed, last updated 11/18 CBG's 94-113 x24 hours    Diet Order:   Diet Order             Diet regular Room service appropriate? Yes; Fluid consistency: Thin  Diet effective now                   EDUCATION NEEDS:   Not appropriate for education at this time  Skin:  Skin Assessment: Reviewed RN Assessment  Last BM:  11/18 - type 6  Height:   Ht Readings from Last 1 Encounters:  10/17/24 5' 5 (1.651 m)    Weight:   Wt Readings from Last 1 Encounters:  10/24/24 80.4 kg    Ideal Body Weight:  62 kg  BMI:  Body mass index is 29.5 kg/m.  Estimated Nutritional Needs:   Kcal:  1900-2200  Protein:  100-115 g/d  Fluid:  >/=1.9L/d    ***

## 2024-10-25 NOTE — Progress Notes (Signed)
 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 Primary team had started HAP coverage after 2 days of admission. Trach cx sent 11/17 in process. Mrsa nares pcr negative. Afebrile/no leukocytosis; minimal vent requirement   10/23/24 id assessment Cxr confirmed no opacity per official read. Patient extubated today. Appropriate and interact with me no focal deficit. 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)   Seizure (HCC)   On mechanically assisted ventilation (HCC)   Pressure injury of skin   Urinary retention   Allergies  Allergen Reactions   Bee Venom Rash   Morphine And Codeine Swelling    Scheduled Meds:  Chlorhexidine  Gluconate Cloth  6 each Topical Daily    dexamethasone  (DECADRON ) injection  4 mg Intravenous Q12H   Followed by   NOREEN ON 10/27/2024] dexamethasone  (DECADRON ) injection  2 mg Intravenous Q12H   Followed by   NOREEN ON 10/29/2024] dexamethasone  (DECADRON ) injection  2 mg Intravenous Q24H   enoxaparin  (LOVENOX ) injection  40 mg Subcutaneous Q24H   insulin  aspart  0-15 Units Subcutaneous TID WC   [START ON 10/26/2024] insulin  glargine-yfgn  8 Units Subcutaneous Daily   levETIRAcetam  1,000 mg Oral BID   mirtazapine   7.5 mg Oral QHS   pantoprazole   40 mg Oral QHS   senna  1 tablet Oral Daily   sodium chloride  flush  10-40 mL Intracatheter Q12H   Continuous Infusions:  sodium chloride  50 mL/hr at 10/25/24 1355   acyclovir 800 mg (10/25/24 1357)   PRN Meds:.acetaminophen  **OR** [DISCONTINUED] acetaminophen , ondansetron  **OR** ondansetron  (ZOFRAN ) IV, mouth rinse, polyethylene glycol, sodium chloride  flush   SUBJECTIVE: Transferred to floor No event overnight No complaint Afebrile    Review of Systems: ROS All other ROS was negative, except mentioned above     OBJECTIVE: Vitals:   10/25/24 0251 10/25/24 0345 10/25/24 0826 10/25/24 1200  BP: (!) 158/72 119/76 (!) 184/66 126/74  Pulse: 66 66 64 (!) 57  Resp: 20 20 (!) 22 (!) 23  Temp: 98.4 F (36.9 C) 98.2 F (36.8 C) 97.7 F (36.5 C) 97.8 F (36.6 C)  TempSrc: Oral Oral Oral Axillary  SpO2: 99% 97% 96% 96%  Weight:      Height:       Body mass index is 29.5 kg/m.  Physical Exam General/constitutional: alert; weak phonation  but verbal/conversed HEENT: Normocephalic, PER, Conj Clear, EOMI CV: rrr no mrg Lungs: clear to auscultation, normal respiratory effort Abd: Soft, Nontender Ext: no edema Skin: No Rash Neuro: alert, interactive, follows complex commands, oriented x3  Central line presence: left upper ext picc site no bleeding/erythema/purulence   Lab Results Lab Results  Component Value Date   WBC 6.9 10/22/2024   HGB 12.9 (L) 10/22/2024    HCT 35.5 (L) 10/22/2024   MCV 90.8 10/22/2024   PLT 192 10/22/2024    Lab Results  Component Value Date   CREATININE 0.68 10/23/2024   BUN 17 10/23/2024   NA 136 10/23/2024   K 4.1 10/23/2024   CL 101 10/23/2024   CO2 23 10/23/2024    Lab Results  Component Value Date   ALT 37 10/17/2024   AST 38 10/17/2024   ALKPHOS 43 10/17/2024   BILITOT 1.2 10/17/2024      Microbiology: Recent Results (from the past 240 hours)  Resp panel by RT-PCR (RSV, Flu A&B, Covid) Anterior Nasal Swab     Status: None   Collection Time: 10/17/24 12:49 PM   Specimen: Anterior Nasal Swab  Result Value Ref Range Status   SARS Coronavirus 2 by RT PCR NEGATIVE NEGATIVE Final    Comment: (NOTE) SARS-CoV-2 target nucleic acids are NOT DETECTED.  The SARS-CoV-2 RNA is generally detectable in upper respiratory specimens during the acute phase of infection. The lowest concentration of SARS-CoV-2 viral copies this assay can detect is 138 copies/mL. A negative result does not preclude SARS-Cov-2 infection and should not be used as the sole basis for treatment or other patient management decisions. A negative result may occur with  improper specimen collection/handling, submission of specimen other than nasopharyngeal swab, presence of viral mutation(s) within the areas targeted by this assay, and inadequate number of viral copies(<138 copies/mL). A negative result must be combined with clinical observations, patient history, and epidemiological information. The expected result is Negative.  Fact Sheet for Patients:  bloggercourse.com  Fact Sheet for Healthcare Providers:  seriousbroker.it  This test is no t yet approved or cleared by the [place]  FDA and  has been authorized for detection and/or diagnosis of SARS-CoV-2 by FDA under an Emergency Use Authorization (EUA). This EUA will remain  in effect (meaning this test can be used) for the  duration of the COVID-19 declaration under Section 564(b)(1) of the Act, 21 U.S.C.section 360bbb-3(b)(1), unless the authorization is terminated  or revoked sooner.       Influenza A by PCR NEGATIVE NEGATIVE Final   Influenza B by PCR NEGATIVE NEGATIVE Final    Comment: (NOTE) The Xpert Xpress SARS-CoV-2/FLU/RSV plus assay is intended as an aid in the diagnosis of influenza from Nasopharyngeal swab specimens and should not be used as a sole basis for treatment. Nasal washings and aspirates are unacceptable for Xpert Xpress SARS-CoV-2/FLU/RSV testing.  Fact Sheet for Patients: bloggercourse.com  Fact Sheet for Healthcare Providers: seriousbroker.it  This test is not yet approved or cleared by the [place]  FDA and has been authorized for detection and/or diagnosis of SARS-CoV-2 by FDA under an Emergency Use Authorization (EUA). This EUA will remain in effect (meaning this test can be used) for the duration of the COVID-19 declaration under Section 564(b)(1) of the Act, 21 U.S.C. section 360bbb-3(b)(1), unless the authorization is terminated or revoked.     Resp Syncytial Virus by PCR NEGATIVE NEGATIVE Final    Comment: (NOTE) Fact Sheet for Patients: bloggercourse.com  Fact Sheet for Healthcare  Providers: seriousbroker.it  This test is not yet approved or cleared by the [place]  FDA and has been authorized for detection and/or diagnosis of SARS-CoV-2 by FDA under an Emergency Use Authorization (EUA). This EUA will remain in effect (meaning this test can be used) for the duration of the COVID-19 declaration under Section 564(b)(1) of the Act, 21 U.S.C. section 360bbb-3(b)(1), unless the authorization is terminated or revoked.  Performed at [facility], [address] [place], [place] [address]   Blood Culture (routine x 2)     Status: None   Collection Time:  10/17/24  1:20 PM   Specimen: BLOOD  Result Value Ref Range Status   Specimen Description BLOOD BLOOD RIGHT HAND  Final   Special Requests   Final    BOTTLES DRAWN AEROBIC AND ANAEROBIC Blood Culture results may not be optimal due to an inadequate volume of blood received in culture bottles   Culture   Final    NO GROWTH 5 DAYS Performed at [facility], [address] [place], [place] [address]    Report Status 10/22/2024 FINAL  Final  Blood Culture (routine x 2)     Status: None   Collection Time: 10/17/24  1:29 PM   Specimen: BLOOD  Result Value Ref Range Status   Specimen Description BLOOD BLOOD RIGHT ARM  Final   Special Requests   Final    BOTTLES DRAWN AEROBIC AND ANAEROBIC Blood Culture adequate volume   Culture   Final    NO GROWTH 5 DAYS Performed at [facility], [address] [place], [place] [address]    Report Status 10/22/2024 FINAL  Final  Urine Culture     Status: None   Collection Time: 10/17/24  5:13 PM   Specimen: Urine, Catheterized  Result Value Ref Range Status   Specimen Description   Final    URINE, CATHETERIZED Performed at [facility], [address] [place], [place] [address]    Special Requests   Final    NONE Performed at [facility], [address], [place], [place] [address]    Culture   Final    NO GROWTH Performed at [facility], [address] [place], [place] [address]    Report Status 10/19/2024 FINAL  Final  CSF culture w Gram Stain     Status: None   Collection Time: 10/18/24  1:36 PM   Specimen: PATH Cytology CSF; Cerebrospinal Fluid  Result Value Ref Range Status   Specimen Description CSF  Final   Special Requests NONE  Final   Gram Stain NO WBC SEEN NO ORGANISMS SEEN   Final   Culture   Final    NO GROWTH 3 DAYS Performed at [facility], [address], [place], [place] [address]    Report Status 10/21/2024 FINAL  Final  MRSA Next Gen by PCR, Nasal     Status: None   Collection Time: 10/19/24  1:13 PM    Specimen: Nasal Mucosa; Nasal Swab  Result Value Ref Range Status   MRSA by PCR Next Gen NOT DETECTED NOT DETECTED Final    Comment: (NOTE) The GeneXpert MRSA Assay (FDA approved for NASAL specimens only), is one component of a comprehensive MRSA colonization surveillance program. It is not intended to diagnose MRSA infection nor to guide or monitor treatment for MRSA infections. Test performance is not FDA approved in patients less than 83 years old. Performed at [facility], [address] [place],   [address]   Culture, Respiratory w Gram Stain     Status: None   Collection Time: 10/22/24 10:03 AM   Specimen: Tracheal Aspirate; Respiratory  Result Value Ref Range Status   Specimen Description TRACHEAL ASPIRATE  Final   Special Requests NONE  Final   Gram Stain   Final    RARE WBC PRESENT, PREDOMINANTLY PMN FEW GRAM POSITIVE COCCI RARE GRAM POSITIVE RODS RARE GRAM NEGATIVE RODS    Culture   Final    FEW Normal respiratory flora-no Staph aureus or Pseudomonas seen Performed at [facility], [address], [place], [place] [address]    Report Status 10/24/2024 FINAL  Final  MRSA Next Gen by PCR, Nasal     Status: None   Collection Time: 10/22/24 10:04 AM   Specimen: Nasal Mucosa; Nasal Swab  Result Value Ref Range Status   MRSA by PCR Next Gen NOT DETECTED NOT DETECTED Final    Comment: (NOTE) The GeneXpert MRSA Assay (FDA approved for NASAL specimens only), is one component of a comprehensive MRSA colonization surveillance program. It is not intended to diagnose MRSA infection nor to guide or monitor treatment for MRSA infections. Test performance is not FDA approved in patients less than 80 years old. Performed at [facility], [address], [place], [place] [address]      Serology:   Imaging: If present, new imagings (plain films, ct scans, and mri) have been personally visualized and interpreted; radiology reports have been  reviewed. Decision making incorporated into the Impression / Recommendations.  11/17 cxr No official report yet. No obvious pulm opacity from my read   11/13 mri brain 1. No acute intracranial abnormality. 2. Increased T2 signal within the left mesial temporal lobe, without abnormal enhancement. Clinical correlation and follow up to ensure resolution is recommended.   Constance ONEIDA Passer, MD [facility] [phone] pager    10/25/2024, 4:23 PM

## 2024-10-25 NOTE — Progress Notes (Signed)
 [name]  FMW:[id number] DOB: [birth year] DOA: 10/17/2024 PCP: Dettinger, Fonda LABOR, MD    Brief Narrative:  63 year old with a history of osteoarthritis, basal cell carcinoma of the left arm, GERD, and prior SBO who presented to the [facility] ED 11/12 with 3 weeks of gradually progressive generalized weakness and a fall on 11/12.  In the ER the patient displayed significant somnolence with declining mental status.  MRI of the head revealed abnormal diffusion with FLAIR signal abnormality of the insula and left cortex inferior left frontal lobe and mesial left temporal lobe suggestive of acute encephalitis.  LP revealed CSF WBC of 60 with protein of 92 and glucose of 55 with meningitis panel positive for HSV 1.  Significant Events: 11/12 admit for AMS, generalized weakness, fall - LP indicating HSV1 encephalitis 11/14 worsening altered mental status with seizure activity, started on AEDs, then transferred to ICU for intubation as pt could not protect airway 11/18 extubated and placed on 3L Drain, saturating well 11/20 TRH assumed care   Goals of Care:   Code Status: Full Code   DVT prophylaxis: enoxaparin  (LOVENOX ) injection 40 mg Start: 10/20/24 1545 SCDs Start: 10/17/24 2340   Interim Hx: Afebrile.  Blood pressure labile with systolic trend of 119-184.  Vitals otherwise stable.  Oxygen  saturation 96% room air.  Remains encephalopathic without significant improvement in mental status over last 24 hours per dialysis at bedside.  No distress at time of my evaluation.  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 ID recommends 3 weeks of acyclovir  therapy - decadron  as directed by Neurology Honor.harter 1-2 (11/18-11/19): 4mg  IV q 8 hrs > Days 3-4: 4mg  IV q 12 hrs > Days 5-6: 2mg  IV q 12 hrs > Days 7-8: 2mg  IV daily > discontinue]  Seizures Due to above -continue Keppra  as per neurology  Septic shock due to encephalitis Resolved  Acute hypoxic  respiratory failure due to seizures and encephalopathy Required intubation and mechanical ventilation 11/14-11/18 -now resolved  Steroid-induced hyperglycemia Monitor CBG trend  GERD Continue PPI   Family Communication: Spoke with spouse at bedside Disposition: Not yet medically stable for discharge Acute Inpatient Rehab (3hours/Day)10/24/2024 1303  Objective: Blood pressure 126/74, pulse (!) 57, temperature 97.8 F (36.6 C), temperature source Axillary, resp. rate (!) 23, height 5' 5 (1.651 m), weight 80.4 kg, SpO2 96%.  Intake/Output Summary (Last 24 hours) at 10/25/2024 1545 Last data filed at 10/25/2024 0700 Gross per 24 hour  Intake 436.17 ml  Output 2850 ml  Net -2413.83 ml   Filed Weights   10/22/24 0341 10/23/24 0419 10/24/24 0500  Weight: 80.6 kg 81.2 kg 80.4 kg    Examination: General: No acute respiratory distress Lungs: Clear to auscultation bilaterally without wheezes or crackles Cardiovascular: Regular rate and rhythm without murmur gallop or rub normal S1 and S2 Abdomen: Nontender, nondistended, soft, bowel sounds positive, no rebound, no ascites, no appreciable mass Extremities: No significant cyanosis, clubbing, or edema bilateral lower extremities  CBC: Recent Labs  Lab 10/20/24 0500 10/21/24 0320 10/22/24 0540  WBC 6.5 6.1 6.9  HGB 12.7* 14.1 12.9*  HCT 35.3* 38.8* 35.5*  MCV 90.7 90.2 90.8  PLT 155 167 192   Basic Metabolic Panel: Recent Labs  Lab 10/21/24 0320 10/22/24 0540 10/23/24 0637 10/24/24 0655  NA 130* 132* 136  --   K 3.8 4.6 4.1  --   CL 96* 98 101  --   CO2 24 25 23   --  GLUCOSE 123* 146* 209*  --   BUN 10 13 17   --   CREATININE 0.86 0.71 0.68  --   CALCIUM  7.8* 8.3* 8.8*  --   MG  --  2.2  --   --   PHOS  --  2.4* 2.8 3.1   GFR: Estimated Creatinine Clearance: 94.8 mL/min (by C-G formula based on SCr of 0.68 mg/dL).   Scheduled Meds:  Chlorhexidine  Gluconate Cloth  6 each Topical Daily   dexamethasone   (DECADRON ) injection  4 mg Intravenous Q12H   Followed by   [START ON 10/27/2024] dexamethasone  (DECADRON ) injection  2 mg Intravenous Q12H   Followed by   NOREEN ON 10/29/2024] dexamethasone  (DECADRON ) injection  2 mg Intravenous Q24H   enoxaparin  (LOVENOX ) injection  40 mg Subcutaneous Q24H   insulin  aspart  0-15 Units Subcutaneous TID WC   [START ON 10/26/2024] insulin  glargine-yfgn  8 Units Subcutaneous Daily   levETIRAcetam  1,000 mg Oral BID   mirtazapine   7.5 mg Oral QHS   pantoprazole   40 mg Oral QHS   senna  1 tablet Oral Daily   sodium chloride  flush  10-40 mL Intracatheter Q12H   Continuous Infusions:  sodium chloride  50 mL/hr at 10/25/24 1355   acyclovir 800 mg (10/25/24 1357)     LOS: 8 days   Reyes IVAR Moores, MD [facility] Office  [phone] Pager - Text Page per Tracey  If 7PM-7AM, please contact night-coverage per Amion 10/25/2024, 3:45 PM

## 2024-10-26 ENCOUNTER — Inpatient Hospital Stay (HOSPITAL_COMMUNITY)

## 2024-10-26 DIAGNOSIS — G936 Cerebral edema: Secondary | ICD-10-CM | POA: Diagnosis not present

## 2024-10-26 DIAGNOSIS — B1009 Other human herpesvirus encephalitis: Secondary | ICD-10-CM | POA: Diagnosis not present

## 2024-10-26 LAB — COMPREHENSIVE METABOLIC PANEL WITH GFR
ALT: 37 U/L (ref 0–44)
AST: 30 U/L (ref 15–41)
Albumin: 2.9 g/dL — ABNORMAL LOW (ref 3.5–5.0)
Alkaline Phosphatase: 36 U/L — ABNORMAL LOW (ref 38–126)
Anion gap: 9 (ref 5–15)
BUN: 22 mg/dL (ref 8–23)
CO2: 25 mmol/L (ref 22–32)
Calcium: 8.3 mg/dL — ABNORMAL LOW (ref 8.9–10.3)
Chloride: 103 mmol/L (ref 98–111)
Creatinine, Ser: 0.77 mg/dL (ref 0.61–1.24)
GFR, Estimated: >60 mL/min (ref >60)
Glucose, Bld: 92 mg/dL (ref 70–99)
Potassium: 3.8 mmol/L (ref 3.5–5.1)
Sodium: 137 mmol/L (ref 135–145)
Total Bilirubin: 0.8 mg/dL (ref 0.0–1.2)
Total Protein: 5.9 g/dL — ABNORMAL LOW (ref 6.5–8.1)

## 2024-10-26 LAB — CBC
HCT: 38.5 % — ABNORMAL LOW (ref 39.0–52.0)
Hemoglobin: 13.1 g/dL (ref 13.0–17.0)
MCH: 32.3 pg (ref 26.0–34.0)
MCHC: 34 g/dL (ref 30.0–36.0)
MCV: 95.1 fL (ref 80.0–100.0)
Platelets: 264 10*3/uL (ref 150–400)
RBC: 4.05 MIL/uL — ABNORMAL LOW (ref 4.22–5.81)
RDW: 13.7 % (ref 11.5–15.5)
WBC: 6.7 10*3/uL (ref 4.0–10.5)
nRBC: 0.6 % — ABNORMAL HIGH (ref 0.0–0.2)

## 2024-10-26 LAB — GLUCOSE, CAPILLARY
Glucose-Capillary: 99 mg/dL (ref 70–99)
Glucose-Capillary: 91 mg/dL (ref 70–99)
Glucose-Capillary: 144 mg/dL — ABNORMAL HIGH (ref 70–99)
Glucose-Capillary: 94 mg/dL (ref 70–99)

## 2024-10-26 LAB — MAGNESIUM: Magnesium: 2.3 mg/dL (ref 1.7–2.4)

## 2024-10-26 MED ORDER — JUVEN PO PACK
1.0000 | PACK | Freq: Two times a day (BID) | ORAL | Status: DC
  Administered 2024-10-26 – 2024-10-30 (×9): 1 via ORAL
  Filled 2024-10-26 (×9): qty 1

## 2024-10-26 MED ORDER — ADULT MULTIVITAMIN W/MINERALS CH
1.0000 | ORAL_TABLET | Freq: Every day | ORAL | Status: DC
  Administered 2024-10-26 – 2024-10-30 (×5): 1 via ORAL
  Filled 2024-10-26 (×5): qty 1

## 2024-10-26 MED ORDER — ENSURE PLUS HIGH PROTEIN PO LIQD
237.0000 mL | Freq: Two times a day (BID) | ORAL | Status: DC
  Administered 2024-10-27 (×2): 237 mL via ORAL

## 2024-10-26 NOTE — Progress Notes (Signed)
   Inpatient Rehabilitation Admissions Coordinator   I will place rehab consult for full assessment of candidacy for possible CIR admit.  Heron Leavell, RN, MSN Rehab Admissions Coordinator [phone] 10/26/2024 3:58 PM

## 2024-10-26 NOTE — Progress Notes (Signed)
 Occupational Therapy Treatment Patient Details Name: [name] MRN: [id number] DOB: 1961/07/18 Today's Date: 10/26/2024   History of present illness 63 yo M adm 10/17/24 with weakness, fall, AMS, acute encephalopathy and sepsis. MRI with HSV encephalitis. 11/13 lumbar puncture. 11/14-11/18 VDRF. 11/14 seizure. PMhx: HLD, anxiety, hernia   OT comments  Pt up in chair upon arrival, appearing sleepy, easily aroused. No interest in eating. Agreed to participate in grooming seated in chair. Requires up to moderate assistance with decreased task persistence and sequencing noted. Demonstrates fair sitting balance unsupported at edge of chair. Pt able to stand  from chair with multimodal cues for technique and min assist with RW. Pt with tendency to gesture rather than attempt to use his low volume voice. Progressing steadily. Patient will benefit from intensive inpatient follow-up therapy, >3 hours/day.      If plan is discharge home, recommend the following:  Assistance with cooking/housework;Assistance with feeding;Direct supervision/assist for medications management;Direct supervision/assist for financial management;Assist for transportation;Help with stairs or ramp for entrance;A lot of help with bathing/dressing/bathroom;A little help with walking and/or transfers   Equipment Recommendations  Other (comment) (defer to next venue)    Recommendations for Other Services      Precautions / Restrictions Precautions Precautions: Fall Recall of Precautions/Restrictions: Impaired Restrictions Weight Bearing Restrictions Per Provider Order: No       Mobility Bed Mobility               General bed mobility comments: up in chair    Transfers Overall transfer level: Needs assistance Equipment used: Rolling walker (2 wheels) Transfers: Sit to/from Stand Sit to Stand: Min assist           General transfer comment: minimal physical assistance to rise, multimodal assist for  technique with RW     Balance Overall balance assessment: Needs assistance Sitting-balance support: No upper extremity supported Sitting balance-Leahy Scale: Fair Sitting balance - Comments: at edge of chair     Standing balance-Leahy Scale: Poor Standing balance comment: B UE support and min assist                           ADL either performed or assessed with clinical judgement   ADL Overall ADL's : Needs assistance/impaired     Grooming: Wash/dry hands;Wash/dry face;Brushing hair;Oral care;Sitting;Moderate assistance (shampoo cap) Grooming Details (indicate cue type and reason): decreased thoroughness, cues for sequencing                                    Extremity/Trunk Assessment              Vision       Perception     Praxis     Communication Communication Communication: Impaired Factors Affecting Communication: Reduced clarity of speech;Other (comment) (low volume, frequently using gestures instead of attempting to vocalize)   Cognition Arousal: Lethargic Behavior During Therapy: Flat affect Cognition: Cognition impaired   Orientation impairments: Time, Situation Awareness: Intellectual awareness impaired, Online awareness impaired Memory impairment (select all impairments): Short-term memory, Working civil service fast streamer, Engineer, structural memory Attention impairment (select first level of impairment): Sustained attention Executive functioning impairment (select all impairments): Initiation, Organization, Sequencing, Reasoning, Problem solving OT - Cognition Comments: Pt lacks interest in eating, agreed to participate in grooming with encouragement.                 Following  commands: Impaired Following commands impaired: Follows one step commands inconsistently      Cueing   Cueing Techniques: Verbal cues, Tactile cues  Exercises      Shoulder Instructions       General Comments      Pertinent Vitals/ Pain        Pain Assessment Pain Assessment: Faces Faces Pain Scale: No hurt  Home Living                                          Prior Functioning/Environment              Frequency  Min 2X/week        Progress Toward Goals  OT Goals(current goals can now be found in the care plan section)  Progress towards OT goals: Progressing toward goals  Acute Rehab OT Goals OT Goal Formulation: With family Time For Goal Achievement: 11/07/24 Potential to Achieve Goals: Good ADL Goals Pt Will Perform Eating: with modified independence;sitting Pt Will Perform Grooming: with set-up;with supervision;sitting Pt Will Perform Upper Body Bathing: with contact guard assist;sitting Pt Will Perform Lower Body Dressing: with mod assist;sitting/lateral leans;sit to/from stand Pt Will Transfer to Toilet: with mod assist;ambulating;bedside commode Pt Will Perform Toileting - Clothing Manipulation and hygiene: with mod assist;sitting/lateral leans;sit to/from stand Additional ADL Goal #1: Patient will demonstrate ability to appropriately sequence a familiar 3-step functional or therapuetic task with Mod I in 4/5 opportunities.  Plan      Co-evaluation                 AM-PAC OT 6 Clicks Daily Activity     Outcome Measure   Help from another person eating meals?: A Little Help from another person taking care of personal grooming?: A Lot Help from another person toileting, which includes using toliet, bedpan, or urinal?: Total Help from another person bathing (including washing, rinsing, drying)?: A Lot Help from another person to put on and taking off regular upper body clothing?: A Lot Help from another person to put on and taking off regular lower body clothing?: Total 6 Click Score: 11    End of Session Equipment Utilized During Treatment: Gait belt;Rolling walker (2 wheels)  OT Visit Diagnosis: Unsteadiness on feet (R26.81);Other abnormalities of gait and mobility  (R26.89);Muscle weakness (generalized) (M62.81);Other symptoms and signs involving cognitive function   Activity Tolerance Patient limited by lethargy   Patient Left in chair;with call bell/phone within reach;with chair alarm set   Nurse Communication          Time: [phone] OT Time Calculation (min): 20 min  Charges: OT General Charges $OT Visit: 1 Visit OT Treatments $Self Care/Home Management : 8-22 mins [name] HERO, OTR/L Acute Rehabilitation Services Office: [phone]   Kennth [name] Helling 10/26/2024, 1:19 PM

## 2024-10-26 NOTE — Progress Notes (Signed)
 [name]  FMW:[id number] DOB: Dec 31, 1960 DOA: 10/17/2024 PCP: Dettinger, Fonda LABOR, MD    Brief Narrative:  63 year old with a history of osteoarthritis, basal cell carcinoma of the left arm, GERD, and prior SBO who presented to the [facility] ED 11/12 with 3 weeks of gradually progressive generalized weakness and a fall on 11/12.  In the ER the patient displayed significant somnolence with declining mental status.  MRI of the head revealed abnormal diffusion with FLAIR signal abnormality of the insula and left cortex inferior left frontal lobe and mesial left temporal lobe suggestive of acute encephalitis.  LP revealed CSF WBC of 60 with protein of 92 and glucose of 55 with meningitis panel positive for HSV 1.  Significant Events: 11/12 admit for AMS, generalized weakness, fall - LP indicating HSV1 encephalitis 11/14 worsening altered mental status with seizure activity, started on AEDs, then transferred to ICU for intubation as pt could not protect airway 11/18 extubated and placed on 3L [place], saturating well 11/20 TRH assumed care   Goals of Care:   Code Status: Full Code   DVT prophylaxis: enoxaparin  (LOVENOX ) injection 40 mg Start: 10/20/24 1545 SCDs Start: 10/17/24 2340   Interim Hx: No acute events recorded overnight.  Afebrile.  Vital signs stable. More alert and conversant today.  Denies new complaints.  No chest pain or shortness of breath.  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 ID recommends 3 weeks of acyclovir  therapy (through 11/07/2024) Szo- decadron  as directed by Neurology [Days 1-2 (11/18-11/19): 4mg  IV q 8 hrs > Days 3-4: 4mg  IV q 12 hrs > Days 5-6: 2mg  IV q 12 hrs > Days 7-8: 2mg  IV daily > discontinue] -appears to be improving at a more rapid pace at this time -downgrade from progressive care and continue to work on mobility  Seizures Due to above - continue Keppra  as per Neurology  Septic shock due to  encephalitis Resolved  Acute hypoxic respiratory failure due to seizures and encephalopathy Required intubation and mechanical ventilation 11/14-11/18 -now resolved  Steroid-induced hyperglycemia CBG very well-controlled at present  GERD Continue PPI   Family Communication: No family present at time of exam today Disposition: Should be medically stable for disposition in approximately 24 hours Acute Inpatient Rehab (3hours/Day)10/24/2024 1303  Objective: Blood pressure 104/67, pulse 60, temperature 98.1 F (36.7 C), temperature source Oral, resp. rate (!) 23, height 5' 5 (1.651 m), weight 80.4 kg, SpO2 98%.  Intake/Output Summary (Last 24 hours) at 10/26/2024 1019 Last data filed at 10/26/2024 0800 Gross per 24 hour  Intake 2215.65 ml  Output 2925 ml  Net -709.35 ml   Filed Weights   10/22/24 0341 10/23/24 0419 10/24/24 0500  Weight: 80.6 kg 81.2 kg 80.4 kg    Examination: General: No acute respiratory distress Lungs: Clear to auscultation bilaterally without wheezes or crackles Cardiovascular: Regular rate and rhythm without murmur gallop or rub normal S1 and S2 Abdomen: Nontender, nondistended, soft, bowel sounds positive, no rebound, no ascites, no appreciable mass Extremities: No significant cyanosis, clubbing, or edema bilateral lower extremities  CBC: Recent Labs  Lab 10/21/24 0320 10/22/24 0540 10/26/24 0520  WBC 6.1 6.9 6.7  HGB 14.1 12.9* 13.1  HCT 38.8* 35.5* 38.5*  MCV 90.2 90.8 95.1  PLT 167 192 264   Basic Metabolic Panel: Recent Labs  Lab 10/22/24 0540 10/23/24 0637 10/24/24 0655 10/26/24 0520  NA 132* 136  --  137  K 4.6 4.1  --  3.8  CL 98 101  --  103  CO2 25 23  --  25  GLUCOSE 146* 209*  --  92  BUN 13 17  --  22  CREATININE 0.71 0.68  --  0.77  CALCIUM  8.3* 8.8*  --  8.3*  MG 2.2  --   --  2.3  PHOS 2.4* 2.8 3.1  --    GFR: Estimated Creatinine Clearance: 94.8 mL/min (by C-G formula based on SCr of 0.77 mg/dL).   Scheduled  Meds:  Chlorhexidine  Gluconate Cloth  6 each Topical Daily   dexamethasone  (DECADRON ) injection  4 mg Intravenous Q12H   Followed by   NOREEN ON 10/27/2024] dexamethasone  (DECADRON ) injection  2 mg Intravenous Q12H   Followed by   NOREEN ON 10/29/2024] dexamethasone  (DECADRON ) injection  2 mg Intravenous Q24H   enoxaparin  (LOVENOX ) injection  40 mg Subcutaneous Q24H   insulin  aspart  0-15 Units Subcutaneous TID WC   insulin  glargine-yfgn  8 Units Subcutaneous Daily   levETIRAcetam   1,000 mg Oral BID   mirtazapine   7.5 mg Oral QHS   pantoprazole   40 mg Oral QHS   senna  1 tablet Oral Daily   sodium chloride  flush  10-40 mL Intracatheter Q12H   Continuous Infusions:  sodium chloride  50 mL/hr at 10/26/24 0800   acyclovir  Stopped (10/26/24 9357)     LOS: 9 days   Reyes IVAR Moores, MD [facility] Office  [phone] Pager - Text Page per Tracey  If 7PM-7AM, please contact night-coverage per Amion 10/26/2024, 10:19 AM

## 2024-10-26 NOTE — Progress Notes (Signed)
 Physical Therapy Treatment Patient Details Name: [name] MRN: [id number] DOB: 1961/05/19 Today's Date: 10/26/2024   History of Present Illness 63 yo M adm 10/17/24 with weakness, fall, AMS, acute encephalopathy and sepsis. MRI with HSV encephalitis. 11/13 lumbar puncture. 11/14-11/18 VDRF. 11/14 seizure. PMhx: HLD, anxiety, hernia    PT Comments  PT initially with eyes closed, aroused to voice. Pt soft spoken throughout session with cues to respond to questions and commands. Pt remains with impaired initiation, cognition, strength and function but able to progress to walking with assist this date. Pt requires sequential cues and 24hr assist. Patient will benefit from intensive inpatient follow-up therapy, >3 hours/day. Encouraged OOB during day with staff. Will continue to follow.      If plan is discharge home, recommend the following: A lot of help with bathing/dressing/bathroom;Assistance with feeding;Assistance with cooking/housework;Direct supervision/assist for financial management;Supervision due to cognitive status;Assist for transportation;Help with stairs or ramp for entrance;A little help with walking and/or transfers   Can travel by private vehicle        Equipment Recommendations  Rolling walker (2 wheels)    Recommendations for Other Services       Precautions / Restrictions Precautions Precautions: Fall Recall of Precautions/Restrictions: Impaired     Mobility  Bed Mobility Overal bed mobility: Needs Assistance Bed Mobility: Supine to Sit     Supine to sit: HOB elevated, Used rails, Contact guard     General bed mobility comments: multimodal cues to initiate and complete transition to sitting with pt using rail and HOB 30 degrees to pivot to [name] with increased time    Transfers Overall transfer level: Needs assistance   Transfers: Sit to/from Stand Sit to Stand: Min assist           General transfer comment: min assist to rise from bed and  chair, cues for sequence with hand over hand cues to reach for chair to sit    Ambulation/Gait Ambulation/Gait assistance: Min assist Gait Distance (Feet): 128 Feet Assistive device: Rolling walker (2 wheels) Gait Pattern/deviations: Step-through pattern, Decreased stride length, Narrow base of support   Gait velocity interpretation: <1.8 ft/sec, indicate of risk for recurrent falls   General Gait Details: pt with cues for posture, proximity to RW, direction, pt not recognizing fatigue with cues to return to room, veering left at times and assist to steer away from obstacles. Pt urinating during gait (with primofit tubing attached) and unaware   Stairs             Wheelchair Mobility     Tilt Bed    Modified Rankin (Stroke Patients Only)       Balance Overall balance assessment: Needs assistance Sitting-balance support: Bilateral upper extremity supported, No upper extremity supported, Feet supported Sitting balance-Leahy Scale: Fair Sitting balance - Comments: CGA for sitting [name]   Standing balance support: Bilateral upper extremity supported, During functional activity, Reliant on assistive device for balance Standing balance-Leahy Scale: Poor Standing balance comment: RW for gait                            Communication Communication Communication: No apparent difficulties  Cognition Arousal: Alert Behavior During Therapy: Flat affect   PT - Cognitive impairments: Problem solving, Orientation, Awareness, Memory, Safety/Judgement, Initiation   Orientation impairments: Time, Situation                   PT - Cognition Comments: pt aware of  self and that he is in the hospital. States year as 73 and that he is 63 years old. Unable to recall correct year despite education several times during session Following commands: Impaired Following commands impaired: Follows one step commands with increased time, Follows one step commands  inconsistently    Cueing Cueing Techniques: Verbal cues, Gestural cues, Tactile cues  Exercises General Exercises - Lower Extremity Long Arc Quad: AROM, Both, 10 reps, Seated, Strengthening Hip Flexion/Marching: AROM, Strengthening, Seated, Both, 10 reps    General Comments        Pertinent Vitals/Pain Pain Assessment Pain Assessment: CPOT Facial Expression: Relaxed, neutral Body Movements: Absence of movements Muscle Tension: Relaxed Compliance with ventilator (intubated pts.): N/A Vocalization (extubated pts.): Talking in normal tone or no sound CPOT Total: 0 Pain Intervention(s): Limited activity within patient's tolerance, Monitored during session, Repositioned    Home Living                          Prior Function            PT Goals (current goals can now be found in the care plan section) Progress towards PT goals: Progressing toward goals    Frequency    Min 2X/week      PT Plan      Co-evaluation              AM-PAC PT 6 Clicks Mobility   Outcome Measure  Help needed turning from your back to your side while in a flat bed without using bedrails?: A Little Help needed moving from lying on your back to sitting on the side of a flat bed without using bedrails?: A Little Help needed moving to and from a bed to a chair (including a wheelchair)?: A Little Help needed standing up from a chair using your arms (e.g., wheelchair or bedside chair)?: A Little Help needed to walk in hospital room?: A Lot Help needed climbing 3-5 steps with a railing? : Total 6 Click Score: 15    End of Session Equipment Utilized During Treatment: Gait belt Activity Tolerance: Patient tolerated treatment well Patient left: with call bell/phone within reach;in chair;with chair alarm set;with nursing/sitter in room Nurse Communication: Mobility status PT Visit Diagnosis: Other abnormalities of gait and mobility (R26.89);Muscle weakness (generalized)  (M62.81);Other symptoms and signs involving the nervous system (R29.898);Difficulty in walking, not elsewhere classified (R26.2)     Time: 8966-8897 PT Time Calculation (min) (ACUTE ONLY): 29 min  Charges:    $Gait Training: 8-22 mins $Therapeutic Activity: 8-22 mins PT General Charges $$ ACUTE PT VISIT: 1 Visit                     Lenoard SQUIBB, PT Acute Rehabilitation Services Office: [phone]    Kathey Simer B Arbie Reisz 10/26/2024, 12:00 PM

## 2024-10-26 NOTE — Consult Note (Signed)
 Modified Barium Swallow Study  Patient Details  Name: [name] MRN: [id number] Date of Birth: 06-04-1961  Today's Date: 10/26/2024  Modified Barium Swallow completed.  Full report located under Chart Review in the Imaging Section.  History of Present Illness Mr. Wilbon is a 63 y/o gentleman admitted after a fall on 11/12. 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 ON 11/14, he began developing new seizure activity and was intubated until 11/18;MBS generated to assess swallow function after intermittent throat clearing/cough noted on BSE on 10/25/24. Pmhx significant for GERD.   Clinical Impression Pt presents with mild pharyngoesophageal dysphagia c/b slow, prolonged chewing with solids.  Partial epiglottic inversion d/t probable presence of cervical osteophytes with vallecular/pyriform residue observed post swallow with clearance noted to mild range with compensatory strategies including liquid wash, repetitive,effortful swallows d/t diminished stripping wave/and partial distension/duration/partial obstruction of flow.  Esophageal sweep noted esophageal retention without retrograde flow observed with puree/thin liquids/pill presentation.  Pt's mentation decreased as he was noted to chew the barium tablet and required min verbal cues to re-swallow during study.  Pt did cough during study, but airway intrustion was not observed despite volume/consistency provided.  Recommend Regular/thin liquids with swallowing precautions in place/esophageal precaution education to be provided to pt/family to A during po intake.  ST will f/u in acute setting for diet tolerance/education regarding dysphagia. Factors that may increase risk of adverse event in presence of aspiration Noe [employer] Lianne 2021): Poor general health and/or compromised immunity;Respiratory or GI disease;Reduced cognitive function;Frail or deconditioned;Aspiration of thick, dense, and/or acidic materials;Frequent aspiration of large  volumes  Swallow Evaluation Recommendations Recommendations: PO diet PO Diet Recommendation: Regular;Thin liquids (Level 0) Liquid Administration via: Cup;Straw Medication Administration: Whole meds with puree (or with liquids as mentation improves) Supervision: Full supervision/cueing for swallowing strategies;Staff to assist with self-feeding Swallowing strategies  : Slow rate;Small bites/sips;Minimize environmental distractions;Multiple dry swallows after each bite/sip;Follow solids with liquids Postural changes: Position pt fully upright for meals;Stay upright 30-60 min after meals Oral care recommendations: Oral care BID (2x/day) Recommended consults: Consider esophageal assessment      Pat Shakeela Rabadan,M.S.,CCC-SLP 10/26/2024,12:00 PM

## 2024-10-26 NOTE — Progress Notes (Addendum)
 Initial Nutrition Assessment  DOCUMENTATION CODES:   Not applicable  INTERVENTION:  1 packet Juven BID to support wound healing. Each packet provides 95 calories, 2.5 grams of protein (collagen), and 9.8 grams of carbohydrate (3 grams sugar); also contains 7 grams of L-arginine and L-glutamine, 300 mg vitamin C, 15 mg vitamin E, 1.2 mcg vitamin B-12, 9.5 mg zinc , 200 mg calcium , and 1.5 g Calcium  Beta-hydroxy-Beta-methylbutyrate. Daily multivitamin PO. Ensure Plus High Protein PO BID. Each supplement provides 350 Kcals and 20 grams of protein. Continue regular diet. Continue mirtazapine  for appetite stimulation.  NUTRITION DIAGNOSIS:   Inadequate oral intake related to lethargy/confusion as evidenced by  (need for ONS). - resolved with diet advancement   GOAL:   Patient will meet greater than or equal to 90% of their needs - progressing   MONITOR:   PO intake, Supplement acceptance, Labs, Weight trends   REASON FOR ASSESSMENT:   Follow-up for: Consult Enteral/tube feeding initiation and management  ASSESSMENT:   Patient presented with weakness s/p fall, nausea/vomiting/diarrhea, persistent headache and was found to have acute encephalopathy likely encephalitis/meningitis with severe sepsis and respiratory failure. PMH significant for SBO, GERD, and arthritis.  11/12 - presented to ED, LP x 2 attempted in ED unsuccessfully. 11/13 - LP in IR, wife reports patient was eating well PTA with intentional weight loss to UBW of 175 lbs by eating healthier. 11/14 - rapid response called, intubated and transferred to ICU, Cortrak placed, Vital AF 1.2 started. 11/18 - extubated to nasal canula, TF at goal, TF changed to Osmolite 1.5. 11/19 - passed SLP swallow eval, started on regular diet/thin liquids. Cortrak removed. 11/20 - mirtazapine  started. Wife reports the patient coughing with meals, SLP plans for MBS 11/21.  Update: The patient is s/p MBS with SLP recommending continuation  of reg/thin. Visited the patient who states he had a hamburger today but did not finish it. He does not have a good appetite but denies any discomfort with PO. He is amenable to Ensure and Juven.  Scheduled Meds:  Chlorhexidine  Gluconate Cloth  6 each Topical Daily   dexamethasone  (DECADRON ) injection  4 mg Intravenous Q12H   Followed by   NOREEN ON 10/27/2024] dexamethasone  (DECADRON ) injection  2 mg Intravenous Q12H   Followed by   NOREEN ON 10/29/2024] dexamethasone  (DECADRON ) injection  2 mg Intravenous Q24H   enoxaparin  (LOVENOX ) injection  40 mg Subcutaneous Q24H   insulin  aspart  0-15 Units Subcutaneous TID WC   insulin  glargine-yfgn  8 Units Subcutaneous Daily   levETIRAcetam   1,000 mg Oral BID   mirtazapine   7.5 mg Oral QHS   pantoprazole   40 mg Oral QHS   senna  1 tablet Oral Daily   sodium chloride  flush  10-40 mL Intracatheter Q12H   Continuous Infusions:  sodium chloride  50 mL/hr at 10/26/24 0800   acyclovir  Stopped (10/26/24 9357)    Diet Order             Diet regular Room service appropriate? Yes; Fluid consistency: Thin  Diet effective now                  Meal Intake: 25% per patient  Labs:     Latest Ref Rng & Units 10/26/2024    5:20 AM 10/23/2024    6:37 AM 10/22/2024    5:40 AM  CMP  Glucose 70 - 99 mg/dL 92  790  853   BUN 8 - 23 mg/dL 22  17  13    Creatinine  0.61 - 1.24 mg/dL 9.22  9.31  9.28   Sodium 135 - 145 mmol/L 137  136  132   Potassium 3.5 - 5.1 mmol/L 3.8  4.1  4.6   Chloride 98 - 111 mmol/L 103  101  98   CO2 22 - 32 mmol/L 25  23  25    Calcium  8.9 - 10.3 mg/dL 8.3  8.8  8.3   Total Protein 6.5 - 8.1 g/dL 5.9     Total Bilirubin 0.0 - 1.2 mg/dL 0.8     Alkaline Phos 38 - 126 U/L 36     AST 15 - 41 U/L 30     ALT 0 - 44 U/L 37        I/O: +75 mL since admit  NUTRITION - FOCUSED PHYSICAL EXAM: Flowsheet Row Most Recent Value  Orbital Region No depletion  Upper Arm Region Mild depletion  Thoracic and Lumbar Region No  depletion  Buccal Region No depletion  Temple Region Mild depletion  Clavicle Bone Region No depletion  Clavicle and Acromion Bone Region No depletion  Scapular Bone Region No depletion  Dorsal Hand No depletion  Patellar Region Mild depletion  Anterior Thigh Region Mild depletion  Posterior Calf Region No depletion  Edema (RD Assessment) None  Hair Reviewed  Eyes Reviewed  Mouth Reviewed  Skin Reviewed  Nails Reviewed     EDUCATION NEEDS:   Education needs have been addressed  Skin:  Skin Assessment: Skin Integrity Issues: Skin Integrity Issues:: Stage I Stage I: mid coccyx  Last BM:  11/20 type 6  Height:   Ht Readings from Last 1 Encounters:  10/17/24 5' 5 (1.651 m)    Weight:     Weight Change: 5 Kg (6%) intentional loss in 1 month prior to admission  Usual Body Weight: 175 lbs  Edema: non-pitting generalized  Ideal Body Weight:  62 kg   BMI:  Body mass index is 29.5 kg/m.  Estimated Nutritional Needs:  Kcal:  1900-2200 Protein:  100-115 Fluid:  >1900    Leverne Ruth, MS, RDN, LDN [place]. [facility] See AMION for contact information

## 2024-10-26 NOTE — Progress Notes (Signed)
 NEUROLOGY CONSULT FOLLOW UP NOTE   Date of service: October 26, 2024 Patient Name: [name] MRN:  [id number] DOB:  [birth year]  Interval Hx/subjective   Seen and examined Using a walker to get from the chair to the bed after he had sold his gown.   Vitals   Vitals:   10/26/24 0513 10/26/24 0700 10/26/24 0800 10/26/24 1132  BP: 107/69  104/67 111/86  Pulse: 63  60 68  Resp: 20  (!) 23 (!) 21  Temp: (!) 97.3 F (36.3 C) 98.1 F (36.7 C) 98.1 F (36.7 C) 97.6 F (36.4 C)  TempSrc: Oral Oral Oral Oral  SpO2: 91% 95% 98% 97%  Weight:      Height:         Body mass index is 29.5 kg/m.  Physical Exam   General: Awake alert somewhat disheveled looking man HEENT: Normocephalic atraumatic Lungs: Clear Neurological exam He is awake alert oriented to self He said the year was 1984 He could not tell me the current president His speech is extremely hypophonic There is no evidence of dysarthria No aphasia Poor attention concentration Cranial nerves II to XII intact Motor strength appears grossly intact in all 4 extremities without focality Sensation appears intact to light touch Gait is cautious with the walker  Medications  Current Facility-Administered Medications:    0.9 %  sodium chloride  infusion, , Intravenous, Continuous, Danton Reyes DASEN, MD, Last Rate: 50 mL/hr at 10/26/24 0800, Infusion Verify at 10/26/24 0800   acetaminophen  (TYLENOL ) tablet 650 mg, 650 mg, Oral, Q6H PRN **OR** [DISCONTINUED] acetaminophen  (TYLENOL ) suppository 650 mg, 650 mg, Rectal, Q6H PRN, [name], MD   acyclovir  (ZOVIRAX ) 800 mg in dextrose  5 % 250 mL IVPB, 800 mg, Intravenous, Q8H, Vu, Trung T, MD, Stopped at 10/26/24 9357   Chlorhexidine  Gluconate Cloth 2 % PADS 6 each, 6 each, Topical, Daily, [name], [name], MD, 6 each at 10/25/24 0955   [COMPLETED] dexamethasone  (DECADRON ) injection 4 mg, 4 mg, Intravenous, Q8H, 4 mg at 10/25/24 1357 **FOLLOWED BY** dexamethasone   (DECADRON ) injection 4 mg, 4 mg, Intravenous, Q12H, 4 mg at 10/26/24 1055 **FOLLOWED BY** [START ON 10/27/2024] dexamethasone  (DECADRON ) injection 2 mg, 2 mg, Intravenous, Q12H **FOLLOWED BY** [START ON 10/29/2024] dexamethasone  (DECADRON ) injection 2 mg, 2 mg, Intravenous, Q24H, [name], MD   enoxaparin  (LOVENOX ) injection 40 mg, 40 mg, Subcutaneous, Q24H, [name], MD, 40 mg at 10/25/24 1728   insulin  aspart (novoLOG ) injection 0-15 Units, 0-15 Units, Subcutaneous, TID WC, Danton Reyes DASEN, MD   insulin  glargine-yfgn (SEMGLEE ) injection 8 Units, 8 Units, Subcutaneous, Daily, Danton Reyes DASEN, MD, 8 Units at 10/26/24 1055   levETIRAcetam  (KEPPRA ) tablet 1,000 mg, 1,000 mg, Oral, BID, Stretch, [name], MD, 1,000 mg at 10/26/24 1055   mirtazapine  (REMERON ) tablet 7.5 mg, 7.5 mg, Oral, QHS, Danton Reyes DASEN, MD, 7.5 mg at 10/25/24 2202   ondansetron  (ZOFRAN ) tablet 4 mg, 4 mg, Oral, Q6H PRN **OR** ondansetron  (ZOFRAN ) injection 4 mg, 4 mg, Intravenous, Q6H PRN, [name], [name], MD   Oral care mouth rinse, 15 mL, Mouth Rinse, PRN, [name], [name], MD   pantoprazole  (PROTONIX ) EC tablet 40 mg, 40 mg, Oral, QHS, Stretch, [name], MD, 40 mg at 10/25/24 2202   polyethylene glycol (MIRALAX  / GLYCOLAX ) packet 17 g, 17 g, Oral, Daily PRN, [name], [name], MD   senna (SENOKOT) tablet 8.6 mg, 1 tablet, Oral, Daily, Danton Reyes T, MD, 8.6 mg at 10/26/24 1055  sodium chloride  flush (NS) 0.9 % injection 10-40 mL, 10-40 mL, Intracatheter, Q12H, [name], [name], MD, 10 mL at 10/25/24 2212   sodium chloride  flush (NS) 0.9 % injection 10-40 mL, 10-40 mL, Intracatheter, PRN, [name], MD  Labs and Diagnostic Imaging   CBC:  Recent Labs  Lab 10/22/24 0540 10/26/24 0520  WBC 6.9 6.7  HGB 12.9* 13.1  HCT 35.5* 38.5*  MCV 90.8 95.1  PLT 192 264    Basic Metabolic Panel:  Lab Results  Component Value Date   NA 137 10/26/2024   K 3.8 10/26/2024    CO2 25 10/26/2024   GLUCOSE 92 10/26/2024   BUN 22 10/26/2024   CREATININE 0.77 10/26/2024   CALCIUM  8.3 (L) 10/26/2024   GFRNONAA >60 10/26/2024   GFRAA 92 01/16/2021   Lipid Panel:  Lab Results  Component Value Date   LDLCALC 155 (H) 06/13/2024   HgbA1c:  Lab Results  Component Value Date   HGBA1C 5.6 10/23/2024   INR  Lab Results  Component Value Date   INR 1.1 10/17/2024   APTT  Lab Results  Component Value Date   APTT 26 11/19/2019    Labs and Diagnostic Imaging  CSF WBC 60 CSF RBC 3200(suspect mildly traumatic given colorless supernatant) CSF protein 92 CSF glucose 55 CSF meningitis panel (+) for HSV1  Imaging reviewed personally   MRI brain wwo 11/12 Abnormal diffusion and FLAIR signal abnormality involving the left insular cortex and inferior left frontal lobe, as well as the mesial left temporal lobe 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 Areas of intermixed mild hyperdensity is suspicious for non-mass occupying acute hemorrhage. Repeat MRI with contrast could further characterize if clinically warranted.  LTM 11/16 - 11/17 : ABNORMALITY - Continuous slow, generalized and lateralized left hemisphere IMPRESSION: This study is showed cortical dysfunction arising from left hemisphere likely secondary to underlying structural abnormality. Additionally there is generalized cerebral dysfunction (encephalopathy). No seizures were seen throughout the recording.  LTM 11/17-11/18: ABNORMALITY - Continuous slow, generalized and lateralized left hemisphere   IMPRESSION: This study is showed cortical dysfunction arising from left hemisphere likely secondary to underlying structural  abnormality. Additionally there is generalized cerebral dysfunction (encephalopathy). No seizures were seen throughout the recording.  LTM 11/18-11/19: ABNORMALITY - Continuous slow, generalized   IMPRESSION: This study is suggestive of generalized cerebral dysfunction (encephalopathy). No seizures were seen throughout the recording.   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  Keppra  increased to 1g bid on 11/16 2/2 continued irritability on EEG.   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 Decadron  started. Weak evidence to support decadron  is HSV encephalitis in general but given degree of cerebral edema we did not have much of a choice. His R sided weakness has improved since starting the decadron .  Cerebral edema does tend to worse as treatment with acyclovir  takes effect in my limited experience of patients that have seen and I would imagine that he will continue to do well as time progresses.  Impression HSV encephalitis  Recommendations  -Continue Keppra  1g twice daily-I will not discontinue this even on discharge and will continue it indefinitely for now  - Steroid taper as previously recommended Inpatient neurology will be available as needed Please call with questions Plan discussed with Dr. Danton -- Eligio Lav, MD Neurologist [facility]

## 2024-10-26 NOTE — Progress Notes (Signed)
 Pharmacy Antibiotic Note  [name] is a 63 y.o. 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  Pharmacy has been consulted for Acyclovir  dosing.  Completed Zoysn for HAP on 11/19.  Creatinine stable, BUN trended up some. Working on PO intake. Dexamethasone  taper in place.  Plan: Continue Acyclovir  800 mg (~10 mg/kg/dose) IV q8hrs. NS at 50 ml/hr while on Acyclovir . Monitor renal function, clinical progress. Noted plan for 21-day course, through 11/07/24  Height: 5' 5 (165.1 cm) Weight: 80.4 kg (177 lb 4 oz) IBW/kg (Calculated) : 61.5  Temp (24hrs), Avg:97.9 F (36.6 C), Min:97.3 F (36.3 C), Max:98.2 F (36.8 C)  Recent Labs  Lab 10/19/24 1323 10/19/24 1847 10/19/24 1847 10/19/24 2320 10/20/24 0500 10/21/24 0320 10/22/24 0540 10/23/24 0637 10/26/24 0520  WBC  --  7.3  --   --  6.5 6.1 6.9  --  6.7  CREATININE  --  0.93   < >  --  0.83 0.86 0.71 0.68 0.77  LATICACIDVEN 1.0  --   --  1.0  --   --   --   --   --    < > = values in this interval not displayed.    Estimated Creatinine Clearance: 94.8 mL/min (by C-G formula based on SCr of 0.77 mg/dL).    Allergies  Allergen Reactions   Bee Venom Rash   Morphine And Codeine Swelling    Antimicrobials this admission: Azithromycin  IV x 1 on 11/12  Ceftriaxone  11/12>>11/13  Ampicillin  11/12 >> 11/13 Vancomycin  11/12 >> 11/13 Acyclovir  11/12 >>(12/3) Zyvox  x 1 11/17 Zosyn  11/17 >> 11/19  Dose adjustments this admission: n/a  Microbiology results: 11/13 CSF: positive for HSV1 11/12 blood: negative 11/12 urine: negative 11/12 COVID, flu and RSV: neg 11/13 CSF gram stain: negative 11/13 VDRL: negative 11/14 MRSA PCR: negative 11/17 tracheal aspirate: normal respiratory flora - no Staph aureus or Pseudomonas seen  Thank you for allowing pharmacy to be a part of this patient's care.  [name], RPh 10/26/2024 1:20 PM

## 2024-10-27 DIAGNOSIS — G936 Cerebral edema: Secondary | ICD-10-CM | POA: Diagnosis not present

## 2024-10-27 LAB — GLUCOSE, CAPILLARY
Glucose-Capillary: 119 mg/dL — ABNORMAL HIGH (ref 70–99)
Glucose-Capillary: 88 mg/dL (ref 70–99)
Glucose-Capillary: 103 mg/dL — ABNORMAL HIGH (ref 70–99)

## 2024-10-27 NOTE — Plan of Care (Signed)
  Problem: Education: Goal: Knowledge of General Education information will improve Description: Including pain rating scale, medication(s)/side effects and non-pharmacologic comfort measures Outcome: Progressing   Problem: Health Behavior/Discharge Planning: Goal: Ability to manage health-related needs will improve Outcome: Progressing   Problem: Clinical Measurements: Goal: Ability to maintain clinical measurements within normal limits will improve Outcome: Progressing Goal: Will remain free from infection Outcome: Progressing Goal: Diagnostic test results will improve Outcome: Progressing Goal: Respiratory complications will improve Outcome: Progressing Goal: Cardiovascular complication will be avoided Outcome: Progressing   Problem: Activity: Goal: Risk for activity intolerance will decrease Outcome: Progressing   Problem: Nutrition: Goal: Adequate nutrition will be maintained Outcome: Progressing   Problem: Coping: Goal: Level of anxiety will decrease Outcome: Progressing   Problem: Elimination: Goal: Will not experience complications related to bowel motility Outcome: Progressing Goal: Will not experience complications related to urinary retention Outcome: Progressing   Problem: Pain Managment: Goal: General experience of comfort will improve and/or be controlled Outcome: Progressing   Problem: Safety: Goal: Ability to remain free from injury will improve Outcome: Progressing   Problem: Skin Integrity: Goal: Risk for impaired skin integrity will decrease Outcome: Progressing   Problem: Medication: Goal: Risk for medication side effects will decrease Outcome: Progressing   Problem: Clinical Measurements: Goal: Complications related to the disease process, condition or treatment will be avoided or minimized Outcome: Progressing Goal: Diagnostic test results will improve Outcome: Progressing

## 2024-10-27 NOTE — Progress Notes (Signed)
 [name]  FMW:[id number] DOB: 1961-06-11 DOA: 10/17/2024 PCP: Dettinger, Fonda LABOR, MD    Brief Narrative:  63 year old with a history of osteoarthritis, basal cell carcinoma of the left arm, GERD, and prior SBO who presented to the [facility] ED 11/12 with 3 weeks of gradually progressive generalized weakness and a fall on 11/12.  In the ER the patient displayed significant somnolence with declining mental status.  MRI of the head revealed abnormal diffusion with FLAIR signal abnormality of the insula and left cortex inferior left frontal lobe and mesial left temporal lobe suggestive of acute encephalitis.  LP revealed CSF WBC of 60 with protein of 92 and glucose of 55 with meningitis panel positive for HSV 1.  Significant Events: 11/12 admit for AMS, generalized weakness, fall - LP indicating HSV1 encephalitis 11/14 worsening altered mental status with seizure activity, started on AEDs, then transferred to ICU for intubation as pt could not protect airway 11/18 extubated and placed on 3L [place], saturating well 11/20 [facility] assumed care   Goals of Care:   Code Status: Full Code   DVT prophylaxis: enoxaparin  (LOVENOX ) injection 40 mg Start: 10/20/24 1545 SCDs Start: 10/17/24 2340   Interim Hx: No acute events recorded overnight.  Vital signs stable.  Afebrile.  CBG controlled.  Resting comfortably at time of visit.  No new complaints.  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 ID recommends 3 weeks of IV acyclovir  therapy (through 11/07/2024) - decadron  as directed by Neurology [Days 1-2 (11/18-11/19): 4mg  IV q 8 hrs > Days 3-4: 4mg  IV q 12 hrs > Days 5-6: 2mg  IV q 12 hrs > Days 7-8: 2mg  IV daily > discontinue] -appears to be improving at a more rapid pace at this time -downgrade from progressive care and continue to work on mobility  Seizures Due to above - continue Keppra  as per Neurology  Septic shock due to encephalitis Resolved  Acute  hypoxic respiratory failure due to seizures and encephalopathy Required intubation and mechanical ventilation 11/14-11/18 - now resolved  Steroid-induced hyperglycemia CBG very well-controlled at present  GERD Continue PPI   Family Communication: I spoke with the patient's spouse at bedside. Disposition: Should be medically stable for disposition in approximately 24 hours - awaiting placement in CIR Acute Inpatient Rehab (3hours/Day)10/26/2024 1159  Objective: Blood pressure 102/71, pulse 60, temperature 97.9 F (36.6 C), temperature source Oral, resp. rate 20, height 5' 5 (1.651 m), weight 80.4 kg, SpO2 96%.  Intake/Output Summary (Last 24 hours) at 10/27/2024 0925 Last data filed at 10/27/2024 0800 Gross per 24 hour  Intake 1774.36 ml  Output 2400 ml  Net -625.64 ml   Filed Weights   10/22/24 0341 10/23/24 0419 10/24/24 0500  Weight: 80.6 kg 81.2 kg 80.4 kg    Examination: General: No acute respiratory distress Lungs: Clear to auscultation bilaterally without wheezes or crackles Cardiovascular: RRR without murmur gallop or rub Abdomen: NT/ND, soft, BS positive, no rebound Extremities: No significant cyanosis, clubbing, or edema bilateral lower extremities  CBC: Recent Labs  Lab 10/21/24 0320 10/22/24 0540 10/26/24 0520  WBC 6.1 6.9 6.7  HGB 14.1 12.9* 13.1  HCT 38.8* 35.5* 38.5*  MCV 90.2 90.8 95.1  PLT 167 192 264   Basic Metabolic Panel: Recent Labs  Lab 10/22/24 0540 10/23/24 0637 10/24/24 0655 10/26/24 0520  NA 132* 136  --  137  K 4.6 4.1  --  3.8  CL 98 101  --  103  CO2  25 23  --  25  GLUCOSE 146* 209*  --  92  BUN 13 17  --  22  CREATININE 0.71 0.68  --  0.77  CALCIUM  8.3* 8.8*  --  8.3*  MG 2.2  --   --  2.3  PHOS 2.4* 2.8 3.1  --    GFR: Estimated Creatinine Clearance: 94.8 mL/min (by C-G formula based on SCr of 0.77 mg/dL).   Scheduled Meds:  Chlorhexidine  Gluconate Cloth  6 each Topical Daily   dexamethasone  (DECADRON ) injection   2 mg Intravenous Q12H   Followed by   NOREEN ON 10/29/2024] dexamethasone  (DECADRON ) injection  2 mg Intravenous Q24H   enoxaparin  (LOVENOX ) injection  40 mg Subcutaneous Q24H   feeding supplement  237 mL Oral BID BM   insulin  aspart  0-15 Units Subcutaneous TID WC   levETIRAcetam   1,000 mg Oral BID   mirtazapine   7.5 mg Oral QHS   multivitamin with minerals  1 tablet Oral Daily   nutrition supplement (JUVEN)  1 packet Oral BID BM   pantoprazole   40 mg Oral QHS   senna  1 tablet Oral Daily   sodium chloride  flush  10-40 mL Intracatheter Q12H   Continuous Infusions:  sodium chloride  50 mL/hr at 10/27/24 0818   acyclovir  800 mg (10/27/24 0532)     LOS: 10 days   Reyes IVAR Moores, MD [facility] Office  [phone] Pager - Text Page per Tracey  If 7PM-7AM, please contact night-coverage per Amion 10/27/2024, 9:25 AM

## 2024-10-27 NOTE — Progress Notes (Signed)
 IP rehab admissions - I met with patient, his wife and her sister at the bedside.  I gave them rehab booklets and explained CIR process.  They are interested in CIR prior to home with wife.  I will have my partners follow up on Monday for progress and plans.  [phone]

## 2024-10-27 NOTE — Progress Notes (Signed)
 Mobility Specialist Progress Note;   10/27/24 1007  Mobility  Activity Ambulated with assistance  Level of Assistance Minimal assist, patient does 75% or more  Assistive Device Front wheel walker  Distance Ambulated (ft) 175 ft  Activity Response Tolerated well  Mobility Referral Yes  Mobility visit 1 Mobility  Mobility Specialist Start Time (ACUTE ONLY) 1007  Mobility Specialist Stop Time (ACUTE ONLY) 1027  Mobility Specialist Time Calculation (min) (ACUTE ONLY) 20 min   Pt agreeable to mobility. Required heavier MinA during ambulation d/t posterior lean. Pt w/ slow, short steps during ambulation. VSS throughout and no c/o when asked. Deferred sitting in chair at this time. Pt returned back to bed and left with all needs met, alarm on.   Lauraine Erm Mobility Specialist Please contact via SecureChat or [employer] [phone]

## 2024-10-28 DIAGNOSIS — G936 Cerebral edema: Secondary | ICD-10-CM | POA: Diagnosis not present

## 2024-10-28 LAB — GLUCOSE, CAPILLARY: Glucose-Capillary: 151 mg/dL — ABNORMAL HIGH (ref 70–99)

## 2024-10-28 NOTE — Plan of Care (Signed)
  Problem: Education: Goal: Knowledge of General Education information will improve Description: Including pain rating scale, medication(s)/side effects and non-pharmacologic comfort measures Outcome: Progressing   Problem: Health Behavior/Discharge Planning: Goal: Ability to manage health-related needs will improve Outcome: Progressing   Problem: Clinical Measurements: Goal: Ability to maintain clinical measurements within normal limits will improve Outcome: Progressing Goal: Will remain free from infection Outcome: Progressing Goal: Diagnostic test results will improve Outcome: Progressing Goal: Respiratory complications will improve Outcome: Progressing Goal: Cardiovascular complication will be avoided Outcome: Progressing   Problem: Activity: Goal: Risk for activity intolerance will decrease Outcome: Progressing   Problem: Nutrition: Goal: Adequate nutrition will be maintained Outcome: Progressing   Problem: Coping: Goal: Level of anxiety will decrease Outcome: Progressing   Problem: Elimination: Goal: Will not experience complications related to bowel motility Outcome: Progressing Goal: Will not experience complications related to urinary retention Outcome: Progressing   Problem: Pain Managment: Goal: General experience of comfort will improve and/or be controlled Outcome: Progressing   Problem: Safety: Goal: Ability to remain free from injury will improve Outcome: Progressing   Problem: Skin Integrity: Goal: Risk for impaired skin integrity will decrease Outcome: Progressing   Problem: Medication: Goal: Risk for medication side effects will decrease Outcome: Progressing   Problem: Clinical Measurements: Goal: Complications related to the disease process, condition or treatment will be avoided or minimized Outcome: Progressing Goal: Diagnostic test results will improve Outcome: Progressing

## 2024-10-28 NOTE — PMR Pre-admission (Signed)
 PMR Admission Coordinator Pre-Admission Assessment  Patient: [name] is an 63 y.o., male MRN: [id number] DOB: 19-Feb-1961 Height: 5' 5 (165.1 cm) Weight: 80.4 kg  Insurance Information HMO: ***    PPO: ***     PCP: ***     IPA: ***     80/20: ***     OTHER: *** PRIMARY: BCBS Comm PPO      Policy#: [id number]      Subscriber: patient CM Name: ***      Phone#: ***     Fax#: *** Pre-Cert#: ***      Employer: [employer] Benefits:  Phone #: [phone]     Name: *** Eff. Date: ***     Deduct: ***      Out of Pocket Max: ***      Life Max: *** CIR: ***      SNF: *** Outpatient: ***     Co-Pay: *** Home Health: ***      Co-Pay: *** DME: ***     Co-Pay: *** Providers: in network  SECONDARY:       Policy#:      Phone#:   Artist:       Phone#:   The Data Processing Manager" for patients in Inpatient Rehabilitation Facilities with attached "Privacy Act Statement-Health Care Records" was provided and verbally reviewed with: Patient and Family  Emergency Contact Information Contact Information     Name Relation Home Work Mobile   Fehl,[name] Spouse [phone]  [phone]      Other Contacts     Name Relation Home Work Mobile   [place] Sister [phone]         Current Medical History  Patient Admitting Diagnosis: HSV encephalitis  History of Present Illness: A 64 y.o. male with hx of arthritis, GERD, prior SBO who presents with about 3 weeks of worsening generalized weakness, cough, congestion. He fell last night at home and was very confused and family brought him to the ED at [facility] on 10/17/24. Noted to be febrile to 102.2. He had MRI Brain w/o contrast which demonstrated L insula and frontal lobe DWI and FLAIR signal abnormality concerning for infectious or inflammatory encephalitis.  No obvious seizures. He was admitted to [facility] for neurology evaluation. He was started on empiric meningitis coverage with Vanc,  ceftriaxone , ampicillin  and acyclovir .  LP at the bedside was attempted x 2 at [facility], ED and was unsuccessful.  Patient is mildly confused on unable to provide reliable history.  PT/OT/SLP evaluations completed with recommendations for acute inpatient rehab admission.   Patient's medical record from [facility] has been reviewed by the rehabilitation admission coordinator and physician.  Past Medical History  Past Medical History:  Diagnosis Date   Arthritis    Calcaneus fracture, left    Cancer (HCC)    Skin cancer Left Arm   GERD (gastroesophageal reflux disease)    Small bowel obstruction (HCC)     Has the patient had major surgery during 100 days prior to admission? No  Family History   family history includes Alzheimer's disease in his father; Cancer in his maternal grandmother and mother; Early death in his brother; Heart disease in his father and sister; Stroke in his mother.  Current Medications  Current Facility-Administered Medications:    0.9 %  sodium chloride  infusion, , Intravenous, Continuous, Danton Reyes DASEN, MD, Last Rate: 50 mL/hr at 10/28/24 0503, New Bag at 10/28/24 0503   acetaminophen  (TYLENOL ) tablet 650  mg, 650 mg, Oral, Q6H PRN **OR** [DISCONTINUED] acetaminophen  (TYLENOL ) suppository 650 mg, 650 mg, Rectal, Q6H PRN, [name], MD   acyclovir  (ZOVIRAX ) 800 mg in dextrose  5 % 250 mL IVPB, 800 mg, Intravenous, Q8H, Vu, Trung T, MD, Last Rate: 266 mL/hr at 10/28/24 0504, 800 mg at 10/28/24 0504   Chlorhexidine  Gluconate Cloth 2 % PADS 6 each, 6 each, Topical, Daily, [name], [name], MD, 6 each at 10/28/24 0953   [COMPLETED] dexamethasone  (DECADRON ) injection 4 mg, 4 mg, Intravenous, Q8H, 4 mg at 10/25/24 1357 **FOLLOWED BY** [COMPLETED] dexamethasone  (DECADRON ) injection 4 mg, 4 mg, Intravenous, Q12H, 4 mg at 10/27/24 0810 **FOLLOWED BY** dexamethasone  (DECADRON ) injection 2 mg, 2 mg, Intravenous, Q12H, 2 mg at 10/28/24 0945 **FOLLOWED  BY** [START ON 10/29/2024] dexamethasone  (DECADRON ) injection 2 mg, 2 mg, Intravenous, Q24H, [name], MD   enoxaparin  (LOVENOX ) injection 40 mg, 40 mg, Subcutaneous, Q24H, [name], MD, 40 mg at 10/27/24 1655   feeding supplement (ENSURE PLUS HIGH PROTEIN) liquid 237 mL, 237 mL, Oral, BID BM, Danton Reyes DASEN, MD, 237 mL at 10/27/24 1217   levETIRAcetam  (KEPPRA ) tablet 1,000 mg, 1,000 mg, Oral, BID, Stretch, [name], MD, 1,000 mg at 10/28/24 0945   mirtazapine  (REMERON ) tablet 7.5 mg, 7.5 mg, Oral, QHS, Danton Reyes DASEN, MD, 7.5 mg at 10/27/24 2257   multivitamin with minerals tablet 1 tablet, 1 tablet, Oral, Daily, Danton Reyes DASEN, MD, 1 tablet at 10/28/24 0945   nutrition supplement (JUVEN) (JUVEN) powder packet 1 packet, 1 packet, Oral, BID BM, Danton Reyes DASEN, MD, 1 packet at 10/28/24 0945   ondansetron  (ZOFRAN ) tablet 4 mg, 4 mg, Oral, Q6H PRN **OR** ondansetron  (ZOFRAN ) injection 4 mg, 4 mg, Intravenous, Q6H PRN, [name], [name], MD   Oral care mouth rinse, 15 mL, Mouth Rinse, PRN, [name], [name], MD   pantoprazole  (PROTONIX ) EC tablet 40 mg, 40 mg, Oral, QHS, Stretch, [name], MD, 40 mg at 10/27/24 2257   polyethylene glycol (MIRALAX  / GLYCOLAX ) packet 17 g, 17 g, Oral, Daily PRN, [name], [name], MD   senna (SENOKOT) tablet 8.6 mg, 1 tablet, Oral, Daily, Danton Reyes T, MD, 8.6 mg at 10/28/24 0945   sodium chloride  flush (NS) 0.9 % injection 10-40 mL, 10-40 mL, Intracatheter, Q12H, [name], [name], MD, 10 mL at 10/27/24 9185   sodium chloride  flush (NS) 0.9 % injection 10-40 mL, 10-40 mL, Intracatheter, PRN, [name], MD  Patients Current Diet:  Diet Order             Diet regular Room service appropriate? Yes; Fluid consistency: Thin  Diet effective now                   Precautions / Restrictions Precautions Precautions: Fall Precaution/Restrictions Comments: LTM EEG, cortrak Restrictions Weight Bearing  Restrictions Per Provider Order: No   Has the patient had 2 or more falls or a fall with injury in the past year? Yes  Prior Activity Level Community (5-7x/wk): Worked FT, was driving  Prior Functional Level Self Care: Did the patient need help bathing, dressing, using the toilet or eating? Independent  Indoor Mobility: Did the patient need assistance with walking from room to room (with or without device)? Independent  Stairs: Did the patient need assistance with internal or external stairs (with or without device)? Independent  Functional Cognition: Did the patient need help planning regular tasks such as shopping or remembering to take medications? Independent  Patient Information Are you of  Hispanic, Latino/a,or Spanish origin?: A. No, not of Hispanic, Latino/a, or Spanish origin What is your race?: A. White Do you need or want an interpreter to communicate with a doctor or health care staff?: 0. No Patient information obtained via proxy : Yes  Patient's Response To:  Health Literacy and Transportation Is the patient able to respond to health literacy and transportation needs?: No Health Literacy - How often do you need to have someone help you when you read instructions, pamphlets, or other written material from your doctor or pharmacy?: Patient unable to respond In the past 12 months, has lack of transportation kept you from medical appointments or from getting medications?: No In the past 12 months, has lack of transportation kept you from meetings, work, or from getting things needed for daily living?: No Higher Education Careers Adviser obtained via proxy: Yes  Journalist, Newspaper / Equipment Home Equipment: Agricultural Consultant (2 wheels), BSC/3in1, [place] - single point  Prior Device Use: Indicate devices/aids used by the patient prior to current illness, exacerbation or injury? None of the above  Current Functional Level Cognition  Orientation Level: Oriented to person,  Oriented to place    Extremity Assessment (includes Sensation/Coordination)  Upper Extremity Assessment: Right hand dominant, RUE deficits/detail, LUE deficits/detail, Generalized weakness RUE Deficits / Details: generalized weakness; decreased proprioception with sensation otherwise WFL; decreased coordination; AROM WFL; mildly edematous hand and wrist RUE Sensation: decreased proprioception RUE Coordination: decreased fine motor, decreased gross motor LUE Deficits / Details: generalized weakness; decreased proprioception with sensation otherwise WFL; decreased coordination; AROM WFL; mildly edematous hand and wrist LUE Sensation: decreased proprioception LUE Coordination: decreased fine motor, decreased gross motor  Lower Extremity Assessment: Defer to PT evaluation    ADLs  Overall ADL's : Needs assistance/impaired Eating/Feeding: Minimal assistance, Cueing for sequencing, Cueing for safety, Sitting Grooming: Wash/dry hands, Wash/dry face, Brushing hair, Oral care, Sitting, Moderate assistance (shampoo cap) Grooming Details (indicate cue type and reason): decreased thoroughness, cues for sequencing Upper Body Bathing: Moderate assistance, Cueing for sequencing, Cueing for compensatory techniques, Sitting Lower Body Bathing: Maximal assistance, Total assistance, Cueing for safety, Cueing for compensatory techniques, Cueing for sequencing, Bed level, +2 for safety/equipment Upper Body Dressing : Moderate assistance, Cueing for sequencing, Sitting Lower Body Dressing: Total assistance, Bed level, Cueing for sequencing, Cueing for compensatory techniques, Cueing for safety, +2 for safety/equipment Toilet Transfer Details (indicate cue type and reason): deferred this session for pt/therapist safety Toileting- Clothing Manipulation and Hygiene: Total assistance, +2 for safety/equipment, Bed level    Mobility  Overal bed mobility: Needs Assistance Bed Mobility: Supine to Sit Rolling: Min  assist Supine to sit: HOB elevated, Used rails, Contact guard General bed mobility comments: up in chair    Transfers  Overall transfer level: Needs assistance Equipment used: Rolling walker (2 wheels) Transfers: Sit to/from Stand Sit to Stand: Min assist General transfer comment: minimal physical assistance to rise, multimodal assist for technique with RW    Ambulation / Gait / Stairs / Wheelchair Mobility  Ambulation/Gait Ambulation/Gait assistance: Editor, Commissioning (Feet): 128 Feet Assistive device: Rolling walker (2 wheels) Gait Pattern/deviations: Step-through pattern, Decreased stride length, Narrow base of support General Gait Details: pt with cues for posture, proximity to RW, direction, pt not recognizing fatigue with cues to return to room, veering left at times and assist to steer away from obstacles. Pt urinating during gait (with primofit tubing attached) and unaware Gait velocity interpretation: <1.8 ft/sec, indicate of risk for recurrent falls  Posture / Balance Dynamic Sitting Balance Sitting balance - Comments: at edge of chair Balance Overall balance assessment: Needs assistance Sitting-balance support: No upper extremity supported Sitting balance-Leahy Scale: Fair Sitting balance - Comments: at edge of chair Postural control: Posterior lean Standing balance support: Bilateral upper extremity supported, During functional activity, Reliant on assistive device for balance Standing balance-Leahy Scale: Poor Standing balance comment: B UE support and min assist    Special considerations/life events  Skin *** and Special service needs Zovirax  IVPB   Previous Home Environment (from acute therapy documentation) Living Arrangements: Spouse/significant other (wife) Available Help at Discharge: Family, Available 24 hours/day Type of Home: Mobile home Home Layout: One level Home Access: Ramped entrance Bathroom Shower/Tub: Tub/shower unit, Architectural Technologist: Standard Home Care Services: No Additional Comments: PLOF and home setup provided by niece during session as pt not a reliable reporter and providing conflicting information  Discharge Living Setting Plans for Discharge Living Setting: Patient's home, House, Lives with (comment) (Lives with wife) Type of Home at Discharge: House Discharge Home Layout: One level Discharge Home Access: Ramped entrance Discharge Bathroom Shower/Tub: Walk-in shower, Curtain Discharge Bathroom Toilet: Handicapped height Discharge Bathroom Accessibility: Yes How Accessible: Accessible via walker Does the patient have any problems obtaining your medications?: No  Social/Family/Support Systems Patient Roles: Spouse, Other (Comment) (Has spouse, sister, nephew) Contact Information: Luisdavid Hamblin - spouse - [phone] Anticipated Caregiver: wife, sister and nephew Anticipated Caregiver's Contact Information: Dagoberto Ka - sister - [phone] Ability/Limitations of Caregiver: Family can assist as needed. Caregiver Availability: 24/7 Discharge Plan Discussed with Primary Caregiver: Yes Is Caregiver In Agreement with Plan?: Yes Does Caregiver/Family have Issues with Lodging/Transportation while Pt is in Rehab?: No  Goals Patient/Family Goal for Rehab: PT/OT/SLP mod I and supervision goals Expected length of stay: 7-10 days Pt/Family Agrees to Admission and willing to participate: Yes Program Orientation Provided & Reviewed with Pt/Caregiver Including Roles  & Responsibilities: Yes  Decrease burden of Care through IP rehab admission: N/A  Possible need for SNF placement upon discharge: Not planned  Patient Condition: I have reviewed medical records from [facility], spoken with CSW, and patient, spouse, and family member. I met with patient at the bedside for inpatient rehabilitation assessment.  Patient will benefit from ongoing PT, OT, and SLP, can actively participate in 3  hours of therapy a day 5 days of the week, and can make measurable gains during the admission.  Patient will also benefit from the coordinated team approach during an Inpatient Acute Rehabilitation admission.  The patient will receive intensive therapy as well as Rehabilitation physician, nursing, social worker, and care management interventions.  Due to bladder management, bowel management, safety, skin/wound care, disease management, medication administration, pain management, and patient education the patient requires 24 hour a day rehabilitation nursing.  The patient is currently *** with mobility and basic ADLs.  Discharge setting and therapy post discharge at home with home health is anticipated.  Patient has agreed to participate in the Acute Inpatient Rehabilitation Program and will admit {Time; today/tomorrow:10263}.  Preadmission Screen Completed By:  Lovett CHRISTELLA Ropes, 10/28/2024 10:39 AM ______________________________________________________________________   Discussed status with Dr. PIERRETTE on *** at *** and received approval for admission today.  Admission Coordinator:  Lovett CHRISTELLA Ropes, RN, time PIERRETTEPattricia ***   Assessment/Plan: Diagnosis: *** Does the need for close, 24 hr/day Medical supervision in concert with the patient's rehab needs make it unreasonable for this patient to be served in a less intensive setting? {  yes_no_potentially:3041433} Co-Morbidities requiring supervision/potential complications: *** Due to {due un:6958565}, does the patient require 24 hr/day rehab nursing? {yes_no_potentially:3041433} Does the patient require coordinated care of a physician, rehab nurse, PT, OT, and SLP to address physical and functional deficits in the context of the above medical diagnosis(es)? {yes_no_potentially:3041433} Addressing deficits in the following areas: {deficits:3041436} Can the patient actively participate in an intensive therapy program of at least 3 hrs of therapy 5 days a week?  {yes_no_potentially:3041433} The potential for patient to make measurable gains while on inpatient rehab is {potential:3041437} Anticipated functional outcomes upon discharge from inpatient rehab: {functional outcomes:304600100} PT, {functional outcomes:304600100} OT, {functional outcomes:304600100} SLP Estimated rehab length of stay to reach the above functional goals is: *** Anticipated discharge destination: {anticipated dc setting:21604} 10. Overall Rehab/Functional Prognosis: {potential:3041437}   MD Signature: ***

## 2024-10-28 NOTE — Progress Notes (Signed)
 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  In the ER the patient displayed significant somnolence with declining mental status.  MRI of the head revealed abnormal diffusion with FLAIR signal abnormality of the insula and left cortex inferior left frontal lobe and mesial left temporal lobe suggestive of acute encephalitis.  LP revealed CSF WBC of 60 with protein of 92 and glucose of 55 with meningitis panel positive for HSV 1.  Significant Events: 11/12 admit for AMS, generalized weakness, fall - LP indicating HSV1 encephalitis 11/14 worsening altered mental status with seizure activity, started on AEDs, then transferred to ICU for intubation as pt could not protect airway 11/18 extubated and placed on 3L , saturating well 11/20 TRH assumed care   Goals of Care:   Code Status: Full Code   DVT prophylaxis: enoxaparin  (LOVENOX ) injection 40 mg Start: 10/20/24 1545 SCDs Start: 10/17/24 2340   Interim Hx: No events recorded overnight.  Afebrile.  Vital signs stable.  Voice remains hoarse.  No new complaints otherwise.  Eating lunch at time of visit without difficulty.  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 ID recommends 3 weeks of IV acyclovir  therapy (through 11/07/2024) - decadron  as directed by Neurology [Days 1-2 (11/18-11/19): 4mg  IV q 8 hrs > Days 3-4: 4mg  IV q 12 hrs > Days 5-6: 2mg  IV q 12 hrs > Days 7-8: 2mg  IV daily > discontinue] - appears to be improving at a more rapid pace at this time - continue to work on mobility  Seizures Due to above - continue Keppra  as per Neurology  Septic shock due to encephalitis Resolved  Acute hypoxic  respiratory failure due to seizures and encephalopathy Required intubation and mechanical ventilation 11/14-11/18 - now resolved  Steroid-induced hyperglycemia CBG very well-controlled at present  GERD Continue PPI   Family Communication: Spoke with multiple family members at bedside. Disposition: Is medically stable for disposition - awaiting placement in CIR Acute Inpatient Rehab (3hours/Day)10/26/2024 1159  Objective: Blood pressure 99/69, pulse 78, temperature 98.4 F (36.9 C), temperature source Oral, resp. rate 20, height 5' 5 (1.651 m), weight 80.4 kg, SpO2 99%.  Intake/Output Summary (Last 24 hours) at 10/28/2024 1002 Last data filed at 10/28/2024 9167 Gross per 24 hour  Intake 480 ml  Output 2910 ml  Net -2430 ml   Filed Weights   10/22/24 0341 10/23/24 0419 10/24/24 0500  Weight: 80.6 kg 81.2 kg 80.4 kg    Examination: General: No acute respiratory distress Lungs: CTAB without wheezes or crackles Cardiovascular: RRR without murmur gallop or rub Abdomen: NT/ND, soft, BS positive, no rebound Extremities: No significant cyanosis, clubbing, or edema bilateral lower extremities  CBC: Recent Labs  Lab 10/22/24 0540 10/26/24 0520  WBC 6.9 6.7  HGB 12.9* 13.1  HCT 35.5* 38.5*  MCV 90.8 95.1  PLT 192 264   Basic Metabolic Panel: Recent Labs  Lab 10/22/24 0540 10/23/24 0637 10/24/24 0655 10/26/24 0520  NA 132* 136  --  137  K 4.6 4.1  --  3.8  CL 98 101  --  103  CO2 25 23  --  25  GLUCOSE 146* 209*  --  92  BUN  13 17  --  22  CREATININE 0.71 0.68  --  0.77  CALCIUM  8.3* 8.8*  --  8.3*  MG 2.2  --   --  2.3  PHOS 2.4* 2.8 3.1  --    GFR: Estimated Creatinine Clearance: 94.8 mL/min (by C-G formula based on SCr of 0.77 mg/dL).   Scheduled Meds:  Chlorhexidine  Gluconate Cloth  6 each Topical Daily   dexamethasone  (DECADRON ) injection  2 mg Intravenous Q12H   Followed by   NOREEN ON 10/29/2024] dexamethasone  (DECADRON ) injection  2 mg Intravenous  Q24H   enoxaparin  (LOVENOX ) injection  40 mg Subcutaneous Q24H   feeding supplement  237 mL Oral BID BM   levETIRAcetam   1,000 mg Oral BID   mirtazapine   7.5 mg Oral QHS   multivitamin with minerals  1 tablet Oral Daily   nutrition supplement (JUVEN)  1 packet Oral BID BM   pantoprazole   40 mg Oral QHS   senna  1 tablet Oral Daily   sodium chloride  flush  10-40 mL Intracatheter Q12H   Continuous Infusions:  sodium chloride  50 mL/hr at 10/28/24 0503   acyclovir  800 mg (10/28/24 0504)     LOS: 11 days   Reyes IVAR Moores, MD [facility] Office  [phone] Pager - Text Page per Tracey  If 7PM-7AM, please contact night-coverage per Amion 10/28/2024, 10:02 AM

## 2024-10-29 ENCOUNTER — Encounter: Payer: Self-pay | Admitting: Internal Medicine

## 2024-10-29 DIAGNOSIS — G936 Cerebral edema: Secondary | ICD-10-CM | POA: Diagnosis not present

## 2024-10-29 LAB — BASIC METABOLIC PANEL WITH GFR
Anion gap: 12 (ref 5–15)
BUN: 25 mg/dL — ABNORMAL HIGH (ref 8–23)
CO2: 22 mmol/L (ref 22–32)
Calcium: 8.3 mg/dL — ABNORMAL LOW (ref 8.9–10.3)
Chloride: 103 mmol/L (ref 98–111)
Creatinine, Ser: 0.87 mg/dL (ref 0.61–1.24)
GFR, Estimated: >60 mL/min (ref >60)
Glucose, Bld: 89 mg/dL (ref 70–99)
Potassium: 3.8 mmol/L (ref 3.5–5.1)
Sodium: 137 mmol/L (ref 135–145)

## 2024-10-29 NOTE — Progress Notes (Signed)
 Physical Therapy Treatment Patient Details Name: [name] MRN: [id number] DOB: 1961-04-25 Today's Date: 10/29/2024   History of Present Illness 63 yo M adm 10/17/24 with weakness, fall, AMS, acute encephalopathy and sepsis. MRI with HSV encephalitis. 11/13 lumbar puncture. 11/14-11/18 VDRF. 11/14 seizure. 11/24 orthostatic hypotension during therapies. PMhx: HLD, anxiety, hernia    PT Comments  Pt received in chair 90 mins after working with OT, family present and supportive. Pt alert and oriented to month, self, location and situation but minimally verbal, hoarse voice when responding verbally, and slow processing when asked orientation/fatigue related questions. Pt needing up to minA to stand with RW support and up to [facility] for transfers with no AD/HHA and mod to maxA for dynamic standing tasks when unsupported. At baseline, pt is independent without AD and working, pt performed multiple seated/standing exercises then c/o fatigue, BP checked and pt found to be orthostatic, RN/MD notified. Pt with improving tolerance but orthostatic hypotension this session, recommend trial of BLE compression to see if this helps with hemodynamic stability. Patient will benefit from intensive inpatient follow-up therapy, >3 hours/day.  Orthostatic Lying   BP- Lying 120/80 (91) ((reclined in chair, taken after standing BP, as pt symptomatic decr in BP))  Pulse- Lying 75  Orthostatic Sitting  BP- Sitting 93/72 (80)  Pulse- Sitting 80  Orthostatic Standing at 0 minutes  BP- Standing at 0 minutes (!) 77/52 (60)  Pulse- Standing at 0 minutes 99     If plan is discharge home, recommend the following: A lot of help with bathing/dressing/bathroom;Assistance with feeding;Assistance with cooking/housework;Direct supervision/assist for financial management;Supervision due to cognitive status;Assist for transportation;Help with stairs or ramp for entrance;A lot of help with walking and/or transfers   Can travel by  private vehicle        Equipment Recommendations  Rolling walker (2 wheels) (continue to assess pending progress)    Recommendations for Other Services       Precautions / Restrictions Precautions Precautions: Fall Recall of Precautions/Restrictions: Impaired Precaution/Restrictions Comments: urinary incontinence, orthostatic hypotension 11/24 Restrictions Weight Bearing Restrictions Per Provider Order: No     Mobility  Bed Mobility Overal bed mobility: Needs Assistance             General bed mobility comments: pt received in chair    Transfers Overall transfer level: Needs assistance Equipment used: Rolling walker (2 wheels), None Transfers: Sit to/from Stand Sit to Stand: Min assist, Mod assist           General transfer comment: from chair with no AD and modA. From chair wtih RW and minA, decreased eccentric control to sit as he fatigues and pt does not recall to reach back for arm rests and delayed processing when cued to reach back; BP checked due to pt symptoms and pt found to be orthostatic. Pt performs reciprocal STS x 5 with HHA and modA, and needing minA to stand x2 reps to RW from chair, cues needed to initiate.    Ambulation/Gait Ambulation/Gait assistance: Mod assist, Max assist   Assistive device: None, 1 person hand held assist       Pre-gait activities: standing hip flexion with HHA/no AD and gait belt support, ~4 weight shifts L/R with minimal hip/knee flexion when pt cued to march with HHA, pt reaching for RW as it was brought to him prior to performing 5th rep, then able to perform x5 more marches, then when PTA asked if he was fatigued, pt agrees, and sits. BP soft standing with  MAP (60) and improved seated/supine and RN/MD notified. General Gait Details: see pre-gait activities, pt orthostatic and c/o 10/10 modified RPE post-standing and exercises, so defer gait trial away from chair.   Stairs             Wheelchair Mobility      Tilt Bed    Modified Rankin (Stroke Patients Only)       Balance Overall balance assessment: Needs assistance Sitting-balance support: No upper extremity supported Sitting balance-Leahy Scale: Fair Sitting balance - Comments: at edge of chair Postural control: Posterior lean Standing balance support: Bilateral upper extremity supported, During functional activity, Reliant on assistive device for balance Standing balance-Leahy Scale: Poor Standing balance comment: BUE support and min assist, mod/maxA with single UE support; fatigues quickly                            Communication Communication Communication: Impaired Factors Affecting Communication: Reduced clarity of speech;Other (comment) (low volume, frequently using gestures instead of attempting to vocalize)  Cognition Arousal: Alert Behavior During Therapy: Flat affect   PT - Cognitive impairments: Problem solving, Orientation, Awareness, Memory, Safety/Judgement, Difficult to assess, Sequencing Difficult to assess due to: Impaired communication Orientation impairments: Time                   PT - Cognition Comments: pt aware of self and that he is in the hospital. Spouse at times offering hints to him during orientation questions. When PTA asked what month it was (in view of calendar) and spouse stated the month with Thanksgiving before pt able to respond, pt then able to state november. PTA asked what holiday came next, and pt able to state correctly, with slight delay. Pt states his DOB correctly. Pt mostly non-verbal with nods/head shakes appropriately or very soft/hoarse voice with heavy prompting. Per spouse, he is very quiet most of the time at home. Following commands: Impaired Following commands impaired: Follows one step commands with increased time, Follows multi-step commands inconsistently    Cueing Cueing Techniques: Verbal cues, Tactile cues, Gestural cues  Exercises General Exercises  - Lower Extremity Long Arc Quad: AROM, Both, 10 reps, Seated, Strengthening Hip Flexion/Marching: AROM, Strengthening, Seated, Both, 10 reps Other Exercises Other Exercises: STS x 5 reps wtih HHA from chair using arm rests, needing >60 seconds Other Exercises: standing hip flexion x5 reps with HHA and x5 reps with RW    General Comments General comments (skin integrity, edema, etc.): symptomatic orthostatic hypotension, but pt with difficulty reporting symptoms other than fatigue, likely due to cog changes and dysarthria. SpO2 and HR WFL, just drop in BP observed, see above      Pertinent Vitals/Pain Pain Assessment Pain Assessment: PAINAD Faces Pain Scale: No hurt Breathing: normal Negative Vocalization: none Facial Expression: smiling or inexpressive Body Language: tense, distressed pacing, fidgeting Consolability: no need to console PAINAD Score: 1 Pain Intervention(s): Limited activity within patient's tolerance, Monitored during session, Repositioned    Home Living                          Prior Function            PT Goals (current goals can now be found in the care plan section) Acute Rehab PT Goals Patient Stated Goal: Per spouse, for him to get stronger and be more independent again PT Goal Formulation: Patient unable to participate in goal setting Time For  Goal Achievement: 11/07/24 Progress towards PT goals: Progressing toward goals    Frequency    Min 2X/week      PT Plan      Co-evaluation              AM-PAC PT 6 Clicks Mobility   Outcome Measure  Help needed turning from your back to your side while in a flat bed without using bedrails?: A Little Help needed moving from lying on your back to sitting on the side of a flat bed without using bedrails?: A Lot Help needed moving to and from a bed to a chair (including a wheelchair)?: A Little Help needed standing up from a chair using your arms (e.g., wheelchair or bedside chair)?: A  Lot Help needed to walk in hospital room?: Total (too fatigued today-[place]) Help needed climbing 3-5 steps with a railing? : Total 6 Click Score: 12    End of Session Equipment Utilized During Treatment: Gait belt Activity Tolerance: Patient tolerated treatment well;Patient limited by fatigue;Treatment limited secondary to medical complications (Comment);Other (comment) (orthostatic hypotension likely related to his c/o fatigue) Patient left: in chair;with call bell/phone within reach;with chair alarm set;with family/visitor present (spouse and sister in room) Nurse Communication: Mobility status;Precautions;Other (comment) (symptomatic drop in BP with sitting/standing; may benefit from TED hose (MD also notified per protocol)) PT Visit Diagnosis: Other abnormalities of gait and mobility (R26.89);Muscle weakness (generalized) (M62.81);Other symptoms and signs involving the nervous system (R29.898);Difficulty in walking, not elsewhere classified (R26.2)     Time: 8969-8951 PT Time Calculation (min) (ACUTE ONLY): 18 min  Charges:    $Therapeutic Exercise: 8-22 mins PT General Charges $$ ACUTE PT VISIT: 1 Visit                     Claudell Rhody P., PTA Acute Rehabilitation Services Secure Chat Preferred 9a-5:30pm Office: [phone]    Connell HERO [facility] 10/29/2024, 11:08 AM

## 2024-10-29 NOTE — Progress Notes (Signed)
 [name]  FMW:[id number] DOB: 11/13/1961 DOA: 10/17/2024 PCP: Dettinger, Fonda LABOR, MD    Brief Narrative:  63 year old with a history of osteoarthritis, basal cell carcinoma of the left arm, GERD, and prior SBO who presented to the [facility] ED 11/12 with 3 weeks of gradually progressive generalized weakness and a fall on 11/12.  In the ER the patient displayed significant somnolence with declining mental status.  MRI of the head revealed abnormal diffusion with FLAIR signal abnormality of the insula and left cortex inferior left frontal lobe and mesial left temporal lobe suggestive of acute encephalitis.  LP revealed CSF WBC of 60 with protein of 92 and glucose of 55 with meningitis panel positive for HSV 1.  Significant Events: 11/12 admit for AMS, generalized weakness, fall - LP indicating HSV1 encephalitis 11/14 worsening altered mental status with seizure activity, started on AEDs, then transferred to ICU for intubation as pt could not protect airway 11/18 extubated and placed on 3L [place], saturating well 11/20 TRH assumed care   Goals of Care:   Code Status: Full Code   DVT prophylaxis: enoxaparin  (LOVENOX ) injection 40 mg Start: 10/20/24 1545 SCDs Start: 10/17/24 2340   Interim Hx: Afebrile.  Vital signs stable.  No acute events recorded overnight.  Resting comfortably in a bedside chair.  No complaints.  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 ID recommends 3 weeks of IV acyclovir  therapy (through 11/07/2024) - decadron  as directed by Neurology [Days 1-2 (11/18-11/19): 4mg  IV q 8 hrs > Days 3-4: 4mg  IV q 12 hrs > Days 5-6: 2mg  IV q 12 hrs > Days 7-8: 2mg  IV daily > discontinue] - continue to work on mobility -has improved but remains confused and requires significant assistance/cues with ADLs  Seizures Due to above - continue Keppra  as per Neurology  Septic shock due to encephalitis Resolved  Acute hypoxic respiratory failure  due to seizures and encephalopathy Required intubation and mechanical ventilation 11/14-11/18 - now resolved  Steroid-induced hyperglycemia CBG very well-controlled at present  GERD Continue PPI   Family Communication: Spoke with wife in hallway Disposition: Is medically stable for disposition - awaiting placement in CIR Acute Inpatient Rehab (3hours/Day)10/26/2024 1159  Objective: Blood pressure 92/69, pulse 66, temperature 97.8 F (36.6 C), temperature source Axillary, resp. rate 15, height 5' 5 (1.651 m), weight 80.4 kg, SpO2 100%.  Intake/Output Summary (Last 24 hours) at 10/29/2024 0937 Last data filed at 10/29/2024 0226 Gross per 24 hour  Intake 480 ml  Output 2780 ml  Net -2300 ml   Filed Weights   10/22/24 0341 10/23/24 0419 10/24/24 0500  Weight: 80.6 kg 81.2 kg 80.4 kg    Examination: General: No acute respiratory distress Lungs: CTAB without wheezes or crackles Cardiovascular: RRR without murmur gallop or rub Abdomen: NT/ND, soft, BS positive, no rebound Extremities: No significant edema bilateral lower extremities  CBC: Recent Labs  Lab 10/26/24 0520  WBC 6.7  HGB 13.1  HCT 38.5*  MCV 95.1  PLT 264   Basic Metabolic Panel: Recent Labs  Lab 10/23/24 0637 10/24/24 0655 10/26/24 0520 10/29/24 0455  NA 136  --  137 137  K 4.1  --  3.8 3.8  CL 101  --  103 103  CO2 23  --  25 22  GLUCOSE 209*  --  92 89  BUN 17  --  22 25*  CREATININE 0.68  --  0.77 0.87  CALCIUM  8.8*  --  8.3*  8.3*  MG  --   --  2.3  --   PHOS 2.8 3.1  --   --    GFR: Estimated Creatinine Clearance: 87.1 mL/min (by C-G formula based on SCr of 0.87 mg/dL).   Scheduled Meds:  Chlorhexidine  Gluconate Cloth  6 each Topical Daily   dexamethasone  (DECADRON ) injection  2 mg Intravenous Q24H   enoxaparin  (LOVENOX ) injection  40 mg Subcutaneous Q24H   feeding supplement  237 mL Oral BID BM   levETIRAcetam   1,000 mg Oral BID   mirtazapine   7.5 mg Oral QHS   multivitamin with  minerals  1 tablet Oral Daily   nutrition supplement (JUVEN)  1 packet Oral BID BM   pantoprazole   40 mg Oral QHS   senna  1 tablet Oral Daily   sodium chloride  flush  10-40 mL Intracatheter Q12H   Continuous Infusions:  sodium chloride  50 mL/hr at 10/28/24 2343   acyclovir  800 mg (10/29/24 0541)     LOS: 12 days   Reyes IVAR Moores, MD [facility] Office  [phone] Pager - Text Page per Tracey  If 7PM-7AM, please contact night-coverage per Amion 10/29/2024, 9:37 AM

## 2024-10-29 NOTE — Progress Notes (Signed)
 Speech Language Pathology Treatment: Dysphagia  Patient Details Name: [name] MRN: [id number] DOB: 06/23/1961 Today's Date: 10/29/2024 Time: 8687-8668 SLP Time Calculation (min) (ACUTE ONLY): 19 min  Assessment / Plan / Recommendation Clinical Impression  Recommend continue current diet of Regular/thin liquids with use of swallowing/esophageal precautions posted in room and/or provided to family for safe, efficient swallowing during all po intake.  Medications continue whole in puree for safety until pt mentation improves fully during acute stay.  ST will continue to f/u in acute setting for dysphagia tx/education.  Pt required mod verbal cues to rouse for dysphagia tx session; but once roused, pt consumed puree/thin via cup/straw with one incidence of coughing noted with initial swallow of thin (likely d/t volume) with pt requiring mod verbal cues for bite/sip size with all consistencies.  Discussed swallowing/esophageal precautions with pt/family (wife, sister-in-law) and provided hand-out regarding esophageal component of swallow and need for precautions during meals/snacks.  Family in agreement.    HPI HPI: Mr. Staley is a 63 y/o gentleman admitted after a fall on 11/12. 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 ON 11/14, he began developing new seizure activity and was intubated until 11/18;MBS generated to assess swallow function after intermittent throat clearing/cough noted on BSE on 10/25/24. Pmhx significant for GERD.      SLP Plan  Goals updated          Recommendations  Diet recommendations: Regular;Thin liquid Liquids provided via: Cup;Straw Medication Administration: Whole meds with puree Supervision: Staff to assist with self feeding Compensations: Slow rate;Small sips/bites;Minimize environmental distractions;Multiple dry swallows after each bite/sip;Follow solids with liquid Postural Changes and/or Swallow Maneuvers: Seated upright 90 degrees;Upright 30-60 min after  meal                  Oral care BID   Intermittent Supervision/Assistance Dysphagia, unspecified (R13.10)     Goals updated     Pat Sherrina Zaugg,M.S.,CCC-SLP  10/29/2024, 1:44 PM

## 2024-10-29 NOTE — Progress Notes (Signed)
 Occupational Therapy Treatment Patient Details Name: [name] MRN: [id number] DOB: 07/19/1961 Today's Date: 10/29/2024   History of present illness 63 yo M adm 10/17/24 with weakness, fall, AMS, acute encephalopathy and sepsis. MRI with HSV encephalitis. 11/13 lumbar puncture. 11/14-11/18 VDRF. 11/14 seizure. PMhx: HLD, anxiety, hernia   OT comments  Pt initially asleep but aroused easily. Eager for hallway ambulation followed by seated grooming before and after eating breakfast. While Pt is showing improvements in cognition - he is far from baseline. He still uses nonsense words, attempts to gesture and limits vocal interaction. He required cues for problem solving, initiation - but fine motor and gross motor improving for functional tasks like self-feeding (cutting up food, assist opening containers) Pt continues to require min A for ambulation and transfers with RW and fatigues quickly demonstrating decreased activity tolerance. He will benefit from skilled OT in the acute and post-acute setting at >3 hour comprehensive rehab. Next session plan on formal cognitive assessment.      If plan is discharge home, recommend the following:  Assistance with cooking/housework;Assistance with feeding;Direct supervision/assist for medications management;Direct supervision/assist for financial management;Assist for transportation;Help with stairs or ramp for entrance;A lot of help with bathing/dressing/bathroom;A little help with walking and/or transfers   Equipment Recommendations  Other (comment) (defer to next venue)    Recommendations for Other Services Rehab consult    Precautions / Restrictions Precautions Precautions: Fall Recall of Precautions/Restrictions: Impaired Restrictions Weight Bearing Restrictions Per Provider Order: No       Mobility Bed Mobility Overal bed mobility: Needs Assistance Bed Mobility: Supine to Sit     Supine to sit: HOB elevated, Mod assist      General bed mobility comments: Pt reaches out for assist to elevated trunk, assist with bed pad to bring hips [name]    Transfers Overall transfer level: Needs assistance Equipment used: Rolling walker (2 wheels) Transfers: Sit to/from Stand Sit to Stand: Min assist           General transfer comment: minimal physical assistance to rise, multimodal assist for technique with RW     Balance Overall balance assessment: Needs assistance Sitting-balance support: No upper extremity supported Sitting balance-Leahy Scale: Fair Sitting balance - Comments: at edge of chair Postural control: Posterior lean Standing balance support: Bilateral upper extremity supported, During functional activity, Reliant on assistive device for balance Standing balance-Leahy Scale: Poor Standing balance comment: B UE support and min assist, fatigues quickly                           ADL either performed or assessed with clinical judgement   ADL Overall ADL's : Needs assistance/impaired Eating/Feeding: Sitting;Set up Eating/Feeding Details (indicate cue type and reason): some assist opening containers and problem solving how to access food. able to cut up his food with originial methods Grooming: Sitting;Wash/dry hands;Wash/dry face;Minimal assistance (shampoo cap) Grooming Details (indicate cue type and reason): decreased thoroughness, cues for sequencing     Lower Body Bathing: Maximal assistance;Total assistance;Cueing for safety;Cueing for compensatory techniques;Cueing for sequencing;Bed level;+2 for safety/equipment   Upper Body Dressing : Cueing for sequencing;Sitting;Minimal assistance Upper Body Dressing Details (indicate cue type and reason): extra gown like robe     Toilet Transfer: Minimal assistance;Cueing for safety;Ambulation;Rolling walker (2 wheels) Toilet Transfer Details (indicate cue type and reason): slow pace, requires min A for balance Toileting- Clothing Manipulation  and Hygiene: Total assistance;+2 for safety/equipment;Bed level Toileting - Clothing Manipulation Details (indicate  cue type and reason): currently with male purewick     Functional mobility during ADLs: Minimal assistance;Rolling walker (2 wheels)      Extremity/Trunk Assessment              Vision       Perception     Praxis     Communication Communication Communication: Impaired Factors Affecting Communication: Reduced clarity of speech;Other (comment) (low volume, frequently using gestures instead of attempting to vocalize)   Cognition Arousal: Lethargic Behavior During Therapy: Flat affect Cognition: Cognition impaired     Awareness: Intellectual awareness impaired, Online awareness impaired Memory impairment (select all impairments): Short-term memory, Working civil service fast streamer, Conservation officer, historic buildings Attention impairment (select first level of impairment): Selective attention Executive functioning impairment (select all impairments): Reasoning, Problem solving OT - Cognition Comments: Pt continues to limit verbal interaction, and some words are disjointed, nonsense words. Pt able to follow some 2 step commands today                 Following commands: Impaired Following commands impaired: Follows one step commands with increased time, Follows multi-step commands inconsistently      Cueing   Cueing Techniques: Verbal cues, Tactile cues, Gestural cues  Exercises      Shoulder Instructions       General Comments VSS on RA throughout session    Pertinent Vitals/ Pain       Pain Assessment Pain Assessment: Faces Faces Pain Scale: No hurt Pain Intervention(s): Monitored during session, Repositioned  Home Living                                          Prior Functioning/Environment              Frequency  Min 2X/week        Progress Toward Goals  OT Goals(current goals can now be found in the care plan section)  Progress  towards OT goals: Progressing toward goals  Acute Rehab OT Goals Patient Stated Goal: eat breakfast OT Goal Formulation: With patient Time For Goal Achievement: 11/07/24 Potential to Achieve Goals: Good ADL Goals Pt Will Perform Eating: with modified independence;sitting Pt Will Perform Grooming: with set-up;with supervision;sitting Pt Will Perform Upper Body Bathing: with contact guard assist;sitting Pt Will Perform Lower Body Dressing: with mod assist;sitting/lateral leans;sit to/from stand Pt Will Transfer to Toilet: with mod assist;ambulating;bedside commode Pt Will Perform Toileting - Clothing Manipulation and hygiene: with mod assist;sitting/lateral leans;sit to/from stand Additional ADL Goal #1: Patient will demonstrate ability to appropriately sequence a familiar 3-step functional or therapuetic task with Mod I in 4/5 opportunities.  Plan      Co-evaluation                 AM-PAC OT 6 Clicks Daily Activity     Outcome Measure   Help from another person eating meals?: A Little Help from another person taking care of personal grooming?: A Little Help from another person toileting, which includes using toliet, bedpan, or urinal?: A Lot Help from another person bathing (including washing, rinsing, drying)?: A Lot Help from another person to put on and taking off regular upper body clothing?: A Little Help from another person to put on and taking off regular lower body clothing?: A Lot 6 Click Score: 15    End of Session Equipment Utilized During Treatment: Gait belt;Rolling walker (2 wheels)  OT  Visit Diagnosis: Unsteadiness on feet (R26.81);Other abnormalities of gait and mobility (R26.89);Muscle weakness (generalized) (M62.81);Other symptoms and signs involving cognitive function   Activity Tolerance Patient tolerated treatment well   Patient Left in chair;with call bell/phone within reach;with chair alarm set   Nurse Communication Mobility status         Time: 9171-9143 OT Time Calculation (min): 28 min  Charges: OT General Charges $OT Visit: 1 Visit OT Treatments $Self Care/Home Management : 23-37 mins  Leita DEL OTR/L Acute Rehabilitation Services Office: [phone]   Leita PARAS [facility] 10/29/2024, 9:06 AM

## 2024-10-30 ENCOUNTER — Inpatient Hospital Stay (HOSPITAL_COMMUNITY)
Admission: AD | Admit: 2024-10-30 | Discharge: 2024-11-15 | DRG: 945 | Disposition: A | Discharge status: Home / Self Care | Source: Intra-hospital | Attending: Physical Medicine & Rehabilitation | Admitting: Physical Medicine & Rehabilitation

## 2024-10-30 ENCOUNTER — Encounter (HOSPITAL_COMMUNITY): Payer: Self-pay | Admitting: Physical Medicine & Rehabilitation

## 2024-10-30 ENCOUNTER — Encounter (HOSPITAL_COMMUNITY): Payer: Self-pay | Admitting: Pulmonary Disease

## 2024-10-30 ENCOUNTER — Other Ambulatory Visit: Payer: Self-pay

## 2024-10-30 DIAGNOSIS — B004 Herpesviral encephalitis: Secondary | ICD-10-CM | POA: Diagnosis not present

## 2024-10-30 DIAGNOSIS — F32A Depression, unspecified: Secondary | ICD-10-CM

## 2024-10-30 DIAGNOSIS — G936 Cerebral edema: Secondary | ICD-10-CM | POA: Diagnosis not present

## 2024-10-30 DIAGNOSIS — R569 Unspecified convulsions: Secondary | ICD-10-CM

## 2024-10-30 DIAGNOSIS — R739 Hyperglycemia, unspecified: Secondary | ICD-10-CM | POA: Diagnosis not present

## 2024-10-30 DIAGNOSIS — K5900 Constipation, unspecified: Secondary | ICD-10-CM | POA: Diagnosis not present

## 2024-10-30 DIAGNOSIS — K219 Gastro-esophageal reflux disease without esophagitis: Secondary | ICD-10-CM

## 2024-10-30 DIAGNOSIS — R5381 Other malaise: Secondary | ICD-10-CM | POA: Diagnosis not present

## 2024-10-30 MED ORDER — FLEET ENEMA RE ENEM: 1.0000 | ENEMA | Freq: Once | RECTAL | Status: DC | PRN

## 2024-10-30 MED ORDER — MIRTAZAPINE 15 MG PO TABS
7.5000 mg | ORAL_TABLET | Freq: Every day | ORAL | Status: DC
  Administered 2024-10-30 – 2024-11-09 (×11): 7.5 mg via ORAL
  Filled 2024-10-30 (×11): qty 1

## 2024-10-30 MED ORDER — POLYETHYLENE GLYCOL 3350 17 G PO PACK: 17.0000 g | PACK | Freq: Every day | ORAL | Status: DC | PRN

## 2024-10-30 MED ORDER — SODIUM CHLORIDE 0.9% FLUSH: 10.0000 mL | INTRAVENOUS | Status: DC | PRN

## 2024-10-30 MED ORDER — DEXTROSE 5 % IV SOLN
800.0000 mg | Freq: Three times a day (TID) | INTRAVENOUS | Status: AC
  Administered 2024-10-30 – 2024-11-07 (×24): 800 mg via INTRAVENOUS
  Filled 2024-10-30 (×24): qty 16

## 2024-10-30 MED ORDER — ALUM & MAG HYDROXIDE-SIMETH 200-200-20 MG/5ML PO SUSP: 30.0000 mL | ORAL | Status: DC | PRN

## 2024-10-30 MED ORDER — DIPHENHYDRAMINE HCL 25 MG PO CAPS
25.0000 mg | ORAL_CAPSULE | Freq: Four times a day (QID) | ORAL | Status: DC | PRN
  Administered 2024-11-05: 25 mg via ORAL
  Filled 2024-10-30: qty 1

## 2024-10-30 MED ORDER — CHLORHEXIDINE GLUCONATE CLOTH 2 % EX PADS
6.0000 | MEDICATED_PAD | Freq: Every day | CUTANEOUS | Status: DC
  Administered 2024-10-31 – 2024-11-02 (×3): 6 via TOPICAL

## 2024-10-30 MED ORDER — SODIUM CHLORIDE 0.9% FLUSH
10.0000 mL | Freq: Two times a day (BID) | INTRAVENOUS | Status: DC
  Administered 2024-10-30 – 2024-11-03 (×8): 10 mL
  Administered 2024-11-05: 20 mL
  Administered 2024-11-06 – 2024-11-07 (×2): 10 mL
  Administered 2024-11-08: 20 mL
  Administered 2024-11-08 – 2024-11-09 (×2): 10 mL
  Administered 2024-11-09: 20 mL
  Administered 2024-11-10 – 2024-11-12 (×5): 10 mL

## 2024-10-30 MED ORDER — LEVETIRACETAM 500 MG PO TABS
1000.0000 mg | ORAL_TABLET | Freq: Two times a day (BID) | ORAL | Status: DC
  Administered 2024-10-30 – 2024-11-15 (×32): 1000 mg via ORAL
  Filled 2024-10-30 (×32): qty 2

## 2024-10-30 MED ORDER — SENNA 8.6 MG PO TABS
1.0000 | ORAL_TABLET | Freq: Every day | ORAL | Status: DC
  Administered 2024-10-31 – 2024-11-15 (×15): 8.6 mg via ORAL
  Filled 2024-10-30 (×16): qty 1

## 2024-10-30 MED ORDER — PANTOPRAZOLE SODIUM 40 MG PO TBEC
40.0000 mg | DELAYED_RELEASE_TABLET | Freq: Every day | ORAL | Status: DC
  Administered 2024-10-30 – 2024-11-14 (×16): 40 mg via ORAL
  Filled 2024-10-30 (×16): qty 1

## 2024-10-30 MED ORDER — ENOXAPARIN SODIUM 40 MG/0.4ML IJ SOSY
40.0000 mg | PREFILLED_SYRINGE | Freq: Every day | INTRAMUSCULAR | Status: DC
  Administered 2024-10-31 – 2024-11-15 (×16): 40 mg via SUBCUTANEOUS
  Filled 2024-10-30 (×16): qty 0.4

## 2024-10-30 MED ORDER — BISACODYL 10 MG RE SUPP: 10.0000 mg | Freq: Every day | RECTAL | Status: DC | PRN

## 2024-10-30 MED ORDER — PROCHLORPERAZINE 25 MG RE SUPP: 12.5000 mg | Freq: Four times a day (QID) | RECTAL | Status: DC | PRN

## 2024-10-30 MED ORDER — BUDESONIDE 0.25 MG/2ML IN SUSP: 0.2500 mg | Freq: Two times a day (BID) | RESPIRATORY_TRACT | Status: DC | PRN

## 2024-10-30 MED ORDER — ALBUTEROL SULFATE (2.5 MG/3ML) 0.083% IN NEBU: 2.5000 mg | INHALATION_SOLUTION | Freq: Four times a day (QID) | RESPIRATORY_TRACT | Status: DC | PRN

## 2024-10-30 MED ORDER — ACETAMINOPHEN 325 MG PO TABS: 325.0000 mg | ORAL_TABLET | ORAL | Status: DC | PRN

## 2024-10-30 MED ORDER — ALBUTEROL-BUDESONIDE 90-80 MCG/ACT IN AERO: 2.0000 | INHALATION_SPRAY | RESPIRATORY_TRACT | Status: DC | PRN

## 2024-10-30 MED ORDER — MELATONIN 5 MG PO TABS
5.0000 mg | ORAL_TABLET | Freq: Every evening | ORAL | Status: DC | PRN
  Administered 2024-11-10 – 2024-11-11 (×2): 5 mg via ORAL
  Filled 2024-10-30 (×2): qty 1

## 2024-10-30 MED ORDER — PROCHLORPERAZINE EDISYLATE 10 MG/2ML IJ SOLN: 5.0000 mg | Freq: Four times a day (QID) | INTRAMUSCULAR | Status: DC | PRN

## 2024-10-30 MED ORDER — ADULT MULTIVITAMIN W/MINERALS CH
1.0000 | ORAL_TABLET | Freq: Every day | ORAL | Status: DC
  Administered 2024-10-31 – 2024-11-15 (×16): 1 via ORAL
  Filled 2024-10-30 (×16): qty 1

## 2024-10-30 MED ORDER — ORAL CARE MOUTH RINSE: 15.0000 mL | OROMUCOSAL | Status: DC | PRN

## 2024-10-30 MED ORDER — DEXAMETHASONE SODIUM PHOSPHATE 4 MG/ML IJ SOLN
2.0000 mg | INTRAMUSCULAR | Status: AC
  Administered 2024-10-30: 2 mg via INTRAVENOUS
  Filled 2024-10-30: qty 1

## 2024-10-30 MED ORDER — JUVEN PO PACK
1.0000 | PACK | Freq: Two times a day (BID) | ORAL | Status: DC
  Administered 2024-10-31 – 2024-11-15 (×27): 1 via ORAL
  Filled 2024-10-30 (×27): qty 1

## 2024-10-30 MED ORDER — PROCHLORPERAZINE MALEATE 5 MG PO TABS: 5.0000 mg | ORAL_TABLET | Freq: Four times a day (QID) | ORAL | Status: DC | PRN

## 2024-10-30 MED ORDER — SODIUM CHLORIDE 0.9 % IV SOLN: INTRAVENOUS | Status: DC

## 2024-10-30 MED ORDER — CALCIUM CITRATE 950 (200 CA) MG PO TABS
200.0000 mg | ORAL_TABLET | Freq: Every day | ORAL | Status: DC
  Administered 2024-10-31 – 2024-11-14 (×15): 950 mg via ORAL
  Filled 2024-10-30 (×15): qty 1

## 2024-10-30 MED ORDER — GUAIFENESIN-DM 100-10 MG/5ML PO SYRP: 5.0000 mL | ORAL_SOLUTION | Freq: Four times a day (QID) | ORAL | Status: DC | PRN

## 2024-10-30 NOTE — TOC Transition Note (Signed)
 Transition of Care (TOC) - Discharge Note Rayfield Gobble RN, BSN Inpatient Care Management Unit 4E- RN Case Manager See Treatment Team for direct phone #   Patient Details  Name: [name] MRN: [id number] Date of Birth: [birth year]  Transition of Care [facility]) CM/SW Contact:  Gobble Rayfield Hurst, RN Phone Number: 10/30/2024, 2:58 PM   Clinical Narrative:    Notified by [facility] rehab liaison that pt has received insurance approval for rehab and has bed available for admit today.   Pt has been cleared by MD for transition to [facility] re[facility], unit staff will transport pt to [facility] rehab once bed has been assigned.   No further CM needs noted.    Final next level of care: IP Rehab Facility Barriers to Discharge: Barriers Resolved   Patient Goals and CMS Choice Patient states their goals for this hospitalization and ongoing recovery are:: wants to get stronger and return home   Choice offered to / list presented to : Patient      Discharge Placement               [facility]        Discharge Plan and Services Additional resources added to the After Visit Summary for   In-house Referral: Clinical Social Work Discharge Planning Services: CM Consult Post Acute Care Choice: IP Rehab          DME Arranged: N/A DME Agency: NA       HH Arranged: NA HH Agency: NA        Social Drivers of Health (SDOH) Interventions SDOH Screenings   Food Insecurity: No Food Insecurity (10/17/2024)  Housing: Low Risk  (10/17/2024)  Transportation Needs: No Transportation Needs (10/17/2024)  Utilities: Not At Risk (10/17/2024)  Alcohol Screen: Low Risk  (09/27/2024)  Depression (PHQ2-9): Low Risk  (06/13/2024)  Financial Resource Strain: Low Risk  (09/27/2024)  Physical Activity: Insufficiently Active (09/27/2024)  Social Connections: Moderately Isolated (09/27/2024)  Stress: No Stress Concern Present (09/27/2024)  Tobacco Use: Low Risk  (10/17/2024)     Readmission  Risk Interventions    10/30/2024    2:58 PM  Readmission Risk Prevention Plan  Transportation Screening Complete  Home Care Screening Complete  Medication Review (RN CM) Complete

## 2024-10-30 NOTE — Discharge Summary (Signed)
 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 In the ER the patient displayed significant somnolence with declining mental status. MRI of the head revealed abnormal diffusion with FLAIR signal abnormality of the insula and left cortex inferior left frontal lobe and mesial left temporal lobe suggestive of acute encephalitis. LP revealed CSF WBC of 60 with protein of 92 and glucose of 55 with meningitis panel positive for HSV 1.   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 ID recommends 3 weeks of IV acyclovir  therapy (through 11/07/2024) - decadron  as directed by Neurology [Days 1-2 (11/18-11/19): 4mg  IV q 8 hrs > Days 3-4: 4mg  IV q 12 hrs > Days 5-6: 2mg  IV q 12 hrs > Days 7-8: 2mg  IV daily > discontinue] - continue to work on mobility - has improved but remains confused and requires significant assistance/cues with ADLs -of note the patient will need to remain on supplemental  IV fluid as long as he is receiving IV acyclovir  and will need to have a basic metabolic panel checked every 3 days to monitor his renal function   Seizures Due to above - continue Keppra  as per Neurology   Septic shock due to encephalitis Resolved   Acute hypoxic respiratory failure due to seizures and encephalopathy Required intubation and mechanical ventilation 11/14-11/18 - now resolved   Steroid-induced hyperglycemia CBG very well-controlled at present   GERD Continue PPI  No current facility-administered medications for this encounter. No current outpatient medications on file.  Facility-Administered Medications Ordered in Other Encounters:    0.9 %  sodium chloride  infusion, , Intravenous, Continuous, Love, Pamela S, PA-C   acetaminophen  (TYLENOL ) tablet 325-650 mg, 325-650 mg, Oral, Q4H PRN, Love, Pamela S, PA-C   acyclovir  (ZOVIRAX ) 800 mg in dextrose  5 % 250 mL IVPB, 800 mg, Intravenous, Q8H, Love, Pamela S, PA-C   Albuterol -Budesonide  90-80 MCG/ACT AERO 2 puff, 2 puff, Inhalation, PRN, Love, Pamela S, PA-C   alum & mag hydroxide-simeth (MAALOX/MYLANTA) 200-200-20 MG/5ML suspension 30 mL, 30 mL, Oral, Q4H PRN, Love, Pamela S, PA-C   bisacodyl  (DULCOLAX) suppository 10 mg, 10 mg, Rectal, Daily PRN, Love, Pamela S, PA-C   [START ON 10/31/2024] calcium  citrate (CALCITRATE - dosed in mg elemental calcium ) tablet 950 mg, 200 mg of elemental calcium , Oral, Q lunch, Love, Pamela S, PA-C   [START ON 10/31/2024] Chlorhexidine  Gluconate Cloth 2 % PADS 6 each, 6 each, Topical, Daily, Love, Pamela S, PA-C   dexamethasone  (DECADRON ) injection 2 mg, 2 mg, Intravenous, Q24H, Love, Pamela S, PA-C   diphenhydrAMINE  (BENADRYL ) capsule 25 mg, 25 mg, Oral, Q6H PRN, Love,  Sharlet RAMAN, PA-C   enoxaparin  (LOVENOX ) injection 40 mg, 40 mg, Subcutaneous, Q24H, Love, Pamela S, PA-C   guaiFENesin -dextromethorphan (ROBITUSSIN DM) 100-10 MG/5ML syrup 5-10 mL, 5-10 mL, Oral, Q6H PRN, Love, Pamela S, PA-C    levETIRAcetam  (KEPPRA ) tablet 1,000 mg, 1,000 mg, Oral, BID, Love, Pamela S, PA-C   melatonin tablet 5 mg, 5 mg, Oral, QHS PRN, Love, Pamela S, PA-C   mirtazapine  (REMERON ) tablet 7.5 mg, 7.5 mg, Oral, QHS, Love, Pamela S, PA-C   [START ON 10/31/2024] multivitamin with minerals tablet 1 tablet, 1 tablet, Oral, Daily, Love, Pamela S, PA-C   [START ON 10/31/2024] nutrition supplement (JUVEN) (JUVEN) powder packet 1 packet, 1 packet, Oral, BID BM, Love, Pamela S, PA-C   Oral care mouth rinse, 15 mL, Mouth Rinse, PRN, Love, Pamela S, PA-C   pantoprazole  (PROTONIX ) EC tablet 40 mg, 40 mg, Oral, QHS, Love, Pamela S, PA-C   polyethylene glycol (MIRALAX  / GLYCOLAX ) packet 17 g, 17 g, Oral, Daily PRN, Love, Pamela S, PA-C   prochlorperazine  (COMPAZINE ) tablet 5-10 mg, 5-10 mg, Oral, Q6H PRN **OR** prochlorperazine  (COMPAZINE ) suppository 12.5 mg, 12.5 mg, Rectal, Q6H PRN **OR** prochlorperazine  (COMPAZINE ) injection 5-10 mg, 5-10 mg, Intravenous, Q6H PRN, Love, Pamela S, PA-C   [START ON 10/31/2024] senna (SENOKOT) tablet 8.6 mg, 1 tablet, Oral, Daily, Love, Pamela S, PA-C   sodium chloride  flush (NS) 0.9 % injection 10-40 mL, 10-40 mL, Intracatheter, Q12H, Love, Pamela S, PA-C   sodium chloride  flush (NS) 0.9 % injection 10-40 mL, 10-40 mL, Intracatheter, PRN, Love, Pamela S, PA-C   sodium phosphate  (FLEET) enema 1 enema, 1 enema, Rectal, Once PRN, Love, Pamela S, PA-C    Day of Discharge BP 98/63 (BP Location: Right Arm)   Pulse 62   Temp 98.2 F (36.8 C) (Oral)   Resp 17   Ht 5' 5 (1.651 m)   Wt 80.4 kg   SpO2 95%   BMI 29.50 kg/m   Physical Exam: General: No acute respiratory distress Lungs: Clear to auscultation bilaterally without wheezes or crackles Cardiovascular: Regular rate and rhythm without murmur gallop or rub normal S1 and S2 Abdomen: Nontender, nondistended, soft, bowel sounds positive, no rebound, no ascites, no appreciable mass Extremities: No significant cyanosis, clubbing,  or edema bilateral lower extremities  Basic Metabolic Panel: Recent Labs  Lab 10/24/24 0655 10/26/24 0520 10/29/24 0455  NA  --  137 137  K  --  3.8 3.8  CL  --  103 103  CO2  --  25 22  GLUCOSE  --  92 89  BUN  --  22 25*  CREATININE  --  0.77 0.87  CALCIUM   --  8.3* 8.3*  MG  --  2.3  --   PHOS 3.1  --   --     CBC: Recent Labs  Lab 10/26/24 0520  WBC 6.7  HGB 13.1  HCT 38.5*  MCV 95.1  PLT 264    Time spent in discharge (includes decision making & examination of pt): 35 minutes  10/30/2024, 5:53 PM   Reyes IVAR Moores, MD [facility] Office  [phone]

## 2024-10-30 NOTE — Progress Notes (Signed)
 IP rehab admissions - We have approval for inpatient rehab admission.  Bed available and will admit to CIR today.  [phone]

## 2024-10-30 NOTE — H&P (Signed)
 Physical Medicine and Rehabilitation Admission H&P    Chief Complaint  Patient presents with   Functional decline due to HSV encephalitis    HPI: [name] is a 63 year old male with  history of GERD, Left hip AVN, DDD lumbar spine, 3 week history of cough, congestion and HA who was started on doxycycline  on 11/07 without improvement and was admitted on 10/17/24 with mental status changes with confusion and difficulty talking. He was noted to be septic and was somnolent, febrile with T- 102.2, hyponatremic, had AKI with lactic acidosis. 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 LP done revealing WBC 60, elevated protein @ 92. He was started on IVF, broad spectrum antibiotics as well as acyclovir  and decadron . He was loaded with Keppra  for seizure prophylaxis. CTA chest done which was negative for PE. EEG done which showed encephalopathy with seizures or epileptiform discharges. Meningoencephalitis panel positive for HSV and antibiotics d/c with recommendations to continue Acyclovir    Hospital course significant for decline in LOC concerning for seizure and was intubated for airway protection, loaded with Keppra  and placed on LT-EEG revealing evidence of epileptogenicity with cortical dysfunction arising form left hemisphere. He developed right  sided weakness on 11/16 and CT head done revealing marked progression of edema in mesial left temporal lobe and insula extending to Left. Right lobes c/w progressive herpes encephalitis as well as suspicion of non-mass occupying acute hemorrhage. He was started on IV decadron  and Keppra  increased to 1 gram BID due to continued irritability noted on EEG. Neurology considering continuing Keppra  indefinitely for now.  Dr. Overton recommended 3 week course or Acyclovir  with 7-10 day course of steroids as well as 5 day  course of antibiotics for CAP/HAP which was completed on 11/19. Elevated BS due to tube feeds have resolved--Hgb A1c-5.6.  He tolerated extubation by 11/18 and started on regular diet as mentation was improving   He continues to have dysphonia, has delayed processing with difficulty sequencing, has decreased judgement/awareness, tends to fatigue easily and requires min to to mod assist for standing and mod to max assist to march in place with balance deficits and min to max assist with ADL tasks. He was independent PTA and CIR recommended due to functional decline.      Review of Systems  Constitutional:  Negative for fever and malaise/fatigue.  HENT:  Negative for hearing loss.   Eyes:        Reports that vision is poor  Respiratory:  Negative for shortness of breath.   Cardiovascular:  Negative for chest pain.  Gastrointestinal:  Negative for abdominal pain.  Genitourinary: Negative.   Musculoskeletal:  Negative for back pain and myalgias.  Skin:  Negative for rash.  Neurological:  Positive for weakness. Negative for headaches.  Psychiatric/Behavioral:  Positive for memory loss. The patient does not have insomnia.      Past Medical History:  Diagnosis Date   Arthritis    Arthritis of lumbar spine    AVN (avascular necrosis of bone) (HCC)    Left hip   Calcaneus fracture, left    Cancer (HCC)    Skin cancer Left Arm   GERD (gastroesophageal reflux disease)    Small bowel obstruction (HCC)     Past Surgical History:  Procedure Laterality Date   FOOT SURGERY     HAND SURGERY Right    contracture  of hand   INGUINAL HERNIA REPAIR Right 11/23/2013   Procedure: HERNIA REPAIR INGUINAL ADULT;  Surgeon: Elsie GORMAN Holland, MD;  Location: [facility];  Service: General;  Laterality: Right;  site-inguinal area   OPEN REDUCTION, INTERNAL FIXATION (ORIF) CALCANEAL FRACTURE WITH FUSION Left 11/30/2018   Procedure: OPEN REDUCTION, INTERNAL FIXATION (ORIF) CALCANEAL FRACTURE WITH PERONEAL  DEBRIDEMENT & REPAIR OF SUPERIOR PERONEAL RETINACULUM;  Surgeon: Elsa Lonni SAUNDERS, MD;  Location: [facility];  Service: Orthopedics;  Laterality: Left;   VASECTOMY     WRIST SURGERY Right    otif    Family History  Problem Relation Age of Onset   Stroke Mother    Cancer Mother        colon   Alzheimer's disease Father        48 years   Heart disease Father        atrial fibri   Heart disease Sister    Early death Brother    Cancer Maternal Grandmother     Social History:  reports that he has never smoked. He has never used smokeless tobacco. He reports current alcohol use of about 4.0 standard drinks of alcohol per week. He reports that he does not use drugs.   Allergies  Allergen Reactions   Bee Venom Rash   Morphine And Codeine Swelling    Medications Prior to Admission  Medication Sig Dispense Refill   Albuterol -Budesonide  (AIRSUPRA ) 90-80 MCG/ACT AERO Inhale 2 puffs into the lungs as needed (wheezing, shortness of breath). Max of 12 puffs per 24 hours 10.7 g 3   [EXPIRED] doxycycline  (VIBRA -TABS) 100 MG tablet Take 1 tablet (100 mg total) by mouth 2 (two) times daily for 7 days. 1 po bid 14 tablet 0   esomeprazole (NEXIUM) 20 MG capsule Take 20 mg by mouth daily at 12 noon.     famotidine  (PEPCID ) 20 MG tablet Take 1 tablet (20 mg total) by mouth 2 (two) times daily as needed for heartburn or indigestion (breakthrough not controlled by Protonix ). 180 tablet 3   fluticasone  (FLONASE ) 50 MCG/ACT nasal spray Place 2 sprays into both nostrils daily. 16 g 6   mirtazapine  (REMERON ) 7.5 MG tablet Take 1 tablet (7.5 mg total) by mouth at bedtime. For anxiety/ sleep 90 tablet 3   ondansetron  (ZOFRAN ) 4 MG tablet Take 1 tablet (4 mg total) by mouth every 8 (eight) hours as needed for nausea or vomiting. 20 tablet 0   sertraline  (ZOLOFT ) 50 MG tablet Take 1 tablet (50 mg total) by mouth daily. 90 tablet 3      Home: Home Living Family/patient expects to be discharged to:: Private  residence Living Arrangements: Spouse/significant other (wife) Available Help at Discharge: Family, Available 24 hours/day Type of Home: Mobile home Home Access: Ramped entrance Home Layout: One level Bathroom Shower/Tub: Tub/shower unit, Health Visitor: Standard Home Equipment: Agricultural Consultant (2 wheels), BSC/3in1, Medical Laboratory Scientific Officer - single point Additional Comments: PLOF and home setup provided by niece during session as pt not a reliable reporter and providing conflicting information   Functional History: Prior Function Prior Level of Function : Independent/Modified Independent, Driving, Working/employed Mobility Comments: At baseline, pt is Ind for mobility without an AD. Report ADLs Comments: At baseline, pt is Independent with ADLs and ADLs. Pt drives and works in barista.  Functional Status:  Mobility: Bed Mobility Overal bed mobility: Needs Assistance Bed Mobility: Supine to Sit Rolling: Min assist Supine to sit: HOB elevated, Mod assist General bed mobility comments:  pt received in chair Transfers Overall transfer level: Needs assistance Equipment used: Rolling walker (2 wheels), None Transfers: Sit to/from Stand Sit to Stand: Min assist, Mod assist General transfer comment: from chair with no AD and modA. From chair wtih RW and minA, decreased eccentric control to sit as he fatigues and pt does not recall to reach back for arm rests and delayed processing when cued to reach back; BP checked due to pt symptoms and pt found to be orthostatic. Pt performs reciprocal STS x 5 with HHA and modA, and needing minA to stand x2 reps to RW from chair, cues needed to initiate. Ambulation/Gait Ambulation/Gait assistance: Mod assist, Max assist Gait Distance (Feet): 128 Feet Assistive device: None, 1 person hand held assist Gait Pattern/deviations: Step-through pattern, Decreased stride length, Narrow base of support General Gait Details: see pre-gait  activities, pt orthostatic and c/o 10/10 modified RPE post-standing and exercises, so defer gait trial away from chair. Gait velocity interpretation: <1.8 ft/sec, indicate of risk for recurrent falls Pre-gait activities: standing hip flexion with HHA/no AD and gait belt support, ~4 weight shifts L/R with minimal hip/knee flexion when pt cued to march with HHA, pt reaching for RW as it was brought to him prior to performing 5th rep, then able to perform x5 more marches, then when PTA asked if he was fatigued, pt agrees, and sits. BP soft standing with MAP (60) and improved seated/supine and RN/MD notified.    ADL: ADL Overall ADL's : Needs assistance/impaired Eating/Feeding: Sitting, Set up Eating/Feeding Details (indicate cue type and reason): some assist opening containers and problem solving how to access food. able to cut up his food with originial methods Grooming: Sitting, Wash/dry hands, Wash/dry face, Minimal assistance (shampoo cap) Grooming Details (indicate cue type and reason): decreased thoroughness, cues for sequencing Upper Body Bathing: Moderate assistance, Cueing for sequencing, Cueing for compensatory techniques, Sitting Lower Body Bathing: Maximal assistance, Total assistance, Cueing for safety, Cueing for compensatory techniques, Cueing for sequencing, Bed level, +2 for safety/equipment Upper Body Dressing : Cueing for sequencing, Sitting, Minimal assistance Upper Body Dressing Details (indicate cue type and reason): extra gown like robe Lower Body Dressing: Total assistance, Bed level, Cueing for sequencing, Cueing for compensatory techniques, Cueing for safety, +2 for safety/equipment Toilet Transfer: Minimal assistance, Cueing for safety, Ambulation, Rolling walker (2 wheels) Toilet Transfer Details (indicate cue type and reason): slow pace, requires min A for balance Toileting- Clothing Manipulation and Hygiene: Total assistance, +2 for safety/equipment, Bed  level Toileting - Clothing Manipulation Details (indicate cue type and reason): currently with male purewick Functional mobility during ADLs: Minimal assistance, Rolling walker (2 wheels)  Cognition: Cognition Orientation Level: Oriented to person, Oriented to place, Disoriented to situation, Oriented to time Cognition Arousal: Alert Behavior During Therapy: Flat affect  Physical Exam: Blood pressure 98/63, pulse 62, temperature 98.2 F (36.8 C), temperature source Oral, resp. rate 17, height 5' 5 (1.651 m), weight 80.4 kg, SpO2 95%.   General: No apparent distress HEENT: Head is normocephalic, atraumatic, sclera anicteric, oral mucosa pink and moist, wearing glasses Neck: Supple without JVD or lymphadenopathy Heart: Reg rate and rhythm. No murmurs rubs or gallops Chest: CTA bilaterally without wheezes, rales, or rhonchi; no distress Abdomen: Soft, non-tender, non-distended, bowel sounds positive. Extremities: No clubbing, cyanosis, or edema. Psych: Pt's affect is flat Skin: Clean and intact without signs of breakdown Neuro:    Mental Status: AAO to person, hospital, year and month not day, said [name] was president Speech/Languate: Naming and  repetition intact, fluent, follows simple commands, + hypophonia  Recalls 1/3 words 3 min later CRANIAL NERVES: II: PERRL. Visual fields full III, IV, VI: EOM intact, no gaze preference or deviation Mild L ptosis-able to open V: normal sensation bilaterally VII: no asymmetry VIII: normal hearing to speech IX, X: normal palatal elevation XI: intact b/l head turn XII: Tongue midline   MOTOR: RUE: 4+ throughout LUE:4+ throughout RLE: HF 4-/5, KE 4/5, ADF 4/5, APF 4/5 LLE: HF 4-/5, KE 4/5, ADF 4/5, APF 4/5   REFLEXES: hyporeflexive throughout, no ankle clonus   SENSORY: Normal to touch all 4 extremities  Coordination: Normal finger to nose  no tremor, no dysmetria  L picc line   Results for orders placed or performed  during the hospital encounter of 10/17/24 (from the past 48 hours)  Basic metabolic panel with GFR     Status: Abnormal   Collection Time: 10/29/24  4:55 AM  Result Value Ref Range   Sodium 137 135 - 145 mmol/L   Potassium 3.8 3.5 - 5.1 mmol/L   Chloride 103 98 - 111 mmol/L   CO2 22 22 - 32 mmol/L   Glucose, Bld 89 70 - 99 mg/dL    Comment: Glucose reference range applies only to samples taken after fasting for at least 8 hours.   BUN 25 (H) 8 - 23 mg/dL   Creatinine, Ser 9.12 0.61 - 1.24 mg/dL   Calcium  8.3 (L) 8.9 - 10.3 mg/dL   GFR, Estimated >39 >39 mL/min    Comment: (NOTE) Calculated using the CKD-EPI Creatinine Equation (2021)    Anion gap 12 5 - 15    Comment: Performed at [facility], [address] [place], [place] [address]   No results found.    Blood pressure 98/63, pulse 62, temperature 98.2 F (36.8 C), temperature source Oral, resp. rate 17, height 5' 5 (1.651 m), weight 80.4 kg, SpO2 95%.  Medical Problem List and Plan: 1. Functional deficits secondary to HSV encephalitis with cerebral edema  -patient may shower, if picc covered  -ELOS/Goals: 7-10 days, PT/OT/SLP mod I to sup  -admit to CIR 2.  Antithrombotics: -DVT/anticoagulation:  Pharmaceutical: Lovenox   -antiplatelet therapy:  3. Pain Management: tylenol  prn 4. Mood/Behavior/Sleep: LCSW to follow for evaluation and support.  --Melatonin prn for insomnia  -antipsychotic agents: N/A 5. Neuropsych/cognition: This patient is not fully capable of making decisions on his own behalf. 6. Skin/Wound Care: Routine pressure relief  measures.  7. Fluids/Electrolytes/Nutrition: Monitor I/O. Check CMET in am. Monitor renal status closely 8.  Herpes encephalitis: Continue IV acyclovir  thru 11/07/24  --IVF for hydration while on ayclovir to prevent renal injury.  --on decadron  to be tapered off tomorrow.  9. Seizures: Continue Keppra  1000 mg bid 10. Pre-renal azotemia: Encourage fluid intake--may need to  increase IVF  -labs tomorrow  11. H/o Depression: was on Zoloft  50 mg w/Remeron  7.5 mg daily  12.  Hypocalcemia: Boderline low with ionized Calcium  1.14. Caltrate added.  13.  Steroid-induced hyperglycemia:  -Check on labs tomorrow  14. GERD: continue PPI   Pamela S Love, PA-C 10/30/2024  I have personally performed a face to face diagnostic evaluation of this patient and formulated the key components of the plan.  Additionally, I have personally reviewed laboratory data, imaging studies, as well as relevant notes and concur with the physician assistant's documentation above.  The patient's status has not changed from the original H&P.  Any changes in documentation from the acute care chart  have been noted above.  Murray Collier, MD

## 2024-10-30 NOTE — H&P (Signed)
 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 He was noted to be septic and was somnolent, febrile with T- 102.2, hyponatremic, had AKI with lactic acidosis. 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 LP done revealing WBC 60, elevated protein @ 92. He was started on IVF, broad spectrum antibiotics as well as acyclovir  and decadron . He was loaded with Keppra  for seizure prophylaxis. CTA chest done which was negative for PE. EEG done which showed encephalopathy with seizures or epileptiform discharges. Meningoencephalitis panel positive for HSV and antibiotics d/c with recommendations to continue Acyclovir     Hospital course significant for decline in LOC concerning for seizure and was intubated for airway protection, loaded with Keppra  and placed on LT-EEG revealing evidence of epileptogenicity with cortical dysfunction arising form left hemisphere. He developed right  sided weakness on 11/16 and CT head done revealing marked progression of edema in mesial left temporal lobe and insula extending to Left. Right lobes c/w progressive herpes encephalitis as well as suspicion of non-mass occupying acute hemorrhage. He was started on IV decadron  and Keppra  increased to 1 gram BID due to continued irritability noted on EEG. Neurology considering continuing Keppra  indefinitely for now.  Dr. Overton recommended 3 week course or Acyclovir  with 7-10 day course of steroids as well  as 5 day course of antibiotics for CAP/HAP which was completed on 11/19. Elevated BS due to tube feeds have resolved--Hgb A1c-5.6.  He tolerated extubation by 11/18 and started on regular diet as mentation was improving    He continues to have dysphonia, has delayed processing with difficulty sequencing, has decreased judgement/awareness, tends to fatigue easily and requires min to to mod assist for standing and mod to max assist to march in place with balance deficits and min to max assist with ADL tasks. He was independent PTA and CIR recommended due to functional decline.       Review of Systems  Constitutional:  Negative for fever and malaise/fatigue.  HENT:  Negative for hearing loss.   Eyes:        Reports that vision is poor  Respiratory:  Negative for shortness of breath.   Cardiovascular:  Negative for chest pain.  Gastrointestinal:  Negative for abdominal pain.  Genitourinary: Negative.   Musculoskeletal:  Negative for back pain and myalgias.  Skin:  Negative for rash.  Neurological:  Positive for weakness. Negative for headaches.  Psychiatric/Behavioral:  Positive for memory loss. The patient does not have insomnia.            Past Medical History:  Diagnosis Date   Arthritis     Arthritis of lumbar spine     AVN (avascular necrosis of bone) (HCC)      Left hip   Calcaneus fracture, left     Cancer (HCC)      Skin cancer Left Arm   GERD (gastroesophageal reflux disease)     Small bowel obstruction (HCC)  Past Surgical History:  Procedure Laterality Date   FOOT SURGERY       HAND SURGERY Right      contracture of hand   INGUINAL HERNIA REPAIR Right 11/23/2013    Procedure: HERNIA REPAIR INGUINAL ADULT;  Surgeon: Elsie GORMAN Holland, MD;  Location: [facility];  Service: General;  Laterality: Right;  site-inguinal area   OPEN REDUCTION, INTERNAL FIXATION (ORIF) CALCANEAL FRACTURE WITH FUSION Left 11/30/2018    Procedure: OPEN REDUCTION, INTERNAL  FIXATION (ORIF) CALCANEAL FRACTURE WITH PERONEAL DEBRIDEMENT & REPAIR OF SUPERIOR PERONEAL RETINACULUM;  Surgeon: Elsa Lonni SAUNDERS, MD;  Location: [facility];  Service: Orthopedics;  Laterality: Left;   VASECTOMY       WRIST SURGERY Right      otif               Family History  Problem Relation Age of Onset   Stroke Mother     Cancer Mother          colon   Alzheimer's disease Father          43 years   Heart disease Father          atrial fibri   Heart disease Sister     Early death Brother     Cancer Maternal Grandmother            Social History:  reports that he has never smoked. He has never used smokeless tobacco. He reports current alcohol use of about 4.0 standard drinks of alcohol per week. He reports that he does not use drugs.     Allergies      Allergies  Allergen Reactions   Bee Venom Rash   Morphine And Codeine Swelling              Medications Prior to Admission  Medication Sig Dispense Refill   Albuterol -Budesonide  (AIRSUPRA ) 90-80 MCG/ACT AERO Inhale 2 puffs into the lungs as needed (wheezing, shortness of breath). Max of 12 puffs per 24 hours 10.7 g 3   [EXPIRED] doxycycline  (VIBRA -TABS) 100 MG tablet Take 1 tablet (100 mg total) by mouth 2 (two) times daily for 7 days. 1 po bid 14 tablet 0   esomeprazole (NEXIUM) 20 MG capsule Take 20 mg by mouth daily at 12 noon.       famotidine  (PEPCID ) 20 MG tablet Take 1 tablet (20 mg total) by mouth 2 (two) times daily as needed for heartburn or indigestion (breakthrough not controlled by Protonix ). 180 tablet 3   fluticasone  (FLONASE ) 50 MCG/ACT nasal spray Place 2 sprays into both nostrils daily. 16 g 6   mirtazapine  (REMERON ) 7.5 MG tablet Take 1 tablet (7.5 mg total) by mouth at bedtime. For anxiety/ sleep 90 tablet 3   ondansetron  (ZOFRAN ) 4 MG tablet Take 1 tablet (4 mg total) by mouth every 8 (eight) hours as needed for nausea or vomiting. 20 tablet 0   sertraline  (ZOLOFT ) 50 MG tablet Take 1 tablet (50 mg  total) by mouth daily. 90 tablet 3              Home: Home Living Family/patient expects to be discharged to:: Private residence Living Arrangements: Spouse/significant other (wife) Available Help at Discharge: Family, Available 24 hours/day Type of Home: Mobile home Home Access: Ramped entrance Home Layout: One level Bathroom Shower/Tub: Tub/shower unit, Health Visitor: Standard Home Equipment: Agricultural Consultant (2 wheels), BSC/3in1, Cane - single point Additional Comments: PLOF and home setup provided by niece during session as  pt not a reliable reporter and providing conflicting information   Functional History: Prior Function Prior Level of Function : Independent/Modified Independent, Driving, Working/employed Mobility Comments: At baseline, pt is Ind for mobility without an AD. Report ADLs Comments: At baseline, pt is Independent with ADLs and ADLs. Pt drives and works in barista.   Functional Status:  Mobility: Bed Mobility Overal bed mobility: Needs Assistance Bed Mobility: Supine to Sit Rolling: Min assist Supine to sit: HOB elevated, Mod assist General bed mobility comments: pt received in chair Transfers Overall transfer level: Needs assistance Equipment used: Rolling walker (2 wheels), None Transfers: Sit to/from Stand Sit to Stand: Min assist, Mod assist General transfer comment: from chair with no AD and modA. From chair wtih RW and minA, decreased eccentric control to sit as he fatigues and pt does not recall to reach back for arm rests and delayed processing when cued to reach back; BP checked due to pt symptoms and pt found to be orthostatic. Pt performs reciprocal STS x 5 with HHA and modA, and needing minA to stand x2 reps to RW from chair, cues needed to initiate. Ambulation/Gait Ambulation/Gait assistance: Mod assist, Max assist Gait Distance (Feet): 128 Feet Assistive device: None, 1 person hand held assist Gait  Pattern/deviations: Step-through pattern, Decreased stride length, Narrow base of support General Gait Details: see pre-gait activities, pt orthostatic and c/o 10/10 modified RPE post-standing and exercises, so defer gait trial away from chair. Gait velocity interpretation: <1.8 ft/sec, indicate of risk for recurrent falls Pre-gait activities: standing hip flexion with HHA/no AD and gait belt support, ~4 weight shifts L/R with minimal hip/knee flexion when pt cued to march with HHA, pt reaching for RW as it was brought to him prior to performing 5th rep, then able to perform x5 more marches, then when PTA asked if he was fatigued, pt agrees, and sits. BP soft standing with MAP (60) and improved seated/supine and RN/MD notified.   ADL: ADL Overall ADL's : Needs assistance/impaired Eating/Feeding: Sitting, Set up Eating/Feeding Details (indicate cue type and reason): some assist opening containers and problem solving how to access food. able to cut up his food with originial methods Grooming: Sitting, Wash/dry hands, Wash/dry face, Minimal assistance (shampoo cap) Grooming Details (indicate cue type and reason): decreased thoroughness, cues for sequencing Upper Body Bathing: Moderate assistance, Cueing for sequencing, Cueing for compensatory techniques, Sitting Lower Body Bathing: Maximal assistance, Total assistance, Cueing for safety, Cueing for compensatory techniques, Cueing for sequencing, Bed level, +2 for safety/equipment Upper Body Dressing : Cueing for sequencing, Sitting, Minimal assistance Upper Body Dressing Details (indicate cue type and reason): extra gown like robe Lower Body Dressing: Total assistance, Bed level, Cueing for sequencing, Cueing for compensatory techniques, Cueing for safety, +2 for safety/equipment Toilet Transfer: Minimal assistance, Cueing for safety, Ambulation, Rolling walker (2 wheels) Toilet Transfer Details (indicate cue type and reason): slow pace, requires  min A for balance Toileting- Clothing Manipulation and Hygiene: Total assistance, +2 for safety/equipment, Bed level Toileting - Clothing Manipulation Details (indicate cue type and reason): currently with male purewick Functional mobility during ADLs: Minimal assistance, Rolling walker (2 wheels)   Cognition: Cognition Orientation Level: Oriented to person, Oriented to place, Disoriented to situation, Oriented to time Cognition Arousal: Alert Behavior During Therapy: Flat affect   Physical Exam: Blood pressure 98/63, pulse 62, temperature 98.2 F (36.8 C), temperature source Oral, resp. rate 17, height 5' 5 (1.651 m), weight 80.4 kg, SpO2 95%.     General:  No apparent distress HEENT: Head is normocephalic, atraumatic, sclera anicteric, oral mucosa pink and moist, wearing glasses Neck: Supple without JVD or lymphadenopathy Heart: Reg rate and rhythm. No murmurs rubs or gallops Chest: CTA bilaterally without wheezes, rales, or rhonchi; no distress Abdomen: Soft, non-tender, non-distended, bowel sounds positive. Extremities: No clubbing, cyanosis, or edema. Psych: Pt's affect is flat Skin: Clean and intact without signs of breakdown Neuro:     Mental Status: AAO to person, hospital, year and month not day, said [name] was president Speech/Languate: Naming and repetition intact, fluent, follows simple commands, + hypophonia  Recalls 1/3 words 3 min later CRANIAL NERVES: II: PERRL. Visual fields full III, IV, VI: EOM intact, no gaze preference or deviation Mild L ptosis-able to open V: normal sensation bilaterally VII: no asymmetry VIII: normal hearing to speech IX, X: normal palatal elevation XI: intact b/l head turn XII: Tongue midline     MOTOR: RUE: 4+ throughout LUE:4+ throughout RLE: HF 4-/5, KE 4/5, ADF 4/5, APF 4/5 LLE: HF 4-/5, KE 4/5, ADF 4/5, APF 4/5     REFLEXES: hyporeflexive throughout, no ankle clonus    SENSORY: Normal to touch all 4 extremities    Coordination: Normal finger to nose  no tremor, no dysmetria   L picc line     Lab Results Last 48 Hours        Results for orders placed or performed during the hospital encounter of 10/17/24 (from the past 48 hours)  Basic metabolic panel with GFR     Status: Abnormal    Collection Time: 10/29/24  4:55 AM  Result Value Ref Range    Sodium 137 135 - 145 mmol/L    Potassium 3.8 3.5 - 5.1 mmol/L    Chloride 103 98 - 111 mmol/L    CO2 22 22 - 32 mmol/L    Glucose, Bld 89 70 - 99 mg/dL      Comment: Glucose reference range applies only to samples taken after fasting for at least 8 hours.    BUN 25 (H) 8 - 23 mg/dL    Creatinine, Ser 9.12 0.61 - 1.24 mg/dL    Calcium  8.3 (L) 8.9 - 10.3 mg/dL    GFR, Estimated >39 >39 mL/min      Comment: (NOTE) Calculated using the CKD-EPI Creatinine Equation (2021)      Anion gap 12 5 - 15      Comment: Performed at [facility], [address], [place], [place] [address]      Imaging Results (Last 48 hours)  No results found.         Blood pressure 98/63, pulse 62, temperature 98.2 F (36.8 C), temperature source Oral, resp. rate 17, height 5' 5 (1.651 m), weight 80.4 kg, SpO2 95%.   Medical Problem List and Plan: 1. 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  Antithrombotics: -DVT/anticoagulation:  Pharmaceutical: Lovenox              -antiplatelet therapy:  3. Pain Management: tylenol  prn 4. Mood/Behavior/Sleep: LCSW to follow for evaluation and support.             --Melatonin prn for insomnia             -  antipsychotic agents: N/A 5. Neuropsych/cognition: This patient is not fully capable of making decisions on his own behalf. 6. Skin/Wound Care: Routine pressure relief  measures.  7. Fluids/Electrolytes/Nutrition: Monitor I/O. Check CMET in am. Monitor renal  status closely 8.  Herpes encephalitis: Continue IV acyclovir  thru 11/07/24             --IVF for hydration while on ayclovir to prevent renal injury.             --on decadron  to be tapered off tomorrow.  9. Seizures: Continue Keppra  1000 mg bid 10. Pre-renal azotemia: Encourage fluid intake--may need to increase IVF             -labs tomorrow  11. H/o Depression: was on Zoloft  50 mg w/Remeron  7.5 mg daily  12.  Hypocalcemia: Boderline low with ionized Calcium  1.14. Caltrate added.  13.  Steroid-induced hyperglycemia:             -Check on labs tomorrow  14. GERD: continue PPI     Pamela S Love, PA-C 10/30/2024   I have personally performed a face to face diagnostic evaluation of this patient and formulated the key components of the plan.  Additionally, I have personally reviewed laboratory data, imaging studies, as well as relevant notes and concur with the physician assistant's documentation above.   The patient's status has not changed from the original H&P.  Any changes in documentation from the acute care chart have been noted above.   Murray Collier, MD

## 2024-10-30 NOTE — Progress Notes (Signed)
 Occupational Therapy Assessment and Plan  Patient Details  Name: [name] MRN: [id number] Date of Birth: 1961-11-11  OT Diagnosis: {diagnoses:3041644} Rehab Potential:   ELOS:     {CHL IP REHAB OT TIME CALCULATIONS:304400400}    Hospital Problem: Principal Problem:   Herpesviral encephalitis   Past Medical History:  Past Medical History:  Diagnosis Date   Arthritis    Arthritis of lumbar spine    AVN (avascular necrosis of bone) (HCC)    Left hip   Calcaneus fracture, left    Cancer (HCC)    Skin cancer Left Arm   GERD (gastroesophageal reflux disease)    Small bowel obstruction (HCC)    Past Surgical History:  Past Surgical History:  Procedure Laterality Date   FOOT SURGERY     HAND SURGERY Right    contracture of hand   INGUINAL HERNIA REPAIR Right 11/23/2013   Procedure: HERNIA REPAIR INGUINAL ADULT;  Surgeon: Elsie GORMAN Holland, MD;  Location: [facility];  Service: General;  Laterality: Right;  site-inguinal area   OPEN REDUCTION, INTERNAL FIXATION (ORIF) CALCANEAL FRACTURE WITH FUSION Left 11/30/2018   Procedure: OPEN REDUCTION, INTERNAL FIXATION (ORIF) CALCANEAL FRACTURE WITH PERONEAL DEBRIDEMENT & REPAIR OF SUPERIOR PERONEAL RETINACULUM;  Surgeon: Elsa Lonni SAUNDERS, MD;  Location: [facility];  Service: Orthopedics;  Laterality: Left;   VASECTOMY     WRIST SURGERY Right    otif    Assessment & Plan Clinical Impression: Patient is a 63 year old male with  history of GERD, Left hip AVN, DDD lumbar spine, 3 week history of cough, congestion and HA who was started on doxycycline  on 11/07 without improvement and was admitted on 10/17/24 with mental status changes with confusion and difficulty talking. He was noted to be septic and was somnolent, febrile with T- 102.2, hyponatremic, had AKI with lactic acidosis. MRI brain done revealing abnormal diffusion with FLAIR signal abnormality involving left insular cortex, inferior left frontal lobe and left temporal lobe  concerning for HSV encephalitis. LP done revealing WBC 60, elevated protein @ 92. He was started on IVF, broad spectrum antibiotics as well as acyclovir  and decadron . He was loaded with Keppra  for seizure prophylaxis. CTA chest done which was negative for PE. EEG done which showed encephalopathy with seizures or epileptiform discharges. Meningoencephalitis panel positive for HSV and antibiotics d/c with recommendations to continue Acyclovir . Hospital course significant for decline in LOC concerning for seizure and was intubated for airway protection, loaded with Keppra  and placed on LT-EEG revealing evidence of epileptogenicity with cortical dysfunction arising form left hemisphere. He developed right  sided weakness on 11/16 and CT head done revealing marked progression of edema in mesial left temporal lobe and insula extending to Left. Right lobes c/w progressive herpes encephalitis as well as suspicion of non-mass occupying acute hemorrhage. He was started on IV decadron  and Keppra  increased to 1 gram BID due to continued irritability noted on EEG. Neurology considering continuing Keppra  indefinitely for now.  Dr. Overton recommended 3 week course or Acyclovir  with 7-10 day course of steroids as well as 5 day course of antibiotics for CAP/HAP which was completed on 11/19. Elevated BS due to tube feeds have resolved--Hgb A1c-5.6.  He tolerated extubation by 11/18 and started on regular diet as mentation was improving.  Patient transferred to CIR on 10/30/2024 .    Patient currently requires {ONJ:6958369} with {JIO:6958368} secondary to {pfejpmfzwud:6958367}.  Prior to hospitalization, patient could complete *** with {ONJ:6958369}.  Patient will benefit from skilled intervention  to {benefit of skilled intervention:3041641} prior to discharge {discharge:3041642}.  Anticipate patient will require {supervision/assistance:22779} and {follow le:6958356}.      OT Evaluation Precautions/Restrictions   Restrictions Weight Bearing Restrictions Per Provider Order: No Pain Pain Assessment Pain Scale: 0-10 Pain Score: 0-No pain Home Living/Prior Functioning   Vision   Perception    Praxis   Cognition   Sensation   Motor     Trunk/Postural Assessment     Balance   Extremity/Trunk Assessment      Care Tool Care Tool Self Care Eating        Oral Care         Bathing              Upper Body Dressing(including orthotics)            Lower Body Dressing (excluding footwear)          Putting on/Taking off footwear             Care Tool Toileting Toileting activity         Care Tool Bed Mobility Roll left and right activity        Sit to lying activity        Lying to sitting on side of bed activity         Care Tool Transfers Sit to stand transfer        Chair/bed transfer         Toilet transfer         Care Tool Cognition  Expression of Ideas and Wants    Understanding Verbal and Non-Verbal Content     Memory/Recall Ability     Refer to Care Plan for Long Term Goals  SHORT TERM GOAL WEEK 1    Recommendations for other services: {RECOMMENDATIONS FOR OTHER SERVICES:3049016}   Skilled Therapeutic Intervention ADL   Mobility    Skilled Intervention Evaluation completed (see details above) with education on OT POC and goals and individual treatment initiated with focus on functional mobility/transfers, ADL re-training,  generalized strengthening and endurance, dynamic standing balance/coordination, pt/family education and discharge planning. Pt education provided on therapy schedule and safety policy with use of chair alarm. Pt participated in goal setting. Pt participated in modified ADL task (See above for functional performance).    Discharge Criteria: Patient will be discharged from OT if patient refuses treatment 3 consecutive times without medical reason, if treatment goals not met, if there is a change in medical  status, if patient makes no progress towards goals or if patient is discharged from hospital.  The above assessment, treatment plan, treatment alternatives and goals were discussed and mutually agreed upon: {Assessment/Treatment Plan Discussed/Agreed:3049017}  Leita Howell, OTR/L,CBIS  Supplemental OT - [facility] Secure Chat Preferred   10/30/2024, 8:55 PM

## 2024-10-30 NOTE — Plan of Care (Signed)
  Problem: Education: Goal: Knowledge of General Education information will improve Description: Including pain rating scale, medication(s)/side effects and non-pharmacologic comfort measures Outcome: Progressing   Problem: Health Behavior/Discharge Planning: Goal: Ability to manage health-related needs will improve Outcome: Progressing   Problem: Clinical Measurements: Goal: Ability to maintain clinical measurements within normal limits will improve Outcome: Progressing Goal: Will remain free from infection Outcome: Progressing Goal: Diagnostic test results will improve Outcome: Progressing Goal: Respiratory complications will improve Outcome: Progressing Goal: Cardiovascular complication will be avoided Outcome: Progressing   Problem: Activity: Goal: Risk for activity intolerance will decrease Outcome: Progressing   Problem: Nutrition: Goal: Adequate nutrition will be maintained Outcome: Progressing   Problem: Coping: Goal: Level of anxiety will decrease Outcome: Progressing   Problem: Elimination: Goal: Will not experience complications related to bowel motility Outcome: Progressing Goal: Will not experience complications related to urinary retention Outcome: Progressing   Problem: Pain Managment: Goal: General experience of comfort will improve and/or be controlled Outcome: Progressing   Problem: Safety: Goal: Ability to remain free from injury will improve Outcome: Progressing   Problem: Skin Integrity: Goal: Risk for impaired skin integrity will decrease Outcome: Progressing   Problem: Medication: Goal: Risk for medication side effects will decrease Outcome: Progressing   Problem: Clinical Measurements: Goal: Complications related to the disease process, condition or treatment will be avoided or minimized Outcome: Progressing Goal: Diagnostic test results will improve Outcome: Progressing

## 2024-10-31 DIAGNOSIS — B004 Herpesviral encephalitis: Secondary | ICD-10-CM | POA: Diagnosis not present

## 2024-10-31 DIAGNOSIS — R498 Other voice and resonance disorders: Secondary | ICD-10-CM | POA: Diagnosis not present

## 2024-10-31 DIAGNOSIS — R269 Unspecified abnormalities of gait and mobility: Secondary | ICD-10-CM | POA: Diagnosis not present

## 2024-10-31 LAB — COMPREHENSIVE METABOLIC PANEL WITH GFR
ALT: 35 U/L (ref 0–44)
AST: 20 U/L (ref 15–41)
Albumin: 2.9 g/dL — ABNORMAL LOW (ref 3.5–5.0)
Alkaline Phosphatase: 39 U/L (ref 38–126)
Anion gap: 9 (ref 5–15)
BUN: 24 mg/dL — ABNORMAL HIGH (ref 8–23)
CO2: 23 mmol/L (ref 22–32)
Calcium: 8.4 mg/dL — ABNORMAL LOW (ref 8.9–10.3)
Chloride: 103 mmol/L (ref 98–111)
Creatinine, Ser: 1.16 mg/dL (ref 0.61–1.24)
GFR, Estimated: >60 mL/min (ref >60)
Glucose, Bld: 111 mg/dL — ABNORMAL HIGH (ref 70–99)
Potassium: 4.1 mmol/L (ref 3.5–5.1)
Sodium: 135 mmol/L (ref 135–145)
Total Bilirubin: 0.8 mg/dL (ref 0.0–1.2)
Total Protein: 6 g/dL — ABNORMAL LOW (ref 6.5–8.1)

## 2024-10-31 LAB — CBC WITH DIFFERENTIAL/PLATELET
Abs Immature Granulocytes: 0.09 K/uL — ABNORMAL HIGH (ref 0.00–0.07)
Basophils Absolute: 0 K/uL (ref 0.0–0.1)
Basophils Relative: 0 %
Eosinophils Absolute: 0 K/uL (ref 0.0–0.5)
Eosinophils Relative: 0 %
HCT: 38.6 % — ABNORMAL LOW (ref 39.0–52.0)
Hemoglobin: 13.5 g/dL (ref 13.0–17.0)
Immature Granulocytes: 1 %
Lymphocytes Relative: 12 %
Lymphs Abs: 1.2 K/uL (ref 0.7–4.0)
MCH: 32.9 pg (ref 26.0–34.0)
MCHC: 35 g/dL (ref 30.0–36.0)
MCV: 94.1 fL (ref 80.0–100.0)
Monocytes Absolute: 0.5 K/uL (ref 0.1–1.0)
Monocytes Relative: 5 %
Neutro Abs: 8.6 K/uL — ABNORMAL HIGH (ref 1.7–7.7)
Neutrophils Relative %: 82 %
Platelets: 178 K/uL (ref 150–400)
RBC: 4.1 MIL/uL — ABNORMAL LOW (ref 4.22–5.81)
RDW: 14.9 % (ref 11.5–15.5)
WBC: 10.4 K/uL (ref 4.0–10.5)
nRBC: 0 % (ref 0.0–0.2)

## 2024-10-31 NOTE — Plan of Care (Signed)
  Problem: RH Balance Goal: LTG Patient will maintain dynamic standing balance (PT) Description: LTG:  Patient will maintain dynamic standing balance with assistance during mobility activities (PT) Flowsheets (Taken 10/31/2024 1437) LTG: Pt will maintain dynamic standing balance during mobility activities with:: Independent with assistive device    Problem: Sit to Stand Goal: LTG:  Patient will perform sit to stand with assistance level (PT) Description: LTG:  Patient will perform sit to stand with assistance level (PT) Flowsheets (Taken 10/31/2024 1437) LTG: PT will perform sit to stand in preparation for functional mobility with assistance level: Independent with assistive device   Problem: RH Bed Mobility Goal: LTG Patient will perform bed mobility with assist (PT) Description: LTG: Patient will perform bed mobility with assistance, with/without cues (PT). Flowsheets (Taken 10/31/2024 1437) LTG: Pt will perform bed mobility with assistance level of: Independent with assistive device    Problem: RH Bed to Chair Transfers Goal: LTG Patient will perform bed/chair transfers w/assist (PT) Description: LTG: Patient will perform bed to chair transfers with assistance (PT). Flowsheets (Taken 10/31/2024 1437) LTG: Pt will perform Bed to Chair Transfers with assistance level: Independent with assistive device    Problem: RH Car Transfers Goal: LTG Patient will perform car transfers with assist (PT) Description: LTG: Patient will perform car transfers with assistance (PT). Flowsheets (Taken 10/31/2024 1437) LTG: Pt will perform car transfers with assist:: Supervision/Verbal cueing   Problem: RH Furniture Transfers Goal: LTG Patient will perform furniture transfers w/assist (OT/PT) Description: LTG: Patient will perform furniture transfers  with assistance (OT/PT). Flowsheets (Taken 10/31/2024 1437) LTG: Pt will perform furniture transfers with assist:: Supervision/Verbal cueing   Problem:  RH Ambulation Goal: LTG Patient will ambulate in controlled environment (PT) Description: LTG: Patient will ambulate in a controlled environment, # of feet with assistance (PT). Flowsheets (Taken 10/31/2024 1437) LTG: Pt will ambulate in controlled environ  assist needed:: Supervision/Verbal cueing LTG: Ambulation distance in controlled environment: more than150 ft using LRAD Goal: LTG Patient will ambulate in home environment (PT) Description: LTG: Patient will ambulate in home environment, # of feet with assistance (PT). Flowsheets (Taken 10/31/2024 1437) LTG: Pt will ambulate in home environ  assist needed:: Supervision/Verbal cueing LTG: Ambulation distance in home environment: up to 50 ft per bout using LRAD and navigating all turns and safely maneuvering obstacles   Problem: RH Stairs Goal: LTG Patient will ambulate up and down stairs w/assist (PT) Description: LTG: Patient will ambulate up and down # of stairs with assistance (PT) Flowsheets (Taken 10/31/2024 1437) LTG: Pt will ambulate up/down stairs assist needed:: Contact Guard/Touching assist LTG: Pt will  ambulate up and down number of stairs: at least one curb step using LRAD in order to mobilize in community

## 2024-10-31 NOTE — Patient Care Conference (Signed)
 Inpatient RehabilitationTeam Conference and Plan of Care Update Date: 10/31/2024   Time: 10:59 AM    Patient Name: [name]      Medical Record Number: [id number]  Date of Birth: May 09, 1961 Sex: Male         Room/Bed: 4W15C/4W15C-01 Payor Info: Payor: BLUE CROSS BLUE SHIELD / Plan: BCBS COMM PPO / Product Type: *No Product type* /    Admit Date/Time:  10/30/2024  4:54 PM  Primary Diagnosis:  Herpesviral encephalitis  Hospital Problems: Principal Problem:   Herpesviral encephalitis    Expected Discharge Date: Expected Discharge Date:  (eval pending)  Team Members Present: Physician leading conference: Dr. Prentice Compton Social Worker Present: Rhoda Clement, LCSW Nurse Present: Barnie Ronde, RN PT Present: Recardo Milliner, PT OT Present: Recardo Maxwell, OT SLP Present: Blaise Alderman, SLP     Current Status/Progress Goal Weekly Team Focus  Bowel/Bladder   incontinent of b/b; LBM: 11/26   gain continence   assist with toileting prn    Swallow/Nutrition/ Hydration   pending eval           ADL's   eval pending            Mobility      Evals pending         Communication   pending eval            Safety/Cognition/ Behavioral Observations  pending eval            Pain   no c/o pain   remain pain free   assess pain QS and prn    Skin   skin intact   maintain skin integrity  assess skin QS and prn      Discharge Planning:  New evaluation-home with wife who is able to assist will confirm the discharge plan   Team Discussion: Patient admitted post HSV encephalitis - cerebral edema with cognitive issues; memory, decreased awareness and poor judgement,  fatigue, dysphonia and balance issues. Progress limited by activity tolerance, and orthostatic symptoms.  Patient on target to meet rehab goals: Currently able to complete sit - stand with mod assist without using adaptive equipment.  *See Care Plan and progress notes for long and  short-term goals.   Revisions to Treatment Plan:  N/a   Teaching Needs: Safety, medications, transfers, toileting, etc.   Current Barriers to Discharge: Decreased caregiver support  Possible Resolutions to Barriers: Family education     Medical Summary Current Status: dysphonia, fatigue, balance and cognitive issues  Barriers to Discharge: Medical stability;Infection/IV Antibiotics   Possible Resolutions to [employer]: will need to slowly increase his endurance and activity tolerance may need neurostim   Continued Need for Acute Rehabilitation Level of Care: The patient requires daily medical management by a physician with specialized training in physical medicine and rehabilitation for the following reasons: Direction of a multidisciplinary physical rehabilitation program to maximize functional independence : Yes Medical management of patient stability for increased activity during participation in an intensive rehabilitation regime.: Yes Analysis of laboratory values and/or radiology reports with any subsequent need for medication adjustment and/or medical intervention. : Yes   I attest that I was present, lead the team conference, and concur with the assessment and plan of the team.   Ronde Barnie B 10/31/2024, 3:03 PM

## 2024-10-31 NOTE — Progress Notes (Signed)
 Inpatient Rehabilitation Center Individual Statement of Services  Patient Name:  [name]  Date:  10/31/2024  Welcome to the Inpatient Rehabilitation Center.  Our goal is to provide you with an individualized program based on your diagnosis and situation, designed to meet your specific needs.  With this comprehensive rehabilitation program, you will be expected to participate in at least 3 hours of rehabilitation therapies Monday-Friday, with modified therapy programming on the weekends.  Your rehabilitation program will include the following services:  Physical Therapy (PT), Occupational Therapy (OT), Speech Therapy (ST), 24 hour per day rehabilitation nursing, Therapeutic Recreaction (TR), Neuropsychology, Care Coordinator, Rehabilitation Medicine, Nutrition Services, and Pharmacy Services  Weekly team conferences will be held on Wednesday to discuss your progress.  Your Inpatient Rehabilitation Care Coordinator will talk with you frequently to get your input and to update you on team discussions.  Team conferences with you and your family in attendance may also be held.  Expected length of stay: 18-21 days  Overall anticipated outcome: supervision with cues  Depending on your progress and recovery, your program may change. Your Inpatient Rehabilitation Care Coordinator will coordinate services and will keep you informed of any changes. Your Inpatient Rehabilitation Care Coordinator's name and contact numbers are listed  below.  The following services may also be recommended but are not provided by the Inpatient Rehabilitation Center:  Driving Evaluations Home Health Rehabiltiation Services Outpatient Rehabilitation Services Vocational Rehabilitation   Arrangements will be made to provide these services after discharge if needed.  Arrangements include referral to agencies that provide these services.  Your insurance has been verified to be:  BCBS Your primary doctor is:  Journalist, Newspaper  Pertinent information will be shared with your doctor and your insurance company.  Inpatient Rehabilitation Care Coordinator:  Rhoda Clement, KEN [phone] or [phone]  Information discussed with and copy given to patient by: Clement [name], 10/31/2024, 1:50 PM

## 2024-10-31 NOTE — Progress Notes (Signed)
 Physical Therapy Session Note  Patient Details  Name: [name] MRN: [id number] Date of Birth: December 18, 1960  Today's Date: 10/31/2024 PT Individual Time: 1450-1530 PT Individual Time Calculation (min): 40 min   Short Term Goals: Week 1:  PT Short Term Goal 1 (Week 1): Pt will perform bed mobility with overall SBA and improved initiation with no more than 2 vc. PT Short Term Goal 2 (Week 1): Pt will perform sit<>stand with overall SBA and improved initiation with no more than 2 vc. PT Short Term Goal 3 (Week 1): Pt will performstand pivot transfers with consistent CGA  and using RW with reduced vc for positioning. PT Short Term Goal 4 (Week 1): Pt will ambulate at least 75 ft using RW with consistent CGA. PT Short Term Goal 5 (Week 1): Pt will initiate step/ stair training.  Skilled Therapeutic Interventions/Progress Updates: Chart reviewed and eval and goals verbally discussed prior to session.  Pt presented in bed sleeping but easily aroused and agreeable totherapy. Pt denies pain at start of session. Session focused on functional mobility and therex for general strengthening. Pt completed supine to sit with supervision, use of bed features, and increased time. Performed Sit to stand from lowered bed with light minA and cues for hand placement. Participated in the following activities: LAQ, seated marching, hamstring pulls with red theraband, hip ER with red theraband all performed to fatigue ~x15 bilaterally. Pt did require intermittent cues for sustained task and noted increased time to initiate. Pt also participated in standing toe taps to 2in step x 5, however activity terminated early as pt's brief fell to ankles. Upon inspection pt's brief noted to be soiled. PTA obtained wash cloth and pt was able to complete peri-care in standing with CGA and increased time. Pt required total A for donning new brief. Once on completed several lateral steps with CGA and RW to [facility]. Pt completed sit to  supine transfer with supervision and repositioned to comfort. Pt left in bed at end of session with bed alarm on, call bell within reach and needs met.    Therapy Documentation Precautions:  Precautions Precautions: Fall Recall of Precautions/Restrictions: Impaired Precaution/Restrictions Comments: urinary incontinence, orthostatic hypotension 11/24 Restrictions Weight Bearing Restrictions Per Provider Order: No General:   Vital Signs: Therapy Vitals Temp: 97.7 F (36.5 C) Temp Source: Oral Pulse Rate: 62 Resp: 18 BP: 105/71 Patient Position (if appropriate): Lying Oxygen  Therapy SpO2: 100 % O2 Device: Room Air Pain: Pain Assessment Pain Scale: 0-10 Pain Score: 0-No pain Mobility: Bed Mobility Bed Mobility: Supine to Sit;Sit to Supine Supine to Sit: Minimal Assistance - Patient > 75% Sit to Supine: Contact Guard/Touching assist Transfers Transfers: Sit to Stand;Stand to Sit;Stand Pivot Transfers Sit to Stand: Minimal Assistance - Patient > 75% Stand to Sit: Minimal Assistance - Patient > 75% Stand Pivot Transfers: Minimal Assistance - Patient > 75%;Moderate Assistance - Patient 50 - 74% Stand Pivot Transfer Details: Verbal cues for sequencing;Verbal cues for precautions/safety;Verbal cues for gait pattern;Verbal cues for safe use of DME/AE Transfer (Assistive device): Rolling walker Locomotion : Gait Ambulation: Yes Gait Distance (Feet): 20 Feet Assistive device: Rolling walker Gait Gait: Yes Gait Pattern: Impaired Gait Pattern: Step-through pattern;Decreased weight shift to left Gait velocity: Significantly decreased pace requiring vc for timing and to increase frequency of step Stairs / Additional Locomotion Stairs: No Wheelchair Mobility Wheelchair Mobility: No  Trunk/Postural Assessment : Cervical Assessment Cervical Assessment: Exceptions to [facility] (neck flexion with forward head) Thoracic Assessment Thoracic Assessment: Exceptions to  WFL (rounded  shoulders) Lumbar Assessment Lumbar Assessment: Within Functional Limits Postural Control Postural Control: Deficits on evaluation Righting Reactions: significantly delayed Protective Responses: significantly delayed/ diminished  Balance: Balance Balance Assessed: Yes Static Sitting Balance Static Sitting - Balance Support: Bilateral upper extremity supported;Feet unsupported Static Sitting - Level of Assistance: Other (comment);5: Stand by assistance (CGA) Dynamic Sitting Balance Dynamic Sitting - Balance Support: Bilateral upper extremity supported;Feet supported Dynamic Sitting - Level of Assistance: Other (comment) (CGA) Static Standing Balance Static Standing - Balance Support: No upper extremity supported Static Standing - Level of Assistance: 4: Min assist Dynamic Standing Balance Dynamic Standing - Balance Support: No upper extremity supported Dynamic Standing - Level of Assistance: 3: Mod assist;4: Min assist Exercises:   Other Treatments:      Therapy/Group: Individual Therapy  Higinio Grow 10/31/2024, 4:11 PM

## 2024-10-31 NOTE — Progress Notes (Signed)
 Inpatient Rehabilitation  Patient information reviewed and entered into eRehab system by [facility]. Karen Kays., CCC/SLP, PPS Coordinator.  Information including medical coding, functional ability and quality indicators will be reviewed and updated through discharge.

## 2024-10-31 NOTE — Progress Notes (Signed)
 Patient ID: [name], male   DOB: [birth year], 63 y.o.   MRN: [id number] Met with the patient to review current medical situation, rehab process, team conference and plan of care. Note cognitive deficits and memory deficits; able to share prior profession and wife working. Reviewed medications briefly as very fatigued post initial therapy session. Continue to follow along to address educational needs to facilitate preparation for discharge. [name]

## 2024-10-31 NOTE — Progress Notes (Signed)
 Inpatient Rehabilitation Care Coordinator Assessment and Plan Patient Details  Name: [name] MRN: [id number] Date of Birth: [birth year]  Today's Date: 10/31/2024  Hospital Problems: Principal Problem:   Herpesviral encephalitis  Past Medical History:  Past Medical History:  Diagnosis Date   Arthritis    Arthritis of lumbar spine    AVN (avascular necrosis of bone) (HCC)    Left hip   Calcaneus fracture, left    Cancer (HCC)    Skin cancer Left Arm   GERD (gastroesophageal reflux disease)    Small bowel obstruction (HCC)    Past Surgical History:  Past Surgical History:  Procedure Laterality Date   FOOT SURGERY     HAND SURGERY Right    contracture of hand   INGUINAL HERNIA REPAIR Right 11/23/2013   Procedure: HERNIA REPAIR INGUINAL ADULT;  Surgeon: Elsie GORMAN Holland, MD;  Location: [facility];  Service: General;  Laterality: Right;  site-inguinal area   OPEN REDUCTION, INTERNAL FIXATION (ORIF) CALCANEAL FRACTURE WITH FUSION Left 11/30/2018   Procedure: OPEN REDUCTION, INTERNAL FIXATION (ORIF) CALCANEAL FRACTURE WITH PERONEAL DEBRIDEMENT & REPAIR OF SUPERIOR PERONEAL RETINACULUM;  Surgeon: Elsa Lonni SAUNDERS, MD;  Location: [facility] OR;  Service: Orthopedics;  Laterality: Left;   VASECTOMY     WRIST SURGERY Right    otif   Social History:  reports that he has never smoked. He has never used smokeless tobacco. He reports current alcohol use of about 4.0 standard drinks of alcohol per week. He reports that he does not use drugs.  Family / Support Systems Marital Status: Married Patient Roles: Spouse, Other (Comment) (employee/sibling) Spouse/Significant Other: [name] [phone] Other Supports: Judy-sister [phone]  Niece Anticipated Caregiver: Wife and sister Ability/Limitations of Caregiver: Wife works from home and can assist pt if needed Caregiver Availability: 24/7 Family Dynamics: Close knit with wife and sister and sister's children. 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 Will look toward his wife for any decisions while here   Abuse/Neglect Abuse/Neglect Assessment Can Be Completed: Yes Physical Abuse: Denies Verbal Abuse: Denies Sexual Abuse: Denies Exploitation of patient/patient's resources: Denies Self-Neglect: Denies  Patient response to: Social Isolation - How often do you feel lonely or isolated from those around you?: Rarely  Emotional Status Pt's affect, behavior and adjustment status: Pt is exhausted but did answer a few questions. He was independent prior to this happening and worked full time and according to wife never sat still. This has thrown him for a loop and he has been dealing with this for three weeks now. Recent Psychosocial Issues: other health issues was healthy he thought prior to admission Psychiatric History: Hx-anxiety takes medications for this and would benefit from seeing neuro-psych while here for coping Substance Abuse History: NA  Patient / Family Perceptions, Expectations & Goals Pt/Family understanding of illness & functional limitations: Wife can explain husband's illness and hopefully is on the road to recovery. Both realize it will take time to recover but hope he does well here and can  get a hwads start. Both do talk with the MD's involved. Premorbid pt/family roles/activities: husband, uncle, brohter, employee, friend Anticipated changes in  roles/activities/participation: resume Pt/family expectations/goals: Pt states:  I'm so tired from all of this.   Wife states:  I will do what I can he is very independent before all of this.  Community [employer] Agencies: None Premorbid Home Care/DME Agencies: Other (Comment) (cane, rw, neb, bsc from other family members) Transportation available at discharge: wife Is the patient able to respond to transportation needs?: Yes In the past 12 months, has lack of transportation kept you from medical appointments or from getting medications?: No In the past 12 months, has lack of transportation kept you from meetings, work, or from getting things needed for daily living?: No Resource referrals recommended: Neuropsychology  Discharge Planning Living Arrangements: Spouse/significant other Support Systems: Spouse/significant other, Other relatives, Friends/neighbors Type of Residence: Private residence Insurance Resources: Media [employer]anner (specify) HERBALIST) Financial Resources: Employment, Garment/textile Technologist Screen Referred: No Living Expenses: Own Money Management: Patient, Spouse Does the patient have any problems obtaining your medications?: No Home Management: both Patient/Family Preliminary Plans: Return home with wife who does work from home and can assist. He does have a sister and two nieces who are involved and do visit. Will await team's evaluations and work on discharge needs. Care Coordinator Barriers to Discharge: Insurance for SNF coverage Care Coordinator Anticipated Follow Up Needs: HH/OP  Clinical Impression Exhausted gentleman who needs to get into bed after each therapy session. Obtained most of the information from his wife who reports he was very independent and has been dealing with this for three weeks and just kept getting sicker. Will await therapy evaluations and work on discharge needs.   Raymonde [name] 10/31/2024, 12:57 PM

## 2024-10-31 NOTE — Evaluation (Signed)
 Speech Language Pathology Assessment and Plan  Patient Details  Name: [name] MRN: [id number] Date of Birth: 31-Dec-1960  SLP Diagnosis: Voice disorder;Cognitive Impairments;Speech and Language deficits;Dysphagia  Rehab Potential: Good ELOS: 7-10 days    Today's Date: 10/31/2024 SLP Individual [id number]  - [id number]   and Today's Date: 10/31/2024 SLP Missed Time:  15 Missed Time Reason:  wound care; nursing care   Hospital Problem: Principal Problem:   Herpesviral encephalitis  Past Medical History:  Past Medical History:  Diagnosis Date   Arthritis    Arthritis of lumbar spine    AVN (avascular necrosis of bone) (HCC)    Left hip   Calcaneus fracture, left    Cancer (HCC)    Skin cancer Left Arm   GERD (gastroesophageal reflux disease)    Small bowel obstruction (HCC)    Past Surgical History:  Past Surgical History:  Procedure Laterality Date   FOOT SURGERY     HAND SURGERY Right    contracture of hand   INGUINAL HERNIA REPAIR Right 11/23/2013   Procedure: HERNIA REPAIR INGUINAL ADULT;  Surgeon: Elsie GORMAN Holland, MD;  Location: [facility];  Service: General;  Laterality: Right;  site-inguinal area   OPEN REDUCTION, INTERNAL FIXATION (ORIF) CALCANEAL FRACTURE WITH FUSION Left 11/30/2018   Procedure: OPEN REDUCTION, INTERNAL FIXATION (ORIF) CALCANEAL FRACTURE WITH PERONEAL DEBRIDEMENT & REPAIR OF SUPERIOR PERONEAL RETINACULUM;  Surgeon: Elsa Lonni SAUNDERS, MD;  Location: [facility];  Service: Orthopedics;  Laterality: Left;   VASECTOMY     WRIST SURGERY Right    otif    Assessment / Plan / Recommendation Clinical Impression  HPI: [name] is a 63 y/o gentleman admitted after a fall on 11/12. 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 ON 11/14, he began developing new seizure activity and was intubated from 11/14 until 11/18;MBS generated to assess swallow function after intermittent throat clearing/cough noted on BSE on 10/25/24. Pmhx significant for GERD.    Skilled  Therapeutic Interventions          Pt seen for clinical swallow evaluation and cognitive-communication assessment. Limited cognitive-communication assessment due to nursing care.   Swallowing: pt presents with mild oral dysphagia characterized by intermittent reduced labial seal around cup and medial anterior loss. No oral pocketing observed with solids. Pharyngeal swallow appeared prompt with no overt or subtle s/s of aspiration noticed. Occasional belches observed, which can be a symptoms of esophageal dysphagia. Pt with concern for esophageal component of dysphagia per MBSS on 10/29/24 with reflux precautions in place.   Cognitive-Communication:  Pt presents with moderate cognitive-linguistic deficits per informal and limited assessment today. Pt is oriented to self and DOB. 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 There is a delay with auditory processing of simple directions and directions needed to be repeated multiple times. Delayed verbal response time in conversation. Voice is severely dysphonic vs aphonic. Pt was extubated over one week ago, so we would anticipate vocal improvements by this time. Consider an ENT consult if voice has not improved over the next 1-2 weeks. Possible cognitive component to voice? Though further assessment is needed.   Pt will need ongoing SLP intervention to address swallowing and cognitive-communication goals. Continue SLP PoC.    SLP Assessment  Patient will need skilled Speech Lanaguage Pathology Services during CIR admission    Recommendations  SLP Diet Recommendations: Age appropriate regular solids;Thin Liquid Administration via: Cup;Straw Medication Administration: Crushed with puree Supervision: Patient able to self feed Compensations: Slow rate;Small  sips/bites;Minimize environmental distractions;Multiple dry swallows after each bite/sip;Follow solids with liquid Postural Changes and/or Swallow Maneuvers: Seated upright 90 degrees;Upright  30-60 min after meal Oral Care Recommendations: Oral care BID Patient destination: Home Follow up Recommendations: Outpatient SLP;Home Health SLP;24 hour supervision/assistance Equipment Recommended: None recommended by SLP    SLP Frequency 3 to 5 out of 7 days   SLP Duration  SLP Intensity  SLP Treatment/Interventions 7-10 days  Minumum of 1-2 x/day, 30 to 90 minutes  Cognitive remediation/compensation;Cueing hierarchy;Functional tasks;Patient/family education;Oral motor exercises;Therapeutic Activities;Speech/Language facilitation;Internal/external aids;Dysphagia/aspiration precaution training    Pain Pain Assessment Pain Scale: 0-10 Pain Score: 0-No pain  Prior Functioning Type of Home: Mobile home  Lives With: Spouse Available Help at Discharge: Family;Available 24 hours/day Vocation: Full time employment  SLP Evaluation Cognition Overall Cognitive Status: Impaired/Different from baseline Arousal/Alertness: Awake/alert Orientation Level: Oriented to person;Oriented to place;Oriented to time Memory: (P) Impaired Problem Solving: (P) Impaired Safety/Judgment: (P) Impaired  Comprehension   Expression Written Expression Dominant Hand: Right Oral Motor    Care Tool Care Tool Cognition Ability to hear (with hearing aid or hearing appliances if normally used Ability to hear (with hearing aid or hearing appliances if normally used): 0. Adequate - no difficulty in normal conservation, social interaction, listening to TV   Expression of Ideas and Wants Expression of Ideas and Wants: 3. Some difficulty - exhibits some difficulty with expressing needs and ideas (e.g, some words or finishing thoughts) or speech is not clear   Understanding Verbal and Non-Verbal Content Understanding Verbal and Non-Verbal Content: 2. Sometimes understands - understands only basic conversations or simple, direct phrases. Frequently requires cues to understand  Memory/Recall Ability  Memory/Recall Ability : None of the above were recalled    Bedside Swallowing Assessment General    Oral Care Assessment Oral Assessment  (WDL): Within Defined Limits Lips: Symmetrical Teeth: Missing (Comment) Tongue: Pink;Moist Mucous Membrane(s): Moist;Pink Saliva: Moist, saliva free flowing Level of Consciousness: Alert Is patient on any of following O2 devices?: None of the above Nutritional status: No high risk factors Oral Assessment Risk : Low Risk  Thin Liquid   Anterior loss  Puree Puree: Within functional limits Presentation: Self Fed Solid  WNL BSE Assessment Suspected Esophageal Findings Suspected Esophageal Findings: Belching Risk for Aspiration Impact on safety and function: Mild aspiration risk Other Related Risk Factors: Prolonged intubation;Deconditioning;Cognitive impairment  Short Term Goals: Week 1: SLP Short Term Goal 1 (Week 1): Pt will demonstrated improved oral containment with cup sups of thin liquids and reduce or eliminate anterior loss in 80% of opportunities. SLP Short Term Goal 2 (Week 1): Pt will complete simple problem solving tasks with min verbal cues with >50% accuracy. SLP Short Term Goal 3 (Week 1): Pt will follow multi-step direction with min verbal cues with >50% accuracy. SLP Short Term Goal 4 (Week 1): Pt will attend to structured tasks for >5 minutes with min verbal cues. SLP Short Term Goal 5 (Week 1): Pt will participate in education regarding voice and vocal hygiene with min cues.  Refer to Care Plan for Long Term Goals  Recommendations for other services: None   Discharge Criteria: Patient will be discharged from SLP if patient refuses treatment 3 consecutive times without medical reason, if treatment goals not met, if there is a change in medical status, if patient makes no progress towards goals or if patient is discharged from hospital.  The above assessment, treatment plan, treatment alternatives and goals were discussed  and mutually agreed upon: by  patient  Baillie Mohammad J Deshauna Cayson 10/31/2024, 1:24 PM

## 2024-10-31 NOTE — Plan of Care (Signed)
  Problem: RH Swallowing Goal: LTG Pt will demonstrate functional change in swallow as evidenced by bedside/clinical objective assessment (SLP) Description: LTG: Patient will demonstrate functional change in swallow as evidenced by bedside/clinical objective assessment (SLP) Flowsheets (Taken 10/31/2024 0826) LTG: Patient will demonstrate functional change in swallow as evidenced by bedside/clinical objective assessment: Oral swallow   Problem: RH Cognition - SLP Goal: RH LTG Patient will demonstrate orientation with cues Description:  LTG:  Patient will demonstrate orientation to person/place/time/situation with cues (SLP)   Flowsheets (Taken 10/31/2024 0826) LTG: Patient will demonstrate orientation using cueing (SLP): Supervision   Problem: RH Problem Solving Goal: LTG Patient will demonstrate problem solving for (SLP) Description: LTG:  Patient will demonstrate problem solving for basic/complex daily situations with cues  (SLP) Flowsheets (Taken 10/31/2024 0826) LTG Patient will demonstrate problem solving for: Supervision   Problem: RH Memory Goal: LTG Patient will demonstrate ability for day to day (SLP) Description: LTG:   Patient will demonstrate ability for day to day recall/carryover during cognitive/linguistic activities with assist  (SLP) Flowsheets (Taken 10/31/2024 0826) LTG: Patient will demonstrate ability for day to day recall/carryover during cognitive/linguistic activities with assist (SLP): Supervision   Problem: RH Memory Goal: LTG Patient will use memory compensatory aids to (SLP) Description: LTG:  Patient will use memory compensatory aids to recall biographical/new, daily complex information with cues (SLP) Flowsheets (Taken 10/31/2024 0826) LTG: Patient will use memory compensatory aids to (SLP): Supervision   Problem: RH Awareness Goal: LTG: Patient will demonstrate awareness during functional activites type of (SLP) Description: LTG: Patient will demonstrate  awareness during functional activites type of (SLP) Flowsheets (Taken 10/31/2024 0826) LTG: Patient will demonstrate awareness during cognitive/linguistic activities with assistance of (SLP): Supervision

## 2024-10-31 NOTE — Progress Notes (Signed)
 Occupational Therapy Note  Patient Details  Name: [name] MRN: [id number] Date of Birth: November 18, 1961  Occupational Therapist participated in the interdisciplinary team conference, providing clinical information regarding the patient's current status, treatment goals, and weekly focus, including any barriers that need to be addressed. Please see the Inpatient Rehabilitation Team Conference and Plan of Care Update for further details.       [name] 10/31/2024, 10:52 AM

## 2024-10-31 NOTE — Evaluation (Signed)
 Physical Therapy Assessment and Plan  Patient Details  Name: [name] MRN: [id number] Date of Birth: [birth year]  PT Diagnosis: Difficulty walking, Impaired cognition, and Muscle weakness Rehab Potential: Good ELOS: 18-21 days   Today's Date: 10/31/2024 PT Individual Time: 8696-8580 PT Individual Time Calculation (min): 76 min    Hospital Problem: Principal Problem:   Herpesviral encephalitis   Past Medical History:  Past Medical History:  Diagnosis Date   Arthritis    Arthritis of lumbar spine    AVN (avascular necrosis of bone) (HCC)    Left hip   Calcaneus fracture, left    Cancer (HCC)    Skin cancer Left Arm   GERD (gastroesophageal reflux disease)    Small bowel obstruction (HCC)    Past Surgical History:  Past Surgical History:  Procedure Laterality Date   FOOT SURGERY     HAND SURGERY Right    contracture of hand   INGUINAL HERNIA REPAIR Right 11/23/2013   Procedure: HERNIA REPAIR INGUINAL ADULT;  Surgeon: Elsie GORMAN Holland, MD;  Location: [facility];  Service: General;  Laterality: Right;  site-inguinal area   OPEN REDUCTION, INTERNAL FIXATION (ORIF) CALCANEAL FRACTURE WITH FUSION Left 11/30/2018   Procedure: OPEN REDUCTION, INTERNAL FIXATION (ORIF) CALCANEAL FRACTURE WITH PERONEAL DEBRIDEMENT & REPAIR OF SUPERIOR PERONEAL RETINACULUM;  Surgeon: Elsa Lonni SAUNDERS, MD;  Location: [facility];  Service: Orthopedics;  Laterality: Left;   VASECTOMY     WRIST SURGERY Right    otif    Assessment & Plan Clinical Impression: Patient is a 63 y.o.male with  history of GERD, Left hip AVN, DDD lumbar spine, 3 week history of cough, congestion and HA who was started on doxycycline  on 11/07 without improvement and was admitted on 10/17/24 with mental status changes with confusion and difficulty talking. He was noted to be septic and was somnolent, febrile with T- 102.2, hyponatremic, had AKI with lactic acidosis. 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 LP done revealing WBC 60, elevated protein @ 92. He was started on IVF, broad spectrum antibiotics as well as acyclovir  and decadron . He was loaded with Keppra  for seizure prophylaxis. CTA chest done which was negative for PE. EEG done which showed encephalopathy with seizures or epileptiform discharges. Meningoencephalitis panel positive for HSV and antibiotics d/c with recommendations to continue Acyclovir     Hospital course significant for decline in LOC concerning for seizure and was intubated for airway protection, loaded with Keppra  and placed on LT-EEG revealing evidence of epileptogenicity with cortical dysfunction arising form left hemisphere. He developed right  sided weakness on 11/16 and CT head done revealing marked progression of edema in mesial left temporal lobe and insula extending to Left. Right lobes c/w progressive herpes encephalitis as well as suspicion of non-mass occupying acute hemorrhage. He was started on IV decadron  and Keppra  increased to 1 gram BID due to continued irritability noted on EEG. Neurology considering continuing Keppra  indefinitely for now.  Dr. Overton recommended 3 week course or Acyclovir  with 7-10 day course of steroids as well as 5 day course of antibiotics for CAP/HAP which was completed on 11/19. Elevated BS due to tube feeds have resolved--Hgb A1c-5.6.  He tolerated extubation by 11/18 and started on regular diet as mentation was improving    He continues to have dysphonia, has delayed processing with difficulty sequencing, has decreased judgement/awareness, tends to fatigue easily and requires  min to to mod assist for standing and mod to max assist to march in place with balance deficits and min to max assist with ADL tasks. He was independent PTA and CIR recommended due to functional decline. Patient transferred to CIR on 10/30/2024 .    Patient currently requires up to mod assist with mobility secondary to muscle weakness, decreased cardiorespiratoy endurance, impaired timing and sequencing, decreased motor planning, decreased initiation, decreased awareness, decreased problem solving, decreased safety awareness, decreased memory, and delayed processing, and decreased standing balance and decreased balance strategies.  Prior to hospitalization, patient was modified independent  with mobility and lived with Spouse in a Mobile home home.  Home access is  Ramped entrance.  Patient will benefit from skilled PT intervention to maximize safe functional mobility, minimize fall risk, and decrease caregiver burden for planned discharge home with 24 hour supervision.  Anticipate patient will benefit from follow up HH at discharge.  PT - End of Session Activity Tolerance: Tolerates 30+ min activity with multiple rests PT Assessment Rehab Potential (ACUTE/IP ONLY): Good PT Barriers to Discharge: Decreased caregiver support;IV antibiotics;Incontinence;Wound Care;Lack of/limited family support;Insurance for SNF coverage PT Patient demonstrates impairments in the following area(s): Balance;Edema;Endurance;Motor;Nutrition;Pain;Perception;Safety;Sensory;Skin Integrity PT Transfers Functional Problem(s): Bed Mobility;Bed to Chair;Car;Furniture PT Locomotion Functional Problem(s): Ambulation;Stairs PT Plan PT Intensity: Minimum of 1-2 x/day ,45 to 90 minutes PT Frequency: 5 out of 7 days PT Duration Estimated Length of Stay: 18-21 days PT Treatment/Interventions: Ambulation/gait training;Community reintegration;DME/adaptive equipment instruction;Neuromuscular re-education;Psychosocial support;Stair training;UE/LE Strength taining/ROM;Balance/vestibular training;Discharge planning;Therapeutic Activities;UE/LE Coordination activities;Cognitive remediation/compensation;Disease management/prevention;Functional mobility training;Patient/family  education;Splinting/orthotics;Therapeutic Exercise;Visual/perceptual remediation/compensation PT Transfers Anticipated Outcome(s): Mod I PT Locomotion Anticipated Outcome(s): supervision PT Recommendation Recommendations for Other Services: Neuropsych consult Follow Up Recommendations: Home health PT;24 hour supervision/assistance Patient destination: Home Equipment Recommended: To be determined   PT Evaluation Precautions/Restrictions Precautions Precautions: Fall Precaution/Restrictions Comments: urinary incontinence, orthostatic hypotension 11/24 Restrictions Weight Bearing Restrictions Per Provider Order: No General   Vital SignsTherapy Vitals Temp: 97.7 F (36.5 C) Temp Source: Oral Pulse Rate: 62 Resp: 18 BP: 105/71 Patient Position (if appropriate): Lying Oxygen  Therapy SpO2: 100 % O2 Device: Room Air Pain Pain Assessment Pain Scale: 0-10 Pain Score: 0-No pain Pain Interference Pain Interference Pain Effect on Sleep: 0. Does not apply - I have not had any pain or hurting in the past 5 days Pain Interference with Therapy Activities: 0. Does not apply - I have not received rehabilitationtherapy in the past 5 days Pain Interference with Day-to-Day Activities: 1. Rarely or not at all Home Living/Prior Functioning Home Living Living Arrangements: Spouse/significant other Available Help at Discharge: Family;Available 24 hours/day Type of Home: Mobile home Home Access: Ramped entrance Home Layout: One level Bathroom Shower/Tub: Tub/shower unit;Walk-in shower Bathroom Toilet: Standard  Lives With: Spouse Prior Function Level of Independence: Independent with basic ADLs;Independent with transfers;Independent with gait  Able to Take Stairs?: Yes Driving: Yes Vocation: Full time employment Vocation Requirements: works in naval architect for [employer] Vision/Perception  Vision - History Ability to See in Adequate Light: 0 Adequate Perception Perception: Within Functional  Limits Praxis Praxis: WFL  Cognition Overall Cognitive Status: Impaired/Different from baseline Arousal/Alertness: Awake/alert Orientation Level: Oriented to person;Oriented to place;Oriented to time Attention: Focused;Sustained;Selective Focused Attention: Impaired Sustained Attention: Impaired Selective Attention: Impaired Memory: Impaired Awareness: Impaired Problem Solving: Impaired Executive Function: Decision Making;Initiating;Self Monitoring;Self Correcting Decision Making: Impaired Initiating: Impaired Self Monitoring: Impaired Self Correcting: Impaired Safety/Judgment: Impaired Sensation Sensation Light Touch: Appears Intact Coordination Gross Motor Movements are Fluid and Coordinated: No Fine Motor  Movements are Fluid and Coordinated: No Coordination and Movement Description: movements are coordinated but poor initiation and motor planning requiring ++ time to complete Heel Shin Test: unable to test Motor  Motor Motor: Other (comment) Motor - Skilled Clinical Observations: movements are coordinated but with poor initiation and motor planning requiring ++ time to complete   Trunk/Postural Assessment  Cervical Assessment Cervical Assessment: Exceptions to [facility] (neck flexion with forward head) Thoracic Assessment Thoracic Assessment: Exceptions to [facility] (rounded shoulders) Lumbar Assessment Lumbar Assessment: Within Functional Limits Postural Control Postural Control: Deficits on evaluation Righting Reactions: significantly delayed Protective Responses: significantly delayed/ diminished  Balance Balance Balance Assessed: Yes Static Sitting Balance Static Sitting - Balance Support: Bilateral upper extremity supported;Feet unsupported Static Sitting - Level of Assistance: Other (comment);5: Stand by assistance (CGA) Dynamic Sitting Balance Dynamic Sitting - Balance Support: Bilateral upper extremity supported;Feet supported Dynamic Sitting - Level of Assistance:  Other (comment) (CGA) Static Standing Balance Static Standing - Balance Support: No upper extremity supported Static Standing - Level of Assistance: 4: Min assist Dynamic Standing Balance Dynamic Standing - Balance Support: No upper extremity supported Dynamic Standing - Level of Assistance: 3: Mod assist;4: Min assist Extremity Assessment      RLE Assessment RLE Assessment: Exceptions to [facility] General Strength Comments: unable to MMT, functionally 4/5 LLE Assessment LLE Assessment: Exceptions to [facility] General Strength Comments: unable to MMT, functionally 4/5  Care Tool Care Tool Bed Mobility Roll left and right activity   Roll left and right assist level: Supervision/Verbal cueing    Sit to lying activity   Sit to lying assist level: Contact Guard/Touching assist    Lying to sitting on side of bed activity   Lying to sitting on side of bed assist level: the ability to move from lying on the back to sitting on the side of the bed with no back support.: Contact Guard/Touching assist     Care Tool Transfers Sit to stand transfer   Sit to stand assist level: Minimal Assistance - Patient > 75%    Chair/bed transfer   Chair/bed transfer assist level: Minimal Assistance - Patient > 75%    Car transfer   Car transfer assist level: Moderate Assistance - Patient 50 - 74%;Minimal Assistance - Patient > 75%      Care Tool Locomotion Ambulation   Assist level: Moderate Assistance - Patient 50 - 74% Assistive device: Hand held assist Max distance: 10 ft  Walk 10 feet activity   Assist level: Moderate Assistance - Patient - 50 - 74% Assistive device: Hand held assist;No Device   Walk 50 feet with 2 turns activity Walk 50 feet with 2 turns activity did not occur: Safety/medical concerns      Walk 150 feet activity Walk 150 feet activity did not occur: Safety/medical concerns      Walk 10 feet on uneven surfaces activity Walk 10 feet on uneven surfaces activity did not occur:  Safety/medical concerns      Stairs Stair activity did not occur: Safety/medical concerns        Walk up/down 1 step activity Walk up/down 1 step or curb (drop down) activity did not occur: Safety/medical concerns      Walk up/down 4 steps activity Walk up/down 4 steps activity did not occur: Safety/medical concerns      Walk up/down 12 steps activity Walk up/down 12 steps activity did not occur: Safety/medical concerns      Pick up small objects from floor Pick up small object from  the floor (from standing position) activity did not occur: Safety/medical concerns      Wheelchair Is the patient using a wheelchair?: Yes Type of Wheelchair: Manual   Wheelchair assist level: Maximal Assistance - Patient 25 - 49% Max wheelchair distance: 250 ft  Wheel 50 feet with 2 turns activity   Assist Level: Maximal Assistance - Patient 25 - 49%  Wheel 150 feet activity   Assist Level: Maximal Assistance - Patient 25 - 49%    Refer to Care Plan for Long Term Goals  SHORT TERM GOAL WEEK 1 PT Short Term Goal 1 (Week 1): Pt will perform bed mobility with overall SBA and improved initiation with no more than 2 vc. PT Short Term Goal 2 (Week 1): Pt will perform sit<>stand with overall SBA and improved initiation with no more than 2 vc. PT Short Term Goal 3 (Week 1): Pt will performstand pivot transfers with consistent CGA  and using RW with reduced vc for positioning. PT Short Term Goal 4 (Week 1): Pt will ambulate at least 75 ft using RW with consistent CGA. PT Short Term Goal 5 (Week 1): Pt will initiate step/ stair training.  Recommendations for other services: None   Skilled Therapeutic Intervention Mobility Bed Mobility Bed Mobility: Supine to Sit;Sit to Supine Supine to Sit: Minimal Assistance - Patient > 75% Sit to Supine: Contact Guard/Touching assist Transfers Transfers: Sit to Stand;Stand to Sit;Stand Pivot Transfers Sit to Stand: Minimal Assistance - Patient > 75% Stand to  Sit: Minimal Assistance - Patient > 75% Stand Pivot Transfers: Minimal Assistance - Patient > 75%;Moderate Assistance - Patient 50 - 74% Stand Pivot Transfer Details: Verbal cues for sequencing;Verbal cues for precautions/safety;Verbal cues for gait pattern;Verbal cues for safe use of DME/AE Transfer (Assistive device): Rolling walker Locomotion  Gait Ambulation: Yes Gait Distance (Feet): 20 Feet Assistive device: Rolling walker Gait Gait: Yes Gait Pattern: Impaired Gait Pattern: Step-through pattern;Decreased weight shift to left Gait velocity: Significantly decreased pace requiring vc for timing and to increase frequency of step Stairs / Additional Locomotion Stairs: No Wheelchair Mobility Wheelchair Mobility: No  Skilled Interventions: PT Evaluation completed; see above for results. PT educated patient in roles of PT vs OT, PT POC, rehab potential, rehab goals, and discharge recommendations along with recommendation for follow-up rehabilitation services. Individual treatment initiated:  Patient supine in bed and eating lunch upon PT arrival. Patient alert and agreeable to PT session.   No pain complaint during session.  Pt performs all mobility assessment as above but of note, pt requires significant amount of multimodal cueing in order to initiate all mobility.   Performs car transfer into simulator with overall MinA/ CGA with no AD. To leave car, pt provided with RW and ambulates 20 ft to mat table using RW with MinA for balance and walker mgmt.   Seated balance fair/ good while seated unsupported on EOM. Is able to reach for therapist's hands and can resist push/ pull and bring elbows to mat table on each side.   Standing balance challenged but pt requires Min/ modA to resist perturbations. Reaches out for RW to assist in maintaining balance despite continued education re: inability of RW to assist.  NMR performed for improvements in motor control and coordination, balance,  sequencing, judgement, and self confidence/ efficacy in performing all aspects of mobility at highest level of independence.   Patient supine in bed at end of session with brakes locked, bed alarm set, and all needs within reach. PTA updated on pt's  CLOF prior to next session.    Discharge Criteria: Patient will be discharged from PT if patient refuses treatment 3 consecutive times without medical reason, if treatment goals not met, if there is a change in medical status, if patient makes no progress towards goals or if patient is discharged from hospital.  The above assessment, treatment plan, treatment alternatives and goals were discussed and mutually agreed upon: by patient  [name] DELENA Milliner PT, DPT, CSRS 10/31/2024, 3:29 PM

## 2024-10-31 NOTE — Progress Notes (Signed)
 Inpatient Rehabilitation Discharge Medication Review by a Pharmacist  A complete drug regimen review was completed for this patient to identify any potential clinically significant medication issues.  High Risk Drug Classes Is patient taking? Indication by Medication  Antipsychotic Yes, as an intravenous medication Compazine  - nausea  Anticoagulant Yes Lovenox  -VTE prophylaxis  Antibiotic Yes, as an intravenous medication Acyclovir  IV - herpes encephalitis + NS IVF for hydration  Opioid No   Antiplatelet No   Hypoglycemics/insulin  No   Vasoactive Medication No   Chemotherapy No   Other Yes Acetaminophen - pain Albuterol , budesonide  nebs - COPD Bisacodyl , senna, miralax , fleet enema - bowel regimen /constipation Calcium , multivitamin - supplement-  Dexamethasone  - herpes encephalitis  (tapering off) Keppra  - seizures Melatonin - sleep Mirtazapine - depression Protonix - GERD     Type of Medication Issue Identified Description of Issue Recommendation(s)  Drug Interaction(s) (clinically significant)     Duplicate Therapy     Allergy     No Medication Administration End Date     Incorrect Dose     Additional Drug Therapy Needed     Significant med changes from prior encounter (inform family/care partners about these prior to discharge). PTA medications: Sertraline , esomeprazole, famotidine , ondansetron , and flonase  were not resumed. Restart PTA meds when and if necessary during CIR admission or at time of discharge, if warranted.   Communicate to patient /family/ caregiver prior to discharge.    Other       Clinically significant medication issues were identified that warrant physician communication and completion of prescribed/recommended actions by midnight of the next day:  No  Name of provider notified for urgent issues identified:   Provider Method of Notification:    Pharmacist comments:   Time spent performing this drug regimen review (minutes):  20   Levorn Gaskins, RPh Clinical Pharmacist 10/31/2024 1:01 PM

## 2024-10-31 NOTE — Progress Notes (Signed)
 Physical Therapy Note  Patient Details  Name: MIKIE MISNER MRN: [id number] Date of Birth: [birth year] Today's Date: 10/31/2024    Physical Therapist participated in the interdisciplinary team conference, providing clinical information regarding the patient's current status, treatment goals, and weekly focus, including any barriers that need to be addressed. Please see the Inpatient Rehabilitation Team Conference and Plan of Care Update for further details.   [name] DELENA Milliner PT, DPT, CSRS 10/31/2024, 12:58 PM

## 2024-10-31 NOTE — Progress Notes (Signed)
 PROGRESS NOTE   Subjective/Complaints:  Talks in a whisper, no c/os  ROS- limited by cognition and level of arousal   Objective:   No results found. Recent Labs    10/31/24 0514  WBC 10.4  HGB 13.5  HCT 38.6*  PLT 178   Recent Labs    10/29/24 0455 10/31/24 0514  NA 137 135  K 3.8 4.1  CL 103 103  CO2 22 23  GLUCOSE 89 111*  BUN 25* 24*  CREATININE 0.87 1.16  CALCIUM  8.3* 8.4*    Intake/Output Summary (Last 24 hours) at 10/31/2024 0931 Last data filed at 10/30/2024 2000 Gross per 24 hour  Intake 237 ml  Output --  Net 237 ml        Physical Exam: Vital Signs Blood pressure 91/61, pulse 65, temperature 97.7 F (36.5 C), temperature source Oral, resp. rate 19, height 5' 9 (1.753 m), weight 77.3 kg, SpO2 97%.   General: No acute distress Mood and affect are appropriate Heart: Regular rate and rhythm no rubs murmurs or extra sounds Lungs: Clear to auscultation, breathing unlabored, no rales or wheezes Abdomen: Positive bowel sounds, soft nontender to palpation, nondistended Extremities: No clubbing, cyanosis, or edema Skin: No evidence of breakdown, no evidence of rash Oriented to person and hospital , not rehab, not time  Hypophonia  Neurologic: Cranial nerves II through XII intact, motor strength is 5/5 in bilateral deltoid, bicep, tricep, grip, hip flexor, knee extensors, ankle dorsiflexor and plantar flexor Sensory exam normal sensation to light touch  in bilateral upper and lower extremities Cerebellar exam normal finger to nose to finger as well as heel to shin in bilateral upper and lower extremities Musculoskeletal: Full range of motion in all 4 extremities. No joint swelling   Assessment/Plan: 1. Functional deficits which require 3+ hours per day of interdisciplinary therapy in a comprehensive inpatient rehab setting. Physiatrist is providing close team supervision and 24 hour management  of active medical problems listed below. Physiatrist and rehab team continue to assess barriers to discharge/monitor patient progress toward functional and medical goals  Care Tool:  Bathing              Bathing assist       Upper Body Dressing/Undressing Upper body dressing        Upper body assist      Lower Body Dressing/Undressing Lower body dressing            Lower body assist       Toileting Toileting    Toileting assist       Transfers Chair/bed transfer  Transfers assist           Locomotion Ambulation   Ambulation assist              Walk 10 feet activity   Assist           Walk 50 feet activity   Assist           Walk 150 feet activity   Assist           Walk 10 feet on uneven surface  activity   Assist  Wheelchair     Assist               Wheelchair 50 feet with 2 turns activity    Assist            Wheelchair 150 feet activity     Assist          Blood pressure 91/61, pulse 65, temperature 97.7 F (36.5 C), temperature source Oral, resp. rate 19, height 5' 9 (1.753 m), weight 77.3 kg, SpO2 97%.  Medical Problem List and Plan: 1. 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, inferior frontal, and left temp lobe Cognitive deficits and balance issues, possibly with mild RUE weakness but will need to observe functionally              -patient may shower, if picc covered             -ELOS/Goals: 7-10 days, PT/OT/SLP mod I to sup             -admit to CIR 2.  Antithrombotics: -DVT/anticoagulation:  Pharmaceutical: Lovenox              -antiplatelet therapy:  3. Pain Management: tylenol  prn 4. Mood/Behavior/Sleep: LCSW to follow for evaluation and support.             --Melatonin prn for insomnia             -antipsychotic agents: N/A 5. Neuropsych/cognition: This patient is not fully capable of making  decisions on his own behalf. 6. Skin/Wound Care: Routine pressure relief  measures.  7. Fluids/Electrolytes/Nutrition: Monitor I/O. Check CMET in am. Monitor renal status closely 8.  Herpes encephalitis: Continue IV acyclovir  thru 11/07/24             --IVF for hydration while on ayclovir to prevent renal injury.             --on decadron  to be tapered off tomorrow.  9. Seizures: Continue Keppra  1000 mg bid 10. Pre-renal azotemia: Encourage fluid intake--may need to increase IVF- mild cont current IVF     Latest Ref Rng & Units 10/31/2024    5:14 AM 10/29/2024    4:55 AM 10/26/2024    5:20 AM  BMP  Glucose 70 - 99 mg/dL 888  89  92   BUN 8 - 23 mg/dL 24  25  22    Creatinine 0.61 - 1.24 mg/dL 8.83  9.12  9.22   Sodium 135 - 145 mmol/L 135  137  137   Potassium 3.5 - 5.1 mmol/L 4.1  3.8  3.8   Chloride 98 - 111 mmol/L 103  103  103   CO2 22 - 32 mmol/L 23  22  25    Calcium  8.9 - 10.3 mg/dL 8.4  8.3  8.3     11. H/o Depression: was on Zoloft  50 mg w/Remeron  7.5 mg daily  12.  Hypocalcemia: Boderline low with ionized Calcium  1.14. Caltrate added.  13.  Steroid-induced hyperglycemia:     Serum glucose ok at 111  14. GERD: continue PPI    LOS: 1 days A FACE TO FACE EVALUATION WAS PERFORMED  [name] 10/31/2024, 9:31 AM

## 2024-11-01 DIAGNOSIS — B004 Herpesviral encephalitis: Secondary | ICD-10-CM | POA: Diagnosis not present

## 2024-11-01 LAB — CBC
HCT: 39.1 % (ref 39.0–52.0)
Hemoglobin: 13.4 g/dL (ref 13.0–17.0)
MCH: 33.4 pg (ref 26.0–34.0)
MCHC: 34.3 g/dL (ref 30.0–36.0)
MCV: 97.5 fL (ref 80.0–100.0)
Platelets: 165 K/uL (ref 150–400)
RBC: 4.01 MIL/uL — ABNORMAL LOW (ref 4.22–5.81)
RDW: 15.4 % (ref 11.5–15.5)
WBC: 9.9 K/uL (ref 4.0–10.5)
nRBC: 0 % (ref 0.0–0.2)

## 2024-11-01 LAB — BASIC METABOLIC PANEL WITH GFR
Anion gap: 7 (ref 5–15)
BUN: 19 mg/dL (ref 8–23)
CO2: 25 mmol/L (ref 22–32)
Calcium: 8.3 mg/dL — ABNORMAL LOW (ref 8.9–10.3)
Chloride: 102 mmol/L (ref 98–111)
Creatinine, Ser: 0.94 mg/dL (ref 0.61–1.24)
GFR, Estimated: >60 mL/min (ref >60)
Glucose, Bld: 93 mg/dL (ref 70–99)
Potassium: 3.9 mmol/L (ref 3.5–5.1)
Sodium: 134 mmol/L — ABNORMAL LOW (ref 135–145)

## 2024-11-01 NOTE — Progress Notes (Signed)
 PROGRESS NOTE   Subjective/Complaints:  Pt reports no issues Denies constipation, but doesn't know when had a BM last  Ate 80-90% of tray.   Speaks in a very quiet whisper  ROS- limited by sleepiness  Objective:   No results found. Recent Labs    10/31/24 0514 11/01/24 0405  WBC 10.4 9.9  HGB 13.5 13.4  HCT 38.6* 39.1  PLT 178 165   Recent Labs    10/31/24 0514 11/01/24 0405  NA 135 134*  K 4.1 3.9  CL 103 102  CO2 23 25  GLUCOSE 111* 93  BUN 24* 19  CREATININE 1.16 0.94  CALCIUM  8.4* 8.3*    Intake/Output Summary (Last 24 hours) at 11/01/2024 0912 Last data filed at 10/31/2024 1838 Gross per 24 hour  Intake 805 ml  Output --  Net 805 ml        Physical Exam: Vital Signs Blood pressure 96/65, pulse 63, temperature 98.7 F (37.1 C), temperature source Oral, resp. rate 18, height 5' 9 (1.753 m), weight 77.3 kg, SpO2 100%.     General: awake, alert, finished 80-90% of tray; sitting up in bed; sleepy; NAD HENT: conjugate gaze; oropharynx  a little dry CV: regular rate and rhythm; no JVD Pulmonary: CTA B/L; no W/R/R- good air movement GI: soft, NT, ND, (+)BS- normoactive Psychiatric: flat, quiet Neurological: Ox2- voice hard to hear due to quiet volume Oriented to person and hospital , not rehab, not time  Hypophonia  Neurologic: Cranial nerves II through XII intact, motor strength is 5/5 in bilateral deltoid, bicep, tricep, grip, hip flexor, knee extensors, ankle dorsiflexor and plantar flexor Sensory exam normal sensation to light touch  in bilateral upper and lower extremities Cerebellar exam normal finger to nose to finger as well as heel to shin in bilateral upper and lower extremities Musculoskeletal: Full range of motion in all 4 extremities. No joint swelling   Assessment/Plan: 1. Functional deficits which require 3+ hours per day of interdisciplinary therapy in a comprehensive  inpatient rehab setting. Physiatrist is providing close team supervision and 24 hour management of active medical problems listed below. Physiatrist and rehab team continue to assess barriers to discharge/monitor patient progress toward functional and medical goals  Care Tool:  Bathing    Body parts bathed by patient: Right arm, Left arm, Chest, Abdomen, Face   Body parts bathed by helper: Front perineal area, Buttocks, Right upper leg, Left upper leg, Right lower leg, Left lower leg     Bathing assist Assist Level: Moderate Assistance - Patient 50 - 74%     Upper Body Dressing/Undressing Upper body dressing   What is the patient wearing?: Hospital gown only    Upper body assist Assist Level: Moderate Assistance - Patient 50 - 74%    Lower Body Dressing/Undressing Lower body dressing      What is the patient wearing?: Pants, Incontinence brief     Lower body assist Assist for lower body dressing: Dependent - Patient 0%     Toileting Toileting    Toileting assist Assist for toileting: Dependent - Patient 0%     Transfers Chair/bed transfer  Transfers assist     Chair/bed  transfer assist level: Minimal Assistance - Patient > 75%     Locomotion Ambulation   Ambulation assist      Assist level: Moderate Assistance - Patient 50 - 74% Assistive device: Hand held assist Max distance: 10 ft   Walk 10 feet activity   Assist     Assist level: Moderate Assistance - Patient - 50 - 74% Assistive device: Hand held assist, No Device   Walk 50 feet activity   Assist Walk 50 feet with 2 turns activity did not occur: Safety/medical concerns         Walk 150 feet activity   Assist Walk 150 feet activity did not occur: Safety/medical concerns         Walk 10 feet on uneven surface  activity   Assist Walk 10 feet on uneven surfaces activity did not occur: Safety/medical concerns         Wheelchair     Assist Is the patient using a  wheelchair?: Yes Type of Wheelchair: Manual    Wheelchair assist level: Maximal Assistance - Patient 25 - 49% Max wheelchair distance: 250 ft    Wheelchair 50 feet with 2 turns activity    Assist        Assist Level: Maximal Assistance - Patient 25 - 49%   Wheelchair 150 feet activity     Assist      Assist Level: Maximal Assistance - Patient 25 - 49%   Blood pressure 96/65, pulse 63, temperature 98.7 F (37.1 C), temperature source Oral, resp. rate 18, height 5' 9 (1.753 m), weight 77.3 kg, SpO2 100%.  Medical Problem List and Plan: 1. 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  Antithrombotics: -DVT/anticoagulation:  Pharmaceutical: Lovenox              -antiplatelet therapy:  3. Pain Management: tylenol  prn 4. Mood/Behavior/Sleep: LCSW to follow for evaluation and support.             --Melatonin prn for insomnia             -antipsychotic agents: N/A 5. Neuropsych/cognition: This patient is not fully capable of making decisions on his own behalf. 6. Skin/Wound Care: Routine pressure relief  measures.  7. Fluids/Electrolytes/Nutrition: Monitor I/O. Check CMET in am. Monitor renal status closely 8.  Herpes encephalitis: Continue IV acyclovir  thru 11/07/24             --IVF for hydration while on ayclovir to prevent renal injury.             --on decadron  to be tapered off tomorrow.  9. Seizures: Continue Keppra  1000 mg bid 10. Pre-renal azotemia: Encourage fluid intake--may need to increase IVF- mild cont current IVF   11/27- Renal function looking better    Latest Ref Rng & Units 11/01/2024     4:05 AM 10/31/2024    5:14 AM 10/29/2024    4:55 AM  BMP  Glucose 70 - 99 mg/dL 93  888  89  BUN 8 - 23 mg/dL 19  24  25    Creatinine 0.61 - 1.24 mg/dL 9.05  8.83  9.12   Sodium 135 - 145 mmol/L 134  135  137   Potassium 3.5 - 5.1 mmol/L 3.9  4.1  3.8   Chloride 98 - 111 mmol/L 102  103  103   CO2 22 - 32 mmol/L 25  23  22    Calcium  8.9 - 10.3 mg/dL 8.3  8.4  8.3     11. H/o Depression: was on Zoloft  50 mg w/Remeron  7.5 mg daily  12.  Hypocalcemia: Boderline low with ionized Calcium  1.14. Caltrate added.  13.  Steroid-induced hyperglycemia:     Serum glucose ok at 111  14. GERD: continue PPI  15. Hypophonia  11/27- Hopefully SLP can help with this 16. Constipation  11/27- LBM yesterday   I spent a total of 36   minutes on total care today- >50% coordination of care- due to  Review of labs, vitals and B/B in/outs- and d/w nursing about pt   LOS: 2 days A FACE TO FACE EVALUATION WAS PERFORMED  Drevon Plog 11/01/2024, 9:12 AM

## 2024-11-02 DIAGNOSIS — B004 Herpesviral encephalitis: Secondary | ICD-10-CM | POA: Diagnosis not present

## 2024-11-02 DIAGNOSIS — K5900 Constipation, unspecified: Secondary | ICD-10-CM

## 2024-11-02 MED ORDER — POLYETHYLENE GLYCOL 3350 17 G PO PACK
17.0000 g | PACK | Freq: Every day | ORAL | Status: DC
  Administered 2024-11-02 – 2024-11-15 (×14): 17 g via ORAL
  Filled 2024-11-02 (×14): qty 1

## 2024-11-02 MED ORDER — ORAL CARE MOUTH RINSE: 15.0000 mL | OROMUCOSAL | Status: DC | PRN

## 2024-11-02 NOTE — Progress Notes (Addendum)
 PROGRESS NOTE   Subjective/Complaints:  No new complaints or concerns.  Reports slept okay last night.  Denies pain.  Very quiet voice-continued  ROS-denies chest pain, shortness of breath, abdominal pain, nausea  Objective:   No results found. Recent Labs    10/31/24 0514 11/01/24 0405  WBC 10.4 9.9  HGB 13.5 13.4  HCT 38.6* 39.1  PLT 178 165   Recent Labs    10/31/24 0514 11/01/24 0405  NA 135 134*  K 4.1 3.9  CL 103 102  CO2 23 25  GLUCOSE 111* 93  BUN 24* 19  CREATININE 1.16 0.94  CALCIUM  8.4* 8.3*    Intake/Output Summary (Last 24 hours) at 11/02/2024 1419 Last data filed at 11/02/2024 0700 Gross per 24 hour  Intake 1431.26 ml  Output --  Net 1431.26 ml        Physical Exam: Vital Signs Blood pressure 97/69, pulse 61, temperature 97.9 F (36.6 C), temperature source Oral, resp. rate 18, height 5' 9 (1.753 m), weight 77.3 kg, SpO2 98%.     General: awake, alert, NAD HENT: conjugate gaze; oropharynx  a little dry CV: regular rate and rhythm; no JVD Pulmonary: CTA B/L; no W/R/R-nonlabored breathing GI: soft, NT, ND, (+)BS- normoactive Psychiatric: flat, quiet  Neurologic: Alert and awake, very quiet voice hypophonia , cranial nerves II through XII intact, motor moving all 4 extremities to gravity and resistance Sensory exam normal sensation to light touch  in bilateral upper and lower extremities  Musculoskeletal: No joint swelling noted   Assessment/Plan: 1. Functional deficits which require 3+ hours per day of interdisciplinary therapy in a comprehensive inpatient rehab setting. Physiatrist is providing close team supervision and 24 hour management of active medical problems listed below. Physiatrist and rehab team continue to assess barriers to discharge/monitor patient progress toward functional and medical goals  Care Tool:  Bathing    Body parts bathed by patient: Right arm,  Left arm, Chest, Abdomen, Face   Body parts bathed by helper: Front perineal area, Buttocks, Right upper leg, Left upper leg, Right lower leg, Left lower leg     Bathing assist Assist Level: Moderate Assistance - Patient 50 - 74%     Upper Body Dressing/Undressing Upper body dressing   What is the patient wearing?: Hospital gown only    Upper body assist Assist Level: Moderate Assistance - Patient 50 - 74%    Lower Body Dressing/Undressing Lower body dressing      What is the patient wearing?: Pants, Incontinence brief     Lower body assist Assist for lower body dressing: Dependent - Patient 0%     Toileting Toileting    Toileting assist Assist for toileting: Dependent - Patient 0%     Transfers Chair/bed transfer  Transfers assist     Chair/bed transfer assist level: Minimal Assistance - Patient > 75%     Locomotion Ambulation   Ambulation assist      Assist level: Moderate Assistance - Patient 50 - 74% Assistive device: Hand held assist Max distance: 10 ft   Walk 10 feet activity   Assist     Assist level: Moderate Assistance - Patient - 50 - 74%  Assistive device: Hand held assist, No Device   Walk 50 feet activity   Assist Walk 50 feet with 2 turns activity did not occur: Safety/medical concerns         Walk 150 feet activity   Assist Walk 150 feet activity did not occur: Safety/medical concerns         Walk 10 feet on uneven surface  activity   Assist Walk 10 feet on uneven surfaces activity did not occur: Safety/medical concerns         Wheelchair     Assist Is the patient using a wheelchair?: Yes Type of Wheelchair: Manual    Wheelchair assist level: Maximal Assistance - Patient 25 - 49% Max wheelchair distance: 250 ft    Wheelchair 50 feet with 2 turns activity    Assist        Assist Level: Maximal Assistance - Patient 25 - 49%   Wheelchair 150 feet activity     Assist      Assist Level:  Maximal Assistance - Patient 25 - 49%   Blood pressure 97/69, pulse 61, temperature 97.9 F (36.6 C), temperature source Oral, resp. rate 18, height 5' 9 (1.753 m), weight 77.3 kg, SpO2 98%.  Medical Problem List and Plan: 1. 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  Antithrombotics: -DVT/anticoagulation:  Pharmaceutical: Lovenox              -antiplatelet therapy:  3. Pain Management: tylenol  prn 4. Mood/Behavior/Sleep: LCSW to follow for evaluation and support.             --Melatonin prn for insomnia             -antipsychotic agents: N/A 5. Neuropsych/cognition: This patient is not fully capable of making decisions on his own behalf. 6. Skin/Wound Care: Routine pressure relief  measures.  7. Fluids/Electrolytes/Nutrition: Monitor I/O. Check CMET in am. Monitor renal status closely 8.  Herpes encephalitis: Continue IV acyclovir  thru 11/07/24             --IVF for hydration while on ayclovir to prevent renal injury.             --was on decadron  -tapered off  9. Seizures: Continue Keppra  1000 mg bid 10. Pre-renal azotemia: Encourage fluid intake--may need to increase IVF- mild cont current IVF   11/27- Renal function looking better    Latest Ref Rng & Units 11/01/2024    4:05 AM 10/31/2024    5:14 AM 10/29/2024    4:55 AM  BMP  Glucose 70 - 99 mg/dL 93  888  89   BUN 8 - 23 mg/dL 19  24  25    Creatinine 0.61 - 1.24 mg/dL 9.05  8.83  9.12   Sodium 135 - 145 mmol/L 134  135  137   Potassium 3.5 - 5.1 mmol/L 3.9  4.1  3.8   Chloride 98 - 111 mmol/L 102  103  103   CO2 22 - 32 mmol/L 25  23  22   Calcium  8.9 - 10.3 mg/dL 8.3  8.4   8.3     11. H/o Depression: was on Zoloft  50 mg w/Remeron  7.5 mg daily  12.  Hypocalcemia: Boderline low with ionized Calcium  1.14. Caltrate added.  13.  Steroid-induced hyperglycemia:     Serum glucose ok at 111  14. GERD: continue PPI  15. Hypophonia  11/27- Hopefully SLP can help with this 16. Constipation  11/27- LBM yesterday  11/28 scheduled MiraLAX   3 days A FACE TO FACE EVALUATION WAS PERFORMED  Murray Collier 11/02/2024, 2:19 PM

## 2024-11-02 NOTE — Progress Notes (Signed)
 Occupational Therapy Session Note  Patient Details  Name: [name] MRN: [id number] Date of Birth: 1960-12-28  Today's Date: 11/02/2024 OT Individual Time: 8581-8541 OT Individual Time Calculation (min): 40 min    Short Term Goals: Week 1:  OT Short Term Goal 1 (Week 1): Pt will perform LB dressing and bathing with Mod A OT Short Term Goal 2 (Week 1): Pt will complete toileting with Mod A.  Skilled Therapeutic Interventions/Progress Updates:    Pt received sitting in recliner, deeply sleeping and slumped over to the right. Pt requiring increased time to wake and initiate participation in therapy session, receptive to skilled OT session reporting 0/10 pain- OT offering intermittent rest breaks, repositioning, and therapeutic support to optimize participation in therapy session. Focused this session on activity tolerance, functional transfer training, and functional cognition. Pt completed short distance functional mobility to wc using RW with MIN A for RW management, verbal cues for safety/sequencing, and MOD verbal cues for hand placement during sit<>stands- significantly increased time required for initiation of transfer, benefits from 1,2,3,stand method. Positioned Pt at sink for familiar ADLs with Pt requiring increased time to brush hair, brush teeth, and wash face with MAX questioning cues to begin next step of ADL. Door closed and TV off to minimize distractions. Pt disoriented to location and reason for hospitalization. Pt falling asleep at end of session. Engaged Pt in completing functional mobility back to bed ~6 ft using RW with same level of assist required as previous. [name] > supine MIN A to lift B LEs into bed +increased time and MAX verbal cues for initiation. Pt was left resting in bed with call bell in reach, bed alarm on, and all needs met.    Therapy Documentation Precautions:  Precautions Precautions: Fall Recall of Precautions/Restrictions:  Impaired Precaution/Restrictions Comments: R weakness, IV abx through 12/3, urinary incontinence Restrictions Weight Bearing Restrictions Per Provider Order: No   Therapy/Group: Individual Therapy  Katheryn SHAUNNA [place] 11/02/2024, 2:54 PM

## 2024-11-02 NOTE — IPOC Note (Signed)
 Overall Plan of Care [facility]) Patient Details Name: [name] MRN: [id number] DOB: July 25, 1961  Admitting Diagnosis: Herpesviral encephalitis  Hospital Problems: Principal Problem:   Herpesviral encephalitis     Functional Problem List: Nursing Bowel, Bladder, Safety, Endurance, Medication Management, Skin Integrity  PT Balance, Edema, Endurance, Motor, Nutrition, Pain, Perception, Safety, Sensory, Skin Integrity  OT Balance, Cognition, Endurance, Motor, Safety  SLP Cognition, Safety, Perception, Linguistic  TR         Basic ADL's: OT Grooming, Bathing, Dressing, Toileting     Advanced  ADL's: OT       Transfers: PT Bed Mobility, Bed to Chair, Car, Occupational Psychologist, Research Scientist (life Sciences): PT Ambulation, Stairs     Additional Impairments: OT    SLP Swallowing, Communication, Social Cognition comprehension, expression Problem Solving, Social Interaction, Memory, Attention  TR      Anticipated Outcomes Item Anticipated Outcome  Self Feeding    Swallowing  ModI   Basic self-care  SBA  Toileting  SBA   Bathroom Transfers SBA  Bowel/Bladder  manage bowel and bladder w mod I assist  Transfers  Mod I  Locomotion  supervision  Communication  Supervision  Cognition  Supervision  Pain  Pain < 4 with prns  Safety/Judgment  manage safety w cues   Therapy Plan: PT Intensity: Minimum of 1-2 x/day ,45 to 90 minutes PT Frequency: 5 out of 7 days PT Duration Estimated Length of Stay: 18-21 days OT Intensity: Minimum of 1-2 x/day, 45 to 90 minutes OT Frequency: 5 out of 7 days OT Duration/Estimated Length of Stay: 7-10 days SLP Intensity: Minumum of 1-2 x/day, 30 to 90 minutes SLP Frequency: 3 to 5 out of 7 days SLP Duration/Estimated Length of Stay: 7-10 days   Team Interventions: Nursing Interventions Patient/Family Education, Medication Management, Discharge Planning, Bowel Management, Bladder Management, Disease Management/Prevention,  Cognitive Remediation/Compensation, Skin Care/Wound Management  PT interventions Ambulation/gait training, Community reintegration, DME/adaptive equipment instruction, Neuromuscular re-education, Psychosocial support, Stair training, UE/LE Strength taining/ROM, Warden/ranger, Discharge planning, Therapeutic Activities, UE/LE Coordination activities, Cognitive remediation/compensation, Disease management/prevention, Functional mobility training, Patient/family education, Splinting/orthotics, Therapeutic Exercise, Visual/perceptual remediation/compensation  OT Interventions Balance/vestibular training, Neuromuscular re-education, Self Care/advanced ADL retraining, Therapeutic Exercise, Wheelchair propulsion/positioning, UE/LE Strength taining/ROM, DME/adaptive equipment instruction, Cognitive remediation/compensation, Firefighter, Equities Trader education, UE/LE Coordination activities, Therapeutic Activities, Discharge planning  SLP Interventions Cognitive remediation/compensation, Financial trader, Functional tasks, Patient/family education, Oral motor exercises, Therapeutic Activities, Speech/Language facilitation, Internal/external aids, Dysphagia/aspiration precaution training  TR Interventions    SW/CM Interventions Discharge Planning, Psychosocial Support, Patient/Family Education   Barriers to Discharge MD  Medical stability and Behavior  Nursing Decreased caregiver support 1 level ramped entry w spouse  PT Decreased caregiver support, IV antibiotics, Incontinence, Wound Care, Lack of/limited family support, Insurance for SNF coverage    OT Incontinence    SLP      SW Insurance for SNF coverage     Team Discharge Planning: Destination: PT-Home ,OT- Home , SLP-Home Projected Follow-up: PT-Home health PT, 24 hour supervision/assistance, OT-  Outpatient OT, SLP-Outpatient SLP, Home Health SLP, 24 hour supervision/assistance Projected Equipment Needs: PT-To be  determined, OT- To be determined, SLP-None recommended by SLP Equipment Details: PT- , OT-owns: BSC, SPC, RW Patient/family involved in discharge planning: PT- Patient,  OT-Patient , SLP-Patient  MD ELOS: 7-10  Medical Rehab Prognosis:  Excellent Assessment: The patient has been admitted for CIR therapies with the diagnosis of HSV encephalitis . The team will be addressing functional mobility,  strength, stamina, balance, safety, adaptive techniques and equipment, self-care, bowel and bladder mgt, patient and caregiver education. Goals have been set at sup. Anticipated discharge destination is home.        See Team Conference Notes for weekly updates to the plan of care

## 2024-11-02 NOTE — Plan of Care (Signed)
  Problem: Consults Goal: RH BRAIN INJURY PATIENT EDUCATION Description: Description: See Patient Education module for eduction specifics Outcome: Progressing Goal: Nutrition Consult-if indicated Outcome: Progressing   Problem: RH SKIN INTEGRITY Goal: RH STG SKIN FREE OF INFECTION/BREAKDOWN Description: Manage w min assist Outcome: Progressing Goal: RH STG MAINTAIN SKIN INTEGRITY WITH ASSISTANCE Description: STG Maintain Skin Integrity With min Assistance. Outcome: Progressing Goal: RH STG ABLE TO PERFORM INCISION/WOUND CARE W/ASSISTANCE Description: STG Able To Perform Incision/Wound Care With min Assistance. Outcome: Progressing

## 2024-11-02 NOTE — Progress Notes (Signed)
 Physical Therapy Session Note  Patient Details  Name: [name] MRN: [id number] Date of Birth: [birth year]  Today's Date: 11/02/2024 PT Individual Time: 0908-1005 PT Individual Time Calculation (min): 57 min   Short Term Goals: Week 1:  PT Short Term Goal 1 (Week 1): Pt will perform bed mobility with overall SBA and improved initiation with no more than 2 vc. PT Short Term Goal 2 (Week 1): Pt will perform sit<>stand with overall SBA and improved initiation with no more than 2 vc. PT Short Term Goal 3 (Week 1): Pt will performstand pivot transfers with consistent CGA  and using RW with reduced vc for positioning. PT Short Term Goal 4 (Week 1): Pt will ambulate at least 75 ft using RW with consistent CGA. PT Short Term Goal 5 (Week 1): Pt will initiate step/ stair training.  Skilled Therapeutic Interventions/Progress Updates:  Patient supine in bed with HOB elevated  on entrance to room. Patient alert and agreeable to PT session.   Patient with no pain complaint at start of session.  Therapeutic Activity: Bed Mobility: Pt performed supine > sit with increased time and encouragement and overall Min/ ModA.  VC/ tc required for technique and initiation. Transfers: Pt performed sit<>stand and stand pivot transfers throughout session with MinA for balance and to overcome posterior bias. Provided vc/ tc for technique performing rise-to-stand, as well as descent-to-sit safely.  Gait Training:  Pt guided in stair training with physical demonstration and verbal instructions provided prior to performance. Pt is able to complete four 6 steps using BHR with CGA to ascend leading with RLE - requires significant pull at [facility] in order to ascend. Descends leading with RLE initially but unable to fully clear R heel effectively with mild buckle of L knee intermittently. Improved quality of descent when leading with LLE.  VC provided at initiation of each direction for leading LE. Very slow step  progression.   Patient seated upright in recliner with BLE elevated at end of session with brakes locked, seat pad alarm ready to be set by SLP/ NT, and all needs within reach. IV pole managed by PT entire session.    Therapy Documentation Precautions:  Precautions Precautions: Fall Recall of Precautions/Restrictions: Impaired Precaution/Restrictions Comments: urinary incontinence, orthostatic hypotension 11/24 Restrictions Weight Bearing Restrictions Per Provider Order: No  Pain:  No pain related this session.    Therapy/Group: Individual Therapy  [name] PT, DPT, CSRS 11/02/2024, 7:57 AM

## 2024-11-02 NOTE — Progress Notes (Signed)
 Speech Language Pathology Daily Session Note  Patient Details  Name: [name] MRN: [id number] Date of Birth: January 22, 1961  Today's Date: 11/02/2024 SLP Individual Time: 1103-1200 SLP Individual Time Calculation (min): 57 min  Short Term Goals: Week 1: SLP Short Term Goal 1 (Week 1): Pt will demonstrated improved oral containment with cup sups of thin liquids and reduce or eliminate anterior loss in 80% of opportunities. SLP Short Term Goal 2 (Week 1): Pt will complete simple problem solving tasks with min verbal cues with >50% accuracy. SLP Short Term Goal 3 (Week 1): Pt will follow multi-step direction with min verbal cues with >50% accuracy. SLP Short Term Goal 4 (Week 1): Pt will attend to structured tasks for >5 minutes with min verbal cues. SLP Short Term Goal 5 (Week 1): Pt will participate in education regarding voice and vocal hygiene with min cues.  Skilled Therapeutic Interventions: Skilled therapy session focused on cognitive goals. SLP facilitated session by targeting orientation goals. Patient oriented to name, however disoriented to location (stating [place]), situation, or time. SLP re-educated and provided handout to assist in orientation. Patient required maxA to utilize calendar to locate today and name recent holiday. SLP then targeted problem solving skills through prompting patient to identify and recall use of call bell. Patient required maxA to identify button and maxA to recall reasons to contact nursing. Lastly, SLP targeted simple money management by having patient count coins according to verbalized amounts. Patient independently sorted change into amounts, however required maxA to count. Patient left in chair with call bell in reach. Continue POC.   Pain None reported   Therapy/Group: Individual Therapy  Gladyce Mcray M.A., CCC-SLP 11/02/2024, 7:42 AM

## 2024-11-02 NOTE — Progress Notes (Signed)
 Physical Therapy Session Note  Patient Details  Name: [name] MRN: [id number] Date of Birth: August 23, 1961  Today's Date: 11/02/2024 PT Individual Time: 0908-1005 PT Individual Time Calculation (min): 57 min   Short Term Goals: Week 1:  PT Short Term Goal 1 (Week 1): Pt will perform bed mobility with overall SBA and improved initiation with no more than 2 vc. PT Short Term Goal 2 (Week 1): Pt will perform sit<>stand with overall SBA and improved initiation with no more than 2 vc. PT Short Term Goal 3 (Week 1): Pt will performstand pivot transfers with consistent CGA  and using RW with reduced vc for positioning. PT Short Term Goal 4 (Week 1): Pt will ambulate at least 75 ft using RW with consistent CGA. PT Short Term Goal 5 (Week 1): Pt will initiate step/ stair training.  Skilled Therapeutic Interventions/Progress Updates:  Patient supine in bed with HOB elevated and watching TV on entrance to room. Patient alert and agreeable to PT session. Voice stronger initially on entrance to room but then returns to whisper throughout session. IV abx running.   Patient with no pain complaint at start of session.   No personal clothing present in room.   Therapeutic Activity: Bed Mobility: Pt encouraged to sit up and agrees, however takes significant and consistent multimodal cueing to initiate bringing BLE off [name] and scooting to [name]. All requiring extra time d/t decreased processing. Performs with overall CGA/ MinA with RUE HHA. Sits [name] with good balance.   Transfers: Pt performed sit<>stand and stand pivot transfers throughout session with CGA/ MinA to RW or other LUE HHA. Provided vc/ tc for forward lean and 1-2-3 countdown for improved initiation to stand.   Toilet transfer with use of RW and CGA as well as Mod/ MaxA for clothing mgmt. Sits at toilet with good balance and continent of b/b. Dressing at toilet with new brief and paper pants donned with MaxA. New gown donned at [facility].    Gait Training:  Pt ambulated 12 ft x2 using RW with CGA/ MinA for balance and requiring gentle lateral weight shift at pelvis in order to facilitate improved pace. Also noted intermittent posterior bias in stance requiring brief MinA and repositioning of RW further in front of pt for improved balance. Provided vc/ tc for technique and increased speed of initiation throughout.  Patient seated upright in w/c at end of session with brakes locked, belt alarm set, and all needs within reach. Pt falling asleep in w/c prior to leaving room.    Therapy Documentation Precautions:  Precautions Precautions: Fall Recall of Precautions/Restrictions: Impaired Precaution/Restrictions Comments: R weakness, IV abx through 12/3, urinary incontinence Restrictions Weight Bearing Restrictions Per Provider Order: No  Pain:  No pain related this session.   Therapy/Group: Individual Therapy  [name] DELENA Milliner PT, DPT, CSRS 11/02/2024, 7:53 AM

## 2024-11-03 DIAGNOSIS — R630 Anorexia: Secondary | ICD-10-CM | POA: Diagnosis not present

## 2024-11-03 DIAGNOSIS — R7989 Other specified abnormal findings of blood chemistry: Secondary | ICD-10-CM | POA: Diagnosis not present

## 2024-11-03 DIAGNOSIS — K5901 Slow transit constipation: Secondary | ICD-10-CM

## 2024-11-03 DIAGNOSIS — B004 Herpesviral encephalitis: Secondary | ICD-10-CM | POA: Diagnosis not present

## 2024-11-03 LAB — BASIC METABOLIC PANEL WITH GFR
Anion gap: 9 (ref 5–15)
BUN: 14 mg/dL (ref 8–23)
CO2: 24 mmol/L (ref 22–32)
Calcium: 8.4 mg/dL — ABNORMAL LOW (ref 8.9–10.3)
Chloride: 103 mmol/L (ref 98–111)
Creatinine, Ser: 0.85 mg/dL (ref 0.61–1.24)
GFR, Estimated: >60 mL/min (ref >60)
Glucose, Bld: 95 mg/dL (ref 70–99)
Potassium: 3.6 mmol/L (ref 3.5–5.1)
Sodium: 136 mmol/L (ref 135–145)

## 2024-11-03 MED ORDER — CHLORHEXIDINE GLUCONATE CLOTH 2 % EX PADS
6.0000 | MEDICATED_PAD | Freq: Two times a day (BID) | CUTANEOUS | Status: DC
  Administered 2024-11-03 – 2024-11-13 (×19): 6 via TOPICAL

## 2024-11-03 NOTE — Progress Notes (Signed)
 Occupational Therapy Session Note  Patient Details  Name: [name] MRN: [id number] Date of Birth: 25-Sep-1961  Today's Date: 11/03/2024 OT Individual Time: 8969-8876 OT Individual Time Calculation (min): 53 min    Short Term Goals: Week 1:  OT Short Term Goal 1 (Week 1): Pt will perform LB dressing and bathing with Mod A OT Short Term Goal 2 (Week 1): Pt will complete toileting with Mod A.  Skilled Therapeutic Interventions/Progress Updates:     Initial Encounter: The patient was seated in the recliner at the time of arrival. Patient went on to work on  BADL related task in showering . The pt had no pain to report at the onset of treatment.    BADL in Showering: The pt was able to come from seated in the recliner to standing incorporating the RW with MinA for transferring to the w/c.  The pt was transported to the shower and was able to doff his LB clothing with ModA and MinA for his over head shirt. The pt was ModA for his non-skid socks. The pt was able to bathe his UB with constant vc's at [facility], he was MaxA for BLE inclusive of peri care. The pt was able to dry off and apply deo with ModA. The pt was ModA for donning his over head shirt and MaxA with his brief and pants. The pt was ModA with his non-skid socks. The pt was able to transfer to the recliner with MinA using the RW and the arm of the recliner. At the end of the session, the call light and bedside table were placed within reach with nursing available to address all additional needs.  Therapy Documentation Precautions:  Precautions Precautions: Fall Recall of Precautions/Restrictions: Impaired Precaution/Restrictions Comments: R weakness, IV abx through 12/3, urinary incontinence Restrictions Weight Bearing Restrictions Per Provider Order: No  Therapy/Group: Individual Therapy  Elvera JONETTA Mace 11/03/2024, 4:12 PM

## 2024-11-03 NOTE — Progress Notes (Signed)
 Occupational Therapy Session Note  Patient Details  Name: [name] MRN: [id number] Date of Birth: [birth year]  Today's Date: 11/03/2024 OT Individual Time: 0100-0145 OT Individual Time Calculation (min): 45 min    Short Term Goals: Week 1:  OT Short Term Goal 1 (Week 1): Pt will perform LB dressing and bathing with Mod A OT Short Term Goal 2 (Week 1): Pt will complete toileting with Mod A.  Skilled Therapeutic Interventions/Progress Updates:   Initial Encounter:  Patient seated in the recliner at the time of arrival with family present.  The pt was in agreement with completing UB strengthing to improve his core strength for gain with BADL related task performance and execution of simulated task in dressing for LB/UB.   BADL Task Performance: The pt was able to simulate UB dressing using theraband 3x's.  The pt was able to  donn/ doff the theraband at [facility] with vc's for threading his head and arms through the theraband . The pt was able to simulated LB dressing using theraband 3x with MinA.  The pt required 3 rest breaks. The pt went on to complete the UB cycling while seated upright in the recliner with 2 rest breaks.  .At the end of the session, the pt remained in the recliner with the call light and bed side table within reach and all additional needs addressed.   Therapy Documentation Precautions:  Precautions Precautions: Fall Recall of Precautions/Restrictions: Impaired Precaution/Restrictions Comments: R weakness, IV abx through 12/3, urinary incontinence Restrictions Weight Bearing Restrictions Per Provider Order: No  Therapy/Group: Individual Therapy  Elvera JONETTA Mace 11/03/2024, 4:15 PM

## 2024-11-03 NOTE — Plan of Care (Signed)
  Problem: Consults Goal: RH BRAIN INJURY PATIENT EDUCATION Description: Description: See Patient Education module for eduction specifics Outcome: Progressing Goal: Nutrition Consult-if indicated Outcome: Progressing   Problem: RH BOWEL ELIMINATION Goal: RH STG MANAGE BOWEL WITH ASSISTANCE Description: STG Manage Bowel with mod I Assistance. Outcome: Progressing Goal: RH STG MANAGE BOWEL W/MEDICATION W/ASSISTANCE Description: STG Manage Bowel with Medication with mod I Assistance. Outcome: Progressing   Problem: RH BLADDER ELIMINATION Goal: RH STG MANAGE BLADDER WITH ASSISTANCE Description: STG Manage Bladder With toileting Assistance Outcome: Progressing   Problem: RH SKIN INTEGRITY Goal: RH STG SKIN FREE OF INFECTION/BREAKDOWN Description: Manage w min assist Outcome: Progressing Goal: RH STG MAINTAIN SKIN INTEGRITY WITH ASSISTANCE Description: STG Maintain Skin Integrity With min Assistance. Outcome: Progressing Goal: RH STG ABLE TO PERFORM INCISION/WOUND CARE W/ASSISTANCE Description: STG Able To Perform Incision/Wound Care With min Assistance. Outcome: Progressing   Problem: RH SAFETY Goal: RH STG ADHERE TO SAFETY PRECAUTIONS W/ASSISTANCE/DEVICE Description: STG Adhere to Safety Precautions With  cues Assistance/Device. Outcome: Progressing   Problem: RH COGNITION-NURSING Goal: RH STG USES MEMORY AIDS/STRATEGIES W/ASSIST TO PROBLEM SOLVE Description: STG Uses Memory Aids/Strategies With cues Assistance to Problem Solve. Outcome: Progressing   Problem: RH KNOWLEDGE DEFICIT BRAIN INJURY Goal: RH STG INCREASE KNOWLEDGE OF SELF CARE AFTER BRAIN INJURY Description: Patient and spouse will be able to manage care at discharge using educational resources independently Outcome: Progressing

## 2024-11-03 NOTE — Progress Notes (Signed)
 Physical Therapy Session Note  Patient Details  Name: [name] MRN: [id number] Date of Birth: 05-17-1961  Today's Date: 11/03/2024 PT Individual Time: 9096-9053 PT Individual Time Calculation (min): 43 min   Short Term Goals: Week 1:  PT Short Term Goal 1 (Week 1): Pt will perform bed mobility with overall SBA and improved initiation with no more than 2 vc. PT Short Term Goal 2 (Week 1): Pt will perform sit<>stand with overall SBA and improved initiation with no more than 2 vc. PT Short Term Goal 3 (Week 1): Pt will performstand pivot transfers with consistent CGA  and using RW with reduced vc for positioning. PT Short Term Goal 4 (Week 1): Pt will ambulate at least 75 ft using RW with consistent CGA. PT Short Term Goal 5 (Week 1): Pt will initiate step/ stair training.  Skilled Therapeutic Interventions/Progress Updates:  Patient supine in bed on entrance to room. Patient alert and agreeable to PT session. IV abx running.   Patient with no pain complaint at start of session. Is quicker to respond and with complete sentences today. Voice is louder than yesterday's session. Noted to have very wet brief from UI. Reminded pt that if he is aware of need to urinate to use call button to alert nurses that he needs help to get to toilet.   Continued to demo very slow processing and requiring significant multimodal cues to initiate movement despite quick verbal response to participate.   Therapeutic Activity: Bed Mobility: Pt performed supine > sit with light CGA. VC/ tc required for initiation - none required for technique. Transfers: Pt performed sit<>stand and stand pivot transfers throughout session with CGA/ intermittent MinA for balance with rise to stand. Provided vc/ tc for sequencing steps and maintain focus on task to increase speed of movement.   Gait Training:  Pt ambulated 12' x2 using RW with CGA. Demonstrated slow pace with need for continuous cues for sequencing each step  in order to increase pace.  While seated on toilet, assisted pt with dressing tasks, requiring MinA if given time but significant cues to initiate and continue task. Brief donned with MaxA for time. Pericare provided with MaxA for time.   Patient seated upright in recliner at end of session with brakes locked, BLE elevated, seat pad alarm set, and all needs within reach.   Therapy Documentation Precautions:  Precautions Precautions: Fall Recall of Precautions/Restrictions: Impaired Precaution/Restrictions Comments: R weakness, IV abx through 12/3, urinary incontinence Restrictions Weight Bearing Restrictions Per Provider Order: No  Pain:  No pain related this session.   Therapy/Group: Individual Therapy  [name] DELENA Milliner PT, DPT, CSRS 11/03/2024, 1:51 PM

## 2024-11-03 NOTE — Progress Notes (Signed)
 PROGRESS NOTE   Subjective/Complaints:  Pt rested well. Denied pain. Appears comfortable  ROS: Limited due to cognitive/behavioral   Objective:   No results found. Recent Labs    11/01/24 0405  WBC 9.9  HGB 13.4  HCT 39.1  PLT 165   Recent Labs    11/01/24 0405 11/03/24 0226  NA 134* 136  K 3.9 3.6  CL 102 103  CO2 25 24  GLUCOSE 93 95  BUN 19 14  CREATININE 0.94 0.85  CALCIUM  8.3* 8.4*    Intake/Output Summary (Last 24 hours) at 11/03/2024 0949 Last data filed at 11/03/2024 9161 Gross per 24 hour  Intake 1978.1 ml  Output --  Net 1978.1 ml        Physical Exam: Vital Signs Blood pressure 111/79, pulse 60, temperature 98.9 F (37.2 C), temperature source Oral, resp. rate 17, height 5' 9 (1.753 m), weight 77.3 kg, SpO2 100%.     Constitutional: No distress . Vital signs reviewed. HEENT: NCAT, EOMI, oral membranes moist Neck: supple Cardiovascular: RRR without murmur. No JVD    Respiratory/Chest: CTA Bilaterally without wheezes or rales. Normal effort    GI/Abdomen: BS +, non-tender, non-distended Ext: no clubbing, cyanosis, or edema Psych: flat  Neurologic: Alert and awake, very quiet voice hypophonia , limited insight and awareness.  cranial nerves II through XII intact, motor moving all 4 extremities to gravity and resistance Sensory exam normal sensation to light touch  in bilateral upper and lower extremities  Musculoskeletal: No joint swelling noted   Assessment/Plan: 1. Functional deficits which require 3+ hours per day of interdisciplinary therapy in a comprehensive inpatient rehab setting. Physiatrist is providing close team supervision and 24 hour management of active medical problems listed below. Physiatrist and rehab team continue to assess barriers to discharge/monitor patient progress toward functional and medical goals  Care Tool:  Bathing    Body parts bathed by patient:  Right arm, Left arm, Chest, Abdomen, Face   Body parts bathed by helper: Front perineal area, Buttocks, Right upper leg, Left upper leg, Right lower leg, Left lower leg     Bathing assist Assist Level: Moderate Assistance - Patient 50 - 74%     Upper Body Dressing/Undressing Upper body dressing   What is the patient wearing?: Hospital gown only    Upper body assist Assist Level: Moderate Assistance - Patient 50 - 74%    Lower Body Dressing/Undressing Lower body dressing      What is the patient wearing?: Pants, Incontinence brief     Lower body assist Assist for lower body dressing: Dependent - Patient 0%     Toileting Toileting    Toileting assist Assist for toileting: Dependent - Patient 0%     Transfers Chair/bed transfer  Transfers assist     Chair/bed transfer assist level: Minimal Assistance - Patient > 75%     Locomotion Ambulation   Ambulation assist      Assist level: Moderate Assistance - Patient 50 - 74% Assistive device: Hand held assist Max distance: 10 ft   Walk 10 feet activity   Assist     Assist level: Moderate Assistance - Patient - 50 - 74%  Assistive device: Hand held assist, No Device   Walk 50 feet activity   Assist Walk 50 feet with 2 turns activity did not occur: Safety/medical concerns         Walk 150 feet activity   Assist Walk 150 feet activity did not occur: Safety/medical concerns         Walk 10 feet on uneven surface  activity   Assist Walk 10 feet on uneven surfaces activity did not occur: Safety/medical concerns         Wheelchair     Assist Is the patient using a wheelchair?: Yes Type of Wheelchair: Manual    Wheelchair assist level: Maximal Assistance - Patient 25 - 49% Max wheelchair distance: 250 ft    Wheelchair 50 feet with 2 turns activity    Assist        Assist Level: Maximal Assistance - Patient 25 - 49%   Wheelchair 150 feet activity     Assist       Assist Level: Maximal Assistance - Patient 25 - 49%   Blood pressure 111/79, pulse 60, temperature 98.9 F (37.2 C), temperature source Oral, resp. rate 17, height 5' 9 (1.753 m), weight 77.3 kg, SpO2 100%.  Medical Problem List and Plan: 1. 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  Antithrombotics: -DVT/anticoagulation:  Pharmaceutical: Lovenox              -antiplatelet therapy:  3. Pain Management: tylenol  prn 4. Mood/Behavior/Sleep: LCSW to follow for evaluation and support.             --Melatonin prn for insomnia             -antipsychotic agents: N/A 5. Neuropsych/cognition: This patient is not fully capable of making decisions on his own behalf. 6. Skin/Wound Care: Routine pressure relief  measures.  7. Fluids/Electrolytes/Nutrition: Monitor I/O. Check CMET in am. Monitor renal status closely  -11/29. ?intake doesn't appear to be recorded consistently   -will ask RD to consult   -check prealbumin on monday 8.  Herpes encephalitis: Continue IV acyclovir  thru 11/07/24             --IVF for hydration while on ayclovir to prevent renal injury.             --was on decadron  -tapered off  9. Seizures: Continue Keppra  1000 mg bid 10. Pre-renal azotemia: Encourage fluid intake--may need to increase IVF- mild cont current IVF   11/29 renal function/labs stable to improved.     Latest Ref Rng & Units 11/03/2024    2:26 AM 11/01/2024    4:05 AM 10/31/2024    5:14 AM  BMP  Glucose 70 - 99 mg/dL 95  93  888   BUN 8 - 23 mg/dL 14  19  24    Creatinine 0.61 - 1.24 mg/dL 9.14  9.05  8.83   Sodium 135 - 145  mmol/L 136  134  135   Potassium 3.5 - 5.1 mmol/L  3.6  3.9  4.1   Chloride 98 - 111 mmol/L 103  102  103   CO2 22 - 32 mmol/L 24  25  23    Calcium  8.9 - 10.3 mg/dL 8.4  8.3  8.4     11. H/o Depression: was on Zoloft  50 mg w/Remeron  7.5 mg daily  12.  Hypocalcemia: Boderline low with ionized Calcium  1.14. Caltrate added.  13.  Steroid-induced hyperglycemia:     Serum glucose ok at 111  14. GERD: continue PPI  15. Hypophonia  11/27- Hopefully SLP can help with this 16. Constipation  11/26- LBM    11/29 scheduled MiraLAX --increase to BID   -?po intake  4 days A FACE TO FACE EVALUATION WAS PERFORMED  [name] 11/03/2024, 9:49 AM

## 2024-11-03 NOTE — Progress Notes (Signed)
 Physical Therapy Session Note  Patient Details  Name: [name] MRN: [id number] Date of Birth: [birth year]  Today's Date: 11/03/2024 PT Individual Time: 8552-8471 PT Individual Time Calculation (min): 41 min   Short Term Goals: Week 1:  PT Short Term Goal 1 (Week 1): Pt will perform bed mobility with overall SBA and improved initiation with no more than 2 vc. PT Short Term Goal 2 (Week 1): Pt will perform sit<>stand with overall SBA and improved initiation with no more than 2 vc. PT Short Term Goal 3 (Week 1): Pt will performstand pivot transfers with consistent CGA  and using RW with reduced vc for positioning. PT Short Term Goal 4 (Week 1): Pt will ambulate at least 75 ft using RW with consistent CGA. PT Short Term Goal 5 (Week 1): Pt will initiate step/ stair training.  Skilled Therapeutic Interventions/Progress Updates: Patient sleeping in recliner on entrance to room. Patient alert by verbal name call and agreeable to PT session.   Patient reported no pain.  Therapeutic Activity: Transfers: Pt performed sit<>stand transfers throughout session with minA (modA at end of session from WC<recliner as pt also required maxA to place hands on WC to push vs on side of RW) and cue to count to 10, and by the time pt reaches 10, pt is to be sitting upright in recliner (pt performed by reaching 10) and required increased time to initiate count to stand (PTA started at 1 and pt followed. Pt with decreased step length and clearance to pivot to sit in WC in RW. Pt fluctuated during session by needing assistance to initiate count, would count past 10, or would start singing vs counting and not initiate movement.   Therapeutic Exercise: Pt performed the following exercises with therapist providing the described cuing and facilitation for improvement. - Step to 6 step alternating B LE's with B UE support on railing. Pt required CGA and VC for weight shift and to increase hip flexion to avoid sliding  foot on step when retro-stepping.   Patient sitting in recliner at end of session with brakes locked, belt alarm set, and all needs within reach.      Therapy Documentation Precautions:  Precautions Precautions: Fall Recall of Precautions/Restrictions: Impaired Precaution/Restrictions Comments: R weakness, IV abx through 12/3, urinary incontinence Restrictions Weight Bearing Restrictions Per Provider Order: No  Therapy/Group: Individual Therapy  [name] PTA 11/03/2024, 3:32 PM

## 2024-11-04 DIAGNOSIS — R630 Anorexia: Secondary | ICD-10-CM | POA: Diagnosis not present

## 2024-11-04 DIAGNOSIS — R7989 Other specified abnormal findings of blood chemistry: Secondary | ICD-10-CM | POA: Diagnosis not present

## 2024-11-04 DIAGNOSIS — B004 Herpesviral encephalitis: Secondary | ICD-10-CM | POA: Diagnosis not present

## 2024-11-04 DIAGNOSIS — K5901 Slow transit constipation: Secondary | ICD-10-CM | POA: Diagnosis not present

## 2024-11-04 NOTE — Progress Notes (Signed)
 Physical Therapy Session Note  Patient Details  Name: [name] MRN: [id number] Date of Birth: [birth year]  Today's Date: 11/04/2024 PT Individual Time: 8384-8355 PT Individual Time Calculation (min): 29 min   Short Term Goals: Week 1:  PT Short Term Goal 1 (Week 1): Pt will perform bed mobility with overall SBA and improved initiation with no more than 2 vc. PT Short Term Goal 2 (Week 1): Pt will perform sit<>stand with overall SBA and improved initiation with no more than 2 vc. PT Short Term Goal 3 (Week 1): Pt will performstand pivot transfers with consistent CGA  and using RW with reduced vc for positioning. PT Short Term Goal 4 (Week 1): Pt will ambulate at least 75 ft using RW with consistent CGA. PT Short Term Goal 5 (Week 1): Pt will initiate step/ stair training.  Skilled Therapeutic Interventions/Progress Updates:      Pt sleeping in bed, needs tactile touch to waken. Pt agreeable to therapy treatment. He has no reports of pain. More than reasonable time given to initiate supine to sitting [name], requiring minA due to delayed processing and slow initiation.   Similarly, needing minA for initiating sit<>stand from bed to RW, posterior bias noted in standing but had no overt LOB. Ambulated from his room to day room gym, ~125', with CGA and RW. Cues for increasing his gait speed and keeping his body within walker frame.   Assisted patient onto the N[employer] and setup with [employer] program to provide him a visual cue for keeping set target cadence of 55spm. Pt able to keep around ~40spm without cues, but able to achieve 55spm when prompted. Pt completed x12 minutes total and 395 steps total.   Pt instructed in bean bag toss to board while standing with RW support - supervision for standing balance. Pt unable to throw bag > 10' away so board moved closer to improve success. Pt tolerating standing while tossing > 20 bags.   Pt then instructed in collected the bags he threw  from the ground, by using a reacher and his RW. Pt needing CGA for safety while using the reacher to pick up bags and for navigating around the board to avoid tripping.   Pt ambulated back to his room at CGA level using the RW - had x2 minor LOB posteriorly that were self corrected that occurred with distractions as he was attempting to read signs on doorways while ambulating.   Concluded session in bed with needs met.     Therapy Documentation Precautions:  Precautions Precautions: Fall Recall of Precautions/Restrictions: Impaired Precaution/Restrictions Comments: R weakness, IV abx through 12/3, urinary incontinence Restrictions Weight Bearing Restrictions Per Provider Order: No General:       Therapy/Group: Individual Therapy  [name] P Raini Tiley 11/04/2024, 7:45 AM

## 2024-11-04 NOTE — Progress Notes (Signed)
 PROGRESS NOTE   Subjective/Complaints:  Pt without issues overnight. Feels rested. Pain seems controlled.    ROS: Limited due to cognitive/behavioral    Objective:   No results found. No results for input(s): WBC, HGB, HCT, PLT in the last 72 hours.  Recent Labs    11/03/24 0226  NA 136  K 3.6  CL 103  CO2 24  GLUCOSE 95  BUN 14  CREATININE 0.85  CALCIUM  8.4*    Intake/Output Summary (Last 24 hours) at 11/04/2024 0847 Last data filed at 11/04/2024 0023 Gross per 24 hour  Intake 480 ml  Output 300 ml  Net 180 ml        Physical Exam: Vital Signs Blood pressure 100/66, pulse 71, temperature 97.6 F (36.4 C), temperature source Axillary, resp. rate 19, height 5' 9 (1.753 m), weight 77.3 kg, SpO2 97%.     Constitutional: No distress . Vital signs reviewed. HEENT: NCAT, EOMI, oral membranes moist Neck: supple Cardiovascular: RRR without murmur. No JVD    Respiratory/Chest: CTA Bilaterally without wheezes or rales. Normal effort    GI/Abdomen: BS +, non-tender, non-distended Ext: no clubbing, cyanosis, or edema Psych: flat but cooperative  Neurologic: Alert and awake, very quiet voice hypophonia , limited insight and awareness.  cranial nerves II through XII intact, motor moving all 4 extremities to gravity and resistance--no changes 11/30 Sensory exam normal sensation to light touch  in bilateral upper and lower extremities  Musculoskeletal: No joint swelling noted   Assessment/Plan: 1. Functional deficits which require 3+ hours per day of interdisciplinary therapy in a comprehensive inpatient rehab setting. Physiatrist is providing close team supervision and 24 hour management of active medical problems listed below. Physiatrist and rehab team continue to assess barriers to discharge/monitor patient progress toward functional and medical goals  Care Tool:  Bathing    Body parts bathed  by patient: Right arm, Left arm, Chest, Abdomen, Face   Body parts bathed by helper: Front perineal area, Buttocks, Right upper leg, Left upper leg, Right lower leg, Left lower leg     Bathing assist Assist Level: Moderate Assistance - Patient 50 - 74%     Upper Body Dressing/Undressing Upper body dressing   What is the patient wearing?: Hospital gown only    Upper body assist Assist Level: Moderate Assistance - Patient 50 - 74%    Lower Body Dressing/Undressing Lower body dressing      What is the patient wearing?: Pants, Incontinence brief     Lower body assist Assist for lower body dressing: Dependent - Patient 0%     Toileting Toileting    Toileting assist Assist for toileting: Dependent - Patient 0%     Transfers Chair/bed transfer  Transfers assist     Chair/bed transfer assist level: Minimal Assistance - Patient > 75%     Locomotion Ambulation   Ambulation assist      Assist level: Moderate Assistance - Patient 50 - 74% Assistive device: Hand held assist Max distance: 10 ft   Walk 10 feet activity   Assist     Assist level: Moderate Assistance - Patient - 50 - 74% Assistive device: Hand held assist, No Device  Walk 50 feet activity   Assist Walk 50 feet with 2 turns activity did not occur: Safety/medical concerns         Walk 150 feet activity   Assist Walk 150 feet activity did not occur: Safety/medical concerns         Walk 10 feet on uneven surface  activity   Assist Walk 10 feet on uneven surfaces activity did not occur: Safety/medical concerns         Wheelchair     Assist Is the patient using a wheelchair?: Yes Type of Wheelchair: Manual    Wheelchair assist level: Maximal Assistance - Patient 25 - 49% Max wheelchair distance: 250 ft    Wheelchair 50 feet with 2 turns activity    Assist        Assist Level: Maximal Assistance - Patient 25 - 49%   Wheelchair 150 feet activity     Assist       Assist Level: Maximal Assistance - Patient 25 - 49%   Blood pressure 100/66, pulse 71, temperature 97.6 F (36.4 C), temperature source Axillary, resp. rate 19, height 5' 9 (1.753 m), weight 77.3 kg, SpO2 97%.  Medical Problem List and Plan: 1. 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  Antithrombotics: -DVT/anticoagulation:  Pharmaceutical: Lovenox              -antiplatelet therapy:  3. Pain Management: tylenol  prn 4. Mood/Behavior/Sleep: LCSW to follow for evaluation and support.             --Melatonin prn for insomnia             -antipsychotic agents: N/A 5. Neuropsych/cognition: This patient is not fully capable of making decisions on his own behalf. 6. Skin/Wound Care: Routine pressure relief  measures.  7. Fluids/Electrolytes/Nutrition: Monitor I/O. Check CMET in am. Monitor renal status closely  -11/30 intake was recorded yesterday and was 75-100% for all meals   -will hold off on RD consult   -still can check prealbumin on monday 8.  Herpes encephalitis: Continue IV acyclovir  thru 11/07/24             --IVF for hydration while on ayclovir to prevent renal injury.             --was on decadron  -tapered off  9. Seizures: Continue Keppra  1000 mg bid 10. Pre-renal azotemia: Encourage fluid intake--may need to increase IVF- mild cont current IVF   11/29 renal function/labs stable to improved.     Latest Ref Rng & Units 11/03/2024    2:26 AM 11/01/2024    4:05 AM 10/31/2024    5:14 AM  BMP  Glucose 70 - 99 mg/dL 95  93  888   BUN 8 - 23 mg/dL 14  19  24    Creatinine 0.61 - 1.24 mg/dL 9.14  9.05   8.83   Sodium 135 - 145 mmol/L 136  134  135   Potassium 3.5 - 5.1 mmol/L 3.6  3.9  4.1   Chloride 98 - 111 mmol/L 103  102  103   CO2 22 - 32 mmol/L 24  25  23    Calcium  8.9 - 10.3 mg/dL 8.4  8.3  8.4     11. H/o Depression: was on Zoloft  50 mg w/Remeron  7.5 mg daily  12.  Hypocalcemia: Boderline low with ionized Calcium  1.14. Caltrate added.  13.  Steroid-induced hyperglycemia:     Serum glucose ok at 111  14. GERD: continue PPI  15. Hypophonia  11/27- Hopefully SLP can help with this 16. Constipation  11/28- LBM    11/29 scheduled MiraLAX --increased to BID   -push po intake  5 days A FACE TO FACE EVALUATION WAS PERFORMED  Arthea ONEIDA Gunther 11/04/2024, 8:47 AM

## 2024-11-04 NOTE — Progress Notes (Signed)
 Speech Language Pathology Daily Session Note  Patient Details  Name: [name] MRN: [id number] Date of Birth: Aug 29, 1961  Today's Date: 11/04/2024 SLP Individual Time: 0900-1000 SLP Individual Time Calculation (min): 60 min  Short Term Goals: Week 1: SLP Short Term Goal 1 (Week 1): Pt will demonstrated improved oral containment with cup sups of thin liquids and reduce or eliminate anterior loss in 80% of opportunities. SLP Short Term Goal 2 (Week 1): Pt will complete simple problem solving tasks with min verbal cues with >50% accuracy. SLP Short Term Goal 3 (Week 1): Pt will follow multi-step direction with min verbal cues with >50% accuracy. SLP Short Term Goal 4 (Week 1): Pt will attend to structured tasks for >5 minutes with min verbal cues. SLP Short Term Goal 5 (Week 1): Pt will participate in education regarding voice and vocal hygiene with min cues.  Skilled Therapeutic Interventions:   Pt greeted at bedside for tx targeting cognition. He was asleep with his fork in his hand upon SLP arrival. Anticipate he was attempting to eat breakfast, however, pt was reclined significantly. Pt demonstrated no awareness re poor positioning and reduced safety. SLP provided education, however, anticipate that pt would benefit from set up and intermittent supervision during meals given cognitive deficits. SLP then facilitated orientation review via calendar. He benefited from modA to ID date. Reason for stay and location provided via errorless learning. He was able to recall that he was in a hospital, but incorrect hospital identified. After 2 mins, he was able to recall the date independently. However, after an additional 5 min delay, he required modA cues to recall date. He was able to recall biographical info w/ supervisionA. Attempted to complete written task targeting visual organization, initiation, and sustained attention; however, pt required max-totalA cues to remain awake. 3 trials completed  before task was discontinued. At the end of tx tasks, he was left in bed w/ the alarm set and call light within reach.    Pain Pain Assessment Pain Scale: 0-10 Pain Score: 0-No pain  Therapy/Group: Individual Therapy  Recardo DELENA Mole 11/04/2024, 9:11 AM

## 2024-11-04 NOTE — Plan of Care (Signed)
  Problem: Consults Goal: RH BRAIN INJURY PATIENT EDUCATION Description: Description: See Patient Education module for eduction specifics Outcome: Progressing Goal: Nutrition Consult-if indicated Outcome: Progressing   Problem: RH BOWEL ELIMINATION Goal: RH STG MANAGE BOWEL WITH ASSISTANCE Description: STG Manage Bowel with mod I Assistance. Outcome: Progressing Goal: RH STG MANAGE BOWEL W/MEDICATION W/ASSISTANCE Description: STG Manage Bowel with Medication with mod I Assistance. Outcome: Progressing   Problem: RH BLADDER ELIMINATION Goal: RH STG MANAGE BLADDER WITH ASSISTANCE Description: STG Manage Bladder With toileting Assistance Outcome: Progressing   Problem: RH SKIN INTEGRITY Goal: RH STG SKIN FREE OF INFECTION/BREAKDOWN Description: Manage w min assist Outcome: Progressing Goal: RH STG MAINTAIN SKIN INTEGRITY WITH ASSISTANCE Description: STG Maintain Skin Integrity With min Assistance. Outcome: Progressing Goal: RH STG ABLE TO PERFORM INCISION/WOUND CARE W/ASSISTANCE Description: STG Able To Perform Incision/Wound Care With min Assistance. Outcome: Progressing   Problem: RH SAFETY Goal: RH STG ADHERE TO SAFETY PRECAUTIONS W/ASSISTANCE/DEVICE Description: STG Adhere to Safety Precautions With  cues Assistance/Device. Outcome: Progressing   Problem: RH COGNITION-NURSING Goal: RH STG USES MEMORY AIDS/STRATEGIES W/ASSIST TO PROBLEM SOLVE Description: STG Uses Memory Aids/Strategies With cues Assistance to Problem Solve. Outcome: Progressing   Problem: RH KNOWLEDGE DEFICIT BRAIN INJURY Goal: RH STG INCREASE KNOWLEDGE OF SELF CARE AFTER BRAIN INJURY Description: Patient and spouse will be able to manage care at discharge using educational resources independently Outcome: Progressing

## 2024-11-04 NOTE — Progress Notes (Signed)
 Occupational Therapy Session Note  Patient Details  Name: [name] MRN: [id number] Date of Birth: 1961-10-09  Today's Date: 11/04/2024 OT Individual Time: 1000-1100 OT Individual Time Calculation (min): 60 min    Short Term Goals: Week 1:  OT Short Term Goal 1 (Week 1): Pt will perform LB dressing and bathing with Mod A OT Short Term Goal 2 (Week 1): Pt will complete toileting with Mod A.  Skilled Therapeutic Interventions/Progress Updates:   Initial Encounter:   Patient resting in bed at the time of arrival watching TV. The pt communicated appropriately in relation to the context of the conversation. The pt was in agreement with working on cognition, sit to stand , and UB strengthening.   Transfers: The pt was able to come from supine in bed to [name] with Mod to MinA . The pt was able to come from sit to stand incorporating the bed and the RW for transferring to the w//c with MinA using the RW.    UB Exercise: The pt was able to complete UB exercises using a 1lb dowel 1 set of 15 for shld flexion, horizontal abduction, shld rotation, and lg circles with rest breaks as needed. The pt required  2 rest breaks and was encouraged to incorporate relaxation breathing.  The pt went on to complete the UB cycle in standing   BADL: The pt transported to the bathroom and he had soiled his brief The pt  was able to come into stand for doffing the brief at MaxA.  The pt was able to complete peri care for front with initial cues, he was MaxA for cleaning his bottom. The pt was MaxA for donning his brief and MinA for donning his pants. At the end of the session, the pt remained at w/c LOF with his footrest in place, he wash  was able to his hand with s/u. Prior to me exiting the room, the call light and bedside table were  placed out front with his safety alarm activated. Therapy Documentation Precautions:  Precautions Precautions: Fall Recall of Precautions/Restrictions:  Impaired Precaution/Restrictions Comments: R weakness, IV abx through 12/3, urinary incontinence Restrictions Weight Bearing Restrictions Per Provider Order: No  Therapy/Group: Individual Therapy  Elvera JONETTA Mace 11/04/2024, 12:33 PM

## 2024-11-05 DIAGNOSIS — R498 Other voice and resonance disorders: Secondary | ICD-10-CM | POA: Diagnosis not present

## 2024-11-05 DIAGNOSIS — B004 Herpesviral encephalitis: Secondary | ICD-10-CM | POA: Diagnosis not present

## 2024-11-05 DIAGNOSIS — R269 Unspecified abnormalities of gait and mobility: Secondary | ICD-10-CM | POA: Diagnosis not present

## 2024-11-05 LAB — BASIC METABOLIC PANEL WITH GFR
Anion gap: 8 (ref 5–15)
BUN: 12 mg/dL (ref 8–23)
CO2: 25 mmol/L (ref 22–32)
Calcium: 8.4 mg/dL — ABNORMAL LOW (ref 8.9–10.3)
Chloride: 102 mmol/L (ref 98–111)
Creatinine, Ser: 0.86 mg/dL (ref 0.61–1.24)
GFR, Estimated: >60 mL/min (ref >60)
Glucose, Bld: 95 mg/dL (ref 70–99)
Potassium: 3.7 mmol/L (ref 3.5–5.1)
Sodium: 135 mmol/L (ref 135–145)

## 2024-11-05 LAB — CBC
HCT: 36 % — ABNORMAL LOW (ref 39.0–52.0)
Hemoglobin: 12.6 g/dL — ABNORMAL LOW (ref 13.0–17.0)
MCH: 34.3 pg — ABNORMAL HIGH (ref 26.0–34.0)
MCHC: 35 g/dL (ref 30.0–36.0)
MCV: 98.1 fL (ref 80.0–100.0)
Platelets: 155 K/uL (ref 150–400)
RBC: 3.67 MIL/uL — ABNORMAL LOW (ref 4.22–5.81)
RDW: 15.5 % (ref 11.5–15.5)
WBC: 8.6 K/uL (ref 4.0–10.5)
nRBC: 0 % (ref 0.0–0.2)

## 2024-11-05 LAB — PREALBUMIN: Prealbumin: 24 mg/dL (ref 18–38)

## 2024-11-05 MED ORDER — PNEUMOCOCCAL 20-VAL CONJ VACC 0.5 ML IM SUSY
0.5000 mL | PREFILLED_SYRINGE | INTRAMUSCULAR | Status: AC
  Administered 2024-11-09: 0.5 mL via INTRAMUSCULAR
  Filled 2024-11-05: qty 0.5

## 2024-11-05 MED ORDER — INFLUENZA VIRUS VACC SPLIT PF (FLUZONE) 0.5 ML IM SUSY
0.5000 mL | PREFILLED_SYRINGE | INTRAMUSCULAR | Status: AC
  Administered 2024-11-09: 0.5 mL via INTRAMUSCULAR

## 2024-11-05 NOTE — Progress Notes (Signed)
 PROGRESS NOTE   Subjective/Complaints:  Labs reviewed essentially normal  Pt oriented to hospital in [place], month November  was not oriented to day , or date Discussed normal labs   ROS: Limited due to cognitive/behavioral    Objective:   No results found. Recent Labs    11/05/24 0432  WBC 8.6  HGB 12.6*  HCT 36.0*  PLT 155    Recent Labs    11/03/24 0226 11/05/24 0432  NA 136 135  K 3.6 3.7  CL 103 102  CO2 24 25  GLUCOSE 95 95  BUN 14 12  CREATININE 0.85 0.86  CALCIUM  8.4* 8.4*    Intake/Output Summary (Last 24 hours) at 11/05/2024 1041 Last data filed at 11/05/2024 0932 Gross per 24 hour  Intake 2435.68 ml  Output --  Net 2435.68 ml        Physical Exam: Vital Signs Blood pressure 103/75, pulse 67, temperature 98.7 F (37.1 C), temperature source Oral, resp. rate 17, height 5' 9 (1.753 m), weight 77.3 kg, SpO2 96%.  General: No acute distress Mood and affect are appropriate Heart: Regular rate and rhythm no rubs murmurs or extra sounds Lungs: Clear to auscultation, breathing unlabored, no rales or wheezes Abdomen: Positive bowel sounds, soft nontender to palpation, nondistended Extremities: No clubbing, cyanosis, or edema Skin: No evidence of breakdown, no evidence of rash   Neurologic: Alert and awake, very quiet voice hypophonia , limited insight and awareness.  cranial nerves II through XII intact, motor moving all 4 extremities to gravity and resistance--no changes 11/30 Sensory exam normal sensation to light touch  in bilateral upper and lower extremities  Musculoskeletal: No joint swelling noted   Assessment/Plan: 1. Functional deficits which require 3+ hours per day of interdisciplinary therapy in a comprehensive inpatient rehab setting. Physiatrist is providing close team supervision and 24 hour management of active medical problems listed below. Physiatrist and rehab team  continue to assess barriers to discharge/monitor patient progress toward functional and medical goals  Care Tool:  Bathing    Body parts bathed by patient: Right arm, Left arm, Chest, Abdomen, Face   Body parts bathed by helper: Front perineal area, Buttocks, Right upper leg, Left upper leg, Right lower leg, Left lower leg     Bathing assist Assist Level: Moderate Assistance - Patient 50 - 74%     Upper Body Dressing/Undressing Upper body dressing   What is the patient wearing?: Hospital gown only    Upper body assist Assist Level: Moderate Assistance - Patient 50 - 74%    Lower Body Dressing/Undressing Lower body dressing      What is the patient wearing?: Pants, Incontinence brief     Lower body assist Assist for lower body dressing: Dependent - Patient 0%     Toileting Toileting    Toileting assist Assist for toileting: Dependent - Patient 0%     Transfers Chair/bed transfer  Transfers assist     Chair/bed transfer assist level: Minimal Assistance - Patient > 75%     Locomotion Ambulation   Ambulation assist      Assist level: Contact Guard/Touching assist Assistive device: Walker-rolling Max distance: 125'   Walk 10  feet activity   Assist     Assist level: Contact Guard/Touching assist Assistive device: Walker-rolling   Walk 50 feet activity   Assist Walk 50 feet with 2 turns activity did not occur: Safety/medical concerns  Assist level: Contact Guard/Touching assist Assistive device: Walker-rolling    Walk 150 feet activity   Assist Walk 150 feet activity did not occur: Safety/medical concerns         Walk 10 feet on uneven surface  activity   Assist Walk 10 feet on uneven surfaces activity did not occur: Safety/medical concerns         Wheelchair     Assist Is the patient using a wheelchair?: Yes Type of Wheelchair: Manual    Wheelchair assist level: Maximal Assistance - Patient 25 - 49% Max wheelchair  distance: 250 ft    Wheelchair 50 feet with 2 turns activity    Assist        Assist Level: Maximal Assistance - Patient 25 - 49%   Wheelchair 150 feet activity     Assist      Assist Level: Maximal Assistance - Patient 25 - 49%   Blood pressure 103/75, pulse 67, temperature 98.7 F (37.1 C), temperature source Oral, resp. rate 17, height 5' 9 (1.753 m), weight 77.3 kg, SpO2 96%.  Medical Problem List and Plan: 1. 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  Antithrombotics: -DVT/anticoagulation:  Pharmaceutical: Lovenox              -antiplatelet therapy:  3. Pain Management: tylenol  prn 4. Mood/Behavior/Sleep: LCSW to follow for evaluation and support.             --Melatonin prn for insomnia             -antipsychotic agents: N/A 5. Neuropsych/cognition: This patient is not fully capable of making decisions on his own behalf. 6. Skin/Wound Care: Routine pressure relief  measures.  7. Fluids/Electrolytes/Nutrition: Monitor I/O. Check CMET in am. Monitor renal status closely  -11/30 intake was recorded yesterday and was 75-100% for all meals   Normal prealbumin on 12/1 8.  Herpes encephalitis: Continue IV acyclovir  thru 11/07/24             --IVF for hydration while on ayclovir to prevent renal injury.             --was on decadron  -tapered off  9. Seizures: Continue Keppra  1000 mg bid 10. Pre-renal azotemia: Encourage fluid intake--may need to increase IVF- mild cont current IVF   11/29 renal function/labs stable to improved.     Latest Ref Rng & Units 11/05/2024    4:32 AM 11/03/2024     2:26 AM 11/01/2024    4:05 AM  BMP  Glucose 70 - 99 mg/dL 95  95  93   BUN 8 - 23 mg/dL 12  14  19    Creatinine 0.61 - 1.24 mg/dL 9.13  9.14  9.05   Sodium 135 - 145 mmol/L 135  136  134   Potassium 3.5 - 5.1 mmol/L 3.7  3.6  3.9   Chloride 98 - 111 mmol/L 102  103  102   CO2 22 - 32 mmol/L 25  24  25    Calcium  8.9 - 10.3 mg/dL 8.4  8.4  8.3     11. H/o Depression: was on Zoloft  50 mg w/Remeron  7.5 mg daily  12.  Hypocalcemia: Boderline low with ionized Calcium  1.14. Caltrate added.  13.  Steroid-induced hyperglycemia:     Serum glucose ok at 111  14. GERD: continue PPI  15. Hypophonia  11/27- Hopefully SLP can help with this 16. Constipation  11/28- LBM    11/29 scheduled MiraLAX --increased to BID   -push po intake  6 days A FACE TO FACE EVALUATION WAS PERFORMED  Prentice FORBES Compton 11/05/2024, 10:41 AM

## 2024-11-05 NOTE — Progress Notes (Signed)
 Occupational Therapy Session Note  Patient Details  Name: [name] MRN: [id number] Date of Birth: 1961/05/25  Today's Date: 11/05/2024 OT Individual Time: 1102-       Short Term Goals: Week 1:  OT Short Term Goal 1 (Week 1): Pt will perform LB dressing and bathing with Mod A OT Short Term Goal 2 (Week 1): Pt will complete toileting with Mod A.  Skilled Therapeutic Interventions/Progress Updates:   Patient received seated in recliner.  Patient disoriented to place and situation.  Provided reorientation.  Patient able to use clock and memory notebook with mod cueing to determine current time/ therapy.   Patient agreeable to brush teeth at sink - and walked to sink with RW and min assist.  Patient's pants fell down without his knowledge while walking.   Patient stood at sink to brush teeth, wash face and comb hair.  Patient needed cueing for initiation and termination for each task.  Patient declined shaving or need for bathroom.  Patient walked back to chair and worked on safe descent stand to sit with controlled.   Patient left up in recliner with safety belt in place and engaged and call bell in reach  Therapy Documentation Precautions:  Precautions Precautions: Fall Recall of Precautions/Restrictions: Impaired Precaution/Restrictions Comments: R weakness, IV abx through 12/3, urinary incontinence Restrictions Weight Bearing Restrictions Per Provider Order: No   Therapy/Group: Individual Therapy  Shlonda Dolloff M 11/05/2024, 11:12 AM

## 2024-11-05 NOTE — Progress Notes (Incomplete)
 Physical Therapy Session Note  Patient Details  Name: [name] MRN: [id number] Date of Birth: 07/13/1961  {CHL IP REHAB PT TIME CALCULATION:304800500}  Short Term Goals: {DUH:6958314}  Skilled Therapeutic Interventions/Progress Updates:      Therapy Documentation Precautions:  Precautions Precautions: Fall Recall of Precautions/Restrictions: Impaired Precaution/Restrictions Comments: R weakness, IV abx through 12/3, urinary incontinence Restrictions Weight Bearing Restrictions Per Provider Order: No General:   Vital Signs: Therapy Vitals Temp: 98.7 F (37.1 C) Temp Source: Oral Pulse Rate: 67 Resp: 17 BP: 103/75 Patient Position (if appropriate): Lying Oxygen  Therapy SpO2: 96 % O2 Device: Room Air Pain:   Mobility:   Locomotion :    Trunk/Postural Assessment :    Balance:   Exercises:   Other Treatments:      Therapy/Group: {Therapy/Group:[id number]}  [name] [name] 11/05/2024, 7:48 AM

## 2024-11-05 NOTE — Progress Notes (Signed)
 Occupational Therapy Session Note  Patient Details  Name: [name] MRN: [id number] Date of Birth: 1961-05-17  Today's Date: 11/05/2024 OT Individual Time: 8653-8560 OT Individual Time Calculation (min): 53 min  and Today's Date: 11/05/2024 OT Missed Time: 22 Minutes Missed Time Reason: Patient fatigue   Short Term Goals: Week 1:  OT Short Term Goal 1 (Week 1): Pt will perform LB dressing and bathing with Mod A OT Short Term Goal 2 (Week 1): Pt will complete toileting with Mod A.  Skilled Therapeutic Interventions/Progress Updates:     Pt received sleeping in recliner, waking upon OT call out his name. Pt presenting to be in good spirits receptive to skilled OT session reporting 0/10 pain- OT offering intermittent rest breaks, repositioning, and therapeutic support to optimize participation in therapy session. Pt with slowed processing and initiation throughout session requiring MAX multimodal cues for initiation +increased time. Pt brief noted to be soiled without his awareness. Offered toileting to adhere to timed toileting schedule and to clean up following incontinent void with Pt receptive. Completed functional mobility to bathroom using RW with CGA for balance and assistance for IV pole management- slowed cadence, posterior bias, and poor safety awareness noted with MAX multimodal cues required to initiate stand. Doffed pants/brief with CGA for balance. Increased time required on toilet d/t need for BM, see flowsheets. Pt completed portion of peri-care while seated, however making a mess so finished posterior peri-care in standing position with MIN A required to ensure cleanliness. Seated rest break provided 2/2 fatigue. LB bathing completed with CGA for dynamic sitting balance +MAX multimodal cues for attention and initiation. Donned clean brief total A and pants with CGA required when weaving feet into pants and when brining pants to waist in standing. Pt completed functional mobility  to sink and washed hands in standing for increased balance and activity tolerance challenge. Pt with increased fatigue following ADLs and Pt stating I am pooped, I need to get in bed. Provided seated rest break and therapeutic support to continue session, however Pt insisted on returning to bed. Functional mobility wc > [name] using RW ~60ft CGA +increased time. [name] > supine CGA. Pt immediately falling asleep once in bed. Pt was left resting in bed with call bell in reach, bed alarm on, and all needs met. Missed 22 min skilled OT treatment 2/2 fatigue. Will attempt to make up time as Pt's status and schedule allow.    Therapy Documentation Precautions:  Precautions Precautions: Fall Recall of Precautions/Restrictions: Impaired Precaution/Restrictions Comments: R weakness, IV abx through 12/3, urinary incontinence Restrictions Weight Bearing Restrictions Per Provider Order: No  Therapy/Group: Individual Therapy  Katheryn SHAUNNA [place]ines 11/05/2024, 2:14 PM

## 2024-11-06 DIAGNOSIS — R498 Other voice and resonance disorders: Secondary | ICD-10-CM | POA: Diagnosis not present

## 2024-11-06 DIAGNOSIS — R269 Unspecified abnormalities of gait and mobility: Secondary | ICD-10-CM | POA: Diagnosis not present

## 2024-11-06 DIAGNOSIS — B004 Herpesviral encephalitis: Secondary | ICD-10-CM | POA: Diagnosis not present

## 2024-11-06 NOTE — Progress Notes (Signed)
 Physical Therapy Session Note  Patient Details  Name: [name] MRN: [id number] Date of Birth: [birth year]  Today's Date: 11/06/2024 PT Individual Time: 1031-1119 PT Individual Time Calculation (min): 48 min   Short Term Goals: Week 1:  PT Short Term Goal 1 (Week 1): Pt will perform bed mobility with overall SBA and improved initiation with no more than 2 vc. PT Short Term Goal 2 (Week 1): Pt will perform sit<>stand with overall SBA and improved initiation with no more than 2 vc. PT Short Term Goal 3 (Week 1): Pt will performstand pivot transfers with consistent CGA  and using RW with reduced vc for positioning. PT Short Term Goal 4 (Week 1): Pt will ambulate at least 75 ft using RW with consistent CGA. PT Short Term Goal 5 (Week 1): Pt will initiate step/ stair training.  Skilled Therapeutic Interventions/Progress Updates:     Pt sleep in recliner upon PT arrival. Alert and agreeable to therapy when PT calls out name. Pt reports no pain.   Performs sit<>stand transfers and ambulates throughout session with RW and CGA, also requiring increased time for initiation.   Pt ambulates from room>main gym, ~145ft. PT sets up activity with 9 horseshoes placed on various surfaces at various heights. Instructed pt to collect horseshoes one by one and hook onto basketball hoop rim set to 41ft. Pt completes exercise in with one LOB when turning to theR too fast. PT provided modA for pt to regain balance, pt was unaware of incident occurring. Pt was also unaware of hospital pants+briefs falling down during activity. Pt did noticed PT change horseshoe positioning while pt was not looking. Seated rest break was provided after completing.  Pt was then instructed to take 6 horseshoes off the rim one by one and place them on the mat ~61ft away. Pt completed activity in , no LOB or missteps noted. D/t time, pt was returned to room via WC.  Pt perform stand>pivot transfer from WC>bed with HHA from  PT. Pt was left semi-reclined in bed as requested, alarm set, and all needs within reach. Nsg was notified about IV pole dysfunction and arrived as PT exited.  Therapy Documentation Precautions:  Precautions Precautions: Fall Recall of Precautions/Restrictions: Impaired Precaution/Restrictions Comments: R weakness, IV abx through 12/3, urinary incontinence Restrictions Weight Bearing Restrictions Per Provider Order: No  Therapy/Group: Individual Therapy  Iran Kievit SPT 11/06/2024, 12:25 PM

## 2024-11-06 NOTE — Progress Notes (Signed)
 PROGRESS NOTE   Subjective/Complaints:  On IVF, sitting in recliner, responds to simple questions but no spontaneous speech , per staff notes had sundowning last noc , no daytime agitation  THerapy notes mainly somnolence   ROS: Limited due to cognitive/behavioral    Objective:   No results found. Recent Labs    11/05/24 0432  WBC 8.6  HGB 12.6*  HCT 36.0*  PLT 155    Recent Labs    11/05/24 0432  NA 135  K 3.7  CL 102  CO2 25  GLUCOSE 95  BUN 12  CREATININE 0.86  CALCIUM  8.4*    Intake/Output Summary (Last 24 hours) at 11/06/2024 0911 Last data filed at 11/05/2024 2010 Gross per 24 hour  Intake 3266.85 ml  Output --  Net 3266.85 ml        Physical Exam: Vital Signs Blood pressure 99/71, pulse 64, temperature 98.2 F (36.8 C), temperature source Oral, resp. rate 16, height 5' 9 (1.753 m), weight 77.3 kg, SpO2 98%.  General: No acute distress Mood and affect are appropriate Heart: Regular rate and rhythm no rubs murmurs or extra sounds Lungs: Clear to auscultation, breathing unlabored, no rales or wheezes Abdomen: Positive bowel sounds, soft nontender to palpation, nondistended Extremities: No clubbing, cyanosis, or edema Skin: No evidence of breakdown, no evidence of rash   Neurologic: mild lethargy , oriented to date but not month , very quiet voice hypophonia , limited insight and awareness.  cranial nerves II through XII intact, motor moving all 4 extremities to gravity and resistance--   Musculoskeletal: No joint swelling noted. No pain with ROM    Assessment/Plan: 1. Functional deficits which require 3+ hours per day of interdisciplinary therapy in a comprehensive inpatient rehab setting. Physiatrist is providing close team supervision and 24 hour management of active medical problems listed below. Physiatrist and rehab team continue to assess barriers to discharge/monitor patient  progress toward functional and medical goals  Care Tool:  Bathing    Body parts bathed by patient: Right arm, Left arm, Chest, Abdomen, Face   Body parts bathed by helper: Front perineal area, Buttocks, Right upper leg, Left upper leg, Right lower leg, Left lower leg     Bathing assist Assist Level: Moderate Assistance - Patient 50 - 74%     Upper Body Dressing/Undressing Upper body dressing   What is the patient wearing?: Hospital gown only    Upper body assist Assist Level: Moderate Assistance - Patient 50 - 74%    Lower Body Dressing/Undressing Lower body dressing      What is the patient wearing?: Pants, Incontinence brief     Lower body assist Assist for lower body dressing: Dependent - Patient 0%     Toileting Toileting    Toileting assist Assist for toileting: Dependent - Patient 0%     Transfers Chair/bed transfer  Transfers assist     Chair/bed transfer assist level: Minimal Assistance - Patient > 75%     Locomotion Ambulation   Ambulation assist      Assist level: Contact Guard/Touching assist Assistive device: Walker-rolling Max distance: 125'   Walk 10 feet activity   Assist  Assist level: Contact Guard/Touching assist Assistive device: Walker-rolling   Walk 50 feet activity   Assist Walk 50 feet with 2 turns activity did not occur: Safety/medical concerns  Assist level: Contact Guard/Touching assist Assistive device: Walker-rolling    Walk 150 feet activity   Assist Walk 150 feet activity did not occur: Safety/medical concerns         Walk 10 feet on uneven surface  activity   Assist Walk 10 feet on uneven surfaces activity did not occur: Safety/medical concerns         Wheelchair     Assist Is the patient using a wheelchair?: Yes Type of Wheelchair: Manual    Wheelchair assist level: Maximal Assistance - Patient 25 - 49% Max wheelchair distance: 250 ft    Wheelchair 50 feet with 2 turns  activity    Assist        Assist Level: Maximal Assistance - Patient 25 - 49%   Wheelchair 150 feet activity     Assist      Assist Level: Maximal Assistance - Patient 25 - 49%   Blood pressure 99/71, pulse 64, temperature 98.2 F (36.8 C), temperature source Oral, resp. rate 16, height 5' 9 (1.753 m), weight 77.3 kg, SpO2 98%.  Medical Problem List and Plan: 1. 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  Antithrombotics: -DVT/anticoagulation:  Pharmaceutical: Lovenox              -antiplatelet therapy:  3. Pain Management: tylenol  prn 4. Mood/Behavior/Sleep: LCSW to follow for evaluation and support.             --Melatonin prn for insomnia             -antipsychotic agents: N/A 5. Neuropsych/cognition: This patient is not fully capable of making decisions on his own behalf. 6. Skin/Wound Care: Routine pressure relief  measures.  7. Fluids/Electrolytes/Nutrition: Monitor I/O. Check CMET in am. Monitor renal status closely  -11/30 intake was recorded yesterday and was 75-100% for all meals   Normal prealbumin on 12/1 8.  Herpes encephalitis: Continue IV acyclovir  thru 11/07/24             --IVF for hydration while on ayclovir to prevent renal injury.             --was on decadron  -tapered off  9. Seizures: Continue Keppra  1000 mg bid 10. Pre-renal azotemia: Encourage fluid intake--may need to increase IVF- mild cont current IVF   11/29 renal function/labs stable to improved.     Latest Ref Rng & Units 11/05/2024    4:32 AM 11/03/2024    2:26 AM 11/01/2024    4:05 AM  BMP  Glucose 70 - 99  mg/dL 95  95  93   BUN 8 - 23 mg/dL 12  14  19    Creatinine 0.61 - 1.24 mg/dL 9.13  9.14  9.05   Sodium 135 - 145 mmol/L 135  136  134   Potassium 3.5 - 5.1  mmol/L 3.7  3.6  3.9   Chloride 98 - 111 mmol/L 102  103  102   CO2 22 - 32 mmol/L 25  24  25    Calcium  8.9 - 10.3 mg/dL 8.4  8.4  8.3     11. H/o Depression: was on Zoloft  50 mg w/Remeron  7.5 mg daily  12.  Hypocalcemia: Boderline low with ionized Calcium  1.14. Caltrate added.  13.  Steroid-induced hyperglycemia:     Serum glucose ok at 111  14. GERD: continue PPI  15. Hypophonia  11/27- Hopefully SLP can help with this 16. Constipation  11/28- LBM    11/29 scheduled MiraLAX --increased to BID   -push po intake  7 days A FACE TO FACE EVALUATION WAS PERFORMED  [name] 11/06/2024, 9:11 AM

## 2024-11-06 NOTE — Progress Notes (Signed)
 Occupational Therapy Session Note  Patient Details  Name: [name] MRN: [id number] Date of Birth: 1961/05/28  Today's Date: 11/06/2024 OT Individual Time: 0804-0900 OT Individual Time Calculation (min): 56 min    Short Term Goals: Week 1:  OT Short Term Goal 1 (Week 1): Pt will perform LB dressing and bathing with Mod A OT Short Term Goal 2 (Week 1): Pt will complete toileting with Mod A.  Skilled Therapeutic Interventions/Progress Updates:     Pt received deeply sleeping in bed, waking upon OT arrival requiring increased time to initiate participation in OT session. Pt reporting 0/10 pain- OT offering intermittent rest breaks, repositioning, and therapeutic support to optimize participation in therapy session. Focused session on ADL retraining with emphasis on functional cognition, functional transfers, and increasing overall independence. Pt with slowed task initiation, delayed processing, and poor attention throughout session requiring significantly increased amount of time to complete mobility and ADLs. He transitioned to [name] with HOB elevated SUP +consistent verbal cues to continue task and increased time. He completed 3 bouts of functional mobility ambulating from [name] > toilet > sink > recliner using RW with SBA-CGA required +increased time and verbal cues required for safety using RW and hand placement. Poor safety awareness and problem solving noted. Pt's brief noted to be wet during toileting- increased time provided on toilet, however no BM or void at this time. Completed sponge bath seated on toilet with SUP +increased time and MAX verbal cues for initiation. Donned gown with MIN A for IV line management and pants with CGA required when weaving feet and bringing pants to waist +increased time. Stood at sink using RW while brushing teeth and grooming hair for increased standing balance and standing tolerance challenge. Able to sequence and attend to grooming/hygiene tasks with MIN  verbal cues. Pt was left resting in recliner with call bell in reach, chair alarm on, and all needs met.    Therapy Documentation Precautions:  Precautions Precautions: Fall Recall of Precautions/Restrictions: Impaired Precaution/Restrictions Comments: R weakness, IV abx through 12/3, urinary incontinence Restrictions Weight Bearing Restrictions Per Provider Order: No   Therapy/Group: Individual Therapy  Katheryn SHAUNNA [place] 11/06/2024, 7:29 AM

## 2024-11-06 NOTE — Progress Notes (Signed)
 Speech Language Pathology Daily Session Note  Patient Details  Name: [name] MRN: [id number] Date of Birth: 09-29-1961  Today's Date: 11/06/2024 SLP Individual Time: 9095-8995 SLP Individual Time Calculation (min): 60 min  Short Term Goals: Week 1: SLP Short Term Goal 1 (Week 1): Pt will demonstrated improved oral containment with cup sups of thin liquids and reduce or eliminate anterior loss in 80% of opportunities. SLP Short Term Goal 2 (Week 1): Pt will complete simple problem solving tasks with min verbal cues with >50% accuracy. SLP Short Term Goal 3 (Week 1): Pt will follow multi-step direction with min verbal cues with >50% accuracy. SLP Short Term Goal 4 (Week 1): Pt will attend to structured tasks for >5 minutes with min verbal cues. SLP Short Term Goal 5 (Week 1): Pt will participate in education regarding voice and vocal hygiene with min cues.  Skilled Therapeutic Interventions:  Patient was seen in am to address cognitive re- training. Pt with fluctuating alertness upon SLP arrival. Pt warranting additional time before achieving full alertness. Initially, pt was oriented to name and DOB. He was not oriented to time, place, and situation. Frequent re-teaching throughout session and provision of updated calendar resulted in min A for orientation time, situation, and place. Pt consumed thin liquids via cup sips without anterior spillage in 4/4 trials. Pt demonstrating some improvement in awareness as he confirmed changes in thinking and memory with max A. In other minutes of session, SLP addressing problem solving through interpretation of therapy schedule. Given min A, pt identified times, disciplines and names of therapist. SLP also addressed re- training of call button. Pt initially warranting total A for identifying call button with cues able to be faded to mod A by conclusion of session. He identified uses of call button with sup A. Pt also challenged in simple math and money  management. Pt identified units of money indep, completed simple addition with max A, and simple money calculations with total A. At conclusion of session, pt was left upright in recliner with call button within reach. SLP to continue POC.   Pain Pain Assessment Pain Scale: 0-10 Pain Score: 0-No pain  Therapy/Group: Individual Therapy  Joane GORMAN Fuss 11/06/2024, 12:15 PM

## 2024-11-07 DIAGNOSIS — B004 Herpesviral encephalitis: Secondary | ICD-10-CM | POA: Diagnosis not present

## 2024-11-07 DIAGNOSIS — R498 Other voice and resonance disorders: Secondary | ICD-10-CM | POA: Diagnosis not present

## 2024-11-07 DIAGNOSIS — R269 Unspecified abnormalities of gait and mobility: Secondary | ICD-10-CM | POA: Diagnosis not present

## 2024-11-07 MED ORDER — METHYLPHENIDATE HCL 5 MG PO TABS
5.0000 mg | ORAL_TABLET | Freq: Two times a day (BID) | ORAL | Status: DC
  Administered 2024-11-07 – 2024-11-13 (×12): 5 mg via ORAL
  Filled 2024-11-07 (×12): qty 1

## 2024-11-07 NOTE — Progress Notes (Signed)
 Physical Therapy Session Note  Patient Details  Name: [name] MRN: [id number] Date of Birth: 23-May-1961  Today's Date: 11/07/2024 PT Individual Time: 0930-1000 PT Individual Time Calculation (min): 30 min   Short Term Goals: Week 1:  PT Short Term Goal 1 (Week 1): Pt will perform bed mobility with overall SBA and improved initiation with no more than 2 vc. PT Short Term Goal 2 (Week 1): Pt will perform sit<>stand with overall SBA and improved initiation with no more than 2 vc. PT Short Term Goal 3 (Week 1): Pt will performstand pivot transfers with consistent CGA  and using RW with reduced vc for positioning. PT Short Term Goal 4 (Week 1): Pt will ambulate at least 75 ft using RW with consistent CGA. PT Short Term Goal 5 (Week 1): Pt will initiate step/ stair training.  Skilled Therapeutic Interventions/Progress Updates: Pt presented in recliner sleeping but easily aroused and agreeable to therapy. Pt denies pain during session. Pt required a significant amount of time to complete stand step transfer to w/c with RW and light minA for increased anterior weight shifting when completing sit to stand. Once in w/c pt transported to day room and participated in Cybex Kinetron 50cm/sec x 5 min for sustained task, reciprocal movement, and general conditioning. Pt able to complete ~2 min before requiring cues for continuation of task. Once completed pt transported back to room and returned to recliner in same manner as prior. Pt remained in recliner at end of session with call bell within reach and needs met.      Therapy Documentation Precautions:  Precautions Precautions: Fall Recall of Precautions/Restrictions: Impaired Precaution/Restrictions Comments: R weakness, IV abx through 12/3, urinary incontinence Restrictions Weight Bearing Restrictions Per Provider Order: No General:   Vital Signs:   Pain: Pain Assessment Pain Scale: 0-10 Pain Score: 0-No pain    Therapy/Group:  Individual Therapy  [name] 11/07/2024, 10:21 AM

## 2024-11-07 NOTE — Plan of Care (Signed)
  Problem: Consults Goal: RH BRAIN INJURY PATIENT EDUCATION Description: Description: See Patient Education module for eduction specifics Outcome: Progressing Goal: Nutrition Consult-if indicated Outcome: Progressing   Problem: RH BOWEL ELIMINATION Goal: RH STG MANAGE BOWEL WITH ASSISTANCE Description: STG Manage Bowel with mod I Assistance. Outcome: Progressing Goal: RH STG MANAGE BOWEL W/MEDICATION W/ASSISTANCE Description: STG Manage Bowel with Medication with mod I Assistance. Outcome: Progressing   Problem: RH BLADDER ELIMINATION Goal: RH STG MANAGE BLADDER WITH ASSISTANCE Description: STG Manage Bladder With toileting Assistance Outcome: Progressing   Problem: RH SKIN INTEGRITY Goal: RH STG SKIN FREE OF INFECTION/BREAKDOWN Description: Manage w min assist Outcome: Progressing Goal: RH STG MAINTAIN SKIN INTEGRITY WITH ASSISTANCE Description: STG Maintain Skin Integrity With min Assistance. Outcome: Progressing Goal: RH STG ABLE TO PERFORM INCISION/WOUND CARE W/ASSISTANCE Description: STG Able To Perform Incision/Wound Care With min Assistance. Outcome: Progressing   Problem: RH SAFETY Goal: RH STG ADHERE TO SAFETY PRECAUTIONS W/ASSISTANCE/DEVICE Description: STG Adhere to Safety Precautions With  cues Assistance/Device. Outcome: Progressing   Problem: RH COGNITION-NURSING Goal: RH STG USES MEMORY AIDS/STRATEGIES W/ASSIST TO PROBLEM SOLVE Description: STG Uses Memory Aids/Strategies With cues Assistance to Problem Solve. Outcome: Progressing   Problem: RH KNOWLEDGE DEFICIT BRAIN INJURY Goal: RH STG INCREASE KNOWLEDGE OF SELF CARE AFTER BRAIN INJURY Description: Patient and spouse will be able to manage care at discharge using educational resources independently Outcome: Progressing

## 2024-11-07 NOTE — Plan of Care (Signed)
 Goals downgraded due to significance of deficits  Problem: RH Cognition - SLP Goal: RH LTG Patient will demonstrate orientation with cues Description:  LTG:  Patient will demonstrate orientation to person/place/time/situation with cues (SLP)   Flowsheets (Taken 11/07/2024 1047) LTG: Patient will demonstrate orientation using cueing (SLP): Moderate Assistance - Patient 50 - 74%   Problem: RH Problem Solving Goal: LTG Patient will demonstrate problem solving for (SLP) Description: LTG:  Patient will demonstrate problem solving for basic/complex daily situations with cues  (SLP) Flowsheets (Taken 11/07/2024 1047) LTG: Patient will demonstrate problem solving for (SLP): Basic daily situations LTG Patient will demonstrate problem solving for: Moderate Assistance - Patient 50 - 74%   Problem: RH Memory Goal: LTG Patient will demonstrate ability for day to day (SLP) Description: LTG:   Patient will demonstrate ability for day to day recall/carryover during cognitive/linguistic activities with assist  (SLP) Flowsheets (Taken 11/07/2024 1047) LTG: Patient will demonstrate ability for day to day recall/carryover during cognitive/linguistic activities with assist (SLP): Moderate Assistance - Patient 50 - 74% Goal: LTG Patient will use memory compensatory aids to (SLP) Description: LTG:  Patient will use memory compensatory aids to recall biographical/new, daily complex information with cues (SLP) Flowsheets (Taken 11/07/2024 1047) LTG: Patient will use memory compensatory aids to (SLP): Moderate Assistance - Patient 50 - 74%   Problem: RH Attention Goal: LTG Patient will demonstrate this level of attention during functional activites (SLP) Description: LTG:  Patient will will demonstrate this level of attention during functional activites (SLP) Flowsheets (Taken 11/07/2024 1047) Patient will demonstrate during cognitive/linguistic activities the attention type of: Sustained LTG: Patient will demonstrate  this level of attention during cognitive/linguistic activities with assistance of (SLP): Moderate Assistance - Patient 50 - 74%   Problem: RH Awareness Goal: LTG: Patient will demonstrate awareness during functional activites type of (SLP) Description: LTG: Patient will demonstrate awareness during functional activites type of (SLP) Outcome: Not Applicable

## 2024-11-07 NOTE — Progress Notes (Signed)
 Occupational Therapy Session Note  Patient Details  Name: [name] MRN: [id number] Date of Birth: 08-02-1961  Today's Date: 11/07/2024 OT Individual Time: 1435-1458 OT Individual Time Calculation (min): 23 min    Short Term Goals: Week 1:  OT Short Term Goal 1 (Week 1): Pt will perform LB dressing and bathing with Mod A OT Short Term Goal 1 - Progress (Week 1): Met OT Short Term Goal 2 (Week 1): Pt will complete toileting with Mod A. OT Short Term Goal 2 - Progress (Week 1): Met  Skilled Therapeutic Interventions/Progress Updates:   Pt greeted sitting in recliner, no reports of pain. Session focused on theraputic exercises targeting forward weight-shift for carryover into functional transfers and management of posterior bias, details below: 1x5 reps forward-bend to pass medicine ball 1x3 reps alternating hand taps onto ball held ~3ft in front  Pt requires greater than reasonable amount of time to complete the above exercises, anticipate in part due to generalized fatigue per report. Multimodal cuing required for intiation/encouragement, 4# medicine ball utilized. Pt remained sitting in recliner, posey pad activated, and door open.   Therapy Documentation Precautions:  Precautions Precautions: Fall Recall of Precautions/Restrictions: Impaired Precaution/Restrictions Comments: R weakness, IV abx through 12/3, urinary incontinence Restrictions Weight Bearing Restrictions Per Provider Order: No   Therapy/Group: Individual Therapy  Nereida Habermann, OTR/L, MSOT  11/07/2024, 7:55 AM

## 2024-11-07 NOTE — Progress Notes (Signed)
 Occupational Therapy Note  Patient Details  Name: PALMER FAHRNER MRN: [id number] Date of Birth: Apr 24, 1961  Occupational Therapist participated in the interdisciplinary team conference, providing clinical information regarding the patient's current status, treatment goals, and weekly focus, including any barriers that need to be addressed. Please see the Inpatient Rehabilitation Team Conference and Plan of Care Update for further details.     Katheryn SHAUNNA Woodson 11/07/2024, 10:56 AM

## 2024-11-07 NOTE — Progress Notes (Signed)
 Physical Therapy Weekly Progress Note  Patient Details  Name: [name] MRN: [id number] Date of Birth: [birth year]  Beginning of progress report period: {Time; dates multiple:304500300} End of progress report period: {Time; dates multiple:304500300}  {CHL IP REHAB PT TIME CALCULATION:304800500}  Patient has met {number 1-5:22450} of {number 1-5:20334} short term goals.  ***  Patient continues to demonstrate the following deficits {impairments:3041632} and therefore will continue to benefit from skilled PT intervention to increase functional independence with mobility.  Patient {LTG progression:3041653}.  {plan of rjmz:6958345}  PT Short Term Goals {DUH:6958314}  Skilled Therapeutic Interventions/Progress Updates:      Therapy Documentation Precautions:  Precautions Precautions: Fall Recall of Precautions/Restrictions: Impaired Precaution/Restrictions Comments: R weakness, IV abx through 12/3, urinary incontinence Restrictions Weight Bearing Restrictions Per Provider Order: No General:   Vital Signs: Therapy Vitals Temp: 98.7 F (37.1 C) Temp Source: Oral Pulse Rate: 71 Resp: 18 BP: 99/68 Patient Position (if appropriate): Lying Oxygen  Therapy SpO2: 99 % O2 Device: Room Air Pain:   Vision/Perception     Mobility:   Locomotion :    Trunk/Postural Assessment :    Balance:   Exercises:   Other Treatments:     Therapy/Group: {Therapy/Group:3049007}  [name] DELENA Milliner 11/07/2024, 5:50 AM

## 2024-11-07 NOTE — Progress Notes (Signed)
 Speech Language Pathology Weekly Progress and Session Note  Patient Details  Name: [name] MRN: [id number] Date of Birth: 01/02/1961  Beginning of progress report period: October 31, 2024 End of progress report period: November 07, 2024  Today's Date: 11/07/2024 SLP Individual Time: 1230-1330 SLP Individual Time Calculation (min): 60 min  Short Term Goals: Week 1: SLP Short Term Goal 1 (Week 1): Pt will demonstrated improved oral containment with cup sups of thin liquids and reduce or eliminate anterior loss in 80% of opportunities. SLP Short Term Goal 1 - Progress (Week 1): Met SLP Short Term Goal 2 (Week 1): Pt will complete simple problem solving tasks with min verbal cues with >50% accuracy. SLP Short Term Goal 2 - Progress (Week 1): Not met SLP Short Term Goal 3 (Week 1): Pt will follow multi-step direction with min verbal cues with >50% accuracy. SLP Short Term Goal 3 - Progress (Week 1): Not met SLP Short Term Goal 4 (Week 1): Pt will attend to structured tasks for >5 minutes with min verbal cues. SLP Short Term Goal 4 - Progress (Week 1): Not met SLP Short Term Goal 5 (Week 1): Pt will participate in education regarding voice and vocal hygiene with min cues. SLP Short Term Goal 5 - Progress (Week 1): Met    New Short Term Goals: Week 2: SLP Short Term Goal 1 (Week 2): STG = LTG due to ELOS  Weekly Progress Updates: Pt has made limited gains and has met 2 of 5 STGs this reporting period due to improved voice and dysphagia. Currently, patient continues to require max-total A for cognitive tasks - LTG downgraded due to significance of cognitive deficits.  Pt/family eduction ongoing. Pt would benefit from continued ST intervention to maximize cognition in order to maximize functional independence at d/c.    Intensity: Minumum of 1-2 x/day, 30 to 90 minutes Frequency: 3 to 5 out of 7 days Duration/Length of Stay: 12/11 Treatment/Interventions: Cognitive  remediation/compensation;Cueing hierarchy;Functional tasks;Patient/family education;Oral motor exercises;Therapeutic Activities;Speech/Language facilitation;Internal/external aids;Dysphagia/aspiration precaution training   Daily Session  Skilled Therapeutic Interventions:  Skilled therapy session focused on dysphagia and cognitive goals. SLP facilitated session by observing patient with regular/thin liquid lunch tray. Patient with adequate oral containment, however mildly prolonged mastication.. No s/sx of aspiration present. Of note, patient with increased vocal quality this date - no longer speaking in a whipser and with adequate voicing throughout session. Continue current diet. SLP targeted cognitive goals through orientation task. Patient oriented to self and location independently and time with use of external aid (calendar) and maxA. Patient disoriented to medical situation therefore SLP re-educated. In remaining minutes of session, SLP targeted multistep directions. Patient achieved 60% accuracy given modA (repetition of directions, extra processing time) - patients difficulty with this task is likely due to cognition rather than language. At the end of the session, SLP targeted problem solving and long term memory skills through prompting patient to match month with holiday. Patient required max-total A to complete task. Patient left in chair with alarm set and call bell in reach. Continue POC.   Pain None reported   Therapy/Group: Individual Therapy  Lauria Depoy M.A., CCC-SLP 11/07/2024, 1:29 PM

## 2024-11-07 NOTE — Progress Notes (Signed)
 Occupational Therapy Weekly Progress Note  Patient Details  Name: [name] MRN: [id number] Date of Birth: [birth year]  Beginning of progress report period: October 31, 2024 End of progress report period: November 07, 2024  Today's Date: 11/07/2024 OT Individual Time: 1107-1200 OT Individual Time Calculation (min): 53 min    Patient has met 2 of 2 short term goals. [name] is making slow progress towards reaching his LTGs. He is completing UB ADLs with SUP and LB ADLs and toileting with MIN A. He is completing ambulatory transfers using a RW with SBA-CGA. He is most limited by his functional cognition skills demonstrating slowed processing, slowed initiation, poor attention, decreased awareness, poor memory, and decreased problem solving requiring significantly increased amount of time to complete ADLs and functional tasks.   Patient continues to demonstrate the following deficits: muscle weakness, decreased cardiorespiratoy endurance, decreased visual perceptual skills, decreased motor planning, decreased initiation, decreased attention, decreased awareness, decreased problem solving, decreased safety awareness, decreased memory, and delayed processing, central origin, and decreased standing balance and decreased balance strategies and therefore will continue to benefit from skilled OT intervention to enhance overall performance with BADL and Reduce care partner burden.  Patient progressing toward long term goals..  Continue plan of care.  OT Short Term Goals Week 1:  OT Short Term Goal 1 (Week 1): Pt will perform LB dressing and bathing with Mod A OT Short Term Goal 1 - Progress (Week 1): Met OT Short Term Goal 2 (Week 1): Pt will complete toileting with Mod A. OT Short Term Goal 2 - Progress (Week 1): Met Week 2:  OT Short Term Goal 1 (Week 2): STG=LTG d/t ELOS  Skilled Therapeutic Interventions/Progress Updates:     Pt received deeply sleeping in recliner, waking upon OT arrival  requiring increased time to initiate participation in session. Pt reporting 0/10 pain- OT offering intermittent rest breaks, repositioning, and therapeutic support to optimize participation in therapy session. Pt receptive to using restroom at beginning of session to adhere to timed toileting schedule. He completed functional mobility to/from bathroom using RW with SBA-CGA with significantly increased amount of time required d/t slowed cadence, decreased step height, and decreased step length. Increased time provided on toilet, however Pt unable to void or have BM at this time. Clothing management completed in standing with CGA. RN in/out to provide medications. Transported Pt to therapy gym in wc for time management and energy conservation. Engaged Pt in completing functional cognition card sorting activity in standing position to work on selective attention, problem solving, standing balance, and standing tolerance. Pt tasked with sorting cards by 1) color, 2) numbers, and 3) sequence from 1-12. He required MIN-MOD gestural or verbal cues to notice mistakes, however then able to independently correct mistake. Tolerated standing >5 minutes during each sorting activity with CGA and seated rest breaks provided between each. MIN-MOD multimodal cues required for attention and redirection to task. Transported Pt back to room total A in wc. Stand pivot wc > recliner using RW CGA. Pt was left resting in recliner with call bell in reach, seatbelt alarm on, and all needs met.    Therapy Documentation Precautions:  Precautions Precautions: Fall Recall of Precautions/Restrictions: Impaired Precaution/Restrictions Comments: R weakness, IV abx through 12/3, urinary incontinence Restrictions Weight Bearing Restrictions Per Provider Order: No  Therapy/Group: Individual Therapy  [name] 11/07/2024, 7:26 AM

## 2024-11-07 NOTE — Progress Notes (Signed)
 PROGRESS NOTE   Subjective/Complaints:  Last day of IV Acyclovir , has urinary incontinence   ROS: Limited due to cognitive/behavioral    Objective:   No results found. Recent Labs    11/05/24 0432  WBC 8.6  HGB 12.6*  HCT 36.0*  PLT 155    Recent Labs    11/05/24 0432  NA 135  K 3.7  CL 102  CO2 25  GLUCOSE 95  BUN 12  CREATININE 0.86  CALCIUM  8.4*    Intake/Output Summary (Last 24 hours) at 11/07/2024 1046 Last data filed at 11/07/2024 9177 Gross per 24 hour  Intake 477 ml  Output --  Net 477 ml        Physical Exam: Vital Signs Blood pressure 99/68, pulse 71, temperature 98.7 F (37.1 C), temperature source Oral, resp. rate 18, height 5' 9 (1.753 m), weight 77.3 kg, SpO2 99%.  General: No acute distress Mood and affect are appropriate Heart: Regular rate and rhythm no rubs murmurs or extra sounds Lungs: Clear to auscultation, breathing unlabored, no rales or wheezes Abdomen: Positive bowel sounds, soft nontender to palpation, nondistended Extremities: No clubbing, cyanosis, or edema Skin: No evidence of breakdown, no evidence of rash   Neurologic: mild lethargy , oriented to date but not month , very quiet voice hypophonia , limited insight and awareness.  cranial nerves II through XII intact, motor moving all 4 extremities to gravity and resistance--   Musculoskeletal: No joint swelling noted. No pain with ROM    Assessment/Plan: 1. Functional deficits which require 3+ hours per day of interdisciplinary therapy in a comprehensive inpatient rehab setting. Physiatrist is providing close team supervision and 24 hour management of active medical problems listed below. Physiatrist and rehab team continue to assess barriers to discharge/monitor patient progress toward functional and medical goals  Care Tool:  Bathing    Body parts bathed by patient: Right arm, Left arm, Chest, Abdomen, Face    Body parts bathed by helper: Front perineal area, Buttocks, Right upper leg, Left upper leg, Right lower leg, Left lower leg     Bathing assist Assist Level: Moderate Assistance - Patient 50 - 74%     Upper Body Dressing/Undressing Upper body dressing   What is the patient wearing?: Hospital gown only    Upper body assist Assist Level: Moderate Assistance - Patient 50 - 74%    Lower Body Dressing/Undressing Lower body dressing      What is the patient wearing?: Pants, Incontinence brief     Lower body assist Assist for lower body dressing: Dependent - Patient 0%     Toileting Toileting    Toileting assist Assist for toileting: Dependent - Patient 0%     Transfers Chair/bed transfer  Transfers assist     Chair/bed transfer assist level: Minimal Assistance - Patient > 75%     Locomotion Ambulation   Ambulation assist      Assist level: Contact Guard/Touching assist Assistive device: Walker-rolling Max distance: 125'   Walk 10 feet activity   Assist     Assist level: Contact Guard/Touching assist Assistive device: Walker-rolling   Walk 50 feet activity   Assist Walk 50 feet with  2 turns activity did not occur: Safety/medical concerns  Assist level: Contact Guard/Touching assist Assistive device: Walker-rolling    Walk 150 feet activity   Assist Walk 150 feet activity did not occur: Safety/medical concerns         Walk 10 feet on uneven surface  activity   Assist Walk 10 feet on uneven surfaces activity did not occur: Safety/medical concerns         Wheelchair     Assist Is the patient using a wheelchair?: Yes Type of Wheelchair: Manual    Wheelchair assist level: Maximal Assistance - Patient 25 - 49% Max wheelchair distance: 250 ft    Wheelchair 50 feet with 2 turns activity    Assist        Assist Level: Maximal Assistance - Patient 25 - 49%   Wheelchair 150 feet activity     Assist      Assist  Level: Maximal Assistance - Patient 25 - 49%   Blood pressure 99/68, pulse 71, temperature 98.7 F (37.1 C), temperature source Oral, resp. rate 18, height 5' 9 (1.753 m), weight 77.3 kg, SpO2 99%.  Medical Problem List and Plan: 1. 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 Formulized individual treatment plan based on medical history, underlying problem and comorbidities.  2.  Antithrombotics: -DVT/anticoagulation:  Pharmaceutical: Lovenox              -antiplatelet therapy:  3. Pain Management: tylenol  prn 4. Mood/Behavior/Sleep: LCSW to follow for evaluation and support.             --Melatonin prn for insomnia             -antipsychotic agents: N/A 5. Neuropsych/cognition: This patient is not fully capable of making decisions on his own behalf. 6. Skin/Wound Care: Routine pressure relief  measures.  7. Fluids/Electrolytes/Nutrition: Monitor I/O. Check CMET in am. Monitor renal status closely  -11/30 intake was recorded yesterday and was 75-100% for all meals   Normal prealbumin on 12/1 8.  Herpes encephalitis: Continue IV acyclovir  thru 11/07/24             --IVF for hydration while on ayclovir to prevent renal injury.             --was on decadron  -tapered off  9. Seizures: Continue Keppra  1000 mg bid 10. Pre-renal azotemia: Encourage fluid intake--may need to increase IVF- mild cont current IVF   11/29 renal function/labs stable to  improved.     Latest Ref Rng & Units 11/05/2024    4:32 AM 11/03/2024    2:26 AM 11/01/2024    4:05 AM  BMP  Glucose 70 - 99 mg/dL 95  95  93   BUN 8 - 23 mg/dL 12  14  19    Creatinine 0.61 - 1.24 mg/dL 9.13  9.14  9.05  Sodium 135 - 145 mmol/L 135  136  134   Potassium 3.5 - 5.1 mmol/L 3.7  3.6  3.9   Chloride 98 - 111 mmol/L 102  103  102   CO2 22 - 32 mmol/L 25  24  25    Calcium  8.9 - 10.3 mg/dL 8.4  8.4  8.3     11. H/o Depression: was on Zoloft  50 mg w/Remeron  7.5 mg daily  12.  Hypocalcemia: Boderline low with ionized Calcium  1.14. Caltrate added.  13.  Steroid-induced hyperglycemia: resolved off steroid     Serum glucose ok at 111  14. GERD: continue PPI  15. Hypophonia  11/27- Hopefully SLP can help with this 16. Constipation  11/28- LBM    11/29 scheduled MiraLAX --increased to BID   -push po intake  8 days A FACE TO FACE EVALUATION WAS PERFORMED  Prentice FORBES Compton 11/07/2024, 10:46 AM

## 2024-11-07 NOTE — Progress Notes (Addendum)
 Patient ID: [name], male   DOB: [birth year], 63 y.o.   MRN: [id number]  Spoke with wife via telephone to discuss team conference goals of supervision due to cognition and initiation issues and target discharge date of 12/11. Have scheduled for her to come in for training 12/9 from 1;00-4:00 and any other time she is here. Informed her pt needs clothes and she is aware of this and bring them when she comes to visit. Will let team know and work on discharge needs. Pt aware of this and in agreement

## 2024-11-07 NOTE — Progress Notes (Signed)
 Physical Therapy Session Note  Patient Details  Name: [name] MRN: [id number] Date of Birth: 11-Feb-1961  Today's Date: 11/06/2024 PT Individual Time: 8584-8496 PT Individual Time Calculation (min): 48 min  Short Term Goals: Week 1:  PT Short Term Goal 1 (Week 1): Pt will perform bed mobility with overall SBA and improved initiation with no more than 2 vc. PT Short Term Goal 2 (Week 1): Pt will perform sit<>stand with overall SBA and improved initiation with no more than 2 vc. PT Short Term Goal 3 (Week 1): Pt will performstand pivot transfers with consistent CGA  and using RW with reduced vc for positioning. PT Short Term Goal 4 (Week 1): Pt will ambulate at least 75 ft using RW with consistent CGA. PT Short Term Goal 5 (Week 1): Pt will initiate step/ stair training.   Skilled Therapeutic Interventions/Progress Updates:  Patient supine in bed on entrance to room. Patient alert and agreeable to PT session.   Patient with no pain complaint at start of session.  Therapeutic Activity: Bed Mobility: Pt performed supine > sit with improved initiation to get to [name] with supervision. Minimal cueing required to initiate Transfers: Pt requires extensive multimodal cueing to rise to stand from seated position on [name]. Finally performs rist to stand with CGA for improved forward lean and cueing to promote hand positioning. Throughout rest of session, pt does not require as much time to initiate but able to perform with supervision/ CGA when positioned correctly and not pulling at RW.  Once standing, pt noted to be incontinent and when questioned is unaware. Ambulatory transfer to toilet. Supervision to transfer. Time on toilet and pt noted to have small bowel movement. Appears unaware. Once standing for pericare, pt continues to be unaware of having had BM. Provided pt with toilet paper and he performs with supervision/ CGA but when soiled, pt attempts to fold and reuse despite thorough soiling.  Requires extensive assist to clean hands with washcloth appropriately and then MaxA to perform appropriate pericare. Clothing mgmt performed with MaxA.   Ambulated to sink and is able to wash hands with vc for needed soap. Washes and dries appropriately. Return ambulatory transfer to bed with Supervision/ CGA for RW mgmt. Extensive cues required again to return to supine with supervision.   Patient supine in bed at end of session with brakes locked, bed alarm set, and all needs within reach. NT notified as to timed toileting schedule/ clock on door.    Therapy Documentation Precautions:  Precautions Precautions: Fall Recall of Precautions/Restrictions: Impaired Precaution/Restrictions Comments: R weakness, IV abx through 12/3, urinary incontinence Restrictions Weight Bearing Restrictions Per Provider Order: No  Pain: No pain indicated this session.   Therapy/Group: Individual Therapy  [name] DELENA Milliner PT, DPT, CSRS 11/06/2024, 5:39 PM

## 2024-11-07 NOTE — Patient Care Conference (Signed)
 Inpatient RehabilitationTeam Conference and Plan of Care Update Date: 11/07/2024   Time: 10:45 AM    Patient Name: [name]      Medical Record Number: [id number]  Date of Birth: 05-02-1961 Sex: Male         Room/Bed: 4W15C/4W15C-01 Payor Info: Payor: BLUE CR[employer] BLUE [employer]IELD / Plan: BCBS COMM PPO / Product Type: *No Product type* /    Admit Date/Time:  10/30/2024  4:54 PM  Primary Diagnosis:  Herpesviral encephalitis  Hospital Problems: Principal Problem:   Herpesviral encephalitis    Expected Discharge Date: Expected Discharge Date: 11/15/24  Team Members Present: Physician leading conference: Dr. Prentice Compton Social Worker Present: Rhoda Clement, LCSW Nurse Present: Barnie Ronde, RN PT Present: Recardo Milliner, PT OT Present: Katheryn Mines, OT SLP Present: Blaise Alderman, SLP     Current Status/Progress Goal Weekly Team Focus  Bowel/Bladder      Incontinent of bowel and bladder    Continent of bowel and bladder    Toileting protocol activation; assist to bathroom  Swallow/Nutrition/ Hydration   reg/thin           ADL's   SUP overall for ADLs, intermittent MIN A required for toileting to ensure cleanliness, SUP-CGA transfers using RW // Barriers: extremely slowed processing and task initation, MAX multimodal cues required for attention/initation, poor awareness into deficits, disoriented, family has not come in   SUP   functional cognition, familiar activities, ADL retraining, functional transfer trainng, Pt education    Mobility   Bed mobility = supervision but SUPER slow, transfers = CGA for improved forward lean, Ambulation = with RW up to 120 ft and CGA   overall supervision  Barriers: cognition, slow to initiatie, slow to process /// Work On: standing balance, quality of gait, initiation and motor processing, transfer training, family educ    Communication   dysphonic            Safety/Cognition/ Behavioral Observations  max-total A   modA    orientation, problem solving, memory, attention, awareness    Pain      N/a          Skin     N/a            Discharge Planning:  HOme with wife who works from home and will need to come in for family education prior to discharge. Pt is physically doing well but his main issue are cognition   Team Discussion: Patient admitted post HSV encephalitis with disorientation, slow initiation, poor po intake and delayed processing. Completing IV antiviral treatment today and changing to HS IVF.  Patient on target to meet rehab goals: Currently completes sponge bathing and dressing. Completes transfers with supervision. Able to ambulate up to 120' without an assistive device but is easily distracted and slow moving.  *See Care Plan and progress notes for long and short-term goals.   Revisions to Treatment Plan:  Downgraded SLP goals to mod assist Ritalin Trial   Teaching Needs: Safety, medications, transfers, toileting, etc.   Current Barriers to Discharge: Decreased caregiver support  Possible Resolutions to Barriers: Family education     Medical Summary               I attest that I was present, lead the team conference, and concur with the assessment and plan of the team.   Ronde Barnie B 11/07/2024, 3:52 PM

## 2024-11-08 DIAGNOSIS — R269 Unspecified abnormalities of gait and mobility: Secondary | ICD-10-CM | POA: Diagnosis not present

## 2024-11-08 DIAGNOSIS — R498 Other voice and resonance disorders: Secondary | ICD-10-CM | POA: Diagnosis not present

## 2024-11-08 DIAGNOSIS — B004 Herpesviral encephalitis: Secondary | ICD-10-CM | POA: Diagnosis not present

## 2024-11-08 LAB — BASIC METABOLIC PANEL WITH GFR
Anion gap: 10 (ref 5–15)
BUN: 20 mg/dL (ref 8–23)
CO2: 26 mmol/L (ref 22–32)
Calcium: 8.5 mg/dL — ABNORMAL LOW (ref 8.9–10.3)
Chloride: 99 mmol/L (ref 98–111)
Creatinine, Ser: 1.29 mg/dL — ABNORMAL HIGH (ref 0.61–1.24)
GFR, Estimated: >60 mL/min
Glucose, Bld: 95 mg/dL (ref 70–99)
Potassium: 3.8 mmol/L (ref 3.5–5.1)
Sodium: 135 mmol/L (ref 135–145)

## 2024-11-08 LAB — CBC
HCT: 35.6 % — ABNORMAL LOW (ref 39.0–52.0)
Hemoglobin: 12.5 g/dL — ABNORMAL LOW (ref 13.0–17.0)
MCH: 34.3 pg — ABNORMAL HIGH (ref 26.0–34.0)
MCHC: 35.1 g/dL (ref 30.0–36.0)
MCV: 97.8 fL (ref 80.0–100.0)
Platelets: 158 K/uL (ref 150–400)
RBC: 3.64 MIL/uL — ABNORMAL LOW (ref 4.22–5.81)
RDW: 16.3 % — ABNORMAL HIGH (ref 11.5–15.5)
WBC: 6.8 K/uL (ref 4.0–10.5)
nRBC: 0 % (ref 0.0–0.2)

## 2024-11-08 NOTE — Progress Notes (Signed)
 Pt has completed IV acyclovir . Ok to stop NS infusion per Dr. Carilyn.  Sergio Batch, PharmD, BCIDP, AAHIVP, CPP Infectious Disease Pharmacist 11/08/2024 9:34 AM

## 2024-11-08 NOTE — Progress Notes (Signed)
 Speech Language Pathology Daily Session Note  Patient Details  Name: [name] MRN: [id number] Date of Birth: 1961/01/12  Today's Date: 11/08/2024 SLP Individual Time: 1240-1323 SLP Individual Time Calculation (min): 43 min  Short Term Goals: Week 2: SLP Short Term Goal 1 (Week 2): STG = LTG due to ELOS  Skilled Therapeutic Interventions: Skilled therapy session focused on cognitive goals. SLP facilitated session by targeting orientation. This date, patient was independently oriented to self and situation and oriented to time given calendar and minA. Patient continues to be disoriented to situation. SLP targeted problem solving and attention through card sorting task. Patient required supervisionA to sort cards according to color and minA to sort according to shape and number. Patient required extra processing time and repetition of directions throughout task. At the end of the session, patient required modA to recall how to contact nursing and maxA to recall reasons (bathroom, pain, etc). Patient left in chair with alarm set and call bell in reach. Continue POC   Pain denies  Therapy/Group: Individual Therapy  Abagael Kramm M.A., CCC-SLP 11/08/2024, 7:38 AM

## 2024-11-08 NOTE — Progress Notes (Signed)
 Physical Therapy Session Note  Patient Details  Name: AXTON CIHLAR MRN: [id number] Date of Birth: 22-May-1961  Today's Date: 11/08/2024 PT Individual Time: 0832-0946 PT Individual Time Calculation (min): 74 min   Short Term Goals: Week 1:  PT Short Term Goal 1 (Week 1): Pt will perform bed mobility with overall SBA and improved initiation with no more than 2 vc. PT Short Term Goal 1 - Progress (Week 1): Met PT Short Term Goal 2 (Week 1): Pt will perform sit<>stand with overall SBA and improved initiation with no more than 2 vc. PT Short Term Goal 2 - Progress (Week 1): Met PT Short Term Goal 3 (Week 1): Pt will performstand pivot transfers with consistent CGA  and using RW with reduced vc for positioning. PT Short Term Goal 3 - Progress (Week 1): Met PT Short Term Goal 4 (Week 1): Pt will ambulate at least 75 ft using RW with consistent CGA. PT Short Term Goal 4 - Progress (Week 1): Met PT Short Term Goal 5 (Week 1): Pt will initiate step/ stair training. PT Short Term Goal 5 - Progress (Week 1): Met Week 2:  PT Short Term Goal 1 (Week 2): STG = LTG d/t ELOS  Skilled Therapeutic Interventions/Progress Updates:  Pt presents in bed awake and alert. Engaged in discussion regarding what he wants/needs to work on in order to go home - decreased insight into deficits noted. Significant amount of time to initiate movement for coming to [name] ~ 15 min with multimodal cues. Pt able to do with use of rails with overall CGA (CGA was to initiate movement). S sitting balance [name]. Pt declines donning paper scrubs and request to stay in gown. Min A for initial sit > stand with RW with cues for hand placement and technique and transfer into the w/c with focus on technique and overall initiation. Transported to gym for time management. Focused on gait training for functional strengthening, endurance, and overall mobility - pt again requires significant amount of time and max cues for sit > stand with CGA  overall and then gait x 125' with slow gait speed, flexed posture and cues for positioning of RW especially during turns and obstacle negotiation. Transitioned to stair negotiation without break to continue momentum of movement and pt ascends/descends 4 stairs with B handrails with overall CGA to min A, reciprocal pattern self selected. Pt reports he does have 1 step to navigate at the back of his house. Returned to room and transferred to recliner - again significant amount of time for processing and initation and performed min A transfer without device facilitation for weightshifting and balance. Noted to have wet brief so retrieved clean brief once pt seated. Pt fell asleep and required time to awaken again and significant time for initiation for sit > stand with CGA overall once he did. S for standing balance with RW for UE support and pt assisted with 1 UE to hold brief while PT fastened. Returned seated back to recliner with all needs in reach.   Therapy Documentation Precautions:  Precautions Precautions: Fall Recall of Precautions/Restrictions: Impaired Precaution/Restrictions Comments: R weakness, IV abx through 12/3, urinary incontinence Restrictions Weight Bearing Restrictions Per Provider Order: No  Pain: Pain Assessment Pain Scale: 0-10 Pain Score: 0-No pain     Therapy/Group: Individual Therapy  Elnor Pizza Sherrell Pizza WENDI Elnor, PT, DPT, CBIS  11/08/2024, 9:47 AM

## 2024-11-08 NOTE — Plan of Care (Signed)
  Problem: Consults Goal: RH BRAIN INJURY PATIENT EDUCATION Description: Description: See Patient Education module for eduction specifics Outcome: Progressing Goal: Nutrition Consult-if indicated Outcome: Progressing   Problem: RH BOWEL ELIMINATION Goal: RH STG MANAGE BOWEL WITH ASSISTANCE Description: STG Manage Bowel with mod I Assistance. Outcome: Progressing Goal: RH STG MANAGE BOWEL W/MEDICATION W/ASSISTANCE Description: STG Manage Bowel with Medication with mod I Assistance. Outcome: Progressing   Problem: RH BLADDER ELIMINATION Goal: RH STG MANAGE BLADDER WITH ASSISTANCE Description: STG Manage Bladder With toileting Assistance Outcome: Progressing   Problem: RH SKIN INTEGRITY Goal: RH STG SKIN FREE OF INFECTION/BREAKDOWN Description: Manage w min assist Outcome: Progressing Goal: RH STG MAINTAIN SKIN INTEGRITY WITH ASSISTANCE Description: STG Maintain Skin Integrity With min Assistance. Outcome: Progressing Goal: RH STG ABLE TO PERFORM INCISION/WOUND CARE W/ASSISTANCE Description: STG Able To Perform Incision/Wound Care With min Assistance. Outcome: Progressing   Problem: RH SAFETY Goal: RH STG ADHERE TO SAFETY PRECAUTIONS W/ASSISTANCE/DEVICE Description: STG Adhere to Safety Precautions With  cues Assistance/Device. Outcome: Progressing   Problem: RH COGNITION-NURSING Goal: RH STG USES MEMORY AIDS/STRATEGIES W/ASSIST TO PROBLEM SOLVE Description: STG Uses Memory Aids/Strategies With cues Assistance to Problem Solve. Outcome: Progressing   Problem: RH KNOWLEDGE DEFICIT BRAIN INJURY Goal: RH STG INCREASE KNOWLEDGE OF SELF CARE AFTER BRAIN INJURY Description: Patient and spouse will be able to manage care at discharge using educational resources independently Outcome: Progressing

## 2024-11-08 NOTE — Progress Notes (Signed)
 Physical Therapy Note  Patient Details  Name: [name] MRN: [id number] Date of Birth: [birth year] Today's Date: 11/07/2024   Physical Therapist participated in the interdisciplinary team conference, providing clinical information regarding the patient's current status, treatment goals, and weekly focus, including any barriers that need to be addressed. Please see the Inpatient Rehabilitation Team Conference and Plan of Care Update for further details.    [name] DELENA Milliner PT, DPT, CSRS 11/08/2024, 7:31 AM

## 2024-11-08 NOTE — Progress Notes (Signed)
 PROGRESS NOTE   Subjective/Complaints:  Off acyclovir , discussed d/c date Pt reported having boiled and scrambled eggs, bacon and sausage for breakfast this am   ROS: Limited due to cognitive/behavioral    Objective:   No results found. Recent Labs    11/08/24 0522  WBC 6.8  HGB 12.5*  HCT 35.6*  PLT 158    Recent Labs    11/08/24 0522  NA 135  K 3.8  CL 99  CO2 26  GLUCOSE 95  BUN 20  CREATININE 1.29*  CALCIUM  8.5*    Intake/Output Summary (Last 24 hours) at 11/08/2024 0810 Last data filed at 11/08/2024 0324 Gross per 24 hour  Intake 2442.95 ml  Output --  Net 2442.95 ml        Physical Exam: Vital Signs Blood pressure 99/72, pulse 74, temperature 97.8 F (36.6 C), temperature source Oral, resp. rate 18, height 5' 9 (1.753 m), weight 77.3 kg, SpO2 98%.  General: No acute distress Mood and affect are appropriate Heart: Regular rate and rhythm no rubs murmurs or extra sounds Lungs: Clear to auscultation, breathing unlabored, no rales or wheezes Abdomen: Positive bowel sounds, soft nontender to palpation, nondistended Extremities: No clubbing, cyanosis, or edema Skin: No evidence of breakdown, no evidence of rash   Neurologic: mild lethargy , oriented to hospital in [place] but not time , very quiet voice hypophonia , limited insight and awareness.  cranial nerves II through XII intact, motor moving all 4 extremities to gravity and resistance--  Musculoskeletal: No joint swelling noted. No pain with ROM    Assessment/Plan: 1. Functional deficits which require 3+ hours per day of interdisciplinary therapy in a comprehensive inpatient rehab setting. Physiatrist is providing close team supervision and 24 hour management of active medical problems listed below. Physiatrist and rehab team continue to assess barriers to discharge/monitor patient progress toward functional and medical goals  Care  Tool:  Bathing    Body parts bathed by patient: Right arm, Left arm, Chest, Abdomen, Face   Body parts bathed by helper: Front perineal area, Buttocks, Right upper leg, Left upper leg, Right lower leg, Left lower leg     Bathing assist Assist Level: Moderate Assistance - Patient 50 - 74%     Upper Body Dressing/Undressing Upper body dressing   What is the patient wearing?: Hospital gown only    Upper body assist Assist Level: Moderate Assistance - Patient 50 - 74%    Lower Body Dressing/Undressing Lower body dressing      What is the patient wearing?: Pants, Incontinence brief     Lower body assist Assist for lower body dressing: Dependent - Patient 0%     Toileting Toileting    Toileting assist Assist for toileting: Dependent - Patient 0%     Transfers Chair/bed transfer  Transfers assist     Chair/bed transfer assist level: Minimal Assistance - Patient > 75%     Locomotion Ambulation   Ambulation assist      Assist level: Contact Guard/Touching assist Assistive device: Walker-rolling Max distance: 125'   Walk 10 feet activity   Assist     Assist level: Contact Guard/Touching assist Assistive device: Walker-rolling  Walk 50 feet activity   Assist Walk 50 feet with 2 turns activity did not occur: Safety/medical concerns  Assist level: Contact Guard/Touching assist Assistive device: Walker-rolling    Walk 150 feet activity   Assist Walk 150 feet activity did not occur: Safety/medical concerns         Walk 10 feet on uneven surface  activity   Assist Walk 10 feet on uneven surfaces activity did not occur: Safety/medical concerns         Wheelchair     Assist Is the patient using a wheelchair?: Yes Type of Wheelchair: Manual    Wheelchair assist level: Maximal Assistance - Patient 25 - 49% Max wheelchair distance: 250 ft    Wheelchair 50 feet with 2 turns activity    Assist        Assist Level: Maximal  Assistance - Patient 25 - 49%   Wheelchair 150 feet activity     Assist      Assist Level: Maximal Assistance - Patient 25 - 49%   Blood pressure 99/72, pulse 74, temperature 97.8 F (36.6 C), temperature source Oral, resp. rate 18, height 5' 9 (1.753 m), weight 77.3 kg, SpO2 98%.  Medical Problem List and Plan: 1. 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  Antithrombotics: -DVT/anticoagulation:  Pharmaceutical: Lovenox              -antiplatelet therapy:  3. Pain Management: tylenol  prn 4. Mood/Behavior/Sleep: LCSW to follow for evaluation and support.             --Melatonin prn for insomnia             -antipsychotic agents: N/A 5. Neuropsych/cognition: This patient is not fully capable of making decisions on his own behalf. 6. Skin/Wound Care: Routine pressure relief  measures.  7. Fluids/Electrolytes/Nutrition: Monitor I/O. Check CMET in am. Monitor renal status closely  -11/30 intake was recorded yesterday and was 75-100% for all meals   Normal prealbumin on 12/1 8.  Herpes encephalitis: Continue IV acyclovir  thru 11/07/24             --IVF for hydration while on ayclovir to prevent renal injury.             --was on decadron  -tapered off  9. Seizures: Continue Keppra  1000 mg bid 10. Pre-renal azotemia: Encourage fluid intake--may need to increase IVF- mild cont current IVF   11/29 renal function/labs stable to improved.     Latest Ref Rng & Units 11/08/2024    5:22 AM 11/05/2024    4:32 AM 11/03/2024    2:26 AM  BMP  Glucose 70 - 99  mg/dL 95  95  95   BUN 8 - 23 mg/dL 20  12  14    Creatinine 0.61 - 1.24 mg/dL 8.70  9.13  9.14   Sodium 135 - 145 mmol/L 135  135  136   Potassium 3.5 -  5.1 mmol/L 3.8  3.7  3.6   Chloride 98 - 111 mmol/L 99  102  103   CO2 22 - 32 mmol/L 26  25  24    Calcium  8.9 - 10.3 mg/dL 8.5  8.4  8.4     11. H/o Depression: was on Zoloft  50 mg w/Remeron  7.5 mg daily  12.  Hypocalcemia: Boderline low with ionized Calcium  1.14. Caltrate added.  13.  Steroid-induced hyperglycemia: resolved off steroid     Serum glucose ok at 111  14. GERD: continue PPI  15. Hypophonia  11/27- Hopefully SLP can help with this 16. Constipation  11/28- LBM    11/29 scheduled MiraLAX --increased to BID   -push po intake  9 days A FACE TO FACE EVALUATION WAS PERFORMED  [name] 11/08/2024, 8:10 AM

## 2024-11-08 NOTE — Progress Notes (Signed)
 Physical Therapy Session Note  Patient Details  Name: [name] MRN: [id number] Date of Birth: Mar 05, 1961  Today's Date: 11/08/2024 PT Individual Time: 1135-1205 PT Individual Time Calculation (min): 30 min   Short Term Goals: Week 1:  PT Short Term Goal 1 (Week 1): Pt will perform bed mobility with overall SBA and improved initiation with no more than 2 vc. PT Short Term Goal 1 - Progress (Week 1): Met PT Short Term Goal 2 (Week 1): Pt will perform sit<>stand with overall SBA and improved initiation with no more than 2 vc. PT Short Term Goal 2 - Progress (Week 1): Met PT Short Term Goal 3 (Week 1): Pt will performstand pivot transfers with consistent CGA  and using RW with reduced vc for positioning. PT Short Term Goal 3 - Progress (Week 1): Met PT Short Term Goal 4 (Week 1): Pt will ambulate at least 75 ft using RW with consistent CGA. PT Short Term Goal 4 - Progress (Week 1): Met PT Short Term Goal 5 (Week 1): Pt will initiate step/ stair training. PT Short Term Goal 5 - Progress (Week 1): Met Week 2:  PT Short Term Goal 1 (Week 2): STG = LTG d/t ELOS  Skilled Therapeutic Interventions/Progress Updates: Pt presented in recliner with family present agreeable to therapy. Pt denies pain. Session focused on transfers, gait and endurance. Pt required increased time and multiple multimodal cues for initiation of activity. Pt required multiple attempts but was able to stand with CGA and noted bracing BLE against recliner. Pt then ambulated to day room with CGA with pt distracted by busy environment. Pt transferred to NuStep and participated in pace partner at 26 SPM, Pt tended to average 50 SPM and with cues and encouragement able to achieve 60 SPM but unable to sustain. Once completed pt transferred in same manner as prior and ambulated back to room. Pt returned to recliner at end of session and remained in recliner with call bell within reach and family present.    Therapy  Documentation Precautions:  Precautions Precautions: Fall Recall of Precautions/Restrictions: Impaired Precaution/Restrictions Comments: R weakness, IV abx through 12/3, urinary incontinence Restrictions Weight Bearing Restrictions Per Provider Order: No General:      Therapy/Group: Individual Therapy  Kera Deacon 11/08/2024, 4:02 PM

## 2024-11-08 NOTE — Progress Notes (Signed)
 Occupational Therapy Session Note  Patient Details  Name: [name] MRN: [id number] Date of Birth: Dec 05, 1961  Session 1:  Today's Date: 11/08/2024 OT Individual Time: 1001-1057 OT Individual Time Calculation (min): 56 min   Session 2: Today's Date: 11/08/2024 OT Individual Time: 8584-8545 OT Individual Time Calculation (min): 39 min   Short Term Goals: Week 2:  OT Short Term Goal 1 (Week 2): STG=LTG d/t ELOS  Skilled Therapeutic Interventions/Progress Updates:     AM Session:  Pt received deeply sleeping in recliner requiring increased time to wake and initiate participation in OT session reporting 0/10 pain- OT offering intermittent rest breaks, repositioning, and therapeutic support to optimize participation in therapy session. Pt receptive to taking shower and completing morning routine. Focused session on ADL retraining and functional cognition. He completed ambulatory transfers recliner <> TTB and recliner <> sink this session using RW with close SUP to CGA provided for balance- significantly increased amount of time required 2/2 decreased step length and slowed cadence +MOD verbal cues for head positioning and safe hand placement. He completed majority of U/LB bathing in seated position with SUP +MAX verbal cues for initiation and to continue task. Stood briefly using grab bar with SUP while his washed peri-areas with MIN verbal cues required to ensure cleanliness. Following shower, he dried self in seated/standing position with increased time and rest breaks required 2/2 fatigue. U/LB dressing completed from seated position in recliner donning [place] shirt with set-up A and pants CGA provided when pulling pants to waist. Stood at sink to complete oral care and groom hair with close SUP, no LOB noted however seated rest break provided 2/2 fatigue. Pt quickly falling asleep once seated in recliner. Pt's wife and sister entering room at end of session with education provided on Pt's current  functional status. Pt was left resting in recliner with call bell in reach, chair alarm on, and all needs met.    PM Session:  Pt received deeply sleeping in recliner. Increased time required to initiate participation in therapy session. Pt reporting 0/10 pain- OT offering intermittent rest breaks, repositioning, and therapeutic support to optimize participation in therapy session. Pt's brief noted to be soiled with Pt unaware. Pt receptive to using restroom to adhere to timed toileting schedule. Significantly increased amount of time required to initiate throughout session with extremely slowed processing speed noted. He completed functional mobility to bathroom using RW with CGA +increased time and MOD verbal cues to continue activity, decreased step height and slowed cadence. Completed clothing management in standing CGA and peri-care seated with SUP, no void or BM at this time. Pt ambulated to sink with increased time CGA and maintained standing balance with SUP while washing hands, brushing teeth, and grooming hair +MAX multimodal cues for initiation and continuation of ADLs. Pt extremely fatigue and requesting to return to bed. Completed functional mobility to [name] with slowed pace and MIN A for balance 2/2 fatigue. He doffed shoes in standing, despite OT providing direct instruction to sit to increase safety. [name] > supine SUP +increased time and verbal cues for initiation. Pt quickly falling asleep once in bed. Pt was left resting in bed with call bell in reach, bed alarm on, and all needs met.    Therapy Documentation Precautions:  Precautions Precautions: Fall Recall of Precautions/Restrictions: Impaired Precaution/Restrictions Comments: R weakness, IV abx through 12/3, urinary incontinence Restrictions Weight Bearing Restrictions Per Provider Order: No   Therapy/Group: Individual Therapy  Katheryn SHAUNNA Mines 11/08/2024, 7:32 AM

## 2024-11-09 DIAGNOSIS — B004 Herpesviral encephalitis: Secondary | ICD-10-CM | POA: Diagnosis not present

## 2024-11-09 DIAGNOSIS — R269 Unspecified abnormalities of gait and mobility: Secondary | ICD-10-CM | POA: Diagnosis not present

## 2024-11-09 DIAGNOSIS — R498 Other voice and resonance disorders: Secondary | ICD-10-CM | POA: Diagnosis not present

## 2024-11-09 NOTE — Progress Notes (Signed)
 Physical Therapy Session Note  Patient Details  Name: [name] MRN: [id number] Date of Birth: [birth year]  Today's Date: 11/09/2024 PT Individual Time: 0950-1045 PT Individual Time Calculation (min): 55 min   Short Term Goals: Week 2:  PT Short Term Goal 1 (Week 2): STG = LTG d/t ELOS  Skilled Therapeutic Interventions/Progress Updates: Patient sitting in recliner on entrance to room. Patient alert and agreeable to PT session.   Patient reported no pain during session  Therapeutic Activity: Bed Mobility: Pt performed sit<supine form [name] CGA and increased time to initiate movement.  Transfers: Pt performed sit<>stand transfers throughout session with overall minA and increased time/tactile/VC to initiate movement to stand (CGA to stand pivot in RW with cue to control descent required). Pt required increased time in day room to initiate stand to RW with pt cued to count to 5, or PTA count to 5, and pt verbally expressing initiation to stand but would not, ultimately pt required manual placement of UE on WC to push to start.  - Pt doffed/donned grip sock<personal socks sitting on recliner, and donned personal shoes without assistance (slight increase in time to perform).   Gait Training:  Pt ambulated around nsg/day room loop twice (roughly 320') using RW with overall close supervision/CGA. Pt required VC to look forward ahead vs down at ground with education provided to avoid anterior LOB. Pt cued to decrease cadence on occasion with L LE dragging on floor a few times (CGA/light minA to prevent anterior LOB). Pt also cued to avoid stopping to look around when one LE is at heel-strike, and to instead ensure that B feet are planted during midstance. PTA towed WC throughout but pt did not require seated rest break. Pt required increased time to navigate space and VC to increase visual tracking of environment to avoid bumping into stuff on R. Pt ambulated from day room around nsg and back to  room at end of session in same manner.   Patient supine in bed at end of session with brakes locked, bed alarm set, and all needs within reach.      Therapy Documentation Precautions:  Precautions Precautions: Fall Recall of Precautions/Restrictions: Impaired Precaution/Restrictions Comments: R weakness, IV abx through 12/3, urinary incontinence Restrictions Weight Bearing Restrictions Per Provider Order: No  Therapy/Group: Individual Therapy  Idaly Verret PTA 11/09/2024, 12:21 PM

## 2024-11-09 NOTE — Discharge Instructions (Signed)
 Inpatient Rehab Discharge Instructions  [name] Discharge date and time: 11/15/24   Activities/Precautions/ Functional Status: Activity: no lifting, driving, or strenuous exercise till cleared by MD Diet: regular diet Wound Care: none needed   Functional status:  ___ No restrictions     ___ Walk up steps independently _X__ 24/7 supervision/assistance   ___ Walk up steps with assistance ___ Intermittent supervision/assistance  ___ Bathe/dress independently ___ Walk with walker     _X__ Bathe/dress with assistance ___ Walk Independently    ___ Shower independently _X__ Walk with assistance    ___ Shower with assistance _X__ No alcohol     ___ Return to work/school ________   Special Instructions:  COMMUNITY REFERRALS UPON DISCHARGE:    Home Health:   PT     OT      SP                 Agency:[facility]   [phone]   Medical Equipment/Items Ordered: HAS NEEDED EQUIPMENT AT HOME FROM OTHER FAMILY MEMBERS                                                 Agency/Supplier: NA   Per [place] statutes, patients with seizures are not allowed to drive until  they have been seizure-free for six months. Use caution when using heavy equipment or power tools. Avoid working on ladders or at heights. Take showers instead of baths. Ensure the water  temperature is not too high on the home water  heater. Do not go swimming alone. When caring for infants or small children, sit down when holding, feeding, or changing them to minimize risk of injury to the child in the event you have a seizure. Also, Maintain good sleep hygiene. Avoid alcohol.    My questions have been answered and I understand these instructions. I will adhere to these goals and the provided educational materials after my discharge from the hospital.  Patient/Caregiver Signature _______________________________ Date __________  Clinician Signature _______________________________________ Date  __________  Please bring this form and your medication list with you to all your follow-up doctor's appointments.

## 2024-11-09 NOTE — Progress Notes (Signed)
 Patient ID: [name], male   DOB: 18-Apr-1961, 63 y.o.   MRN: [id number]  [facility]. Wife prefers home health due to she is working form home. Wife to come in Tuesday for family training in preparation for discharge 12/11

## 2024-11-09 NOTE — Progress Notes (Signed)
 Physical Therapy Session Note  Patient Details  Name: [name] MRN: [id number] Date of Birth: 10-Sep-1961  Today's Date: 11/09/2024 PT Individual Time: 1350-1415 PT Individual Time Calculation (min): 25 min   Short Term Goals: Week 2:  PT Short Term Goal 1 (Week 2): STG = LTG d/t ELOS  Skilled Therapeutic Interventions/Progress Updates: Pt presented in bed agreeable to therapy. Pt denies pain during session. Session focused on functional mobility and gait. Pt required reorientation to time as pt asked why he at therapy at 2am. Acknowledged it's afternoon to which pt responded well damn. Pt completed bed moblity with minA for truncal support with use of bed features. PTA donned shoes total A for time management. Pt required a significant amout of time to initiate stand despite significant multimodal cues. Pt eventually stood with CGA and ambulated through day room around 4W nsg station. Pt was handed off to OT at end of session with current needs met.      Therapy Documentation Precautions:  Precautions Precautions: Fall Recall of Precautions/Restrictions: Impaired Precaution/Restrictions Comments: R weakness, IV abx through 12/3, urinary incontinence Restrictions Weight Bearing Restrictions Per Provider Order: No General:   Vital Signs:     Therapy/Group: Individual Therapy  [name] 11/09/2024, 3:53 PM

## 2024-11-09 NOTE — Progress Notes (Signed)
 Speech Language Pathology Daily Session Note  Patient Details  Name: [name] MRN: [id number] Date of Birth: 09-19-1961  Today's Date: 11/09/2024 SLP Individual Time: 1102-1200 SLP Individual Time Calculation (min): 58 min  Short Term Goals: Week 2: SLP Short Term Goal 1 (Week 2): STG = LTG due to ELOS  Skilled Therapeutic Interventions: Skilled therapy session focused on cognitive goals. Patient extremely lethargic upon SLP entrance. SLP prompted patient to transfer from bed to [facility] via RW. Patient transferred to ST office w/ total A to increase alertness. Patient oriented to location and general situation this date. Patient utilized calendar with minA to recall date and month. SLP targeted problem solving goals through basic safety scenario picture cards. Patient required modA to recall problem/solutions with 75% accuracy. In additional minutes of the session, patient participated in dance group given minA to sustain attention to task. Patient left in bed with alarm set and call bell in reach. Continue POC  Pain None reported   Therapy/Group: Individual Therapy  [name] M.A., CCC-SLP 11/09/2024, 7:36 AM

## 2024-11-09 NOTE — Plan of Care (Signed)
  Problem: Consults Goal: RH BRAIN INJURY PATIENT EDUCATION Description: Description: See Patient Education module for eduction specifics Outcome: Not Progressing   Problem: RH SKIN INTEGRITY Goal: RH STG MAINTAIN SKIN INTEGRITY WITH ASSISTANCE Description: STG Maintain Skin Integrity With min Assistance. Outcome: Progressing

## 2024-11-09 NOTE — Progress Notes (Signed)
 Occupational Therapy Session Note  Patient Details  Name: [name] MRN: [id number] Date of Birth: 06/17/1961  Today's Date: 11/09/2024 Session 1 OT Individual Time: 9159-9084 OT Individual Time Calculation (min): 35 min  and Today's Date: 11/09/2024 OT Missed Time: 10 Minutes Missed Time Reason: Other (comment) (delay in care)  Session 2 OT Individual Time: 8581-8542 OT Individual Time Calculation (min): 39 min   Short Term Goals: Week 2:  OT Short Term Goal 1 (Week 2): STG=LTG d/t ELOS  Skilled Therapeutic Interventions/Progress Updates:     AM Session:  Pt received deeply sleeping in recliner requiring increased time to initiate participation in session. Pt presenting to be in good spirits receptive to skilled OT session reporting 0/10 pain- OT offering intermittent rest breaks, repositioning, and therapeutic support to optimize participation in therapy session. Pt brief noted to be soiled and Pt unaware, receptive to using restroom. Increased time and MAX multimodal cues required to initiate stand and complete functional mobility to bathroom using RW with CGA provided for safety. Slowed cadence and decreased step length. Increased provided on toilet to allow for BM-continent B&B documented in flowsheets!! Pt able to complete peri-care standing with SUP following with MOD verbal cues to ensure cleanliness. Able to hold anterior portion of brief while OT fastened and adjusted it with SBA, no LOB. Donned clean pants and shirt on toilet with SUP +increased time and MAX multimodal cues to initiate. Pt completed functional mobility back to recliner using RW CGA. Pt was left resting in recliner with call bell in reach, chair alarm on, and all needs met.  Missed 10 min skilled OT treatment d/t delay in care as OT was running late from previous session. Will attempt to make up time as Pt's status and schedule allow.   PM Session:  Pt handed off in hallway from PT to OT. Pt presenting to be in  good spirits receptive to skilled OT session reporting 0/10 pain- OT offering intermittent rest breaks, repositioning, and therapeutic support to optimize participation in therapy session. Pt in process of ambulating back to room, brief noted to be soiled with Pt unaware. Receptive to getting cleaned up and using restroom once educated on incontinent void in brief. Pt completed ~100 ft functional mobility back to room with CGA provided for safety and balance- consistent verbal and tactile cues required to continue ambulation as Pt is extremely easily distracted and attempted to stop and read all signs- slowed cadence, decreased step length, and posterior bias present. Doffed pants with SUP and increased time provided on toilet per Pt request. Continent void in toilet, however no BM at this time. Pt falling sleep on toilet. Increased time required to initiate stand using grab bar and 1-2-3 method. Once in standing, Pt demo'ing sufficient standing balance to release RW and hold front of brief while OT fastened it. Donned pants with SUP +increased time. Functional mobility to sink using RW CGA +increased time and MAX multimodal cues to continue. Stood to wash hands and complete oral care SUP +verbal cues required to initiate ADLs. Pt then returned to his recliner using RW with CGA +increased time for rest breaks. Pt disoriented to time of day with gentle orientation provided- able to recall he is in the hospital. Pt requesting to get in bed 2/2 to fatigue. Stand pivot recliner > [name] CGA +MAX verbal cues for safety and RW positioning. [name] > supine SUP. Pt was left resting in bed with call bell in reach, bed alarm on, and all  needs met.    Therapy Documentation Precautions:  Precautions Precautions: Fall Recall of Precautions/Restrictions: Impaired Precaution/Restrictions Comments: R weakness, IV abx through 12/3, urinary incontinence Restrictions Weight Bearing Restrictions Per Provider Order:  No   Therapy/Group: Individual Therapy  Katheryn SHAUNNA [place] 11/09/2024, 7:31 AM

## 2024-11-09 NOTE — Progress Notes (Signed)
 PROGRESS NOTE   Subjective/Complaints:   No issues overnite  Greets me when entering room Stated he gets up to bathroom on his own (question the veracity of this statement )   ROS: Limited due to cognitive/behavioral    Objective:   No results found. Recent Labs    11/08/24 0522  WBC 6.8  HGB 12.5*  HCT 35.6*  PLT 158    Recent Labs    11/08/24 0522  NA 135  K 3.8  CL 99  CO2 26  GLUCOSE 95  BUN 20  CREATININE 1.29*  CALCIUM  8.5*    Intake/Output Summary (Last 24 hours) at 11/09/2024 0826 Last data filed at 11/09/2024 0700 Gross per 24 hour  Intake 236 ml  Output --  Net 236 ml        Physical Exam: Vital Signs Blood pressure 93/63, pulse 68, temperature 98.1 F (36.7 C), resp. rate 17, height 5' 9 (1.753 m), weight 77.3 kg, SpO2 98%.  General: No acute distress Mood and affect are appropriate Heart: Regular rate and rhythm no rubs murmurs or extra sounds Lungs: Clear to auscultation, breathing unlabored, no rales or wheezes Abdomen: Positive bowel sounds, soft nontender to palpation, nondistended Extremities: No clubbing, cyanosis, or edema Skin: No evidence of breakdown, no evidence of rash   Neurologic:decreased insight into deficits oriented to hospital in [place] but not time , vocal volume improved  , limited insight and awareness.  cranial nerves II through XII intact, motor moving all 4 extremities to gravity and resistance--  Musculoskeletal: No joint swelling noted. No pain with ROM    Assessment/Plan: 1. Functional deficits which require 3+ hours per day of interdisciplinary therapy in a comprehensive inpatient rehab setting. Physiatrist is providing close team supervision and 24 hour management of active medical problems listed below. Physiatrist and rehab team continue to assess barriers to discharge/monitor patient progress toward functional and medical goals  Care  Tool:  Bathing    Body parts bathed by patient: Right arm, Left arm, Chest, Abdomen, Face   Body parts bathed by helper: Front perineal area, Buttocks, Right upper leg, Left upper leg, Right lower leg, Left lower leg     Bathing assist Assist Level: Moderate Assistance - Patient 50 - 74%     Upper Body Dressing/Undressing Upper body dressing   What is the patient wearing?: Hospital gown only    Upper body assist Assist Level: Moderate Assistance - Patient 50 - 74%    Lower Body Dressing/Undressing Lower body dressing      What is the patient wearing?: Pants, Incontinence brief     Lower body assist Assist for lower body dressing: Dependent - Patient 0%     Toileting Toileting    Toileting assist Assist for toileting: Dependent - Patient 0%     Transfers Chair/bed transfer  Transfers assist     Chair/bed transfer assist level: Minimal Assistance - Patient > 75%     Locomotion Ambulation   Ambulation assist      Assist level: Contact Guard/Touching assist Assistive device: Walker-rolling Max distance: 125'   Walk 10 feet activity   Assist     Assist level: Contact Guard/Touching assist  Assistive device: Walker-rolling   Walk 50 feet activity   Assist Walk 50 feet with 2 turns activity did not occur: Safety/medical concerns  Assist level: Contact Guard/Touching assist Assistive device: Walker-rolling    Walk 150 feet activity   Assist Walk 150 feet activity did not occur: Safety/medical concerns         Walk 10 feet on uneven surface  activity   Assist Walk 10 feet on uneven surfaces activity did not occur: Safety/medical concerns         Wheelchair     Assist Is the patient using a wheelchair?: Yes Type of Wheelchair: Manual    Wheelchair assist level: Maximal Assistance - Patient 25 - 49% Max wheelchair distance: 250 ft    Wheelchair 50 feet with 2 turns activity    Assist        Assist Level: Maximal  Assistance - Patient 25 - 49%   Wheelchair 150 feet activity     Assist      Assist Level: Maximal Assistance - Patient 25 - 49%   Blood pressure 93/63, pulse 68, temperature 98.1 F (36.7 C), resp. rate 17, height 5' 9 (1.753 m), weight 77.3 kg, SpO2 98%.  Medical Problem List and Plan: 1. 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  Antithrombotics: -DVT/anticoagulation:  Pharmaceutical: Lovenox              -antiplatelet therapy:  3. Pain Management: tylenol  prn 4. Mood/Behavior/Sleep: LCSW to follow for evaluation and support.             --Melatonin prn for insomnia             -antipsychotic agents: N/A 5. Neuropsych/cognition: This patient is not fully capable of making decisions on his own behalf. 6. Skin/Wound Care: Routine pressure relief  measures.  7. Fluids/Electrolytes/Nutrition: Monitor I/O. Check CMET in am. Monitor renal status closely  -11/30 intake was recorded yesterday and was 75-100% for all meals   Normal prealbumin on 12/1 8.  Herpes encephalitis: Continue IV acyclovir  thru 11/07/24             --IVF for hydration while on ayclovir to prevent renal injury.             --was on decadron  -tapered off  9. Seizures: Continue Keppra  1000 mg bid 10. Pre-renal azotemia: Encourage fluid intake--off IVF f/u BMP Monday   11/29 renal function/labs stable to improved.     Latest Ref Rng & Units 11/08/2024    5:22 AM 11/05/2024    4:32 AM 11/03/2024    2:26 AM  BMP  Glucose  70 - 99 mg/dL 95  95  95   BUN 8 - 23 mg/dL 20  12  14    Creatinine 0.61 - 1.24 mg/dL 8.70  9.13  9.14   Sodium 135 - 145 mmol/L 135  135  136   Potassium 3.5 - 5.1 mmol/L 3.8  3.7  3.6   Chloride 98 - 111 mmol/L 99  102  103   CO2 22 - 32 mmol/L 26  25  24    Calcium  8.9 - 10.3 mg/dL 8.5  8.4  8.4     11. H/o Depression: was on Zoloft  50 mg w/Remeron  7.5 mg daily  12.  Hypocalcemia: Boderline low with ionized Calcium  1.14. Caltrate added.  13.  Steroid-induced hyperglycemia: resolved off steroid     Serum glucose ok at 111  14. GERD: continue PPI  15. Hypophonia  11/27- Hopefully SLP can help with this 16. Constipation  11/28- LBM    11/29 scheduled MiraLAX --increased to BID   -push po intake  10 days A FACE TO FACE EVALUATION WAS PERFORMED  [name] 11/09/2024, 8:26 AM

## 2024-11-09 NOTE — Plan of Care (Signed)
  Problem: Consults Goal: RH BRAIN INJURY PATIENT EDUCATION Description: Description: See Patient Education module for eduction specifics Outcome: Progressing Goal: Nutrition Consult-if indicated Outcome: Progressing   Problem: RH BOWEL ELIMINATION Goal: RH STG MANAGE BOWEL WITH ASSISTANCE Description: STG Manage Bowel with mod I Assistance. Outcome: Progressing Goal: RH STG MANAGE BOWEL W/MEDICATION W/ASSISTANCE Description: STG Manage Bowel with Medication with mod I Assistance. Outcome: Progressing   Problem: RH BLADDER ELIMINATION Goal: RH STG MANAGE BLADDER WITH ASSISTANCE Description: STG Manage Bladder With toileting Assistance Outcome: Progressing   Problem: RH SKIN INTEGRITY Goal: RH STG SKIN FREE OF INFECTION/BREAKDOWN Description: Manage w min assist Outcome: Progressing Goal: RH STG MAINTAIN SKIN INTEGRITY WITH ASSISTANCE Description: STG Maintain Skin Integrity With min Assistance. Outcome: Progressing Goal: RH STG ABLE TO PERFORM INCISION/WOUND CARE W/ASSISTANCE Description: STG Able To Perform Incision/Wound Care With min Assistance. Outcome: Progressing   Problem: RH SAFETY Goal: RH STG ADHERE TO SAFETY PRECAUTIONS W/ASSISTANCE/DEVICE Description: STG Adhere to Safety Precautions With  cues Assistance/Device. Outcome: Progressing   Problem: RH COGNITION-NURSING Goal: RH STG USES MEMORY AIDS/STRATEGIES W/ASSIST TO PROBLEM SOLVE Description: STG Uses Memory Aids/Strategies With cues Assistance to Problem Solve. Outcome: Progressing   Problem: RH KNOWLEDGE DEFICIT BRAIN INJURY Goal: RH STG INCREASE KNOWLEDGE OF SELF CARE AFTER BRAIN INJURY Description: Patient and spouse will be able to manage care at discharge using educational resources independently Outcome: Progressing

## 2024-11-10 DIAGNOSIS — B004 Herpesviral encephalitis: Secondary | ICD-10-CM | POA: Diagnosis not present

## 2024-11-10 MED ORDER — MIRTAZAPINE 15 MG PO TABS
7.5000 mg | ORAL_TABLET | Freq: Every day | ORAL | Status: DC
  Administered 2024-11-10 – 2024-11-12 (×3): 7.5 mg via ORAL
  Filled 2024-11-10 (×3): qty 1

## 2024-11-10 NOTE — Progress Notes (Signed)
 PROGRESS NOTE   Subjective/Complaints:  No events overnight.  Patient sleeping in bed on exam, will wake up to verbal stimulation but is confused initially upon waking with some verbal jargon.  Becomes more appropriate within a few minutes.  Denies any needs, complaints at this time. Vitals stable, blood pressure somewhat soft with some tachycardia this afternoon.    11/10/2024    2:35 PM 11/10/2024    5:53 AM 11/09/2024    7:37 PM  Vitals with BMI  Systolic 107 94 121  Diastolic 83 62 88  Pulse 107 74 69    No results for input(s): GLUCAP in the last 72 hours.   P.o. intakes appropriate   Incontinent of bladder Last BM 12-5, large, continent  ROS: Limited due to cognitive/behavioral    Objective:   No results found. Recent Labs    11/08/24 0522  WBC 6.8  HGB 12.5*  HCT 35.6*  PLT 158    Recent Labs    11/08/24 0522  NA 135  K 3.8  CL 99  CO2 26  GLUCOSE 95  BUN 20  CREATININE 1.29*  CALCIUM  8.5*    Intake/Output Summary (Last 24 hours) at 11/10/2024 1537 Last data filed at 11/10/2024 0700 Gross per 24 hour  Intake 120 ml  Output --  Net 120 ml        Physical Exam: Vital Signs Blood pressure 107/83, pulse (!) 107, temperature 99 F (37.2 C), temperature source Oral, resp. rate 14, height 5' 9 (1.753 m), weight 77.3 kg, SpO2 99%.  General: No acute distress Mood and affect are appropriate Heart: Regular rate and rhythm no rubs murmurs or extra sounds Lungs: Clear to auscultation, breathing unlabored, no rales or wheezes Abdomen: Positive bowel sounds, soft nontender to palpation, nondistended Extremities: No clubbing, cyanosis, or edema Skin: No evidence of breakdown, no evidence of rash   Neurologic:decreased insight into deficits oriented to hospital in [place] but not time ,  vocal volume  ok  limited insight and awareness.   cranial nerves II through XII intact,  motor moving  all 4 extremities to gravity and resistance--  Musculoskeletal: No joint swelling noted. No pain with ROM   Physical exam unchanged from the above on reexamination 11/10/24    Assessment/Plan: 1. Functional deficits which require 3+ hours per day of interdisciplinary therapy in a comprehensive inpatient rehab setting. Physiatrist is providing close team supervision and 24 hour management of active medical problems listed below. Physiatrist and rehab team continue to assess barriers to discharge/monitor patient progress toward functional and medical goals  Care Tool:  Bathing    Body parts bathed by patient: Right arm, Left arm, Chest, Abdomen, Face   Body parts bathed by helper: Front perineal area, Buttocks, Right upper leg, Left upper leg, Right lower leg, Left lower leg     Bathing assist Assist Level: Moderate Assistance - Patient 50 - 74%     Upper Body Dressing/Undressing Upper body dressing   What is the patient wearing?: Hospital gown only    Upper body assist Assist Level: Moderate Assistance - Patient 50 - 74%    Lower Body Dressing/Undressing Lower body dressing  What is the patient wearing?: Pants, Incontinence brief     Lower body assist Assist for lower body dressing: Dependent - Patient 0%     Toileting Toileting    Toileting assist Assist for toileting: Dependent - Patient 0%     Transfers Chair/bed transfer  Transfers assist     Chair/bed transfer assist level: Minimal Assistance - Patient > 75%     Locomotion Ambulation   Ambulation assist      Assist level: Contact Guard/Touching assist Assistive device: Walker-rolling Max distance: 125'   Walk 10 feet activity   Assist     Assist level: Contact Guard/Touching assist Assistive device: Walker-rolling   Walk 50 feet activity   Assist Walk 50 feet with 2 turns activity did not occur: Safety/medical concerns  Assist level: Contact Guard/Touching assist Assistive  device: Walker-rolling    Walk 150 feet activity   Assist Walk 150 feet activity did not occur: Safety/medical concerns         Walk 10 feet on uneven surface  activity   Assist Walk 10 feet on uneven surfaces activity did not occur: Safety/medical concerns         Wheelchair     Assist Is the patient using a wheelchair?: Yes Type of Wheelchair: Manual    Wheelchair assist level: Maximal Assistance - Patient 25 - 49% Max wheelchair distance: 250 ft    Wheelchair 50 feet with 2 turns activity    Assist        Assist Level: Maximal Assistance - Patient 25 - 49%   Wheelchair 150 feet activity     Assist      Assist Level: Maximal Assistance - Patient 25 - 49%   Blood pressure 107/83, pulse (!) 107, temperature 99 F (37.2 C), temperature source Oral, resp. rate 14, height 5' 9 (1.753 m), weight 77.3 kg, SpO2 99%.  Medical Problem List and Plan: 1. 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  Antithrombotics: -DVT/anticoagulation:  Pharmaceutical: Lovenox              -antiplatelet therapy:  3. Pain Management: tylenol  prn 4. Mood/Behavior/Sleep: LCSW to follow for evaluation and support.             --Melatonin prn for insomnia             -antipsychotic agents: N/A   - 12/6: Sleeping well, on mirtazepine 7.5 mg at bedtime.  Lethargic this afternoon; was just started on Ritalin , consider dc mirtazepine?   5. Neuropsych/cognition: This patient is not fully  capable of making decisions on his own behalf. 6. Skin/Wound Care: Routine pressure relief  measures.  7. Fluids/Electrolytes/Nutrition: Monitor I/O. Check CMET in am. Monitor renal status closely  -11/30 intake was recorded yesterday and was 75-100% for all meals   Normal prealbumin on 12/1  8.  Herpes encephalitis: Continue  IV acyclovir  thru 11/07/24             --IVF for hydration while on ayclovir to prevent renal injury.             --was on decadron  -tapered off   9. Seizures: Continue Keppra  1000 mg bid 10. Pre-renal azotemia: Encourage fluid intake--off IVF f/u BMP Monday   11/29 renal function/labs stable to improved.   12/6: Hypotensive, poor fluid intakes, lethargic.  Repeat BMP in a.m., may need more IV fluids this weekend.--12/7 improved, monitor    Latest Ref Rng & Units 11/08/2024    5:22 AM 11/05/2024    4:32 AM 11/03/2024    2:26 AM  BMP  Glucose 70 - 99 mg/dL 95  95  95   BUN 8 - 23 mg/dL 20  12  14    Creatinine 0.61 - 1.24 mg/dL 8.70  9.13  9.14   Sodium 135 - 145 mmol/L 135  135  136   Potassium 3.5 - 5.1 mmol/L 3.8  3.7  3.6   Chloride 98 - 111 mmol/L 99  102  103   CO2 22 - 32 mmol/L 26  25  24    Calcium  8.9 - 10.3 mg/dL 8.5  8.4  8.4     11. H/o Depression: was on Zoloft  50 mg w/Remeron  7.5 mg daily  12.  Hypocalcemia: Boderline low with ionized Calcium  1.14. Caltrate added.  13.  Steroid-induced hyperglycemia: resolved off steroid     Serum glucose ok at 111  14. GERD: continue PPI  15. Hypophonia  11/27- Hopefully SLP can help with this  16. Constipation  11/28- LBM    11/29 scheduled MiraLAX --increased to BID   -push po intake  12.5: large BM 17.  Hypotension.  Soft blood pressures with some mild tachycardia this afternoon.  Poor p.o. fluid intakes,  - Encourage p.o. fluids, TED/binder when out of bed.   11 days A FACE TO FACE EVALUATION WAS PERFORMED  [name] Likes 11/10/2024, 3:37 PM

## 2024-11-10 NOTE — Plan of Care (Signed)
  Problem: Consults Goal: RH BRAIN INJURY PATIENT EDUCATION Description: Description: See Patient Education module for eduction specifics Outcome: Progressing Goal: Nutrition Consult-if indicated Outcome: Progressing   Problem: RH BOWEL ELIMINATION Goal: RH STG MANAGE BOWEL WITH ASSISTANCE Description: STG Manage Bowel with mod I Assistance. Outcome: Progressing Goal: RH STG MANAGE BOWEL W/MEDICATION W/ASSISTANCE Description: STG Manage Bowel with Medication with mod I Assistance. Outcome: Progressing   Problem: RH BLADDER ELIMINATION Goal: RH STG MANAGE BLADDER WITH ASSISTANCE Description: STG Manage Bladder With toileting Assistance Outcome: Progressing   Problem: RH SKIN INTEGRITY Goal: RH STG SKIN FREE OF INFECTION/BREAKDOWN Description: Manage w min assist Outcome: Progressing Goal: RH STG MAINTAIN SKIN INTEGRITY WITH ASSISTANCE Description: STG Maintain Skin Integrity With min Assistance. Outcome: Progressing Goal: RH STG ABLE TO PERFORM INCISION/WOUND CARE W/ASSISTANCE Description: STG Able To Perform Incision/Wound Care With min Assistance. Outcome: Progressing   Problem: RH SAFETY Goal: RH STG ADHERE TO SAFETY PRECAUTIONS W/ASSISTANCE/DEVICE Description: STG Adhere to Safety Precautions With  cues Assistance/Device. Outcome: Progressing   Problem: RH COGNITION-NURSING Goal: RH STG USES MEMORY AIDS/STRATEGIES W/ASSIST TO PROBLEM SOLVE Description: STG Uses Memory Aids/Strategies With cues Assistance to Problem Solve. Outcome: Progressing   Problem: RH KNOWLEDGE DEFICIT BRAIN INJURY Goal: RH STG INCREASE KNOWLEDGE OF SELF CARE AFTER BRAIN INJURY Description: Patient and spouse will be able to manage care at discharge using educational resources independently Outcome: Progressing

## 2024-11-11 LAB — BASIC METABOLIC PANEL WITH GFR
Anion gap: 10 (ref 5–15)
BUN: 19 mg/dL (ref 8–23)
CO2: 27 mmol/L (ref 22–32)
Calcium: 8.5 mg/dL — ABNORMAL LOW (ref 8.9–10.3)
Chloride: 100 mmol/L (ref 98–111)
Creatinine, Ser: 1.2 mg/dL (ref 0.61–1.24)
GFR, Estimated: >60 mL/min (ref >60)
Glucose, Bld: 95 mg/dL (ref 70–99)
Potassium: 3.7 mmol/L (ref 3.5–5.1)
Sodium: 137 mmol/L (ref 135–145)

## 2024-11-11 NOTE — Plan of Care (Signed)
  Problem: Consults Goal: RH BRAIN INJURY PATIENT EDUCATION Description: Description: See Patient Education module for eduction specifics Outcome: Progressing Goal: Nutrition Consult-if indicated Outcome: Progressing   Problem: RH BOWEL ELIMINATION Goal: RH STG MANAGE BOWEL WITH ASSISTANCE Description: STG Manage Bowel with mod I Assistance. Outcome: Progressing Goal: RH STG MANAGE BOWEL W/MEDICATION W/ASSISTANCE Description: STG Manage Bowel with Medication with mod I Assistance. Outcome: Progressing   Problem: RH BLADDER ELIMINATION Goal: RH STG MANAGE BLADDER WITH ASSISTANCE Description: STG Manage Bladder With toileting Assistance Outcome: Progressing   Problem: RH SKIN INTEGRITY Goal: RH STG SKIN FREE OF INFECTION/BREAKDOWN Description: Manage w min assist Outcome: Progressing Goal: RH STG MAINTAIN SKIN INTEGRITY WITH ASSISTANCE Description: STG Maintain Skin Integrity With min Assistance. Outcome: Progressing Goal: RH STG ABLE TO PERFORM INCISION/WOUND CARE W/ASSISTANCE Description: STG Able To Perform Incision/Wound Care With min Assistance. Outcome: Progressing   Problem: RH SAFETY Goal: RH STG ADHERE TO SAFETY PRECAUTIONS W/ASSISTANCE/DEVICE Description: STG Adhere to Safety Precautions With  cues Assistance/Device. Outcome: Progressing   Problem: RH COGNITION-NURSING Goal: RH STG USES MEMORY AIDS/STRATEGIES W/ASSIST TO PROBLEM SOLVE Description: STG Uses Memory Aids/Strategies With cues Assistance to Problem Solve. Outcome: Progressing   Problem: RH KNOWLEDGE DEFICIT BRAIN INJURY Goal: RH STG INCREASE KNOWLEDGE OF SELF CARE AFTER BRAIN INJURY Description: Patient and spouse will be able to manage care at discharge using educational resources independently Outcome: Progressing

## 2024-11-11 NOTE — Plan of Care (Signed)
  Problem: RH BOWEL ELIMINATION Goal: RH STG MANAGE BOWEL WITH ASSISTANCE Description: STG Manage Bowel with mod I Assistance. Outcome: Progressing   Problem: RH SKIN INTEGRITY Goal: RH STG SKIN FREE OF INFECTION/BREAKDOWN Description: Manage w min assist Outcome: Progressing Goal: RH STG MAINTAIN SKIN INTEGRITY WITH ASSISTANCE Description: STG Maintain Skin Integrity With min Assistance. Outcome: Progressing   Problem: RH SAFETY Goal: RH STG ADHERE TO SAFETY PRECAUTIONS W/ASSISTANCE/DEVICE Description: STG Adhere to Safety Precautions With  cues Assistance/Device. Outcome: Progressing

## 2024-11-12 DIAGNOSIS — R498 Other voice and resonance disorders: Secondary | ICD-10-CM | POA: Diagnosis not present

## 2024-11-12 DIAGNOSIS — R269 Unspecified abnormalities of gait and mobility: Secondary | ICD-10-CM | POA: Diagnosis not present

## 2024-11-12 DIAGNOSIS — B004 Herpesviral encephalitis: Secondary | ICD-10-CM | POA: Diagnosis not present

## 2024-11-12 LAB — CBC
HCT: 37.4 % — ABNORMAL LOW (ref 39.0–52.0)
Hemoglobin: 12.9 g/dL — ABNORMAL LOW (ref 13.0–17.0)
MCH: 34.5 pg — ABNORMAL HIGH (ref 26.0–34.0)
MCHC: 34.5 g/dL (ref 30.0–36.0)
MCV: 100 fL (ref 80.0–100.0)
Platelets: 187 K/uL (ref 150–400)
RBC: 3.74 MIL/uL — ABNORMAL LOW (ref 4.22–5.81)
RDW: 17.1 % — ABNORMAL HIGH (ref 11.5–15.5)
WBC: 6.3 K/uL (ref 4.0–10.5)
nRBC: 0 % (ref 0.0–0.2)

## 2024-11-12 LAB — BASIC METABOLIC PANEL WITH GFR
Anion gap: 12 (ref 5–15)
BUN: 23 mg/dL (ref 8–23)
CO2: 28 mmol/L (ref 22–32)
Calcium: 8.8 mg/dL — ABNORMAL LOW (ref 8.9–10.3)
Chloride: 100 mmol/L (ref 98–111)
Creatinine, Ser: 1.16 mg/dL (ref 0.61–1.24)
GFR, Estimated: >60 mL/min (ref >60)
Glucose, Bld: 101 mg/dL — ABNORMAL HIGH (ref 70–99)
Potassium: 3.7 mmol/L (ref 3.5–5.1)
Sodium: 140 mmol/L (ref 135–145)

## 2024-11-12 NOTE — Plan of Care (Signed)
  Problem: RH BLADDER ELIMINATION Goal: RH STG MANAGE BLADDER WITH ASSISTANCE Description: STG Manage Bladder With toileting Assistance Outcome: Progressing   Problem: RH SKIN INTEGRITY Goal: RH STG SKIN FREE OF INFECTION/BREAKDOWN Description: Manage w min assist Outcome: Progressing Goal: RH STG MAINTAIN SKIN INTEGRITY WITH ASSISTANCE Description: STG Maintain Skin Integrity With min Assistance. Outcome: Progressing

## 2024-11-12 NOTE — Progress Notes (Signed)
 Occupational Therapy Session Note  Patient Details  Name: [name] MRN: [id number] Date of Birth: 05/02/1961  Today's Date: 11/12/2024 OT Individual Time: 9150-9054 OT Individual Time Calculation (min): 56 min    Short Term Goals: Week 2:  OT Short Term Goal 1 (Week 2): STG=LTG d/t ELOS  Skilled Therapeutic Interventions/Progress Updates:     Pt received deeply sleeping in bed waking upon OT arrival. Pt presenting to be in good spirits receptive to skilled OT session reporting 0/10 pain- OT offering intermittent rest breaks, repositioning, and therapeutic support to optimize participation in therapy session. Focused this session on ADL retraining with focus on functional cognition. Pt with extremely slowed initiation throughout session requiring MAX multimodal cues to sit [name], to initiate functional mobility, to initiate ADLs, etc. Pt easily distracted by external stimuli of sounds and signs requiring MAX verbal cues for redirection to task. He completed ambulatory transfers and functional mobility throughout session using RW with CGA to intermittent MIN A for balance d/t posterior bias with decreased step length/height and slowed cadence. Pt brief soiled upon OT arrival and Pt without void/BM this session. He completed majority of U/LB bathing while seated on TTB and stood briefly to wash buttocks while holding grab bars CGA. Pt with appropriate sequencing during bathing, decreased sustained attention. Increased time required for rest break following shower 2/2 to fatigue. Able to dry self with SUP overall in seated position. Following shower, U/LB dressing completed seated in recliner donning [place] shirt with SUP and pants donned with CGA provided when standing to bring pants to waist d/t posterior bias. Pt disoriented at beginning of session, however more oriented at end of session A&O x3 following gentle education and reorientation. Pt was left resting in recliner with call bell in reach, chair  alarm on, and all needs met.    Therapy Documentation Precautions:  Precautions Precautions: Fall Recall of Precautions/Restrictions: Impaired Precaution/Restrictions Comments: R weakness, IV abx through 12/3, urinary incontinence Restrictions Weight Bearing Restrictions Per Provider Order: No   Therapy/Group: Individual Therapy  [name] 11/12/2024, 7:28 AM

## 2024-11-12 NOTE — Plan of Care (Signed)
  Problem: RH BOWEL ELIMINATION Goal: RH STG MANAGE BOWEL WITH ASSISTANCE Description: STG Manage Bowel with mod I Assistance. Outcome: Progressing Goal: RH STG MANAGE BOWEL W/MEDICATION W/ASSISTANCE Description: STG Manage Bowel with Medication with mod I Assistance. Outcome: Progressing   Problem: RH BLADDER ELIMINATION Goal: RH STG MANAGE BLADDER WITH ASSISTANCE Description: STG Manage Bladder With toileting Assistance Outcome: Progressing   Problem: RH SKIN INTEGRITY Goal: RH STG SKIN FREE OF INFECTION/BREAKDOWN Description: Manage w min assist Outcome: Progressing Goal: RH STG MAINTAIN SKIN INTEGRITY WITH ASSISTANCE Description: STG Maintain Skin Integrity With min Assistance. Outcome: Progressing   Problem: RH SAFETY Goal: RH STG ADHERE TO SAFETY PRECAUTIONS W/ASSISTANCE/DEVICE Description: STG Adhere to Safety Precautions With  cues Assistance/Device. Outcome: Progressing   Problem: RH COGNITION-NURSING Goal: RH STG USES MEMORY AIDS/STRATEGIES W/ASSIST TO PROBLEM SOLVE Description: STG Uses Memory Aids/Strategies With cues Assistance to Problem Solve. Outcome: Progressing

## 2024-11-12 NOTE — Progress Notes (Signed)
 PROGRESS NOTE   Subjective/Complaints:  No issues overnite , pt oriented to person and place but not time even with cueing for month     11/12/2024    3:36 AM 11/11/2024    7:56 PM 11/11/2024    2:06 PM  Vitals with BMI  Systolic 106 119 894  Diastolic 73 82 79  Pulse 77 101     No results for input(s): GLUCAP in the last 72 hours.     ROS: Limited due to cognitive/behavioral    Objective:   No results found. Recent Labs    11/12/24 0516  WBC 6.3  HGB 12.9*  HCT 37.4*  PLT 187    Recent Labs    11/11/24 0525 11/12/24 0516  NA 137 140  K 3.7 3.7  CL 100 100  CO2 27 28  GLUCOSE 95 101*  BUN 19 23  CREATININE 1.20 1.16  CALCIUM  8.5* 8.8*    Intake/Output Summary (Last 24 hours) at 11/12/2024 0744 Last data filed at 11/11/2024 2050 Gross per 24 hour  Intake 770 ml  Output --  Net 770 ml        Physical Exam: Vital Signs Blood pressure 106/73, pulse 77, temperature 97.7 F (36.5 C), temperature source Oral, resp. rate 18, height 5' 9 (1.753 m), weight 77.2 kg, SpO2 95%.  General: No acute distress Mood and affect are appropriate Heart: Regular rate and rhythm no rubs murmurs or extra sounds Lungs: Clear to auscultation, breathing unlabored, no rales or wheezes Abdomen: Positive bowel sounds, soft nontender to palpation, nondistended Extremities: No clubbing, cyanosis, or edema Skin: No evidence of breakdown, no evidence of rash   Neurologic:decreased insight into deficits oriented to hospital in [place] but not time ,  vocal volume  ok  limited insight and awareness.   cranial nerves II through XII intact,  motor 5/5 in BUE and BLE   Musculoskeletal: No joint swelling noted. No pain with ROM   Physical exam unchanged from the above on reexamination 11/12/24    Assessment/Plan: 1. Functional deficits which require 3+ hours per day of interdisciplinary therapy in a comprehensive  inpatient rehab setting. Physiatrist is providing close team supervision and 24 hour management of active medical problems listed below. Physiatrist and rehab team continue to assess barriers to discharge/monitor patient progress toward functional and medical goals  Care Tool:  Bathing    Body parts bathed by patient: Right arm, Left arm, Chest, Abdomen, Face   Body parts bathed by helper: Front perineal area, Buttocks, Right upper leg, Left upper leg, Right lower leg, Left lower leg     Bathing assist Assist Level: Moderate Assistance - Patient 50 - 74%     Upper Body Dressing/Undressing Upper body dressing   What is the patient wearing?: Hospital gown only    Upper body assist Assist Level: Moderate Assistance - Patient 50 - 74%    Lower Body Dressing/Undressing Lower body dressing      What is the patient wearing?: Pants, Incontinence brief     Lower body assist Assist for lower body dressing: Dependent - Patient 0%     Toileting Toileting    Toileting assist Assist for  toileting: Dependent - Patient 0%     Transfers Chair/bed transfer  Transfers assist     Chair/bed transfer assist level: Minimal Assistance - Patient > 75%     Locomotion Ambulation   Ambulation assist      Assist level: Contact Guard/Touching assist Assistive device: Walker-rolling Max distance: 125'   Walk 10 feet activity   Assist     Assist level: Contact Guard/Touching assist Assistive device: Walker-rolling   Walk 50 feet activity   Assist Walk 50 feet with 2 turns activity did not occur: Safety/medical concerns  Assist level: Contact Guard/Touching assist Assistive device: Walker-rolling    Walk 150 feet activity   Assist Walk 150 feet activity did not occur: Safety/medical concerns         Walk 10 feet on uneven surface  activity   Assist Walk 10 feet on uneven surfaces activity did not occur: Safety/medical concerns          Wheelchair     Assist Is the patient using a wheelchair?: Yes Type of Wheelchair: Manual    Wheelchair assist level: Maximal Assistance - Patient 25 - 49% Max wheelchair distance: 250 ft    Wheelchair 50 feet with 2 turns activity    Assist        Assist Level: Maximal Assistance - Patient 25 - 49%   Wheelchair 150 feet activity     Assist      Assist Level: Maximal Assistance - Patient 25 - 49%   Blood pressure 106/73, pulse 77, temperature 97.7 F (36.5 C), temperature source Oral, resp. rate 18, height 5' 9 (1.753 m), weight 77.2 kg, SpO2 95%.  Medical Problem List and Plan: 1. 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  Antithrombotics: -DVT/anticoagulation:  Pharmaceutical: Lovenox              -antiplatelet therapy:  3. Pain Management: tylenol  prn 4. Mood/Behavior/Sleep: LCSW to follow for evaluation and support.             --Melatonin prn for insomnia             -antipsychotic agents: N/A  Intermittent lethargy on RItalin  for hospital use but will d/c prior to discharge date   5. Neuropsych/cognition: This patient is not fully capable of making decisions on his own behalf. 6. Skin/Wound Care: Routine pressure relief  measures.  7. Fluids/Electrolytes/Nutrition: Monitor I/O. Check CMET in am. Monitor renal status closely  -11/30 intake was recorded yesterday and was 75-100% for all meals   Normal prealbumin on 12/1  8.  Herpes encephalitis: Continue IV  acyclovir  thru 11/07/24             --IVF for hydration while on ayclovir to prevent renal injury.             --was on decadron  -tapered off   9. Seizures:  Continue Keppra  1000 mg bid 10. Pre-renal azotemia: Encourage fluid intake--off IVF f/u BMP Monday   11/29 renal function/labs stable to improved.   12/6: Hypotensive, poor fluid intakes, lethargic.  Repeat BMP in a.m., may need more IV fluids this weekend.--12/7 improved, monitor    Latest Ref Rng & Units 11/12/2024    5:16 AM 11/11/2024    5:25 AM 11/08/2024    5:22 AM  BMP  Glucose 70 - 99 mg/dL 898  95  95   BUN 8 - 23 mg/dL 23  19  20    Creatinine 0.61 - 1.24 mg/dL 8.83  8.79  8.70   Sodium 135 - 145 mmol/L 140  137  135   Potassium 3.5 - 5.1 mmol/L 3.7  3.7  3.8   Chloride 98 - 111 mmol/L 100  100  99   CO2 22 - 32 mmol/L 28  27  26    Calcium  8.9 - 10.3 mg/dL 8.8  8.5  8.5     11. H/o Depression: was on Zoloft  50 mg w/Remeron  7.5 mg daily  12.  Hypocalcemia: Boderline low with ionized Calcium  1.14. Caltrate added.  13.  Steroid-induced hyperglycemia: resolved off steroid     Serum glucose ok at 111  14. GERD: continue PPI  15. Hypophonia  11/27- Hopefully SLP can help with this  16. Constipation  11/28- LBM    11/29 scheduled MiraLAX --increased to BID   -push po intake  12.5: large BM 17.  Hypotension.  Soft blood pressures with some mild tachycardia this afternoon.  Poor p.o. fluid intakes,  - Encourage p.o. fluids, TED/binder when out of bed.   13 days A FACE TO FACE EVALUATION WAS PERFORMED  [name] 11/12/2024, 7:44 AM

## 2024-11-12 NOTE — Progress Notes (Signed)
 Physical Therapy Session Note  Patient Details  Name: [name] MRN: [id number] Date of Birth: 03-Feb-1961  Today's Date: 11/12/2024 PT Individual Time: 1348-1501 PT Individual Time Calculation (min): 73 min   Short Term Goals: Week 1:  PT Short Term Goal 1 (Week 1): Pt will perform bed mobility with overall SBA and improved initiation with no more than 2 vc. PT Short Term Goal 1 - Progress (Week 1): Met PT Short Term Goal 2 (Week 1): Pt will perform sit<>stand with overall SBA and improved initiation with no more than 2 vc. PT Short Term Goal 2 - Progress (Week 1): Met PT Short Term Goal 3 (Week 1): Pt will performstand pivot transfers with consistent CGA  and using RW with reduced vc for positioning. PT Short Term Goal 3 - Progress (Week 1): Met PT Short Term Goal 4 (Week 1): Pt will ambulate at least 75 ft using RW with consistent CGA. PT Short Term Goal 4 - Progress (Week 1): Met PT Short Term Goal 5 (Week 1): Pt will initiate step/ stair training. PT Short Term Goal 5 - Progress (Week 1): Met Week 2:  PT Short Term Goal 1 (Week 2): STG = LTG d/t ELOS  Skilled Therapeutic Interventions/Progress Updates:  Patient seated upright in recliner with BLE elevated on entrance to room. Patient alert and agreeable to PT session.   Patient with no pain complaint at start of session. Is mildly slow to respond.   Discussed recent therapy and CLOF with pt who relates that he does not know. Asked re: lunch and pt uses incorrect words to relate what he ate. Is however, able to relate correct day of the week.   Attempted to assist pt with ambulation to day room. Instructed pt to change socks and don shoes. Does not move to initiate. Instead turns gaze to TV. TV turned off for improved focus to task however he is now confused by fact that TV went dark. Provided pt with new socks and doffed grip socks with MaxA. Moved new socks to pt's hands and he dons only using R hand despite recommendation to  use both hands.  Placed slip on shoes near feet at floor but pt does not move to don and instead closes eyes. Despite max vc/ tc to initiate, requires MaxA to don. Is already dressed in personal clothes. Provided with continuous multimodal cues to  stand up and ambulate to day room for therapy session. Does not initiate despite continuous cues for scooting forward or leaning forward. After of attempting and providing pt with education re: decreased initiation and time spent not performing since initial request for movement. Finally requires MaxA and max cues for expected movement to lean forward and then countdown to initiate forward lean into rise to stand with hands placed on armrests. Noted smell of urine and so instructed pt to ambulate to bathroom.   Upon rise to stand, pt then provided with vc for step sequence requiring vc to initiate step forward with each foot for increased cadence.   Upon reaching bathroom, brief full of urine and BM smear. While on toilet, pt has continent BM. Requires MaxA for pericare and does not initiate on own.   Ambulation back to bed with MinA and continuous vc for step progression while using RW. Discards RW to foot of bed and then turns to sit closer to [facility] with vc to sit closer to bedrail. Requires consistent vc to initiate return to supine. No move to reach  for covers and provided. Placed ice water  close to pt and encouraged consumption.   Patient supine in bed at end of session with brakes locked, bed alarm set, and all needs within reach.  RN and care manager informed re: pt's regression and return to near baseline status since last seen Wed 12/3 by this therapist - same day as completion of antiviral meds. Pt had been able to speak more clearly and louder, pt had been able to initiate movement with minimal cues. But now is back to requiring MaxA to initiate any movement.   Therapy Documentation Precautions:  Precautions Precautions: Fall Recall of  Precautions/Restrictions: Impaired Precaution/Restrictions Comments: R weakness, IV abx through 12/3, urinary incontinence Restrictions Weight Bearing Restrictions Per Provider Order: No  Pain:  No pain indicated this session.   Therapy/Group: Individual Therapy  [name] DELENA Milliner PT, DPT, CSRS 11/12/2024, 5:45 PM

## 2024-11-12 NOTE — Progress Notes (Signed)
 Patient ID: [name], male   DOB: 05-07-1961, 63 y.o.   MRN: [id number] Wife called needed to change family eduction to Wednesday from 1;00-4:00 instead of tomorrow. Will let team know.

## 2024-11-12 NOTE — Progress Notes (Signed)
 PICC removed per protocol per MD order. Manual pressure applied for 5 mins. Vaseline gauze, gauze, and Tegaderm applied over insertion site. No bleeding or swelling noted. Instructed patient to remain in bed for thirty mins. Educated patient about S/S of infection and when to call MD; no heavy lifting or pressure on left side for 24 hours; keep dressing dry and intact for 24 hours. Pt verbalized comprehension.

## 2024-11-12 NOTE — Progress Notes (Signed)
 Speech Language Pathology Daily Session Note  Patient Details  Name: [name] MRN: [id number] Date of Birth: 1961/04/07  Today's Date: 11/12/2024 SLP Individual Time: 1102-1200 SLP Individual Time Calculation (min): 58 min  Short Term Goals: Week 2: SLP Short Term Goal 1 (Week 2): STG = LTG due to ELOS  Skilled Therapeutic Interventions:  Patient was seen in am to address cognitive re- training. Pt was alert and seated upright in recliner upon SLP arrival. Pt not initially oriented to time, place, or situation. Given external aid, he identified temporal and spatial concepts with min A. He required total A for recall of situation. Pt becoming increasingly lethargic during session, requiring frequent re- alerting cues with alertness remaining for 30 second intervals. Pt ultimately able to maintain alertness without cues for remainder of session. SLP challenging pt in recall of previous therapy sessions and upcoming session. He was initially not oriented to participating in OT session earlier in am warranting total A to recall shower and dressing. He recalled upcoming PT session after review and a distracted delay ~7 minutes with min A. SLP challenging pt in abstract reasoning category exclusion task with max A needed for accuracy. Suspect reduced attention and initiation contributing to severity of deficits. At conclusion of session, pt was left upright in recliner with call button within reach. SLP to continue POC.  Pain Pain Assessment Pain Scale: 0-10 Pain Score: 0-No pain  Therapy/Group: Individual Therapy  Joane GORMAN Fuss 11/12/2024, 11:19 AM

## 2024-11-13 LAB — URINALYSIS, W/ REFLEX TO CULTURE (INFECTION SUSPECTED)
Bilirubin Urine: NEGATIVE
Glucose, UA: NEGATIVE mg/dL
Hgb urine dipstick: NEGATIVE
Ketones, ur: NEGATIVE mg/dL
Leukocytes,Ua: NEGATIVE
Nitrite: NEGATIVE
Protein, ur: NEGATIVE mg/dL
Specific Gravity, Urine: 1.015 (ref 1.005–1.030)
pH: 6 (ref 5.0–8.0)

## 2024-11-13 MED ORDER — METHYLPHENIDATE HCL 5 MG PO TABS
10.0000 mg | ORAL_TABLET | Freq: Two times a day (BID) | ORAL | Status: DC
  Administered 2024-11-13 – 2024-11-14 (×3): 10 mg via ORAL
  Filled 2024-11-13 (×3): qty 2

## 2024-11-13 NOTE — Plan of Care (Signed)
  Problem: RH BOWEL ELIMINATION Goal: RH STG MANAGE BOWEL WITH ASSISTANCE Description: STG Manage Bowel with mod I Assistance. Outcome: Progressing Goal: RH STG MANAGE BOWEL W/MEDICATION W/ASSISTANCE Description: STG Manage Bowel with Medication with mod I Assistance. Outcome: Progressing   Problem: RH BLADDER ELIMINATION Goal: RH STG MANAGE BLADDER WITH ASSISTANCE Description: STG Manage Bladder With toileting Assistance Outcome: Progressing   Problem: RH SKIN INTEGRITY Goal: RH STG SKIN FREE OF INFECTION/BREAKDOWN Description: Manage w min assist Outcome: Progressing Goal: RH STG MAINTAIN SKIN INTEGRITY WITH ASSISTANCE Description: STG Maintain Skin Integrity With min Assistance. Outcome: Progressing   Problem: RH SAFETY Goal: RH STG ADHERE TO SAFETY PRECAUTIONS W/ASSISTANCE/DEVICE Description: STG Adhere to Safety Precautions With  cues Assistance/Device. Outcome: Progressing

## 2024-11-13 NOTE — Progress Notes (Signed)
 Speech Language Pathology Daily Session Note  Patient Details  Name: [name] MRN: [id number] Date of Birth: 1960-12-10  Today's Date: 11/13/2024 SLP Individual Time: 1000-1101 SLP Individual Time Calculation (min): 61 min  Short Term Goals: Week 2: SLP Short Term Goal 1 (Week 2): STG = LTG due to ELOS  Skilled Therapeutic Interventions:  Patient was seen in am to address cognitive re- training. Pt was alert and seated upright in recliner upon SLP arrival. Pt much more alert throughout session and agreeable for participation. SLP instructing pt in completion of oral hygiene. Pt completed set up and completion of oral hygiene with adequate attention and then cessation of task. Pt transitioned to speech office for completion of session. Pt recalled participation in OT session with max A. He was oriented to month with min A though required external aid and min a to identify year, date, and day of week. In other minutes of session, SLP challenged pt in sequencing months of the year where he initially required max A with cues able to be faded to sup a by task completion. Pt subsequently challenged in matching holidays to months where he required total A. Pt also challenged in a mild complexity matching game with goal of matching a common object to a complimentary object. He completed task with overall mod A. At conclusion of session pt was returned to his room. Direct handoff to nurse for care. SLP to continue POC.   Pain Pain Assessment Pain Scale: 0-10 Pain Score: 0-No pain  Therapy/Group: Individual Therapy  Joane GORMAN Fuss 11/13/2024, 10:03 AM

## 2024-11-13 NOTE — Progress Notes (Signed)
 PROGRESS NOTE   Subjective/Complaints:  Increased processing time noted by PT over the last couple days, vitals and labs reviewed normal, meds reviewed on Mirtazepine and levetiracetam  as potential sedating agents.    11/13/2024    3:51 AM 11/12/2024    8:04 PM 11/12/2024    1:46 PM  Vitals with BMI  Systolic 102 103 885  Diastolic 71 74 92  Pulse 70 86 84    No results for input(s): GLUCAP in the last 72 hours.     ROS: Limited due to cognitive/behavioral    Objective:   No results found. Recent Labs    11/12/24 0516  WBC 6.3  HGB 12.9*  HCT 37.4*  PLT 187    Recent Labs    11/11/24 0525 11/12/24 0516  NA 137 140  K 3.7 3.7  CL 100 100  CO2 27 28  GLUCOSE 95 101*  BUN 19 23  CREATININE 1.20 1.16  CALCIUM  8.5* 8.8*    Intake/Output Summary (Last 24 hours) at 11/13/2024 0750 Last data filed at 11/12/2024 1815 Gross per 24 hour  Intake 720 ml  Output --  Net 720 ml        Physical Exam: Vital Signs Blood pressure 102/71, pulse 70, temperature 98.2 F (36.8 C), temperature source Oral, resp. rate 16, height 5' 9 (1.753 m), weight 77.2 kg, SpO2 97%.  General: No acute distress Mood and affect are appropriate Heart: Regular rate and rhythm no rubs murmurs or extra sounds Lungs: Clear to auscultation, breathing unlabored, no rales or wheezes Abdomen: Positive bowel sounds, soft nontender to palpation, nondistended Extremities: No clubbing, cyanosis, or edema Skin: No evidence of breakdown, no evidence of rash   Neurologic:decreased insight into deficits oriented to hospital in [place] but not time ,  vocal volume  ok  limited insight and awareness.   cranial nerves II through XII intact,  motor 5/5 in BUE and BLE   Musculoskeletal: No joint swelling noted. No pain with ROM   Physical exam unchanged from the above on reexamination 11/13/24    Assessment/Plan: 1. Functional deficits  which require 3+ hours per day of interdisciplinary therapy in a comprehensive inpatient rehab setting. Physiatrist is providing close team supervision and 24 hour management of active medical problems listed below. Physiatrist and rehab team continue to assess barriers to discharge/monitor patient progress toward functional and medical goals  Care Tool:  Bathing    Body parts bathed by patient: Right arm, Left arm, Chest, Abdomen, Face   Body parts bathed by helper: Front perineal area, Buttocks, Right upper leg, Left upper leg, Right lower leg, Left lower leg     Bathing assist Assist Level: Moderate Assistance - Patient 50 - 74%     Upper Body Dressing/Undressing Upper body dressing   What is the patient wearing?: Hospital gown only    Upper body assist Assist Level: Moderate Assistance - Patient 50 - 74%    Lower Body Dressing/Undressing Lower body dressing      What is the patient wearing?: Pants, Incontinence brief     Lower body assist Assist for lower body dressing: Dependent - Patient 0%     Toileting Toileting  Toileting assist Assist for toileting: Maximal Assistance - Patient 25 - 49%     Transfers Chair/bed transfer  Transfers assist     Chair/bed transfer assist level: Minimal Assistance - Patient > 75%     Locomotion Ambulation   Ambulation assist      Assist level: Contact Guard/Touching assist Assistive device: Walker-rolling Max distance: 125'   Walk 10 feet activity   Assist     Assist level: Contact Guard/Touching assist Assistive device: Walker-rolling   Walk 50 feet activity   Assist Walk 50 feet with 2 turns activity did not occur: Safety/medical concerns  Assist level: Contact Guard/Touching assist Assistive device: Walker-rolling    Walk 150 feet activity   Assist Walk 150 feet activity did not occur: Safety/medical concerns         Walk 10 feet on uneven surface  activity   Assist Walk 10 feet on  uneven surfaces activity did not occur: Safety/medical concerns         Wheelchair     Assist Is the patient using a wheelchair?: Yes Type of Wheelchair: Manual    Wheelchair assist level: Maximal Assistance - Patient 25 - 49% Max wheelchair distance: 250 ft    Wheelchair 50 feet with 2 turns activity    Assist        Assist Level: Maximal Assistance - Patient 25 - 49%   Wheelchair 150 feet activity     Assist      Assist Level: Maximal Assistance - Patient 25 - 49%   Blood pressure 102/71, pulse 70, temperature 98.2 F (36.8 C), temperature source Oral, resp. rate 16, height 5' 9 (1.753 m), weight 77.2 kg, SpO2 97%.  Medical Problem List and Plan: 1. 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, inferior frontal, and left temp lobe Cognitive deficits and balance issues, possibly with mild RUE weakness but will need to observe functionally              -patient may shower, if picc covered             -ELOS/Goals: 11/15/24  PT/OT/SLP mod I to sup            -Continue CIR therapies including PT, OT, and SLP   Very poor initiation and LOA will trial ritalin  -Still with intermittent lethargy , but mainly processing delay with physical activity ?apraxia , orientation and conversationally at recent baseline , increased ritalin  dose today   PICC line out, afebrile no signs of line sepsis  Will also do cath urinalysis with reflex to culture , discussed with pt    2.  Antithrombotics: -DVT/anticoagulation:  Pharmaceutical: Lovenox              -antiplatelet therapy:  3. Pain Management: tylenol  prn 4. Mood/Behavior/Sleep: LCSW to follow for evaluation and support.             --Melatonin prn for insomnia             -antipsychotic agents: N/A  Intermittent lethargy on RItalin  for hospital use but will d/c prior to discharge date   5. Neuropsych/cognition: This patient is not fully capable of making decisions  on his own behalf. 6. Skin/Wound Care: Routine pressure relief  measures.  7. Fluids/Electrolytes/Nutrition: Monitor I/O. Check CMET in am. Monitor renal status closely  -11/30 intake was recorded yesterday and was 75-100% for all meals   Normal prealbumin on 12/1  8.  Herpes encephalitis: Continue IV acyclovir   thru 11/07/24             --IVF for hydration while on ayclovir to prevent renal injury.             --was on decadron  -tapered off   9. Seizures: Continue Keppra  1000 mg bid, controlled check Levetiracetam  level today to see if this may be contributing to delayed processing - if elevated will drop dose  10. Pre-renal azotemia: Encourage fluid intake--off IVF f/u BMP Monday   11/29 renal function/labs stable to improved.   12/6: Hypotensive, poor fluid intakes, lethargic.  Repeat BMP in a.m., may need more IV fluids this weekend.--12/7 improved, monitor    Latest Ref Rng & Units 11/12/2024    5:16 AM 11/11/2024    5:25 AM 11/08/2024    5:22 AM  BMP  Glucose 70 - 99 mg/dL 898  95  95   BUN 8 - 23 mg/dL 23  19  20    Creatinine 0.61 - 1.24 mg/dL 8.83  8.79  8.70   Sodium 135 - 145 mmol/L 140  137  135   Potassium 3.5 - 5.1 mmol/L 3.7  3.7  3.8   Chloride 98 - 111 mmol/L 100  100  99   CO2 22 - 32 mmol/L 28  27  26    Calcium  8.9 - 10.3 mg/dL 8.8  8.5  8.5     11. H/o Depression: was on Zoloft  50 mg w/Remeron  7.5 mg daily  12.  Hypocalcemia: Boderline low with ionized Calcium  1.14. Caltrate added.  13.  Steroid-induced hyperglycemia: resolved off steroid     Serum glucose ok at 111  14. GERD: continue PPI  15. Hypophonia  11/27- Hopefully SLP can help with this  16. Constipation  11/28- LBM    11/29 scheduled MiraLAX --increased to BID   -push po intake  12.5: large BM 17.  Hypotension.  Soft blood pressures with some mild tachycardia this afternoon.  Poor p.o. fluid intakes,  - Encourage p.o. fluids, TED/binder when out of bed.   14 days A FACE TO FACE EVALUATION WAS  PERFORMED  [name] 11/13/2024, 7:50 AM

## 2024-11-13 NOTE — Progress Notes (Signed)
 Physical Therapy Session Note  Patient Details  Name: [name] MRN: [id number] Date of Birth: 1961-08-24  Today's Date: 11/13/2024 PT Individual Time: 1133-1205 PT Individual Time Calculation (min): 32 min   Short Term Goals: Week 1:  PT Short Term Goal 1 (Week 1): Pt will perform bed mobility with overall SBA and improved initiation with no more than 2 vc. PT Short Term Goal 1 - Progress (Week 1): Met PT Short Term Goal 2 (Week 1): Pt will perform sit<>stand with overall SBA and improved initiation with no more than 2 vc. PT Short Term Goal 2 - Progress (Week 1): Met PT Short Term Goal 3 (Week 1): Pt will performstand pivot transfers with consistent CGA  and using RW with reduced vc for positioning. PT Short Term Goal 3 - Progress (Week 1): Met PT Short Term Goal 4 (Week 1): Pt will ambulate at least 75 ft using RW with consistent CGA. PT Short Term Goal 4 - Progress (Week 1): Met PT Short Term Goal 5 (Week 1): Pt will initiate step/ stair training. PT Short Term Goal 5 - Progress (Week 1): Met Week 2:  PT Short Term Goal 1 (Week 2): STG = LTG d/t ELOS  Skilled Therapeutic Interventions/Progress Updates:      Therapy Documentation Precautions:  Precautions Precautions: Fall Recall of Precautions/Restrictions: Impaired Precaution/Restrictions Comments: R weakness, IV abx through 12/3, urinary incontinence Restrictions Weight Bearing Restrictions Per Provider Order: No General:   Vital Signs:   Pain: Pain Assessment Pain Scale: 0-10 Pain Score: 0-No pain Mobility:   Locomotion :    Trunk/Postural Assessment :    Balance:   Exercises:   Other Treatments:      Therapy/Group: Individual Therapy  [name] DELENA Milliner PT, DPT, CSRS 11/13/2024, 12:54 PM

## 2024-11-13 NOTE — Progress Notes (Signed)
 Occupational Therapy Session Note  Patient Details  Name: [name] MRN: [id number] Date of Birth: 10-01-1961  Today's Date: 11/13/2024 OT Individual Time: 9168-9070 OT Individual Time Calculation (min): 58 min    Short Term Goals: Week 2:  OT Short Term Goal 1 (Week 2): STG=LTG d/t ELOS  Skilled Therapeutic Interventions/Progress Updates:     Pt received sitting up in bed, awake and alert presenting to be in good spirits receptive to skilled OT session reporting 0/10 pain- OT offering intermittent rest breaks, repositioning, and therapeutic support to optimize participation in therapy session.   Pt with improved initiation and sustained attention in quiet environment this session compared to yesterday. He transitioned to [name] following single verbal prompt with SUP +increased time. Initiated donning pants when presented with them and he inquired about wearing shoes when he noticed them in the corner. Able to weave B LEs into pants and stand with CGA to bring them to waist. Crossed B LEs into figure-4 to don socks/shoe with CGA provided for dynamic sitting balance. Decreased activity tolerance present with Pt verbalizing that tired me out following ADLs. Pt required ~15 min to complete bed mobility and ADLs.   Engaged Pt in completing functional mobility to therapy gym using RW to work on endurance training and RW management skills. He was able to initiate functional mobility following single verbal cue and use of song to engage Pt in activity. Pt does become easily distracted by visual stimuli requiring consistent verbal cues to regain attention to activity. Seated rest break provided following.   Pt participated in functional cognition dual tasking pattern replication task in standing position tolerating standing for >10 min with SUP UE support on table top, replicating 4 patterns of up to 10 shapes with 100% accuracy and MIN-MOD verbal cues to attend to activity d/t distracting gym  environment.   Pt participated in dynamic standing balance dual tasking ball tossing activity to work on reactionary balance and trunk control. Pt able to toss ball back and forth with RT with CGA to intermittent MIN A required to correct balance d/t posterior bias. Pt then bounced ball back and forth at same level of assist, seated rest break provided following. Attended well to functional activity once he became engaged.   Pt completed functional mobility back to his room for endurance training with CGA using RW +increased time and MIN verbal cues to attend and continue mobility.  Pt was left resting in recliner with call bell in reach, chair alarm on, and all needs met.    Therapy Documentation Precautions:  Precautions Precautions: Fall Recall of Precautions/Restrictions: Impaired Precaution/Restrictions Comments: R weakness, IV abx through 12/3, urinary incontinence Restrictions Weight Bearing Restrictions Per Provider Order: No  Therapy/Group: Individual Therapy  Katheryn SHAUNNA [place] 11/13/2024, 7:28 AM

## 2024-11-13 NOTE — Progress Notes (Signed)
 Physical Therapy Session Note  Patient Details  Name: [name] MRN: [id number] Date of Birth: [birth year]  Today's Date: 11/13/2024 PT Individual Time: 8581-8484 PT Individual Time Calculation (min): 57 min   Short Term Goals: Week 1:  PT Short Term Goal 1 (Week 1): Pt will perform bed mobility with overall SBA and improved initiation with no more than 2 vc. PT Short Term Goal 1 - Progress (Week 1): Met PT Short Term Goal 2 (Week 1): Pt will perform sit<>stand with overall SBA and improved initiation with no more than 2 vc. PT Short Term Goal 2 - Progress (Week 1): Met PT Short Term Goal 3 (Week 1): Pt will performstand pivot transfers with consistent CGA  and using RW with reduced vc for positioning. PT Short Term Goal 3 - Progress (Week 1): Met PT Short Term Goal 4 (Week 1): Pt will ambulate at least 75 ft using RW with consistent CGA. PT Short Term Goal 4 - Progress (Week 1): Met PT Short Term Goal 5 (Week 1): Pt will initiate step/ stair training. PT Short Term Goal 5 - Progress (Week 1): Met Week 2:  PT Short Term Goal 1 (Week 2): STG = LTG d/t ELOS  Skilled Therapeutic Interventions/Progress Updates:      Therapy Documentation Precautions:  Precautions Precautions: Fall Recall of Precautions/Restrictions: Impaired Precaution/Restrictions Comments: R weakness, IV abx through 12/3, urinary incontinence Restrictions Weight Bearing Restrictions Per Provider Order: No General:   Vital Signs: Therapy Vitals Temp: 98.9 F (37.2 C) Temp Source: Oral Pulse Rate: 95 Resp: 20 BP: 90/70 Patient Position (if appropriate): Sitting Oxygen  Therapy SpO2: 97 % O2 Device: Room Air Pain:   Mobility:   Locomotion :    Trunk/Postural Assessment :    Balance:   Exercises:   Other Treatments:      Therapy/Group: Individual Therapy  [name] [name] PT, DPT, CSRS 11/13/2024, 5:29 PM

## 2024-11-14 ENCOUNTER — Telehealth: Payer: Self-pay | Admitting: *Deleted

## 2024-11-14 ENCOUNTER — Other Ambulatory Visit (HOSPITAL_COMMUNITY): Payer: Self-pay

## 2024-11-14 DIAGNOSIS — R269 Unspecified abnormalities of gait and mobility: Secondary | ICD-10-CM | POA: Diagnosis not present

## 2024-11-14 DIAGNOSIS — R498 Other voice and resonance disorders: Secondary | ICD-10-CM | POA: Diagnosis not present

## 2024-11-14 LAB — LEVETIRACETAM LEVEL: Levetiracetam Lvl: 37.7 ug/mL (ref 10.0–40.0)

## 2024-11-14 MED ORDER — CALCIUM CITRATE 950 (200 CA) MG PO TABS
200.0000 mg | ORAL_TABLET | Freq: Every day | ORAL | 0 refills | Status: AC
  Filled 2024-11-14: qty 30, 30d supply, fill #0

## 2024-11-14 MED ORDER — ACETAMINOPHEN 325 MG PO TABS: 325.0000 mg | ORAL_TABLET | ORAL | Status: AC | PRN

## 2024-11-14 MED ORDER — POLYETHYLENE GLYCOL 3350 17 GM/SCOOP PO POWD
17.0000 g | Freq: Every day | ORAL | 0 refills | Status: AC
  Filled 2024-11-14: qty 476, 28d supply, fill #0

## 2024-11-14 MED ORDER — PANTOPRAZOLE SODIUM 40 MG PO TBEC
40.0000 mg | DELAYED_RELEASE_TABLET | Freq: Every day | ORAL | 0 refills | Status: DC
  Filled 2024-11-14: qty 30, 30d supply, fill #0

## 2024-11-14 MED ORDER — LEVETIRACETAM 1000 MG PO TABS
1000.0000 mg | ORAL_TABLET | Freq: Two times a day (BID) | ORAL | 0 refills | Status: DC
  Filled 2024-11-14: qty 60, 30d supply, fill #0

## 2024-11-14 MED ORDER — SENNA 8.6 MG PO TABS
1.0000 | ORAL_TABLET | Freq: Every day | ORAL | 0 refills | Status: DC
  Filled 2024-11-14: qty 30, 30d supply, fill #0

## 2024-11-14 MED ORDER — ADULT MULTIVITAMIN W/MINERALS CH: 1.0000 | ORAL_TABLET | Freq: Every day | ORAL | Status: AC

## 2024-11-14 NOTE — Telephone Encounter (Signed)
 Molly called from [facility] and there is a problem with the papers you filled out with a differing date on the disability and she has some questions. Can you please give her a call. [phone]

## 2024-11-14 NOTE — Plan of Care (Signed)
  Problem: RH Grooming Goal: LTG Patient will perform grooming w/assist,cues/equip (OT) Description: LTG: Patient will perform grooming with assist, with/without cues using equipment (OT) Outcome: Completed/Met   Problem: RH Bathing Goal: LTG Patient will bathe all body parts with assist levels (OT) Description: LTG: Patient will bathe all body parts with assist levels (OT) Outcome: Completed/Met   Problem: RH Dressing Goal: LTG Patient will perform upper body dressing (OT) Description: LTG Patient will perform upper body dressing with assist, with/without cues (OT). Outcome: Completed/Met Goal: LTG Patient will perform lower body dressing w/assist (OT) Description: LTG: Patient will perform lower body dressing with assist, with/without cues in positioning using equipment (OT) Outcome: Completed/Met   Problem: RH Toileting Goal: LTG Patient will perform toileting task (3/3 steps) with assistance level (OT) Description: LTG: Patient will perform toileting task (3/3 steps) with assistance level (OT)  Outcome: Completed/Met   Problem: RH Toilet Transfers Goal: LTG Patient will perform toilet transfers w/assist (OT) Description: LTG: Patient will perform toilet transfers with assist, with/without cues using equipment (OT) Outcome: Completed/Met   Problem: RH Tub/Shower Transfers Goal: LTG Patient will perform tub/shower transfers w/assist (OT) Description: LTG: Patient will perform tub/shower transfers with assist, with/without cues using equipment (OT) Outcome: Completed/Met

## 2024-11-14 NOTE — Progress Notes (Signed)
 Occupational Therapy Note  Patient Details  Name: KAIKOA MAGRO MRN: [id number] Date of Birth: 31-Dec-1960  Occupational Therapist participated in the interdisciplinary team conference, providing clinical information regarding the patients current status, treatment goals, and weekly focus, including any barriers that need to be addressed. Please see the Inpatient Rehabilitation Team Conference and Plan of Care Update for further details.     Katheryn SHAUNNA Woodson 11/14/2024, 10:46 AM

## 2024-11-14 NOTE — Progress Notes (Signed)
 Inpatient Rehabilitation Discharge Medication Review by a Pharmacist  A complete drug regimen review was completed for this patient to identify any potential clinically significant medication issues.  High Risk Drug Classes Is patient taking? Indication by Medication  Antipsychotic No   Anticoagulant No   Antibiotic No   Opioid No   Antiplatelet No   Hypoglycemics/insulin  No   Vasoactive Medication No   Chemotherapy No   Other Yes Levetiracetam  - seizures Pantoprazole  - reflux Airsupra  prn SOB, wheezing Famotidine  - reflux     Type of Medication Issue Identified Description of Issue Recommendation(s)  Drug Interaction(s) (clinically significant)     Duplicate Therapy     Allergy     No Medication Administration End Date     Incorrect Dose     Additional Drug Therapy Needed     Significant med changes from prior encounter (inform family/care partners about these prior to discharge).    Other       Clinically significant medication issues were identified that warrant physician communication and completion of prescribed/recommended actions by midnight of the next day:  No  Name of provider notified for urgent issues identified:   Provider Method of Notification:     Pharmacist comments: None  Time spent performing this drug regimen review (minutes):  20 minutes  Thank you. Olam Monte, PharmD

## 2024-11-14 NOTE — Progress Notes (Signed)
 PROGRESS NOTE   Subjective/Complaints:  Sleeping but awakens to physical stim Speech delayed but answers questions appropriately     11/14/2024    4:22 AM 11/13/2024    7:30 PM 11/13/2024    2:13 PM  Vitals with BMI  Systolic 104 97 90  Diastolic 74 69 70  Pulse 65 87 95    No results for input(s): GLUCAP in the last 72 hours.     ROS: Limited due to cognitive/behavioral    Objective:   No results found. Recent Labs    11/12/24 0516  WBC 6.3  HGB 12.9*  HCT 37.4*  PLT 187    Recent Labs    11/12/24 0516  NA 140  K 3.7  CL 100  CO2 28  GLUCOSE 101*  BUN 23  CREATININE 1.16  CALCIUM  8.8*    Intake/Output Summary (Last 24 hours) at 11/14/2024 0702 Last data filed at 11/13/2024 1837 Gross per 24 hour  Intake 240 ml  Output --  Net 240 ml        Physical Exam: Vital Signs Blood pressure 104/74, pulse 65, temperature 98.5 F (36.9 C), temperature source Oral, resp. rate 18, height 5' 9 (1.753 m), weight 77.2 kg, SpO2 95%.  General: No acute distress Mood and affect are appropriate Heart: Regular rate and rhythm no rubs murmurs or extra sounds Lungs: Clear to auscultation, breathing unlabored, no rales or wheezes Abdomen: Positive bowel sounds, soft nontender to palpation, nondistended Extremities: No clubbing, cyanosis, or edema Skin: No evidence of breakdown, no evidence of rash   Neurologic:decreased insight into deficits oriented to hospital in [place] , [place] , year but not day and date,  vocal volume  ok  limited insight and awareness.   cranial nerves II through XII intact,  motor 5/5 in BUE and BLE   Musculoskeletal: No joint swelling noted. No pain with ROM   Physical exam unchanged from the above on reexamination 11/14/24    Assessment/Plan: 1. Functional deficits which require 3+ hours per day of interdisciplinary therapy in a comprehensive inpatient rehab  setting. Physiatrist is providing close team supervision and 24 hour management of active medical problems listed below. Physiatrist and rehab team continue to assess barriers to discharge/monitor patient progress toward functional and medical goals  Care Tool:  Bathing    Body parts bathed by patient: Right arm, Left arm, Chest, Abdomen, Face   Body parts bathed by helper: Front perineal area, Buttocks, Right upper leg, Left upper leg, Right lower leg, Left lower leg     Bathing assist Assist Level: Moderate Assistance - Patient 50 - 74%     Upper Body Dressing/Undressing Upper body dressing   What is the patient wearing?: Hospital gown only    Upper body assist Assist Level: Moderate Assistance - Patient 50 - 74%    Lower Body Dressing/Undressing Lower body dressing      What is the patient wearing?: Pants, Incontinence brief     Lower body assist Assist for lower body dressing: Dependent - Patient 0%     Toileting Toileting    Toileting assist Assist for toileting: Minimal Assistance - Patient > 75%  Transfers Chair/bed transfer  Transfers assist     Chair/bed transfer assist level: Minimal Assistance - Patient > 75%     Locomotion Ambulation   Ambulation assist      Assist level: Contact Guard/Touching assist Assistive device: Walker-rolling Max distance: 125'   Walk 10 feet activity   Assist     Assist level: Contact Guard/Touching assist Assistive device: Walker-rolling   Walk 50 feet activity   Assist Walk 50 feet with 2 turns activity did not occur: Safety/medical concerns  Assist level: Contact Guard/Touching assist Assistive device: Walker-rolling    Walk 150 feet activity   Assist Walk 150 feet activity did not occur: Safety/medical concerns         Walk 10 feet on uneven surface  activity   Assist Walk 10 feet on uneven surfaces activity did not occur: Safety/medical concerns          Wheelchair     Assist Is the patient using a wheelchair?: Yes Type of Wheelchair: Manual    Wheelchair assist level: Maximal Assistance - Patient 25 - 49% Max wheelchair distance: 250 ft    Wheelchair 50 feet with 2 turns activity    Assist        Assist Level: Maximal Assistance - Patient 25 - 49%   Wheelchair 150 feet activity     Assist      Assist Level: Maximal Assistance - Patient 25 - 49%   Blood pressure 104/74, pulse 65, temperature 98.5 F (36.9 C), temperature source Oral, resp. rate 18, height 5' 9 (1.753 m), weight 77.2 kg, SpO2 95%.  Medical Problem List and Plan: 1. 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, inferior frontal, and left temp lobe Cognitive deficits and balance issues, possibly with mild RUE weakness but will need to observe functionally              -patient may shower, if picc covered             -ELOS/Goals: 11/15/24  PT/OT/SLP mod I to sup            -Continue CIR therapies including PT, OT, and SLP   Very poor initiation and LOA will trial ritalin  -Still with intermittent lethargy , but mainly processing delay with physical activity ?apraxia , orientation and conversationally at recent baseline , increased ritalin  dose today   PICC line out, afebrile no signs of line sepsis  UA neg Await Levetacetiram level   2.  Antithrombotics: -DVT/anticoagulation:  Pharmaceutical: Lovenox              -antiplatelet therapy:  3. Pain Management: tylenol  prn 4. Mood/Behavior/Sleep: LCSW to follow for evaluation and support.             --Melatonin prn for insomnia             -antipsychotic agents: N/A  Intermittent lethargy on RItalin  for hospital use but will d/c prior to discharge date   5. Neuropsych/cognition: This patient is not fully capable of making decisions on his own behalf. 6. Skin/Wound Care: Routine pressure relief  measures.  7. Fluids/Electrolytes/Nutrition:  Monitor I/O. Check CMET in am. Monitor renal status closely  -11/30 intake was recorded yesterday and was 75-100% for all meals   Normal prealbumin on 12/1  8.  Herpes encephalitis: Continue IV acyclovir  thru 11/07/24             --IVF for hydration while on ayclovir to prevent renal injury.             --  was on decadron  -tapered off   9. Seizures: Continue Keppra  1000 mg bid, controlled check Levetiracetam  level today to see if this may be contributing to delayed processing - if elevated will drop dose  10. Pre-renal azotemia: Encourage fluid intake--off IVF f/u BMP Monday   11/29 renal function/labs stable to improved.   12/6: Hypotensive, poor fluid intakes, lethargic.  Repeat BMP in a.m., may need more IV fluids this weekend.--12/7 improved, monitor    Latest Ref Rng & Units 11/12/2024    5:16 AM 11/11/2024    5:25 AM 11/08/2024    5:22 AM  BMP  Glucose 70 - 99 mg/dL 898  95  95   BUN 8 - 23 mg/dL 23  19  20    Creatinine 0.61 - 1.24 mg/dL 8.83  8.79  8.70   Sodium 135 - 145 mmol/L 140  137  135   Potassium 3.5 - 5.1 mmol/L 3.7  3.7  3.8   Chloride 98 - 111 mmol/L 100  100  99   CO2 22 - 32 mmol/L 28  27  26    Calcium  8.9 - 10.3 mg/dL 8.8  8.5  8.5     11. H/o Depression: was on Zoloft  50 mg w/Remeron  7.5 mg daily  12.  Hypocalcemia: Boderline low with ionized Calcium  1.14. Caltrate added.  13.  Steroid-induced hyperglycemia: resolved off steroid     Serum glucose ok at 111  14. GERD: continue PPI  15. Hypophonia  11/27- Hopefully SLP can help with this  16. Constipation  11/28- LBM    11/29 scheduled MiraLAX --increased to BID   -push po intake  12.5: large BM 17.  Hypotension, soft BP but no symptomatic orthostatic events   Vitals:   11/13/24 1930 11/14/24 0422  BP: 97/69 104/74  Pulse: 87 65  Resp: 19 18  Temp: 98.7 F (37.1 C) 98.5 F (36.9 C)  SpO2: 95% 95%   .   15 days A FACE TO FACE EVALUATION WAS PERFORMED  [name] 11/14/2024, 7:02 AM

## 2024-11-14 NOTE — Plan of Care (Signed)
  Problem: Consults Goal: RH BRAIN INJURY PATIENT EDUCATION Description: Description: See Patient Education module for eduction specifics Outcome: Progressing Goal: Nutrition Consult-if indicated Outcome: Progressing   Problem: RH BOWEL ELIMINATION Goal: RH STG MANAGE BOWEL WITH ASSISTANCE Description: STG Manage Bowel with mod I Assistance. Outcome: Progressing Goal: RH STG MANAGE BOWEL W/MEDICATION W/ASSISTANCE Description: STG Manage Bowel with Medication with mod I Assistance. Outcome: Progressing   Problem: RH BLADDER ELIMINATION Goal: RH STG MANAGE BLADDER WITH ASSISTANCE Description: STG Manage Bladder With toileting Assistance Outcome: Progressing   Problem: RH SKIN INTEGRITY Goal: RH STG SKIN FREE OF INFECTION/BREAKDOWN Description: Manage w min assist Outcome: Progressing Goal: RH STG MAINTAIN SKIN INTEGRITY WITH ASSISTANCE Description: STG Maintain Skin Integrity With min Assistance. Outcome: Progressing Goal: RH STG ABLE TO PERFORM INCISION/WOUND CARE W/ASSISTANCE Description: STG Able To Perform Incision/Wound Care With min Assistance. Outcome: Progressing   Problem: RH SAFETY Goal: RH STG ADHERE TO SAFETY PRECAUTIONS W/ASSISTANCE/DEVICE Description: STG Adhere to Safety Precautions With  cues Assistance/Device. Outcome: Progressing   Problem: RH COGNITION-NURSING Goal: RH STG USES MEMORY AIDS/STRATEGIES W/ASSIST TO PROBLEM SOLVE Description: STG Uses Memory Aids/Strategies With cues Assistance to Problem Solve. Outcome: Progressing   Problem: RH KNOWLEDGE DEFICIT BRAIN INJURY Goal: RH STG INCREASE KNOWLEDGE OF SELF CARE AFTER BRAIN INJURY Description: Patient and spouse will be able to manage care at discharge using educational resources independently Outcome: Progressing

## 2024-11-14 NOTE — Progress Notes (Signed)
 Speech Language Pathology Discharge Summary  Patient Details  Name: [name] MRN: [id number] Date of Birth: 1961/04/11  Date of Discharge from SLP service:November 14, 2024  Today's Date: 11/14/2024 SLP Individual Time: 8499-8470 SLP Individual Time Calculation (min): 29 min   Skilled Therapeutic Interventions:   Skilled therapy session focused on education goals. SLP facilitated session by educating patients family on patients current level of cognitive functioning and subsequent ST POC. SLP encouraged patients family to provided 24/7 supervision and assistance at home due to patients significant deficits in memory and problem solving. SLP prompted patients family to target cognitive goals post d/c as able including use of external aids and memory book to assist in orientation. Patients family verbalized understanding with no further questions. Patient left in chair with alarm set and call bell in reach. Continue POC     Patient has met 3 of 6 long term goals.  Patient to discharge at overall Mod;Max level.  Reasons goals not met: cont to require modA for problem solving and memory   Clinical Impression/Discharge Summary: Pt has made fair gains and has met 3 of 6 LTG's this admission due to improved dysphagia, orientation and sustained attention. Pt is currently an overall mod-maxA for cognitive tasks due to fluctuating cognitive status. Patient did not meet memory nor problem solving goals this admission due to continued requirement for maxA. Patient requires intermittent supervision cues for utilization of swallowing compensatory strategies to minimize overt s/sx of aspiration with reg/thin diet.Pt/family education complete and pt will discharge home with 24 hour supervision from friends/family/etc. Pt would benefit from f/u ST services to maximize cognition in order to maximize functional independence.   Care Partner:  Caregiver Able to Provide Assistance: Yes  Type of Caregiver  Assistance: Physical;Cognitive  Recommendation:  Outpatient SLP;Home Health SLP;24 hour supervision/assistance  Rationale for SLP Follow Up: Maximize cognitive function and independence   Equipment: n/a   Reasons for discharge: Discharged from hospital   Patient/Family Agrees with Progress Made and Goals Achieved: Yes    Erminie Foulks M.A., CCC-SLP 11/14/2024, 12:51 PM

## 2024-11-14 NOTE — Progress Notes (Addendum)
 Patient ID: [name], male   DOB: Nov 04, 1961, 63 y.o.   MRN: [id number] Contacted wife via telephone to confirm coming for family ed today she reports she and pt's sister will be here at 1:00 for family training. He does have a rolling walker at home. Home health set up via [facility]. See when here for education. Updated wife on team conference progress and need for 24/7 supervision at discharge.   1;44 PM Wife and sister here to participate in therapies wife asked if ready to come home and discussed his cognition will take time to recover and even if stayed here he would still need supervision level. Informed her she could appeal the discharge but would only give him another day or two which would not change his level of care. She will do her best at home and hopes he will wait for her when getting up

## 2024-11-14 NOTE — Plan of Care (Signed)
°  Problem: RH Swallowing Goal: LTG Pt will demonstrate functional change in swallow as evidenced by bedside/clinical objective assessment (SLP) Description: LTG: Patient will demonstrate functional change in swallow as evidenced by bedside/clinical objective assessment (SLP) Outcome: Completed/Met   Problem: RH Cognition - SLP Goal: RH LTG Patient will demonstrate orientation with cues Description:  LTG:  Patient will demonstrate orientation to person/place/time/situation with cues (SLP)   Outcome: Completed/Met   Problem: RH Attention Goal: LTG Patient will demonstrate this level of attention during functional activites (SLP) Description: LTG:  Patient will will demonstrate this level of attention during functional activites (SLP) Outcome: Completed/Met   Problem: RH Problem Solving Goal: LTG Patient will demonstrate problem solving for (SLP) Description: LTG:  Patient will demonstrate problem solving for basic/complex daily situations with cues  (SLP) Outcome: Not Met (cont to require up to maxA)   Problem: RH Memory Goal: LTG Patient will demonstrate ability for day to day (SLP) Description: LTG:   Patient will demonstrate ability for day to day recall/carryover during cognitive/linguistic activities with assist  (SLP) Outcome: Not Met (cont to require up to maxA) Goal: LTG Patient will use memory compensatory aids to (SLP) Description: LTG:  Patient will use memory compensatory aids to recall biographical/new, daily complex information with cues (SLP) Outcome: Not Met (cont to require up to maxA)

## 2024-11-14 NOTE — Progress Notes (Addendum)
 Occupational Therapy Discharge Summary  Patient Details  Name: [name] MRN: [id number] Date of Birth: 06/14/1961  Date of Discharge from OT service:November 14, 2024  Session 1:  Today's Date: 11/14/2024 OT Individual Time: 9054-8970 OT Individual Time Calculation (min): 44 min   Session 2:  Today's Date: 11/14/2024 OT Individual Time: 1415-1500 OT Individual Time Calculation (min): 45 min   Patient has met 7 of 7 long term goals due to improved activity tolerance, improved balance, postural control, ability to compensate for deficits, improved attention, improved awareness, and improved coordination.  Patient to discharge at overall Supervision level.  Patient's care partner is independent to provide the necessary physical and cognitive assistance at discharge.    Reasons goals not met: All goals met   Recommendation:  Patient will benefit from ongoing skilled OT services in home health setting to continue to advance functional skills in the area of BADL, iADL, and Reduce care partner burden.  Equipment: No equipment provided. Recommended use of RW 24/7 and purchasing a shower seat.   Reasons for discharge: treatment goals met and discharge from hospital  Patient/family agrees with progress made and goals achieved: Yes  OT Discharge Skilled Therapeutic Interventions/Progress Updates:  Pt received sitting up in recliner, dressed and ready for the day. Pt presenting to be in good spirits receptive to skilled OT session reporting 0/10 pain- OT offering intermittent rest breaks, repositioning, and therapeutic support to optimize participation in therapy session. Pt much more alert this session compared to earlier this week. He politely declined need for ADLs. Spent time at beginning of session completing grad day activities and reassessment of Pt's functional status- see below documentation.  Pt's brief noted to be soiled with Pt unaware, receptive to using restroom. He  completed ambulatory transfer to/from bathroom using RW with close SBA this session with MOD verbal cues required for initiation. Pt with continent void in toilet completing 3/3 toileting tasks with close SUP.   Remainder of session focused on HEP education and engaging Pt in completing HEP exercises to support learning and carryover at d/c. Issued Pt a handout and red therband with Pt completing the following exercises with OT providing a visual model and verbal cues for technique:  Exercises - Seated Upright Shoulder Row with Resistance Band with PLB  - 1 x daily - 7 x weekly - 3 sets - 10 reps - Seated Elbow Extension with Self-Anchored Resistance  - 1 x daily - 7 x weekly - 3 sets - 10 reps - Seated Shoulder Diagonal Pulls with Resistance  - 1 x daily - 7 x weekly - 3 sets - 10 reps - Seated Shoulder Horizontal Abduction with Resistance  - 1 x daily - 7 x weekly - 3 sets - 10 reps - Seated Elbow Flexion with Self-Anchored Resistance  - 1 x daily - 7 x weekly - 3 sets - 10 reps  Pt was left resting in recliner with call bell in reach, chair alarm on, and all needs met.    Session 2:  Pt received sitting up recliner presenting to be in good spirits receptive to skilled OT session reporting 0/10 pain- OT offering intermittent rest breaks, repositioning, and therapeutic support to optimize participation in therapy session. Pt's sister and wife present in room upon OT arrival for family education focused therapy session. Provided education on viral encephalopathy etiology/recovery process, fall prevention, energy conservation techniques, and simple home modifications to increase Pt's safety. Education provided on utilizing gait belt 24/7 at d/c.  Recommending Pt have 24/7 supervision at d/c, DME recommendations of shower seat and RW, and follow-up HHOT services with Pt and family in agreement with plan. Education provided on providing simple direct verbal and tactile cues to engage Pt in initiating  mobility and functional activities as Pt is most limited by his cognitive deficits. Engaged Pt's wife and sister in assisting with hands on transfers following demonstration and education on technique for bed, shower, and toilet transfers with emphasis on simple cues to provide, body mechanics to prevent injury, and OT providing feedback following. Pt's family members demonstrating appropriate insight into Pt's deficits and demonstrating teach back as evidence of learning. Pt and Pt's family members reporting all questions were answered at end of session. Pt fels prepared to d/c on 12/11. Pt was left resting in recliner with call bell in reach, chair alarm on, and all needs met.    Precautions/Restrictions  Precautions Precautions: Fall Recall of Precautions/Restrictions: Impaired Precaution/Restrictions Comments: R weakness, urinary incontinence Restrictions Weight Bearing Restrictions Per Provider Order: No Pain Pain Assessment Pain Scale: 0-10 Pain Score: 0-No pain ADL ADL Eating: Modified independent Where Assessed-Eating: Chair Grooming: Modified independent Where Assessed-Grooming: Sitting at sink Upper Body Bathing: Supervision/safety Where Assessed-Upper Body Bathing: Shower Lower Body Bathing: Supervision/safety Where Assessed-Lower Body Bathing: Shower Upper Body Dressing: Supervision/safety Where Assessed-Upper Body Dressing: Chair Lower Body Dressing: Supervision/safety Where Assessed-Lower Body Dressing: Chair Toileting: Supervision/safety Where Assessed-Toileting: Teacher, Adult Education: Close supervision Toilet Transfer Method: Surveyor, Minerals: Engineer, Technical Sales: Close supervison Web Designer Method: Engineer, Technical Sales: Insurance Underwriter: Close supervision Film/video Editor Method: Warden/ranger: Grab bars, Information systems manager with back Vision Baseline  Vision/History: 1 Wears glasses Vision Assessment?: Yes Eye Alignment: Within Functional Limits Ocular Range of Motion: Within Functional Limits Alignment/Gaze Preference: Within Defined Limits Tracking/Visual Pursuits: Able to track stimulus in all quads without difficulty Saccades: Within functional limits Convergence: Within functional limits (L eye is slow to converge, however Pt reporting no double vision) Visual Fields: No apparent deficits Depth Perception: Undershoots Perception  Perception: Within Functional Limits Praxis Praxis: Impaired Praxis Impairment Details: Motor planning Cognition Cognition Overall Cognitive Status: Impaired/Different from baseline Arousal/Alertness: Awake/alert Person: Oriented Place: Oriented Situation: Disoriented (fluctuates) Memory: Impaired Memory Impairment: Storage deficit;Retrieval deficit Attention: Selective Focused Attention: Appears intact Sustained Attention: Impaired Sustained Attention Impairment: Functional basic Selective Attention: Impaired Selective Attention Impairment: Functional basic Awareness: Impaired Awareness Impairment: Emergent impairment Problem Solving: Impaired Problem Solving Impairment: Functional basic Decision Making: Impaired Initiating: Impaired Self Monitoring: Impaired Self Correcting: Impaired Safety/Judgment: Impaired Brief Interview for Mental Status (BIMS) Repetition of Three Words (First Attempt): 3 Temporal Orientation: Year: Missed by 1 year Temporal Orientation: Month: Accurate within 5 days Temporal Orientation: Day: Correct Recall: Sock: No, could not recall Recall: Blue: No, could not recall Recall: Bed: No, could not recall BIMS Summary Score: 8 Sensation Sensation Light Touch: Appears Intact Coordination Gross Motor Movements are Fluid and Coordinated: No Fine Motor Movements are Fluid and Coordinated: No Coordination and Movement Description: requires increased time to  initiate and motor plan during functional mobility, fluctuates depending on alertness and fatigue Finger Nose Finger Test: undershooting and some dysmetria present bialerally R > L Motor  Motor Motor - Discharge Observations: slowed motor movements with increased time required for initiation, improved from inital eval Mobility  Bed Mobility Bed Mobility: Sit to Supine;Sitting - Scoot to Edge of Bed;Supine to Sit Supine to Sit: Independent with assistive device  Sitting - Scoot to [place] of Bed: Independent with assistive device Sit to Supine: Independent with assistive device Transfers Sit to Stand: Independent with assistive device Stand to Sit: Independent with assistive device  Trunk/Postural Assessment  Cervical Assessment Cervical Assessment: Exceptions to [facility] (midl forward head) Thoracic Assessment Thoracic Assessment: Exceptions to [facility] (mild rouned shoulders) Lumbar Assessment Lumbar Assessment: Within Functional Limits Postural Control Postural Control: Deficits on evaluation Righting Reactions: significantly delayed Protective Responses: significantly delayed/ diminished  Balance Balance Balance Assessed: Yes Static Sitting Balance Static Sitting - Balance Support: Bilateral upper extremity supported;Feet unsupported Static Sitting - Level of Assistance: 6: Modified independent (Device/Increase time) Dynamic Sitting Balance Dynamic Sitting - Balance Support: Bilateral upper extremity supported;Feet supported Dynamic Sitting - Level of Assistance: 6: Modified independent (Device/Increase time);5: Stand by assistance Static Standing Balance Static Standing - Balance Support: Bilateral upper extremity supported;During functional activity Static Standing - Level of Assistance: 5: Stand by assistance Dynamic Standing Balance Dynamic Standing - Balance Support: During functional activity Dynamic Standing - Level of Assistance: 5: Stand by assistance Extremity/Trunk  Assessment RUE Assessment RUE Assessment: Within Functional Limits General Strength Comments: Mild strength deficit 2/2 general deconditioning 4/5 overall LUE Assessment LUE Assessment: Within Functional Limits General Strength Comments: Mild strength deficit 2/2 general deconditioning 4/5 overall   Katheryn SQUIBB Woodson 11/14/2024, 9:54 AM

## 2024-11-15 ENCOUNTER — Other Ambulatory Visit (HOSPITAL_COMMUNITY): Payer: Self-pay

## 2024-11-15 DIAGNOSIS — B004 Herpesviral encephalitis: Secondary | ICD-10-CM | POA: Diagnosis not present

## 2024-11-15 LAB — BASIC METABOLIC PANEL WITH GFR
Anion gap: 8 (ref 5–15)
BUN: 21 mg/dL (ref 8–23)
CO2: 27 mmol/L (ref 22–32)
Calcium: 8.8 mg/dL — ABNORMAL LOW (ref 8.9–10.3)
Chloride: 101 mmol/L (ref 98–111)
Creatinine, Ser: 0.87 mg/dL (ref 0.61–1.24)
GFR, Estimated: >60 mL/min (ref >60)
Glucose, Bld: 88 mg/dL (ref 70–99)
Potassium: 3.7 mmol/L (ref 3.5–5.1)
Sodium: 136 mmol/L (ref 135–145)

## 2024-11-15 LAB — CBC
HCT: 39.7 % (ref 39.0–52.0)
Hemoglobin: 14.1 g/dL (ref 13.0–17.0)
MCH: 35 pg — ABNORMAL HIGH (ref 26.0–34.0)
MCHC: 35.5 g/dL (ref 30.0–36.0)
MCV: 98.5 fL (ref 80.0–100.0)
Platelets: 244 K/uL (ref 150–400)
RBC: 4.03 MIL/uL — ABNORMAL LOW (ref 4.22–5.81)
RDW: 16.2 % — ABNORMAL HIGH (ref 11.5–15.5)
WBC: 6.3 K/uL (ref 4.0–10.5)
nRBC: 0 % (ref 0.0–0.2)

## 2024-11-15 NOTE — Progress Notes (Addendum)
 PROGRESS NOTE   Subjective/Complaints: Patient is aware that he is being discharged home today He also correctly identified the day of the week, labs reviewed stable normal    11/15/2024    6:22 AM 11/14/2024    8:10 PM 11/14/2024    3:52 PM  Vitals with BMI  Systolic 106 128 889  Diastolic 75 89 70  Pulse 67 57 98    No results for input(s): GLUCAP in the last 72 hours.     ROS: Limited due to cognitive/behavioral    Objective:   No results found. Recent Labs    11/15/24 0440  WBC 6.3  HGB 14.1  HCT 39.7  PLT 244    Recent Labs    11/15/24 0440  NA 136  K 3.7  CL 101  CO2 27  GLUCOSE 88  BUN 21  CREATININE 0.87  CALCIUM  8.8*    Intake/Output Summary (Last 24 hours) at 11/15/2024 9177 Last data filed at 11/14/2024 1912 Gross per 24 hour  Intake 413 ml  Output --  Net 413 ml        Physical Exam: Vital Signs Blood pressure 106/75, pulse 67, temperature 98 F (36.7 C), resp. rate 18, height 5' 9 (1.753 m), weight 77.2 kg, SpO2 93%.  General: No acute distress Mood and affect are appropriate Heart: Regular rate and rhythm no rubs murmurs or extra sounds Lungs: Clear to auscultation, breathing unlabored, no rales or wheezes Abdomen: Positive bowel sounds, soft nontender to palpation, nondistended Extremities: No clubbing, cyanosis, or edema Skin: No evidence of breakdown, no evidence of rash   Neurologic:decreased insight into deficits oriented to hospital in [place] , day but not date or month or year  vocal volume  ok  limited insight and awareness.   cranial nerves II through XII intact,  motor 5/5 in BUE and BLE   Musculoskeletal: No joint swelling noted. No pain with ROM   Physical exam unchanged from the above on reexamination 11/15/2024    Assessment/Plan: 1. Functional deficits herpes encephalitis Stable for D/C today F/u PCP in 3-4 weeks F/u PM&R 1-2 months F/u  Neuro 4-6 weeks F/u ID 1-2 weeks  See D/C summary See D/C instructions No driving until cleared by neurology Unable to return to work, we we will reevaluate during outpatient visit.  He may be permanently disabled depending on his degree of recovery Care Tool:  Bathing    Body parts bathed by patient: Right arm, Left arm, Chest, Abdomen, Face, Front perineal area, Buttocks, Right upper leg, Right lower leg, Left upper leg, Left lower leg   Body parts bathed by helper: Front perineal area, Buttocks, Right upper leg, Left upper leg, Right lower leg, Left lower leg     Bathing assist Assist Level: Supervision/Verbal cueing     Upper Body Dressing/Undressing Upper body dressing   What is the patient wearing?: Pull over shirt    Upper body assist Assist Level: Supervision/Verbal cueing    Lower Body Dressing/Undressing Lower body dressing      What is the patient wearing?: Pants, Incontinence brief     Lower body assist Assist for lower body dressing: Supervision/Verbal cueing  Toileting Toileting    Toileting assist Assist for toileting: Supervision/Verbal cueing     Transfers Chair/bed transfer  Transfers assist     Chair/bed transfer assist level: Supervision/Verbal cueing     Locomotion Ambulation   Ambulation assist      Assist level: Contact Guard/Touching assist Assistive device: Walker-rolling Max distance: 200 ft   Walk 10 feet activity   Assist     Assist level: Supervision/Verbal cueing Assistive device: Walker-rolling   Walk 50 feet activity   Assist Walk 50 feet with 2 turns activity did not occur: Safety/medical concerns  Assist level: Contact Guard/Touching assist Assistive device: Walker-rolling    Walk 150 feet activity   Assist Walk 150 feet activity did not occur: Safety/medical concerns  Assist level: Contact Guard/Touching assist Assistive device: Walker-rolling    Walk 10 feet on uneven surface   activity   Assist Walk 10 feet on uneven surfaces activity did not occur: Safety/medical concerns   Assist level: Minimal Assistance - Patient > 75% Assistive device: Hand held assist   Wheelchair     Assist Is the patient using a wheelchair?: Yes Type of Wheelchair: Manual    Wheelchair assist level: Supervision/Verbal cueing Max wheelchair distance: 100 ft    Wheelchair 50 feet with 2 turns activity    Assist        Assist Level: Supervision/Verbal cueing   Wheelchair 150 feet activity     Assist      Assist Level: Moderate Assistance - Patient 50 - 74%   Blood pressure 106/75, pulse 67, temperature 98 F (36.7 C), resp. rate 18, height 5' 9 (1.753 m), weight 77.2 kg, SpO2 93%.  Medical Problem List and Plan: 1. 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  Antithrombotics: -DVT/anticoagulation:  Pharmaceutical: Lovenox              -antiplatelet therapy:  3. Pain Management: tylenol  prn 4. Mood/Behavior/Sleep: LCSW to follow for evaluation and support.             --Melatonin prn for insomnia             -antipsychotic agents: N/A  Intermittent lethargy on RItalin  for hospital use but will d/c prior to discharge date   5. Neuropsych/cognition: This patient is not fully capable of making decisions on his own behalf. 6. Skin/Wound Care: Routine pressure relief  measures.  7. Fluids/Electrolytes/Nutrition: Monitor I/O. Check CMET in am. Monitor renal status closely  -11/30 intake was recorded yesterday and was 75-100% for all meals   Normal prealbumin on  12/1  8.  Herpes encephalitis: Continue IV acyclovir  thru 11/07/24             --IVF for hydration while on ayclovir to prevent renal injury.             --was on decadron  -tapered off   9. Seizures: Continue Keppra  1000 mg bid, controlled check Levetiracetam  level today to see if this may be contributing to delayed processing - if elevated will drop dose  10. Pre-renal azotemia: Encourage fluid intake--off IVF f/u [facility] Monday   11/29  renal function/labs stable to improved.   12/6: Hypotensive, poor fluid intakes, lethargic.  Repeat BMP in a.m., may need more IV fluids this weekend.--12/7 improved, monitor    Latest Ref Rng & Units 11/15/2024    4:40 AM 11/12/2024    5:16 AM 11/11/2024    5:25 AM  BMP  Glucose 70 - 99 mg/dL 88  898  95   BUN 8 - 23 mg/dL 21  23  19    Creatinine 0.61 - 1.24 mg/dL 9.12  8.83  8.79   Sodium 135 - 145 mmol/L 136  140  137   Potassium 3.5 - 5.1 mmol/L 3.7  3.7  3.7   Chloride 98 - 111 mmol/L 101  100  100   CO2 22 - 32 mmol/L 27  28  27    Calcium  8.9 - 10.3 mg/dL 8.8  8.8  8.5     11. H/o Depression: was on Zoloft  50 mg w/Remeron  7.5 mg daily  12.  Hypocalcemia: Boderline low with ionized Calcium  1.14. Caltrate added.  13.  Steroid-induced hyperglycemia: resolved off steroid     Serum glucose ok at 111  14. GERD: continue PPI  15. Hypophonia  11/27- Hopefully SLP can help with this  16. Constipation  11/28- LBM    11/29 scheduled MiraLAX --increased to BID   -push po intake  12.5: large BM 17.  Hypotension, soft BP but no symptomatic orthostatic events   Vitals:   11/14/24 2010 11/15/24 0622  BP: 128/89 106/75  Pulse: (!) 57 67  Resp: 18 18  Temp: 98.4 F (36.9 C) 98 F (36.7 C)  SpO2: 100% 93%   .   16 days A FACE TO FACE EVALUATION WAS PERFORMED  Prentice FORBES Compton 11/15/2024, 8:22 AM

## 2024-11-15 NOTE — Progress Notes (Signed)
 Inpatient Rehabilitation Care Coordinator Discharge Note   Patient Details  Name: [name] MRN: [id number] Date of Birth: [birth year]   Discharge location: HOME WITH WIFE WHO CAN PROVIDE 24/7 SUPERVISION LEVEL  Length of Stay: 16 DAYS  Discharge activity level: SUPERVISION-CGA LEVEL  Home/community participation: ACTIVE  Patient response un:Yzjouy Literacy - How often do you need to have someone help you when you read instructions, pamphlets, or other written material from your doctor or pharmacy?: Rarely  Patient response un:Dnrpjo Isolation - How often do you feel lonely or isolated from those around you?: Rarely  Services provided included: MD, RD, PT, OT, SLP, RN, CM, TR, Pharmacy, Neuropsych, SW  Financial Services:  Field Seismologist Utilized: Barrister's Clerk  Choices offered to/list presented to: WIFE  Follow-up services arranged:  Home Health, Patient/Family has no preference for HH/DME agencies Home Health Agency: [facility]  PT  OT  SP     HAS DME FROM PAST ADMISSIONS AND OTHER FAMILY MEMBERS    Patient response to transportation need: Is the patient able to respond to transportation needs?: Yes In the past 12 months, has lack of transportation kept you from medical appointments or from getting medications?: No In the past 12 months, has lack of transportation kept you from meetings, work, or from getting things needed for daily living?: No   Patient/Family verbalized understanding of follow-up arrangements:  Yes  Individual responsible for coordination of the follow-up plan: [name]-WIFE [phone]  Confirmed correct DME delivered: Raymonde [name] 11/15/2024    Comments (or additional information): WIFE AND SISTER WERE HERE YESTERDAY FOR EDUCATION AND ARE AWARE OF HIS CARE NEEDS. WIFE WAS ASKING IF READY TO GO HOME AND WANTED HIM TO BE BETTER COGNITIVELY MADE AWARE COULD TAKE MONTHS FOR THIS TO IMPROVE ACCORDING TO MD.   Summary of  Stay    Date/Time Discharge Planning CSW  11/14/24 [phone] HOme with wife who is aware of his need for 24/7 supervision, she does work from home. She is to be here today from 1:00-4:00 pm to do hands on education in prepartion for discharge home tomorrow. HOme health in place await DME recommendations RGD  11/07/24 0853 HOme with wife who works from home and will need to come in for family education prior to discharge. Pt is physically doing well but his main issue are cognition RGD  10/31/24 0838 New evaluation-home with wife who is able to assist will confirm the discharge plan RGD       Timothy Trudell G

## 2024-11-15 NOTE — Patient Care Conference (Signed)
 Inpatient RehabilitationTeam Conference and Plan of Care Update Date: 11/14/2024   Time: 10:40 AM    Patient Name: [name]      Medical Record Number: [id number]  Date of Birth: 12-12-1960 Sex: Male         Room/Bed: 4W15C/4W15C-01 Payor Info: Payor: BLUE CROSS BLUE [employer]IELD / Plan: BCBS COMM PPO / Product Type: *No Product type* /    Admit Date/Time:  10/30/2024  4:54 PM  Primary Diagnosis:  Herpesviral encephalitis  Hospital Problems: Principal Problem:   Herpesviral encephalitis Active Problems:   Seizure [facility])    Expected Discharge Date: Expected Discharge Date: 11/15/24  Team Members Present: Physician leading conference: Dr. Prentice Compton Social Worker Present: Rhoda Clement, LCSW Nurse Present: Barnie Ronde, RN PT Present: Recardo Milliner, PT OT Present: Katheryn Mines, OT SLP Present: Blaise Alderman, SLP PPS Coordinator present : Eleanor Colon, SLP     Current Status/Progress Goal Weekly Team Focus  Bowel/Bladder   Continent of bowel and incontinent of bladder; LBM 11/13/24   Remain continent of bowel and less periods of incontinence of bladder.   Assess bowel and bladder needs q2-q3 hour while awake and PRN.    Swallow/Nutrition/ Hydration   reg/thin           ADL's   SUP overall for ADLs, SUP-CGA for transfers using RW; task initiation/awareness/attention fluctuates // Barriers: fatigued, cognition, poor awareness into deficits   SUP   functional cognition, ADL retraining, functional transfer trainig, Pt education    Mobility   cognitive decline noted since end of antiviral /// Bed mobility = can be supervision but SUPER slow and now requires ModA to initiate, transfers = same - ModA to initiate with max cues to complete with CGA/ SBA, Ambulation = up to 150 ft and back to requiring MinA with and without RW d/t pauses in ambulation and LOB.   overall supervision  Barriers: cognition, slow to initiatie, slow to process /// Work On: standing balance,  quality of gait, initiation and motor processing, transfer training, family educ    Communication   improved dysphonia            Safety/Cognition/ Behavioral Observations  maxA   modA   orientation, problem solving, memory, attention, processing speed    Pain   Patient denies pain.   Remain free from pain.   Assess pain q shift and PRN.    Skin   Skin is intact.   Remain free from skin breakdown.  Assess skin q shift and PRN.      Discharge Planning:  HOme with wife who is aware of his need for 24/7 supervision, she does work from home. She is to be here today from 1:00-4:00 pm to do hands on education in prepartion for discharge home tomorrow. HOme health in place await DME recommendations   Team Discussion: Patient admitted post HSV encephalitis with disorientation, slow initiation, poor po intake and delayed processing. Completed IV antiviral treatment 11/08/2024. Treatment team noted a significant change in function when the antiviral medication was completed; and continued fluctuations in functional level and cognition.  Continue to work on simple orientation, and balance. Needs cues for stepping to walk 150'. Patient has no protective response to loss of balance.   Patient on target to meet rehab goals: Patient requires supervision for ADLs and needs cues to maintain attention due to visual and auditory distractions  *See Care Plan and progress notes for long and short-term goals.   Revisions to Treatment Plan:  Removal of PICC line Checking Keppra  level UA (negative)   Teaching Needs: Safety, cues for stepping and redirection, medications, dietary modification, transfers, toileting, etc.   Current Barriers to Discharge: Decreased caregiver support  Possible Resolutions to Barriers: Family education 11/15/2024     Medical Summary Current Status: Fluctuating LOA, poor initiation despite ritalin  trial, UA negative  Barriers to Discharge: Medical  stability;Behavior/Mood   Possible Resolutions to [employer]: so far no sig changes on methylphenidate , await Keppra  level , if high may reduce dose   Continued Need for Acute Rehabilitation Level of Care: The patient requires daily medical management by a physician with specialized training in physical medicine and rehabilitation for the following reasons: Direction of a multidisciplinary physical rehabilitation program to maximize functional independence : Yes Medical management of patient stability for increased activity during participation in an intensive rehabilitation regime.: Yes Analysis of laboratory values and/or radiology reports with any subsequent need for medication adjustment and/or medical intervention. : Yes   I attest that I was present, lead the team conference, and concur with the assessment and plan of the team.   Fredericka Sober B 11/15/2024, 9:11 AM

## 2024-11-15 NOTE — Plan of Care (Signed)
 Below goals deemed adequate for d/c d/t family's ability to provide appropriate LOA for safety. During rehab stay, pt demonstrated regression following completion of antiviral course and has been slowly progressing but did not reach all LTG levels prior to d/c due to severity of HSV encephalitis with cerebral edema . Is expected to continue to slowly progress with continued skilled therapy. Problem: RH Balance Goal: LTG Patient will maintain dynamic standing balance (PT) Description: LTG:  Patient will maintain dynamic standing balance with assistance during mobility activities (PT) Outcome: Adequate for Discharge Flowsheets (Taken 11/15/2024 0702) LTG: Pt will maintain dynamic standing balance during mobility activities with:: Supervision/Verbal cueing   Problem: RH Bed to Chair Transfers Goal: LTG Patient will perform bed/chair transfers w/assist (PT) Description: LTG: Patient will perform bed to chair transfers with assistance (PT). Outcome: Adequate for Discharge Flowsheets (Taken 11/15/2024 0702) LTG: Pt will perform Bed to Chair Transfers with assistance level: Supervision/Verbal cueing   Problem: RH Car Transfers Goal: LTG Patient will perform car transfers with assist (PT) Description: LTG: Patient will perform car transfers with assistance (PT). Outcome: Adequate for Discharge Flowsheets (Taken 11/15/2024 0702) LTG: Pt will perform car transfers with assist:: Contact Guard/Touching assist   Problem: RH Furniture Transfers Goal: LTG Patient will perform furniture transfers w/assist (OT/PT) Description: LTG: Patient will perform furniture transfers  with assistance (OT/PT). Outcome: Adequate for Discharge Flowsheets (Taken 10/31/2024 1437) LTG: Pt will perform furniture transfers with assist:: Supervision/Verbal cueing   Problem: RH Ambulation Goal: LTG Patient will ambulate in controlled environment (PT) Description: LTG: Patient will ambulate in a controlled environment, # of  feet with assistance (PT). Outcome: Adequate for Discharge Flowsheets Taken 11/15/2024 0702 LTG: Pt will ambulate in controlled environ  assist needed:: Contact Guard/Touching assist Taken 10/31/2024 1437 LTG: Ambulation distance in controlled environment: more than150 ft using LRAD Goal: LTG Patient will ambulate in home environment (PT) Description: LTG: Patient will ambulate in home environment, # of feet with assistance (PT). Outcome: Adequate for Discharge Flowsheets Taken 11/15/2024 0702 LTG: Pt will ambulate in home environ  assist needed:: Contact Guard/Touching assist Taken 10/31/2024 1437 LTG: Ambulation distance in home environment: up to 50 ft per bout using LRAD and navigating all turns and safely maneuvering obstacles   Problem: Sit to Stand Goal: LTG:  Patient will perform sit to stand with assistance level (PT) Description: LTG:  Patient will perform sit to stand with assistance level (PT) Outcome: Completed/Met Flowsheets (Taken 10/31/2024 1437) LTG: PT will perform sit to stand in preparation for functional mobility with assistance level: Independent with assistive device   Problem: RH Bed Mobility Goal: LTG Patient will perform bed mobility with assist (PT) Description: LTG: Patient will perform bed mobility with assistance, with/without cues (PT). Outcome: Completed/Met Flowsheets (Taken 10/31/2024 1437) LTG: Pt will perform bed mobility with assistance level of: Independent with assistive device    Problem: RH Stairs Goal: LTG Patient will ambulate up and down stairs w/assist (PT) Description: LTG: Patient will ambulate up and down # of stairs with assistance (PT) Outcome: Completed/Met Flowsheets (Taken 10/31/2024 1437) LTG: Pt will ambulate up/down stairs assist needed:: Contact Guard/Touching assist LTG: Pt will  ambulate up and down number of stairs: at least one curb step using LRAD in order to mobilize in community

## 2024-11-15 NOTE — Progress Notes (Signed)
 Physical Therapy Session Note  Patient Details  Name: [name] MRN: [id number] Date of Birth: 1961-10-20  Today's Date: 11/14/2024 PT Individual Time: 9171-9153 PT Individual Time Calculation (min): 18 min and Today's Date: 11/14/2024 PT Missed Time: 27 Minutes Missed Time Reason: Other (Eating Breakfast); Toileting  Short Term Goals: Week 1:  PT Short Term Goal 1 (Week 1): Pt will perform bed mobility with overall SBA and improved initiation with no more than 2 vc. PT Short Term Goal 1 - Progress (Week 1): Met PT Short Term Goal 2 (Week 1): Pt will perform sit<>stand with overall SBA and improved initiation with no more than 2 vc. PT Short Term Goal 2 - Progress (Week 1): Met PT Short Term Goal 3 (Week 1): Pt will performstand pivot transfers with consistent CGA  and using RW with reduced vc for positioning. PT Short Term Goal 3 - Progress (Week 1): Met PT Short Term Goal 4 (Week 1): Pt will ambulate at least 75 ft using RW with consistent CGA. PT Short Term Goal 4 - Progress (Week 1): Met PT Short Term Goal 5 (Week 1): Pt will initiate step/ stair training. PT Short Term Goal 5 - Progress (Week 1): Met Week 2:  PT Short Term Goal 1 (Week 2): STG = LTG d/t ELOS  Skilled Therapeutic Interventions/Progress Updates:  Pt missed 27 min of skilled therapy at start of session due to completing breakfast and then toileting with NT. Will re-attempt as schedule and pt availability permits.    Patient supine in bed on entrance to room. Patient alert and agreeable to PT session.   Patient with no pain complaint at start of session.  Has clean clothing in closet and brought pt a t-shirt and pajama pants. Educated pt that he is still demonstrating delayed processing and very slow initiation for all movement. Related that 15 minutes are left in session and within time, pt expected to doff dirty gown, don fresh clothing and end by sitting in recliner. Pt agreeable but makes no move to exit  bed. Required heavy tactile cue of hand over hand assist to bring RUE to L side bedrail and with extensive vc is able to reach sidelying. Reaches out for therapist's hand for assist to sit up. Provided cues to push from bed surface. Takes time to finally push self up to seated position on [name].   Seated upright, provided pt with fresh t-shirt and cued to doff gown. Requires extra time again to initiate with max vc and countdown of remaining time. Slowly doffs gown and when provided with t-shirt, he dons. Handed pt pants and he picks up feet to thread in legs of pants with MinA from therapist. Is then able to reach down and pull up to highest point in seated position. Encouraged pt to stand and provided with remaining time left. Continued need for extensive vc and gentle tc for forward lean. Stands to pull up pants. Recognizes that he does not have shoes on. Performs stand pivot with HHA to recliner. With vc is able to doff grip socks, don athletic socks and push feet into step-in shoes.   Patient seated upright in recliner at end of session with brakes locked, seat pad alarm set, and all needs within reach.   Therapy Documentation Precautions:  Precautions Precautions: Fall Recall of Precautions/Restrictions: Impaired Precaution/Restrictions Comments: R weakness, urinary incontinence Restrictions Weight Bearing Restrictions Per Provider Order: No  Pain:  No pain related this session.   Therapy/Group: Individual Therapy  [name] DELENA Milliner PT, DPT, CSRS 11/14/2024, 5:56 PM

## 2024-11-15 NOTE — Progress Notes (Signed)
 Physical Therapy Discharge Summary  Patient Details  Name: [name] MRN: [id number] Date of Birth: 1961-10-22  Date of Discharge from PT service:November 14, 2024  oday's Date: 11/14/2024 PT Individual Time: 1302-1411 PT Individual Time Calculation (min): 69 min    Patient has met 3 of 9 long term goals due to improved activity tolerance, improved balance, and increased strength.  Patient to discharge at an ambulatory level CGA.   Patient's care partner is independent to provide the necessary physical and cognitive assistance at discharge.  Reasons goals not met: Most long term goals not met but deemed adequate for d/c d/t family's ability to provide appropriate LOA for safety. Pt only one level away from reaching goals. During rehab stay, pt demonstrated regression following completion of antiviral course and has been slowly progressing but did not reach all LTG levels prior to d/c due to severity of HSV encephalitis with cerebral edema . Is expected to continue to slowly progress with continued skilled therapy.  Recommendation:  Patient will benefit from ongoing skilled PT services in home health setting to continue to advance safe functional mobility, address ongoing impairments in strength, coordination, balance, activity tolerance, cognition, safety awareness, and to minimize fall risk.  Equipment: No equipment provided  Reasons for discharge: treatment goals met and discharge from hospital  Patient/family agrees with progress made and goals achieved: Yes  PT Discharge Precautions/Restrictions Precautions Precautions: Fall Precaution/Restrictions Comments: R weakness, urinary incontinence Restrictions Weight Bearing Restrictions Per Provider Order: No Pain Pain Assessment Pain Scale: 0-10 Pain Score: 0-No pain Pain Interference Pain Interference Pain Effect on Sleep: 0. Does not apply - I have not had any pain or hurting in the past 5 days Pain Interference with  Therapy Activities: 0. Does not apply - I have not received rehabilitationtherapy in the past 5 days Pain Interference with Day-to-Day Activities: 1. Rarely or not at all Vision/Perception  Vision - History Ability to See in Adequate Light: 0 Adequate Perception Perception: Within Functional Limits Praxis Praxis: Impaired Praxis Impairment Details: Initiation  Cognition Overall Cognitive Status: Impaired/Different from baseline Arousal/Alertness: Awake/alert Orientation Level: Oriented to person;Oriented to place;Oriented to time Attention: Sustained;Selective Sustained Attention: Impaired Selective Attention: Impaired Memory: Impaired Awareness: Impaired Problem Solving: Impaired Executive Function: Decision Making;Initiating;Self Monitoring;Self Correcting Decision Making: Impaired Initiating: Impaired Self Monitoring: Impaired Self Correcting: Impaired Safety/Judgment: Impaired Sensation Sensation Light Touch: Appears Intact Coordination Gross Motor Movements are Fluid and Coordinated: No Fine Motor Movements are Fluid and Coordinated: No Coordination and Movement Description: requires increased time to initiate and motor plan during functional mobility, fluctuates depending on alertness and fatigue Motor  Motor Motor: Other (comment) Motor - Discharge Observations: slowed motor movements with increased time required for initiation, improved from inital eval  Mobility Bed Mobility Bed Mobility: Sit to Supine;Sitting - Scoot to Edge of Bed;Supine to Sit Supine to Sit: Independent with assistive device Sitting - Scoot to Edge of Bed: Independent with assistive device Sit to Supine: Independent with assistive device Transfers Transfers: Sit to Stand;Stand to Sit;Stand Pivot Transfers Sit to Stand: Independent with assistive device Stand to Sit: Independent with assistive device Stand Pivot Transfers: Supervision/Verbal cueing Stand Pivot Transfer Details: Verbal cues  for sequencing;Verbal cues for precautions/safety;Verbal cues for gait pattern;Verbal cues for safe use of DME/AE Stand Pivot Transfer Details (indicate cue type and reason): requires intermittent cues Locomotion  Gait Ambulation: Yes Gait Assistance: Contact Guard/Touching assist Gait Distance (Feet): 200 Feet Assistive device: Rolling walker Gait Gait: Yes Gait Pattern: Impaired Gait Pattern:  Step-through pattern (intermittent pauses in gait leaving pt in NBOS position) Gait velocity: Decreased pace requiring vc for timing and to increase frequency of step - improved from eval Stairs / Additional Locomotion Stairs: Yes Stairs Assistance: Contact Guard/Touching assist (significant cueing required for initiation and performance) Number of Stairs: 2 Height of Stairs: 4 Ramp: Contact Guard/touching assist Wheelchair Mobility Wheelchair Mobility: No  Trunk/Postural Assessment  Cervical Assessment Cervical Assessment: Exceptions to WFL (midl forward head) Thoracic Assessment Thoracic Assessment: Exceptions to [facility] (mild rouned shoulders) Lumbar Assessment Lumbar Assessment: Within Functional Limits Postural Control Postural Control: Deficits on evaluation Righting Reactions: significantly delayed Protective Responses: significantly delayed/ diminished  Balance Balance Balance Assessed: Yes Static Sitting Balance Static Sitting - Balance Support: Bilateral upper extremity supported;Feet unsupported Static Sitting - Level of Assistance: 6: Modified independent (Device/Increase time) Dynamic Sitting Balance Dynamic Sitting - Balance Support: Bilateral upper extremity supported;Feet supported Dynamic Sitting - Level of Assistance: 6: Modified independent (Device/Increase time);5: Stand by assistance Static Standing Balance Static Standing - Balance Support: Bilateral upper extremity supported;During functional activity Static Standing - Level of Assistance: 5: Stand by  assistance Dynamic Standing Balance Dynamic Standing - Balance Support: During functional activity Dynamic Standing - Level of Assistance: Other (comment) (CGA) Extremity Assessment      RLE Assessment RLE Assessment: Exceptions to [facility] General Strength Comments: functionally 4+/5 LLE Assessment LLE Assessment: Exceptions to [facility] General Strength Comments: functionally 4+/5  Skilled Intervention: PT instructed pt in Grad day assessment to measure progress toward goals. See below for details. CARETool mobility assessment  also completed; see CAREtool tab in navigator for details.  Patient seated upright in recliner on entrance to room. Patient alert and agreeable to PT session.   Patient with no pain complaint at start of session.  Wife and sister present for family education. Discussed pt's continuing cognitive deficits and processing limitations.   Pt ambulates throughout the department while PT demonstrates to family need for CGA/ supervision with use of RW in order to improve balance at home. Wife is able to return demonstrate and by end of session is providing appropriate LOA for ambulation.   Demonstrated to wife and sister that pt can perform transfers without physical assist, however requires extra time and continuous encouragement to participate, initiate, and perform. Performs car transfer with step in technique and using hand hold to car door. Reminded pt  and educated family not use car door to steady self. Educated pt and family that he may want to sit first and then bring feet in.  Had wife ambulate with pt to main gym and practice stairs. Pt's wife relates there are no STE home but there are 2 steps in home  to reach pt's bedroom. However, she has moved him to a different room so that he would not have to perform steps. Demonstrated to wife how to assist pt with steps and provide forearm support to perform. She is able to return demonstrate and provides appropriate cueing.    Returned to room, Pt's sister provides assist to pt to stand from w/c and return to recliner. She attempts to push and provide too much help to pt. Corrected sister and re-educated that he can actually perform, he just may need tc for gentle guidance in what is needed from him.   Reminded family that counting to stand seems to help at this time and to continue to utilize at home.   Excessive time required to initiate all movement this session but overall, with use of  RW requires SBA/ CGA for all mobility.   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 He has made very good progress here in IPR. Next level of care is HHPT which will work to continue the progress made in IPR. And also recommended OPPT at neuro based clinic to continue after HHPT signs off from care.   Patient seated upright in recliner at end of session with brakes locked, belt alarm set, and all needs within reach.  [name] DELENA Milliner PT, DPT, CSRS 11/14/2024, 4:39 PM

## 2024-11-15 NOTE — Discharge Summary (Signed)
 Physician Discharge Summary  Patient ID: [name] MRN: [id number] DOB/AGE: 12-12-1960 63 y.o.  Admit date: 10/30/2024 Discharge date: 11/15/2024  Discharge Diagnoses:  Principal Problem:   Herpesviral encephalitis Active Problems:   GERD (gastroesophageal reflux disease)   Seizure (HCC)   Constipation   Hypocalcemia   Discharged Condition: stable  Significant Diagnostic Studies: N/A   Labs:  Basic Metabolic Panel: Recent Labs  Lab 11/11/24 0525 11/12/24 0516 11/15/24 0440  NA 137 140 136  K 3.7 3.7 3.7  CL 100 100 101  CO2 27 28 27   GLUCOSE 95 101* 88  BUN 19 23 21   CREATININE 1.20 1.16 0.87  CALCIUM  8.5* 8.8* 8.8*    CBC: Recent Labs  Lab 11/12/24 0516 11/15/24 0440  WBC 6.3 6.3  HGB 12.9* 14.1  HCT 37.4* 39.7  MCV 100.0 98.5  PLT 187 244    CBG: No results for input(s): GLUCAP in the last 168 hours.  Brief HPI:   [name] is a 63 y.o. male with history of GERD, Left hip AVN, DDD lumbar spine, 3 week history of cough, congestion and HA treated with antibiotics without improvement and progressing to MS changes with confusion. He was admitted on 10/17/24 with fever, AKI, lactic acidosis due to sepsis and started on broad spectrum antibiotics.  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 Hospital course significant for decline in LOC concerning for seizure and he was intubated for airway protection and loaded with Keppra . LT-EEG showed evidence of epileptogenicity arising from left hemisphere.   He did develop left sided weakness with CT head showing marked progression of edema in mesial left temporal lobe and insula extending to left > right lobes c/w progressive herpes encephalitis. ID recommended 3 week course of Acyclovir  with  7-10 day course of steroids as well as 5 day course of IV antibiotics for CAP/HAP.  Neurology recommended continuing Keppra  indefinitely for now and he was extubated without  difficulty on 11/19.  Therapy consulted for evaluations and patient was noted to have dysphonia with delay in processing and sequencing, fatigue and balance deficits. He was requiring min to max assist with ADLs and mod to max assist with pre-gait activity. He was independent and working PTA. CIR recommended due to functional decline.    Hospital Course: [name] was admitted to rehab 10/30/2024 for inpatient therapies to consist of PT, ST and OT at least three hours five days a week. Past admission physiatrist, therapy team and rehab RN have worked together to provide customized collaborative inpatient rehab. His blood pressures were monitored on TID basis and has been stable. He has been seizure free on current dose Keppra  but has continued to have bouts of lethargy. Ritalin  was added for activation but discharged at discharge as not fully effective. He completed his course of IV acyclovir  thru 12/03 and was kept on IVF for hydration during treatment.   Serial check of BMET showed AKI which has resolved and lytes WNL. Hyperglycemia due to steroids has resolved with its discontinuation.  Caltrate added due to boderline low ionized calcium  @ 1.14.  Follow up CBC showed H/H to be stable and is WNL. Bowel program augmented to help manage constipation. He has continued to fluctuate and has made slow and inconsistent progress during his stay. 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 He will continue to receive follow up HHPT, HHOT, HHST by [facility] after discharge.  Rehab course: During patient's stay in rehab weekly team conferences were held to monitor patient's progress, set goals and discuss barriers to discharge. At admission, patient required max assist with basic self care tasks and mod assist with mobility.  He exhibited moderate cognitive linguistic deficits with severe dysphonia, delay in processing and delay in verbal responses.  He has  had improvement in activity tolerance, balance, postural control as well as ability to compensate for deficits. He requires supervision with cues for transfers and is able to ambulate 200' with use of RW and CGA. He is able to climb 2 stairs with CGA.Swallow function has improved and he requires intermittent supervision for safe swallow strategies of regular textures diet with thin liquids.  He requires mod to max assist with all cognitive tasks due to fluctuating cognitive status.  Family education has been completed.   Discharge disposition: 06-Home-Health Care Svc  Diet: Regular.   Special Instructions: Family to assist with medications and cognitive tasks.  2. Per [place] las has to be seizure free for 6 months. Will need to be cleared by neurology prior to resuming driving.    Discharge Instructions     Ambulatory referral to Neurology   Complete by: As directed    An appointment is requested in approximately: HSV encephalitis/Seizures   Ambulatory referral to Physical Medicine Rehab   Complete by: As directed    Hospital follow up      Allergies as of 11/15/2024       Reactions   Bee Venom Rash   Morphine And Codeine Swelling        Medication List     STOP taking these medications    esomeprazole 20 MG capsule Commonly known as: NEXIUM   mirtazapine  7.5 MG tablet Commonly known as: REMERON    ondansetron  4 MG tablet Commonly known as: Zofran    sertraline  50 MG tablet Commonly known as: ZOLOFT        TAKE these medications    acetaminophen  325 MG tablet Commonly known as: TYLENOL  Take 1-2 tablets (325-650 mg total) by mouth every 4 (four) hours as needed for mild pain (pain score 1-3).   Airsupra  90-80 MCG/ACT Aero Generic drug: Albuterol -Budesonide  Inhale 2 puffs into the lungs as needed (wheezing, shortness of breath). Max of 12 puffs per 24 hours   calcium  citrate 950 (200 Ca) MG tablet Commonly known as: CALCITRATE - dosed in mg elemental calcium  Take  1 tablet (950 mg total) by mouth daily with lunch.   famotidine  20 MG tablet Commonly known as: Pepcid  Take 1 tablet (20 mg total) by mouth 2 (two) times daily as needed for heartburn or indigestion (breakthrough not controlled by Protonix ).   fluticasone  50 MCG/ACT nasal spray Commonly known as: FLONASE  Place 2 sprays into both nostrils daily.   levETIRAcetam  1000 MG tablet Commonly known as: KEPPRA  Take 1 tablet (1,000 mg total) by mouth 2 (two) times daily.   multivitamin with minerals Tabs tablet Take 1 tablet by mouth daily.   pantoprazole  40 MG tablet Commonly known as: PROTONIX  Take 1 tablet (40 mg total) by mouth at bedtime.   polyethylene glycol powder 17 GM/SCOOP powder Commonly known as: GLYCOLAX /MIRALAX  Dissolve 1 capful (17g) in 4-8 ounces of liquid and take by mouth daily.   senna 8.6 MG Tabs tablet Commonly known as: SENOKOT Take 1 tablet (8.6 mg total) by mouth daily.        Follow-up Information     Dettinger, Fonda LABOR, MD Follow up.   Specialties:  Family Medicine, Cardiology Why: Call in 1-2 days for post hospital follow up Contact information: [address] [place] [address] [phone]         Carilyn Prentice [name], MD Follow up.   Specialty: Physical Medicine and Rehabilitation Why: office will call you with follow up appointment Contact information: [address] [place] [address] [phone]         [facility] Follow up.   Why: office will call you with follow up appointment Contact information: [address]     [address] [place]  [address] [phone]                Signed: Sharlet GORMAN Schmitz 11/20/2024, 3:30 PM

## 2024-11-19 ENCOUNTER — Telehealth: Payer: Self-pay

## 2024-11-19 ENCOUNTER — Telehealth: Payer: Self-pay | Admitting: Family Medicine

## 2024-11-19 NOTE — Telephone Encounter (Signed)
 Copied from CRM #[id number]. Topic: Clinical - Home Health Verbal Orders >> Nov 19, 2024 11:12 AM Graeme ORN wrote: Caller/Agency: Sonny Gaba [facility] (Provider) Callback Number: [phone] Service Requested: Physical Therapy Frequency: 1 week 1, 2 week 1, 1 week 4 Any new concerns about the patient? No

## 2024-11-19 NOTE — Telephone Encounter (Signed)
 Contacted Malori, OT with [facility] and gave verbal orders for occupational health 1week5 per office protocol.

## 2024-11-19 NOTE — Telephone Encounter (Signed)
 Contacted Leslie at [facility]. I gave verbal orders for PT, 1week1, 2week1, and 1week4 per office protocol

## 2024-11-19 NOTE — Telephone Encounter (Signed)
 Copied from CRM #[id number]. Topic: Clinical - Home Health Verbal Orders >> Nov 19, 2024  1:50 PM Emylou G wrote: Caller/Agency: Malori w/[facility]  Callback Number: [phone] secured line Service Requested: Occupational Therapy Frequency: 1w5 Any new concerns about the patient? No

## 2024-11-20 DIAGNOSIS — K5900 Constipation, unspecified: Secondary | ICD-10-CM | POA: Insufficient documentation

## 2024-11-21 ENCOUNTER — Inpatient Hospital Stay: Admitting: Nurse Practitioner

## 2024-11-22 ENCOUNTER — Telehealth: Payer: Self-pay | Admitting: Family Medicine

## 2024-11-22 NOTE — Telephone Encounter (Signed)
 Copied from CRM #[id number]. Topic: Clinical - Home Health Verbal Orders >> Nov 22, 2024  8:45 AM Graeme ORN wrote: Caller/Agency:  Kristine Merilee Gaba (Provider) Callback Number: [phone] Service Requested: Speech Therapy Frequency: 1 time per week for 8 weeks  Any new concerns about the patient? No

## 2024-11-22 NOTE — Telephone Encounter (Signed)
 Contacted [employer] [facility]. I gave verbal orders for speech therapy 1week8 per office protocol.

## 2024-11-27 ENCOUNTER — Ambulatory Visit: Admitting: Nurse Practitioner

## 2024-11-27 ENCOUNTER — Ambulatory Visit: Payer: Self-pay | Admitting: Nurse Practitioner

## 2024-11-27 ENCOUNTER — Encounter: Payer: Self-pay | Admitting: Nurse Practitioner

## 2024-11-27 VITALS — BP 98/67 | HR 100 | Temp 97.9°F | Ht 69.0 in | Wt 168.0 lb

## 2024-11-27 DIAGNOSIS — Z09 Encounter for follow-up examination after completed treatment for conditions other than malignant neoplasm: Secondary | ICD-10-CM | POA: Diagnosis not present

## 2024-11-27 DIAGNOSIS — R32 Unspecified urinary incontinence: Secondary | ICD-10-CM | POA: Insufficient documentation

## 2024-11-27 DIAGNOSIS — B1009 Other human herpesvirus encephalitis: Secondary | ICD-10-CM | POA: Diagnosis not present

## 2024-11-27 DIAGNOSIS — R399 Unspecified symptoms and signs involving the genitourinary system: Secondary | ICD-10-CM | POA: Insufficient documentation

## 2024-11-27 LAB — URINALYSIS, ROUTINE W REFLEX MICROSCOPIC
Glucose, UA: NEGATIVE
Leukocytes,UA: NEGATIVE
Nitrite, UA: NEGATIVE
Specific Gravity, UA: 1.025 (ref 1.005–1.030)
Urobilinogen, Ur: 1 mg/dL (ref 0.2–1.0)
pH, UA: 6 (ref 5.0–7.5)

## 2024-11-27 LAB — MICROSCOPIC EXAMINATION
Bacteria, UA: NONE SEEN
RBC, Urine: NONE SEEN /HPF (ref 0–2)
Renal Epithel, UA: NONE SEEN /HPF
Yeast, UA: NONE SEEN

## 2024-11-27 MED ORDER — SULFAMETHOXAZOLE-TRIMETHOPRIM 800-160 MG PO TABS: 1.0000 | ORAL_TABLET | Freq: Two times a day (BID) | ORAL | 0 refills | Status: AC

## 2024-11-27 NOTE — Progress Notes (Signed)
 "    Subjective:  Patient ID: [name], male    DOB: Aug 03, 1961, 63 y.o.   MRN: [id number]  Patient Care Team: Dettinger, Fonda LABOR, MD as PCP - General (Family Medicine)   Chief Complaint:  Hospitalization Follow-up Orelia to hospital over a month ago after passing out dx meningitis )   HPI: [name] is a 63 y.o. male presenting on 11/27/2024 for Hospitalization Follow-up (Went to hospital over a month ago after passing out dx meningitis )   Discussed the use of AI scribe software for clinical note transcription with the patient, who gave verbal consent to proceed.  History of Present Illness [name] is a 63 year old male who presents for follow-up after hospitalization for sepsis and encephalitis.  He was recently hospitalized for sepsis and encephalitis. He received treatment in the hospital and has since been discharged. Currently, he is undergoing physical, speech, and occupational therapy at home to aid in his recovery.  He experiences memory and cognitive issues following his illness, which his caregiver describes as 'not doing good.' He requires reminders for basic activities such as using the bathroom at night. He is now using adult incontinence products due to new-onset urinary incontinence, although he remains continent of bowel movements.  He has been experiencing tremors on his right side, particularly noticeable in the morning, which are being addressed in physical therapy. His physical recovery is progressing, as he is able to dress himself and shower with minimal assistance.  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  His license is suspended according to [place]  law has to be seizure-free for 6 months before his license can be reinstated   CT Head 10/21/2024 CT head without contrast shows marked progression of edema involving the mesial left temporal lobe and insula, now extending into  the left greater than right frontal lobes. 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 Areas of mild hyperdensity are suspicious for small, non-mass-occupying acute hemorrhage. No extra-axial collection, significant mass effect, midline shift, or definite large territorial infarct identified.  10/18/2024 MRI Brain  MRI brain with contrast shows increased T2 signal in the left mesial temporal lobe without abnormal parenchymal or meningeal enhancement, mass effect, midline shift, hemorrhage, or acute infarct. Findings are consistent with encephalitis. Clinical correlation and follow-up imaging are recommended to ensure resolution.  Relevant past medical, surgical, family, and social history reviewed and updated as indicated.  Allergies and medications reviewed and updated. Data reviewed: Chart in Epic.   Past Medical History:  Diagnosis Date   Arthritis    Arthritis of lumbar spine    AVN (avascular necrosis of bone) (HCC)    Left hip   Calcaneus fracture, left    Cancer (HCC)    Skin cancer Left Arm   GERD (gastroesophageal reflux disease)    Small bowel obstruction (HCC)     Past Surgical History:  Procedure Laterality Date   FOOT SURGERY     HAND SURGERY Right    contracture of hand   INGUINAL HERNIA REPAIR Right 11/23/2013   Procedure: HERNIA REPAIR INGUINAL ADULT;  Surgeon: Elsie GORMAN Holland, MD;  Location: [facility];  Service: General;  Laterality: Right;  site-inguinal area   OPEN REDUCTION, INTERNAL FIXATION (ORIF) CALCANEAL FRACTURE WITH FUSION Left 11/30/2018   Procedure: OPEN REDUCTION, INTERNAL FIXATION (ORIF) CALCANEAL FRACTURE WITH PERONEAL DEBRIDEMENT & REPAIR OF SUPERIOR PERONEAL RETINACULUM;  Surgeon: Elsa,  Lonni SAUNDERS, MD;  Location: [facility];  Service: Orthopedics;  Laterality: Left;   VASECTOMY     WRIST SURGERY Right    otif    Social History   Socioeconomic History   Marital status: Married    Spouse name: Not on  file   Number of children: Not on file   Years of education: Not on file   Highest education level: 9th grade  Occupational History   Not on file  Tobacco Use   Smoking status: Never   Smokeless tobacco: Never  Vaping Use   Vaping status: Never Used  Substance and Sexual Activity   Alcohol use: Yes    Alcohol/week: 4.0 standard drinks of alcohol    Types: 4 Cans of beer per week    Comment: occ   Drug use: No   Sexual activity: Yes    Birth control/protection: None    Comment: married 2005  Other Topics Concern   Not on file  Social History Narrative   Not on file   Social Drivers of Health   Tobacco Use: Low Risk (11/27/2024)   Patient History    Smoking Tobacco Use: Never    Smokeless Tobacco Use: Never    Passive Exposure: Not on file  Financial Resource Strain: Low Risk (09/27/2024)   Overall Financial Resource Strain (CARDIA)    Difficulty of Paying Living Expenses: Not hard at all  Food Insecurity: No Food Insecurity (10/17/2024)   Epic    Worried About Programme Researcher, Broadcasting/film/video in the Last Year: Never true    Ran Out of Food in the Last Year: Never true  Transportation Needs: No Transportation Needs (10/17/2024)   Epic    Lack of Transportation (Medical): No    Lack of Transportation (Non-Medical): No  Physical Activity: Insufficiently Active (09/27/2024)   Exercise Vital Sign    Days of Exercise per Week: 3 days    Minutes of Exercise per Session: 30 min  Stress: No Stress Concern Present (09/27/2024)   [employer] of Occupational Health - Occupational Stress Questionnaire    Feeling of Stress: Not at all  Social Connections: Moderately Isolated (09/27/2024)   Social Connection and Isolation Panel    Frequency of Communication with Friends and Family: More than three times a week    Frequency of Social Gatherings with Friends and Family: Twice a week    Attends Religious Services: Never    Database Administrator or Organizations: No    Attends Museum/gallery Exhibitions Officer: Not on file    Marital Status: Married  Intimate Partner Violence: Patient Unable To Answer (10/17/2024)   Epic    Fear of Current or Ex-Partner: Patient unable to answer    Emotionally Abused: Patient unable to answer    Physically Abused: Patient unable to answer    Sexually Abused: Patient unable to answer  Depression (PHQ2-9): Low Risk (06/13/2024)   Depression (PHQ2-9)    PHQ-2 Score: 1  Alcohol Screen: Low Risk (09/27/2024)   Alcohol Screen    Last Alcohol Screening Score (AUDIT): 3  Housing: Low Risk (10/17/2024)   Epic    Unable to Pay for Housing in the Last Year: No    Number of Times Moved in the Last Year: 0    Homeless in the Last Year: No  Utilities: Not At Risk (10/17/2024)   Epic    Threatened with loss of utilities: No  Health Literacy: Not on file    Outpatient Encounter Medications  as of 11/27/2024  Medication Sig   acetaminophen  (TYLENOL ) 325 MG tablet Take 1-2 tablets (325-650 mg total) by mouth every 4 (four) hours as needed for mild pain (pain score 1-3).   Albuterol -Budesonide  (AIRSUPRA ) 90-80 MCG/ACT AERO Inhale 2 puffs into the lungs as needed (wheezing, shortness of breath). Max of 12 puffs per 24 hours   calcium  citrate (CALCITRATE - DOSED IN MG ELEMENTAL CALCIUM ) 950 (200 Ca) MG tablet Take 1 tablet (950 mg total) by mouth daily with lunch.   famotidine  (PEPCID ) 20 MG tablet Take 1 tablet (20 mg total) by mouth 2 (two) times daily as needed for heartburn or indigestion (breakthrough not controlled by Protonix ).   fluticasone  (FLONASE ) 50 MCG/ACT nasal spray Place 2 sprays into both nostrils daily.   levETIRAcetam  (KEPPRA ) 1000 MG tablet Take 1 tablet (1,000 mg total) by mouth 2 (two) times daily.   Multiple Vitamin (MULTIVITAMIN WITH MINERALS) TABS tablet Take 1 tablet by mouth daily.   pantoprazole  (PROTONIX ) 40 MG tablet Take 1 tablet (40 mg total) by mouth at bedtime.   polyethylene glycol powder (GLYCOLAX /MIRALAX ) 17 GM/SCOOP  powder Dissolve 1 capful (17g) in 4-8 ounces of liquid and take by mouth daily.   senna (SENOKOT) 8.6 MG TABS tablet Take 1 tablet (8.6 mg total) by mouth daily.   sulfamethoxazole -trimethoprim  (BACTRIM  DS) 800-160 MG tablet Take 1 tablet by mouth 2 (two) times daily for 7 days.   No facility-administered encounter medications on file as of 11/27/2024.    Allergies[1]  Pertinent ROS per HPI, otherwise unremarkable      Objective:  BP 98/67   Pulse 100   Temp 97.9 F (36.6 C) (Temporal)   Ht 5' 9 (1.753 m)   Wt 168 lb (76.2 kg)   SpO2 96%   BMI 24.81 kg/m    Wt Readings from Last 3 Encounters:  11/27/24 168 lb (76.2 kg)  11/11/24 170 lb 3.1 oz (77.2 kg)  10/24/24 177 lb 4 oz (80.4 kg)    Physical Exam Vitals and nursing note reviewed.  Constitutional:      Appearance: Normal appearance.  HENT:     Head: Normocephalic and atraumatic.     Right Ear: Tympanic membrane, ear canal and external ear normal. There is no impacted cerumen.     Left Ear: Tympanic membrane, ear canal and external ear normal. There is no impacted cerumen.     Nose: Nose normal.     Mouth/Throat:     Mouth: Mucous membranes are moist.  Eyes:     General: No scleral icterus.    Extraocular Movements: Extraocular movements intact.     Conjunctiva/sclera: Conjunctivae normal.     Pupils: Pupils are equal, round, and reactive to light.  Pulmonary:     Effort: Pulmonary effort is normal.     Breath sounds: Normal breath sounds.  Abdominal:     General: Bowel sounds are normal.     Palpations: Abdomen is soft.  Musculoskeletal:        General: Normal range of motion.     Right lower leg: No edema.     Left lower leg: No edema.  Neurological:     Mental Status: He is alert and oriented to person, place, and time.     Comments: Use a walker for ambulation  Psychiatric:        Mood and Affect: Mood normal.        Behavior: Behavior normal.        Thought Content: Thought  content normal.         Judgment: Judgment normal.    Physical Exam   Urine dipstick shows positive for WBC's trace, positive for protein 2+, positive for bili 1+, and positive for ketones trace.   Micro exam: 0-5 WBC's per HPF and epithelial cells 0-10.   Results for orders placed or performed during the hospital encounter of 10/30/24  Comprehensive metabolic panel   Collection Time: 10/31/24  5:14 AM  Result Value Ref Range   Sodium 135 135 - 145 mmol/L   Potassium 4.1 3.5 - 5.1 mmol/L   Chloride 103 98 - 111 mmol/L   CO2 23 22 - 32 mmol/L   Glucose, Bld 111 (H) 70 - 99 mg/dL   BUN 24 (H) 8 - 23 mg/dL   Creatinine, Ser 8.83 0.61 - 1.24 mg/dL   Calcium  8.4 (L) 8.9 - 10.3 mg/dL   Total Protein 6.0 (L) 6.5 - 8.1 g/dL   Albumin 2.9 (L) 3.5 - 5.0 g/dL   AST 20 15 - 41 U/L   ALT 35 0 - 44 U/L   Alkaline Phosphatase 39 38 - 126 U/L   Total Bilirubin 0.8 0.0 - 1.2 mg/dL   GFR, Estimated >39 >39 mL/min   Anion gap 9 5 - 15  CBC with Differential/Platelet   Collection Time: 10/31/24  5:14 AM  Result Value Ref Range   WBC 10.4 4.0 - 10.5 K/uL   RBC 4.10 (L) 4.22 - 5.81 MIL/uL   Hemoglobin 13.5 13.0 - 17.0 g/dL   HCT 61.3 (L) 60.9 - 47.9 %   MCV 94.1 80.0 - 100.0 fL   MCH 32.9 26.0 - 34.0 pg   MCHC 35.0 30.0 - 36.0 g/dL   RDW 85.0 88.4 - 84.4 %   Platelets 178 150 - 400 K/uL   nRBC 0.0 0.0 - 0.2 %   Neutrophils Relative % 82 %   Neutro Abs 8.6 (H) 1.7 - 7.7 K/uL   Lymphocytes Relative 12 %   Lymphs Abs 1.2 0.7 - 4.0 K/uL   Monocytes Relative 5 %   Monocytes Absolute 0.5 0.1 - 1.0 K/uL   Eosinophils Relative 0 %   Eosinophils Absolute 0.0 0.0 - 0.5 K/uL   Basophils Relative 0 %   Basophils Absolute 0.0 0.0 - 0.1 K/uL   Immature Granulocytes 1 %   Abs Immature Granulocytes 0.09 (H) 0.00 - 0.07 K/uL  Basic metabolic panel   Collection Time: 11/01/24  4:05 AM  Result Value Ref Range   Sodium 134 (L) 135 - 145 mmol/L   Potassium 3.9 3.5 - 5.1 mmol/L   Chloride 102 98 - 111 mmol/L   CO2 25 22 - 32  mmol/L   Glucose, Bld 93 70 - 99 mg/dL   BUN 19 8 - 23 mg/dL   Creatinine, Ser 9.05 0.61 - 1.24 mg/dL   Calcium  8.3 (L) 8.9 - 10.3 mg/dL   GFR, Estimated >39 >39 mL/min   Anion gap 7 5 - 15  CBC   Collection Time: 11/01/24  4:05 AM  Result Value Ref Range   WBC 9.9 4.0 - 10.5 K/uL   RBC 4.01 (L) 4.22 - 5.81 MIL/uL   Hemoglobin 13.4 13.0 - 17.0 g/dL   HCT 60.8 60.9 - 47.9 %   MCV 97.5 80.0 - 100.0 fL   MCH 33.4 26.0 - 34.0 pg   MCHC 34.3 30.0 - 36.0 g/dL   RDW 84.5 88.4 - 84.4 %   Platelets 165 150 -  400 K/uL   nRBC 0.0 0.0 - 0.2 %  Basic metabolic panel   Collection Time: 11/03/24  2:26 AM  Result Value Ref Range   Sodium 136 135 - 145 mmol/L   Potassium 3.6 3.5 - 5.1 mmol/L   Chloride 103 98 - 111 mmol/L   CO2 24 22 - 32 mmol/L   Glucose, Bld 95 70 - 99 mg/dL   BUN 14 8 - 23 mg/dL   Creatinine, Ser 9.14 0.61 - 1.24 mg/dL   Calcium  8.4 (L) 8.9 - 10.3 mg/dL   GFR, Estimated >39 >39 mL/min   Anion gap 9 5 - 15  Prealbumin   Collection Time: 11/05/24  4:32 AM  Result Value Ref Range   Prealbumin 24 18 - 38 mg/dL  Basic metabolic panel   Collection Time: 11/05/24  4:32 AM  Result Value Ref Range   Sodium 135 135 - 145 mmol/L   Potassium 3.7 3.5 - 5.1 mmol/L   Chloride 102 98 - 111 mmol/L   CO2 25 22 - 32 mmol/L   Glucose, Bld 95 70 - 99 mg/dL   BUN 12 8 - 23 mg/dL   Creatinine, Ser 9.13 0.61 - 1.24 mg/dL   Calcium  8.4 (L) 8.9 - 10.3 mg/dL   GFR, Estimated >39 >39 mL/min   Anion gap 8 5 - 15  CBC   Collection Time: 11/05/24  4:32 AM  Result Value Ref Range   WBC 8.6 4.0 - 10.5 K/uL   RBC 3.67 (L) 4.22 - 5.81 MIL/uL   Hemoglobin 12.6 (L) 13.0 - 17.0 g/dL   HCT 63.9 (L) 60.9 - 47.9 %   MCV 98.1 80.0 - 100.0 fL   MCH 34.3 (H) 26.0 - 34.0 pg   MCHC 35.0 30.0 - 36.0 g/dL   RDW 84.4 88.4 - 84.4 %   Platelets 155 150 - 400 K/uL   nRBC 0.0 0.0 - 0.2 %  Basic metabolic panel   Collection Time: 11/08/24  5:22 AM  Result Value Ref Range   Sodium 135 135 - 145 mmol/L    Potassium 3.8 3.5 - 5.1 mmol/L   Chloride 99 98 - 111 mmol/L   CO2 26 22 - 32 mmol/L   Glucose, Bld 95 70 - 99 mg/dL   BUN 20 8 - 23 mg/dL   Creatinine, Ser 8.70 (H) 0.61 - 1.24 mg/dL   Calcium  8.5 (L) 8.9 - 10.3 mg/dL   GFR, Estimated >39 >39 mL/min   Anion gap 10 5 - 15  CBC   Collection Time: 11/08/24  5:22 AM  Result Value Ref Range   WBC 6.8 4.0 - 10.5 K/uL   RBC 3.64 (L) 4.22 - 5.81 MIL/uL   Hemoglobin 12.5 (L) 13.0 - 17.0 g/dL   HCT 64.3 (L) 60.9 - 47.9 %   MCV 97.8 80.0 - 100.0 fL   MCH 34.3 (H) 26.0 - 34.0 pg   MCHC 35.1 30.0 - 36.0 g/dL   RDW 83.6 (H) 88.4 - 84.4 %   Platelets 158 150 - 400 K/uL   nRBC 0.0 0.0 - 0.2 %  Basic metabolic panel   Collection Time: 11/11/24  5:25 AM  Result Value Ref Range   Sodium 137 135 - 145 mmol/L   Potassium 3.7 3.5 - 5.1 mmol/L   Chloride 100 98 - 111 mmol/L   CO2 27 22 - 32 mmol/L   Glucose, Bld 95 70 - 99 mg/dL   BUN 19 8 - 23 mg/dL   Creatinine,  Ser 1.20 0.61 - 1.24 mg/dL   Calcium  8.5 (L) 8.9 - 10.3 mg/dL   GFR, Estimated >39 >39 mL/min   Anion gap 10 5 - 15  Basic metabolic panel   Collection Time: 11/12/24  5:16 AM  Result Value Ref Range   Sodium 140 135 - 145 mmol/L   Potassium 3.7 3.5 - 5.1 mmol/L   Chloride 100 98 - 111 mmol/L   CO2 28 22 - 32 mmol/L   Glucose, Bld 101 (H) 70 - 99 mg/dL   BUN 23 8 - 23 mg/dL   Creatinine, Ser 8.83 0.61 - 1.24 mg/dL   Calcium  8.8 (L) 8.9 - 10.3 mg/dL   GFR, Estimated >39 >39 mL/min   Anion gap 12 5 - 15  CBC   Collection Time: 11/12/24  5:16 AM  Result Value Ref Range   WBC 6.3 4.0 - 10.5 K/uL   RBC 3.74 (L) 4.22 - 5.81 MIL/uL   Hemoglobin 12.9 (L) 13.0 - 17.0 g/dL   HCT 62.5 (L) 60.9 - 47.9 %   MCV 100.0 80.0 - 100.0 fL   MCH 34.5 (H) 26.0 - 34.0 pg   MCHC 34.5 30.0 - 36.0 g/dL   RDW 82.8 (H) 88.4 - 84.4 %   Platelets 187 150 - 400 K/uL   nRBC 0.0 0.0 - 0.2 %  Levetiracetam  level   Collection Time: 11/13/24  9:39 AM  Result Value Ref Range   Levetiracetam  Lvl  37.7 10.0 - 40.0 ug/mL  Urinalysis, w/ Reflex to Culture (Infection Suspected) -Urine, Catheterized   Collection Time: 11/13/24 11:19 AM  Result Value Ref Range   Specimen Source URINE, CATHETERIZED    Color, Urine YELLOW YELLOW   APPearance CLEAR CLEAR   Specific Gravity, Urine 1.015 1.005 - 1.030   pH 6.0 5.0 - 8.0   Glucose, UA NEGATIVE NEGATIVE mg/dL   Hgb urine dipstick NEGATIVE NEGATIVE   Bilirubin Urine NEGATIVE NEGATIVE   Ketones, ur NEGATIVE NEGATIVE mg/dL   Protein, ur NEGATIVE NEGATIVE mg/dL   Nitrite NEGATIVE NEGATIVE   Leukocytes,Ua NEGATIVE NEGATIVE   Squamous Epithelial / HPF 0-5 0 - 5 /HPF   WBC, UA 0-5 0 - 5 WBC/hpf   RBC / HPF 0-5 0 - 5 RBC/hpf   Bacteria, UA RARE (A) NONE SEEN   Mucus PRESENT   Basic metabolic panel   Collection Time: 11/15/24  4:40 AM  Result Value Ref Range   Sodium 136 135 - 145 mmol/L   Potassium 3.7 3.5 - 5.1 mmol/L   Chloride 101 98 - 111 mmol/L   CO2 27 22 - 32 mmol/L   Glucose, Bld 88 70 - 99 mg/dL   BUN 21 8 - 23 mg/dL   Creatinine, Ser 9.12 0.61 - 1.24 mg/dL   Calcium  8.8 (L) 8.9 - 10.3 mg/dL   GFR, Estimated >39 >39 mL/min   Anion gap 8 5 - 15  CBC   Collection Time: 11/15/24  4:40 AM  Result Value Ref Range   WBC 6.3 4.0 - 10.5 K/uL   RBC 4.03 (L) 4.22 - 5.81 MIL/uL   Hemoglobin 14.1 13.0 - 17.0 g/dL   HCT 60.2 60.9 - 47.9 %   MCV 98.5 80.0 - 100.0 fL   MCH 35.0 (H) 26.0 - 34.0 pg   MCHC 35.5 30.0 - 36.0 g/dL   RDW 83.7 (H) 88.4 - 84.4 %   Platelets 244 150 - 400 K/uL   nRBC 0.0 0.0 - 0.2 %  Pertinent labs & imaging results that were available during my care of the patient were reviewed by me and considered in my medical decision making.  Assessment & Plan:  Parvin was seen today for hospitalization follow-up.  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  UTI symptoms -     Urinalysis, Routine w reflex microscopic -     Urine Culture -     sulfamethoxazole -trimethoprim  (BACTRIM  DS) 800-160 MG tablet; Take 1 tablet by mouth 2 (two) times daily for 7 days.  Hospital discharge follow-up     Assessment and Plan Noa is a 63 year old Caucasian male seen today posthospital discharge for encephalitis, no acute distress Assessment & Plan Urinary incontinence Likely due to neurological impairment from previous encephalitis. Bowel continence intact. - Ordered urinalysis to evaluate for urinary tract infection. - Prescribed Bactrim  1 tablet twice daily for 7 days, send urine out for culture based on culture result and antibiotic may be needed - Continue physical therapy, speech therapy, and occupational therapy.  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 Receiving therapy to aid recovery. - Continue physical therapy, speech therapy, and occupational therapy.  -Disability placard form filled out and provided to him to be taken to the [facility]      Continue all other maintenance medications.  Follow up plan: Return for with PCP as alresuled 12/19/2024.   Continue healthy lifestyle choices, including diet (rich in fruits, vegetables, and lean proteins, and low in salt and simple carbohydrates) and exercise (at least 30 minutes of moderate physical activity daily).  Educational handout given for   Clinical References  Viral Encephalitis Viral encephalitis is inflammation of the brain that is caused by a virus. It can result in an illness with flu-like symptoms. Sometimes the illness may be severe and require immediate treatment in a hospital. What are the causes? This condition may be caused by many types of viruses. These viruses can be spread in different ways. You can get a virus by: Breathing in droplets that  have been coughed or sneezed into the air by an infected person. Sharing drinking glasses, eating utensils, or personal items like toothbrushes or lipstick with an infected person. Having contact with infected animals or insects. A bite from a mosquito or tick can pass certain types of viruses that may cause viral encephalitis. What increases the risk? You may be more likely to develop viral encephalitis if your body's defense system (immune system) is weak. This may be the case with: People who have HIV. People who are taking medicines to reduce the strength of the immune system (immunosuppressants). People who are receiving chemotherapy treatments. What are the signs or symptoms? Symptoms of this condition may vary depending on how severe the illness is. Mild symptoms Fever. Muscle aches. Headache. Eye pain caused by light. Stiff neck. Backache. Nausea and vomiting. Severe symptoms Confusion. Unusual drowsiness. Difficulty speaking. Irritability. Clumsiness. Uncontrolled stiffening, twitching, or jerking of your body (seizures). Loss of consciousness (coma). How is this diagnosed? This condition may be diagnosed based on: Your symptoms and medical history. A physical exam. Results of tests, which may include: Blood tests. A test of the fluid that surrounds and protects your brain and spinal cord (cerebrospinal fluid). The fluid is  removed by inserting a needle into your lower back (lumbar puncture). Imaging studies of the brain, such as a CT scan or MRI. How is this treated? Most cases need to be treated in the hospital. Hospitalization is needed to treat symptoms and to watch for complications of this condition. The condition may be treated with: Medicines to treat the infection (antiviral medicines). Medicines to treat symptoms such as seizures, fever, and pain. Fluids given through an IV. These may be given if: Vomiting has led to dehydration. You vomit every time you eat  or drink. Medicines to treat bacterial infections (antibiotics). This may be given for a day or two to make sure that the infection is not from bacteria. Follow these instructions at home:     Take over-the-counter and prescription medicines only as told by your health care provider. Rest in a quiet room while you are recovering. Keep the room dark if you are sensitive to light. Drink enough fluid to keep your urine pale yellow. Take steps to avoid spreading the infection if it can be spread from person to person (is contagious). Wash your hands often with soap and water  for at least 20 seconds. Stay away from other people as much as possible during your recovery. Avoid sharing food, utensils, glasses, and other objects with others. Return to your normal activities as told by your health care provider. Ask your health care provider what activities are safe for you. Keep all follow-up visits. This is important. Where to find more information [employer] of Neurological Disorders and Stroke: [email or web address] Contact a health care provider if: You develop new symptoms. Get help right away if: You have a seizure. You faint or lose consciousness. You have weakness in your arm or leg. You have confusion or memory loss. You have signs of dehydration, including: Increased thirst. Weakness. Drowsiness. Dizziness. Not urinating or only urinating a small amount of very dark urine in 6-8 hours. These symptoms may represent a serious problem that is an emergency. Do not wait to see if the symptoms will go away. Get medical help right away. Call your local emergency services (911 in the [place] Do not drive yourself to the hospital. Summary Viral encephalitis is inflammation of the brain that is caused by a virus. This condition may be caused by many types of viruses. These viruses can be spread from person to person or through contact with infected animals or insects. Symptoms vary  depending on how severe the illness is. They may include fever, headache, muscle aches, and confusion. Treatment includes medicines to treat the infection and medicines to treat symptoms such as seizures, fever, and pain. This information is not intended to replace advice given to you by your health care provider. Make sure you discuss any questions you have with your health care provider. Document Revised: 03/02/2021 Document Reviewed: 03/02/2021 Elsevier Patient Education  2024 [employer] Overactive Bladder in Adults: What to Know  Overactive bladder (OAB) is when you have trouble holding your pee. You may feel the need to pee often or all of a sudden. You may also leak pee if you can't get to a bathroom fast enough. These symptoms may get in the way of your daily life. What are the causes? OAB may be caused by poor nerve signals between your bladder and your brain. Your bladder may get the signal to empty before it's full. Or, your bladder may be too sensitive. This can cause it to squeeze too soon. Other causes include: Medical  problems, such as: A urinary tract infection (UTI). Swelling or stones in your bladder. A tumor, which is a growth of cells that isn't normal. Diabetes. Muscle or nerve weakness. This may be caused by: An injury to your spinal cord. A stroke. Multiple sclerosis. Surgery on nearby tissues. Drinking caffeine or alcohol. Some medicines. These include ones to lower the amount of fluid in your body. Trouble pooping, also called constipation. What increases the risk? You may be more at risk if: You're an older adult. You're going through menopause. You have a big prostate or problems with your prostate. You're overweight. You smoke. You eat or drink things that irritate your bladder. These may include tea or spicy food. What are the signs or symptoms? A sudden, strong need to pee. Peeing 8 or more times a day. Leaking pee. Waking up to pee 2 or more  times a night. How is this diagnosed? You may be diagnosed based on your symptoms, medical history, and an exam. You may also have tests. These may include: Blood tests. Tests of your pee to check for an infection. Tests to check the flow of your pee. These are called urodynamic tests. You may also need to see an expert in problems with the bladder called a urologist. How is this treated? Treatment depends on the cause and how bad your symptoms are. It may include: Bladder training, such as: Learning to control the urge to pee by peeing at certain times. Doing Kegel exercises. These can help you strengthen the muscles that start and stop the flow of pee. Special devices, such as: Biofeedback. This uses sensors to help you learn when to pee. Electrical stimulation. This uses electricity to make the nerves and muscles of your bladder work. A pessary. This can be put in the vagina to support the bladder. Medicines to: Treat a UTI. Stop the bladder from releasing pee at the wrong time. Calm the bladder muscles. Surgery to: Help the nerve signals that control peeing. Change the shape of your bladder. This is done only in very bad cases. Follow these instructions at home: Eating and drinking  Make changes to your diet as told. You may need to: Drink small amounts of fluid during the day rather than large amounts at one time. Take in less caffeine or alcohol. Take steps to help treat or prevent trouble pooping. You may need to eat foods high in fiber, like beans, whole grains, and fresh fruits and vegetables. Lifestyle Lose weight if needed. Do not smoke, vape, or use nicotine or tobacco. General instructions Take your medicines only as told. If you were given antibiotics, take them as told. Do not stop taking them even if you start to feel better. Use devices as told. If needed, wear pads to absorb pee leakage. Keep track of how much you drink, when you drink, and when you need to  pee. Contact a health care provider if: Your symptoms don't get better with treatment. You can't control your bladder. You have a fever or chills. This information is not intended to replace advice given to you by your health care provider. Make sure you discuss any questions you have with your health care provider. Document Revised: 06/20/2023 Document Reviewed: 06/20/2023 Elsevier Patient Education  2024 [employer]  The above assessment and management plan was discussed with the patient. The patient verbalized understanding of and has agreed to the management plan. Patient is aware to call the clinic if they develop any new symptoms or if  symptoms persist or worsen. Patient is aware when to return to the clinic for a follow-up visit. Patient educated on when it is appropriate to go to the emergency department.    Nena Cassis Morton Hummer, DNP [facility] [address] [place], [place] [address] [phone]       [1]  Allergies Allergen Reactions   Bee Venom Rash   Morphine And Codeine Swelling   "

## 2024-12-14 ENCOUNTER — Other Ambulatory Visit (HOSPITAL_COMMUNITY): Payer: Self-pay

## 2024-12-14 ENCOUNTER — Other Ambulatory Visit: Payer: Self-pay | Admitting: Family Medicine

## 2024-12-14 MED ORDER — LEVETIRACETAM 1000 MG PO TABS
1000.0000 mg | ORAL_TABLET | Freq: Two times a day (BID) | ORAL | 0 refills | Status: AC
  Filled 2024-12-14 – 2024-12-15 (×2): qty 60, 30d supply, fill #0

## 2024-12-14 MED ORDER — PANTOPRAZOLE SODIUM 40 MG PO TBEC
40.0000 mg | DELAYED_RELEASE_TABLET | Freq: Every day | ORAL | 0 refills | Status: DC
  Filled 2024-12-14: qty 30, 30d supply, fill #0

## 2024-12-14 MED ORDER — SENNA 8.6 MG PO TABS
1.0000 | ORAL_TABLET | Freq: Every day | ORAL | 0 refills | Status: DC
  Filled 2024-12-14: qty 30, 30d supply, fill #0

## 2024-12-14 NOTE — Telephone Encounter (Signed)
 Patient was prescribed KEPPRA , PANTOPRAZOLE , and SENOKOT while at [facility]  Was seen by Nena Hummer on 12/23 for a HFU.   Patient requesting refill. Will be out tomorrow. Can covering provider advise on this?

## 2024-12-14 NOTE — Telephone Encounter (Signed)
 Copied from CRM [id number]. Topic: Clinical - Medication Refill >> Dec 14, 2024  8:08 AM Rea C wrote: Medication: levETIRAcetam  (KEPPRA ) 1000 MG tablet, pantoprazole  (PROTONIX ) 40 MG tablet, senna (SENOKOT) 8.6 MG TABS tablet   Has the patient contacted their pharmacy? Yes and told to [facility]  (Agent: If no, request that the patient contact the pharmacy for the refill. If patient does not wish to contact the pharmacy document the reason why and proceed with request.) (Agent: If yes, when and what did the pharmacy advise?)  This is the patient's preferred pharmacy:   [facility] [address] [place] [address] Phone: [phone] Fax: [phone]  Is this the correct pharmacy for this prescription? Yes If no, delete pharmacy and type the correct one.   Has the prescription been filled recently? Yes  Is the patient out of the medication? Yes  Has the patient been seen for an appointment in the last year OR does the patient have an upcoming appointment? Yes  Can we respond through MyChart? Yes  Agent: Please be advised that Rx refills may take up to 3 business days. We ask that you follow-up with your pharmacy.

## 2024-12-15 ENCOUNTER — Other Ambulatory Visit (HOSPITAL_COMMUNITY): Payer: Self-pay

## 2024-12-19 ENCOUNTER — Encounter: Payer: Self-pay | Admitting: Family Medicine

## 2024-12-24 ENCOUNTER — Ambulatory Visit

## 2024-12-24 DIAGNOSIS — M720 Palmar fascial fibromatosis [Dupuytren]: Secondary | ICD-10-CM | POA: Diagnosis not present

## 2024-12-24 DIAGNOSIS — F419 Anxiety disorder, unspecified: Secondary | ICD-10-CM

## 2024-12-24 DIAGNOSIS — R569 Unspecified convulsions: Secondary | ICD-10-CM | POA: Diagnosis not present

## 2024-12-24 DIAGNOSIS — K56609 Unspecified intestinal obstruction, unspecified as to partial versus complete obstruction: Secondary | ICD-10-CM

## 2024-12-24 DIAGNOSIS — M5136 Other intervertebral disc degeneration, lumbar region with discogenic back pain only: Secondary | ICD-10-CM

## 2024-12-24 DIAGNOSIS — M87852 Other osteonecrosis, left femur: Secondary | ICD-10-CM | POA: Diagnosis not present

## 2024-12-24 DIAGNOSIS — K219 Gastro-esophageal reflux disease without esophagitis: Secondary | ICD-10-CM

## 2024-12-24 DIAGNOSIS — J9601 Acute respiratory failure with hypoxia: Secondary | ICD-10-CM | POA: Diagnosis not present

## 2024-12-24 DIAGNOSIS — F32A Depression, unspecified: Secondary | ICD-10-CM

## 2024-12-24 DIAGNOSIS — B004 Herpesviral encephalitis: Secondary | ICD-10-CM | POA: Diagnosis not present

## 2024-12-24 DIAGNOSIS — M1711 Unilateral primary osteoarthritis, right knee: Secondary | ICD-10-CM

## 2024-12-27 ENCOUNTER — Ambulatory Visit: Payer: Self-pay

## 2024-12-27 NOTE — Telephone Encounter (Signed)
 FYI Only or Action Required?: FYI only for provider: appointment scheduled on 12/28/24.  Patient was last seen in primary care on 11/27/2024 by Deitra Morton Sebastian Nena, NP.  Called Nurse Triage reporting Fatigue.  Symptoms began a week ago.  Interventions attempted: Rest, hydration, or home remedies.  Symptoms are: gradually worsening.  Triage Disposition: See Physician Within 24 Hours  Patient/caregiver understands and will follow disposition?: Yes                Message from Alfonso ORN sent at 12/27/2024 12:38 PM EST  Summary: extreme fatigue   Reason for Triage: [name] pina) is calling on behalf of patient and she is on the dpr Patient experiencing extreme fatigue         Reason for Disposition  [1] MODERATE weakness (e.g., interferes with work, school, normal activities) AND [2] persists > 3 days  Answer Assessment - Initial Assessment Questions 1. DESCRIPTION: Describe how you are feeling.     Doesn't have any energy, can hardly get up and do anything, run down.  2. SEVERITY: How bad is it?  Can you stand and walk?     Moderate.  3. ONSET: When did these symptoms begin? (e.g., hours, days, weeks, months)     Meningitis in November caused moderate fatigue, unable to do normal activities. Last week has been worsening.  4. CAUSE: What do you think is causing the weakness or fatigue? (e.g., not drinking enough fluids, medical problem, trouble sleeping)     Wonder if UTI is back, had a couple weeks ago.  5. NEW MEDICINES:  Have you started on any new medicines recently? (e.g., opioid pain medicines, benzodiazepines, muscle relaxants, antidepressants, antihistamines, neuroleptics, beta blockers)     No.  6. OTHER SYMPTOMS: Do you have any other symptoms? (e.g., chest pain, fever, cough, SOB, vomiting, diarrhea, bleeding, other areas of pain)     Memory loss over the past week.  No difficulty breathing, fainting, chest pain, palpitations,  chest pain, bleeding, black tarry stool, dehydration  Protocols used: Weakness (Generalized) and Fatigue-A-AH

## 2024-12-28 ENCOUNTER — Ambulatory Visit: Admitting: Family Medicine

## 2024-12-28 ENCOUNTER — Encounter: Payer: Self-pay | Admitting: Family Medicine

## 2024-12-28 VITALS — BP 112/79 | HR 84 | Ht 69.0 in | Wt 172.0 lb

## 2024-12-28 DIAGNOSIS — R5383 Other fatigue: Secondary | ICD-10-CM

## 2024-12-28 DIAGNOSIS — R413 Other amnesia: Secondary | ICD-10-CM

## 2024-12-28 LAB — URINALYSIS, COMPLETE
Bilirubin, UA: NEGATIVE
Glucose, UA: NEGATIVE
Ketones, UA: NEGATIVE
Leukocytes,UA: NEGATIVE
Nitrite, UA: NEGATIVE
Protein,UA: NEGATIVE
RBC, UA: NEGATIVE
Specific Gravity, UA: 1.02 (ref 1.005–1.030)
Urobilinogen, Ur: 0.2 mg/dL (ref 0.2–1.0)
pH, UA: 5.5 (ref 5.0–7.5)

## 2024-12-28 LAB — MICROSCOPIC EXAMINATION
Epithelial Cells (non renal): NONE SEEN /HPF (ref 0–10)
RBC, Urine: NONE SEEN /HPF (ref 0–2)
Yeast, UA: NONE SEEN

## 2024-12-28 MED ORDER — PANTOPRAZOLE SODIUM 40 MG PO TBEC: 40.0000 mg | DELAYED_RELEASE_TABLET | Freq: Every day | ORAL | 0 refills | Status: AC

## 2024-12-28 MED ORDER — CEFTRIAXONE SODIUM 1 G IJ SOLR
1.0000 g | Freq: Once | INTRAMUSCULAR | Status: AC
  Administered 2024-12-28: 1 g via INTRAMUSCULAR

## 2024-12-28 NOTE — Progress Notes (Signed)
 "  BP 112/79   Pulse 84   Ht 5' 9 (1.753 m)   Wt 172 lb (78 kg)   SpO2 97%   BMI 25.40 kg/m    Subjective:   Patient ID: [name], male    DOB: Jun 08, 1961, 64 y.o.   MRN: [id number]  HPI: [name] is a 64 y.o. male presenting on 12/28/2024 for Fatigue, memory changes, and Tremors (In hands and pain)   Discussed the use of AI scribe software for clinical note transcription with the patient, who gave verbal consent to proceed.  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 He is accompanied by his wife.  Fatigue and memory impairment - Increased fatigue and memory issues over the past week, with recent worsening of symptoms - Struggling with recovery since hospitalization for herpes viral encephalitis a couple of months ago - Initially after discharge, did not require walker and was doing well - Currently experiences variable memory recall and 'real bad shaking spells'  Neurological symptoms - History of herpes viral encephalitis - Experiencing 'real bad shaking spells' - Variable memory recall  Urinary tract infection concerns - Concerned about possible recurrence of urinary tract infection - Body aches, occasional low-grade fevers, chills, and intermittent feeling cold - No burning during urination, no hematuria, no consistent fever - Has not been taking temperature regularly  Medication use - Continues seizure medication, recently refilled - Uses Miralax  - Taking Glycine for protein supplementation          Relevant past medical, surgical, family and social history reviewed and updated as indicated. Interim medical history since our last visit reviewed. Allergies and medications reviewed and updated.  Review of Systems  Constitutional:  Positive for fatigue. Negative for chills and fever.  Eyes:  Negative for visual disturbance.  Respiratory:  Negative  for shortness of breath and wheezing.   Cardiovascular:  Negative for chest pain and leg swelling.  Musculoskeletal:  Negative for back pain and gait problem.  Skin:  Negative for rash.  Neurological:  Negative for dizziness and light-headedness.  Psychiatric/Behavioral:  Positive for confusion.   All other systems reviewed and are negative.   Per HPI unless specifically indicated above   Allergies as of 12/28/2024       Reactions   Bee Venom Rash   Morphine And Codeine Swelling        Medication List        Accurate as of December 28, 2024  9:12 AM. If you have any questions, ask your nurse or doctor.          acetaminophen  325 MG tablet Commonly known as: TYLENOL  Take 1-2 tablets (325-650 mg total) by mouth every 4 (four) hours as needed for mild pain (pain score 1-3).   Airsupra  90-80 MCG/ACT Aero Generic drug: Albuterol -Budesonide  Inhale 2 puffs into the lungs as needed (wheezing, shortness of breath). Max of 12 puffs per 24 hours   calcium  citrate 950 (200 Ca) MG tablet Commonly known as: CALCITRATE - dosed in mg elemental calcium  Take 1 tablet (950 mg total) by mouth daily with lunch.   famotidine  20 MG tablet Commonly known as: Pepcid  Take 1 tablet (20 mg total) by mouth 2 (two) times daily as needed for heartburn or indigestion (breakthrough not controlled by Protonix ).   fluticasone  50 MCG/ACT nasal spray Commonly known as: FLONASE  Place 2 sprays into both nostrils  daily.   levETIRAcetam  1000 MG tablet Commonly known as: KEPPRA  Take 1 tablet (1,000 mg total) by mouth 2 (two) times daily.   multivitamin with minerals Tabs tablet Take 1 tablet by mouth daily.   pantoprazole  40 MG tablet Commonly known as: PROTONIX  Take 1 tablet (40 mg total) by mouth at bedtime.   polyethylene glycol powder 17 GM/SCOOP powder Commonly known as: GLYCOLAX /MIRALAX  Dissolve 1 capful (17g) in 4-8 ounces of liquid and take by mouth daily.   senna 8.6 MG Tabs  tablet Commonly known as: SENOKOT Take 1 tablet (8.6 mg total) by mouth daily.         Objective:   BP 112/79   Pulse 84   Ht 5' 9 (1.753 m)   Wt 172 lb (78 kg)   SpO2 97%   BMI 25.40 kg/m   Wt Readings from Last 3 Encounters:  12/28/24 172 lb (78 kg)  11/27/24 168 lb (76.2 kg)  11/11/24 170 lb 3.1 oz (77.2 kg)    Physical Exam Physical Exam   CHEST: Lungs clear to auscultation bilaterally. CARDIOVASCULAR: Heart sounds normal. ABDOMEN: Mild costovertebral angle tenderness bilaterally.  No abdominal tenderness.  Could be lower back pain send of CVA pain.        Assessment & Plan:   Problem List Items Addressed This Visit   None Visit Diagnoses       Memory changes    -  Primary   Relevant Orders   Urinalysis, Complete   Urine Culture     Other fatigue       Relevant Orders   Urinalysis, Complete   Urine Culture           Cognitive impairment following herpesviral encephalitis Cognitive impairment persists with recent memory issues and fatigue. Urinalysis suggests no UTI. - Encouraged hydration. - Instructed to monitor for fever and take temperature when symptomatic.  Fatigue secondary to recent illness Fatigue worsened, possibly related to recent illness and cognitive impairment. No acute infection from urinalysis. - Encouraged nutritional intake with emphasis on protein-rich foods.  Nutritional support and weight loss Ongoing weight loss with focus on nutritional support. Glycine supplement in use. Discussed dietary modifications to increase protein intake. - Continue Glycine supplement. - Encouraged dietary modifications to increase protein intake. - Discontinued Senna if not needed. - Continue Miralax  as needed.   Concern for CVA tenderness on exam.  Urinalysis does not look like a UTI but with CVA tenderness and being elderly and been that it is a long weekend coming up we will go ahead and give 1 g Rocephin  IM      Follow up plan: Return if  symptoms worsen or fail to improve.  Counseling provided for all of the vaccine components Orders Placed This Encounter  Procedures   Urine Culture   Urinalysis, Complete    Fonda Levins, MD [facility] 12/28/2024, 9:12 AM     "

## 2025-01-01 ENCOUNTER — Encounter: Admitting: Physical Medicine & Rehabilitation

## 2025-01-02 ENCOUNTER — Ambulatory Visit: Payer: Self-pay | Admitting: Family Medicine

## 2025-01-02 LAB — URINE CULTURE

## 2025-01-02 MED ORDER — LEVOFLOXACIN 500 MG PO TABS: 500.0000 mg | ORAL_TABLET | Freq: Every day | ORAL | 0 refills | Status: AC

## 2025-01-25 ENCOUNTER — Ambulatory Visit: Admitting: Diagnostic Neuroimaging

## 2025-02-07 ENCOUNTER — Encounter: Admitting: Family Medicine

## 2025-02-07 ENCOUNTER — Encounter: Admitting: Physical Medicine & Rehabilitation
